# Patient Record
Sex: Female | Born: 1945
Health system: Southern US, Community
[De-identification: ages and names within clinical notes are randomized; demographics above are authoritative.]

## PROBLEM LIST (undated history)

## (undated) DIAGNOSIS — I739 Peripheral vascular disease, unspecified: Secondary | ICD-10-CM

## (undated) DIAGNOSIS — E785 Hyperlipidemia, unspecified: Secondary | ICD-10-CM

## (undated) DIAGNOSIS — E079 Disorder of thyroid, unspecified: Secondary | ICD-10-CM

## (undated) DIAGNOSIS — I2510 Atherosclerotic heart disease of native coronary artery without angina pectoris: Secondary | ICD-10-CM

## (undated) DIAGNOSIS — S32010A Wedge compression fracture of first lumbar vertebra, initial encounter for closed fracture: Secondary | ICD-10-CM

## (undated) DIAGNOSIS — I509 Heart failure, unspecified: Secondary | ICD-10-CM

## (undated) DIAGNOSIS — I6529 Occlusion and stenosis of unspecified carotid artery: Secondary | ICD-10-CM

## (undated) DIAGNOSIS — D126 Benign neoplasm of colon, unspecified: Secondary | ICD-10-CM

## (undated) DIAGNOSIS — K449 Diaphragmatic hernia without obstruction or gangrene: Secondary | ICD-10-CM

## (undated) DIAGNOSIS — D649 Anemia, unspecified: Secondary | ICD-10-CM

## (undated) DIAGNOSIS — B3781 Candidal esophagitis: Secondary | ICD-10-CM

## (undated) DIAGNOSIS — R0902 Hypoxemia: Secondary | ICD-10-CM

## (undated) DIAGNOSIS — I255 Ischemic cardiomyopathy: Secondary | ICD-10-CM

## (undated) DIAGNOSIS — R0600 Dyspnea, unspecified: Secondary | ICD-10-CM

## (undated) DIAGNOSIS — K648 Other hemorrhoids: Secondary | ICD-10-CM

## (undated) DIAGNOSIS — J4489 Other specified chronic obstructive pulmonary disease: Secondary | ICD-10-CM

## (undated) DIAGNOSIS — I219 Acute myocardial infarction, unspecified: Secondary | ICD-10-CM

## (undated) DIAGNOSIS — N3941 Urge incontinence: Secondary | ICD-10-CM

## (undated) DIAGNOSIS — J449 Chronic obstructive pulmonary disease, unspecified: Secondary | ICD-10-CM

## (undated) DIAGNOSIS — I5022 Chronic systolic (congestive) heart failure: Secondary | ICD-10-CM

## (undated) DIAGNOSIS — I10 Essential (primary) hypertension: Secondary | ICD-10-CM

## (undated) DIAGNOSIS — Z86010 Personal history of colonic polyps: Secondary | ICD-10-CM

## (undated) DIAGNOSIS — I480 Paroxysmal atrial fibrillation: Secondary | ICD-10-CM

## (undated) DIAGNOSIS — K222 Esophageal obstruction: Secondary | ICD-10-CM

## (undated) HISTORY — DX: Chronic systolic (congestive) heart failure: I50.22

## (undated) HISTORY — DX: Paroxysmal atrial fibrillation: I48.0

## (undated) HISTORY — DX: Chronic obstructive pulmonary disease, unspecified: J44.9

## (undated) HISTORY — DX: Urge incontinence: N39.41

## (undated) HISTORY — DX: Hypoxemia: R09.02

## (undated) HISTORY — DX: Essential (primary) hypertension: I10

## (undated) HISTORY — DX: Esophageal obstruction: K22.2

## (undated) HISTORY — DX: Atherosclerotic heart disease of native coronary artery without angina pectoris: I25.10

## (undated) HISTORY — DX: Anemia, unspecified: D64.9

## (undated) HISTORY — DX: Candidal esophagitis: B37.81

## (undated) HISTORY — DX: Wedge compression fracture of first lumbar vertebra, initial encounter for closed fracture: S32.010A

## (undated) HISTORY — DX: Benign neoplasm of colon, unspecified: D12.6

## (undated) HISTORY — DX: Heart failure, unspecified: I50.9

## (undated) HISTORY — DX: Hyperlipidemia, unspecified: E78.5

## (undated) HISTORY — DX: Ischemic cardiomyopathy: I25.5

## (undated) HISTORY — DX: Diaphragmatic hernia without obstruction or gangrene: K44.9

## (undated) HISTORY — DX: Other specified chronic obstructive pulmonary disease: J44.89

## (undated) HISTORY — DX: Other hemorrhoids: K64.8

## (undated) HISTORY — DX: Occlusion and stenosis of unspecified carotid artery: I65.29

## (undated) HISTORY — DX: Personal history of colonic polyps: Z86.010

## (undated) HISTORY — PX: BAND HEMORRHOIDECTOMY: SHX1213

## (undated) HISTORY — PX: APPENDECTOMY: SHX54

## (undated) HISTORY — PX: OTHER SURGICAL HISTORY: SHX169

---

## 1974-01-28 HISTORY — PX: ABDOMINAL HYSTERECTOMY: SHX81

## 2007-01-29 DIAGNOSIS — I219 Acute myocardial infarction, unspecified: Secondary | ICD-10-CM

## 2007-01-29 HISTORY — DX: Acute myocardial infarction, unspecified: I21.9

## 2008-10-14 ENCOUNTER — Ambulatory Visit: Payer: Self-pay | Admitting: Diagnostic Radiology

## 2008-10-14 ENCOUNTER — Ambulatory Visit: Payer: Self-pay | Admitting: Cardiology

## 2008-10-14 ENCOUNTER — Encounter: Payer: Self-pay | Admitting: Emergency Medicine

## 2008-10-14 ENCOUNTER — Inpatient Hospital Stay (HOSPITAL_COMMUNITY): Admission: EM | Admit: 2008-10-14 | Discharge: 2008-10-19 | Payer: Self-pay | Admitting: Cardiology

## 2008-10-15 ENCOUNTER — Encounter: Payer: Self-pay | Admitting: Cardiology

## 2008-10-24 ENCOUNTER — Encounter: Payer: Self-pay | Admitting: Cardiology

## 2008-10-28 DIAGNOSIS — E876 Hypokalemia: Secondary | ICD-10-CM | POA: Insufficient documentation

## 2008-10-28 DIAGNOSIS — I429 Cardiomyopathy, unspecified: Secondary | ICD-10-CM

## 2008-10-28 DIAGNOSIS — I2510 Atherosclerotic heart disease of native coronary artery without angina pectoris: Secondary | ICD-10-CM | POA: Insufficient documentation

## 2008-10-28 DIAGNOSIS — I255 Ischemic cardiomyopathy: Secondary | ICD-10-CM | POA: Insufficient documentation

## 2008-11-02 ENCOUNTER — Encounter: Payer: Self-pay | Admitting: Cardiology

## 2008-11-02 ENCOUNTER — Ambulatory Visit: Payer: Self-pay | Admitting: Cardiology

## 2008-11-02 ENCOUNTER — Encounter (INDEPENDENT_AMBULATORY_CARE_PROVIDER_SITE_OTHER): Payer: Self-pay | Admitting: *Deleted

## 2008-11-02 DIAGNOSIS — E7800 Pure hypercholesterolemia, unspecified: Secondary | ICD-10-CM | POA: Insufficient documentation

## 2008-11-02 DIAGNOSIS — D649 Anemia, unspecified: Secondary | ICD-10-CM | POA: Insufficient documentation

## 2008-11-02 DIAGNOSIS — F17200 Nicotine dependence, unspecified, uncomplicated: Secondary | ICD-10-CM | POA: Insufficient documentation

## 2008-11-29 ENCOUNTER — Encounter: Payer: Self-pay | Admitting: Cardiology

## 2008-11-30 ENCOUNTER — Encounter (INDEPENDENT_AMBULATORY_CARE_PROVIDER_SITE_OTHER): Payer: Self-pay | Admitting: *Deleted

## 2008-11-30 LAB — CONVERTED CEMR LAB
ALT: <8 units/L (ref 0–35)
AST: 9 units/L (ref 0–37)
Albumin: 4.1 g/dL (ref 3.5–5.2)
Alkaline Phosphatase: 37 units/L — ABNORMAL LOW (ref 39–117)
BUN: 7 mg/dL (ref 6–23)
Bilirubin, Direct: 0.2 mg/dL (ref 0.0–0.3)
CO2: 31 meq/L (ref 19–32)
Calcium: 9.6 mg/dL (ref 8.4–10.5)
Chloride: 103 meq/L (ref 96–112)
Cholesterol: 131 mg/dL (ref 0–200)
Creatinine, Ser: 0.81 mg/dL (ref 0.40–1.20)
Glucose, Bld: 86 mg/dL (ref 70–99)
HDL: 40 mg/dL (ref >39)
Indirect Bilirubin: 0.5 mg/dL (ref 0.0–0.9)
LDL Cholesterol: 72 mg/dL (ref 0–99)
Potassium: 4.1 meq/L (ref 3.5–5.3)
Sodium: 142 meq/L (ref 135–145)
Total Bilirubin: 0.7 mg/dL (ref 0.3–1.2)
Total CHOL/HDL Ratio: 3.3
Total Protein: 7.6 g/dL (ref 6.0–8.3)
Triglycerides: 94 mg/dL (ref <150)
VLDL: 19 mg/dL (ref 0–40)

## 2008-12-14 ENCOUNTER — Encounter: Payer: Self-pay | Admitting: Cardiology

## 2008-12-14 ENCOUNTER — Ambulatory Visit: Payer: Self-pay | Admitting: Cardiology

## 2008-12-21 ENCOUNTER — Encounter: Payer: Self-pay | Admitting: Cardiology

## 2008-12-26 LAB — CONVERTED CEMR LAB
BUN: 10 mg/dL (ref 6–23)
CO2: 25 meq/L (ref 19–32)
Calcium: 9.5 mg/dL (ref 8.4–10.5)
Chloride: 106 meq/L (ref 96–112)
Creatinine, Ser: 0.81 mg/dL (ref 0.40–1.20)
Glucose, Bld: 85 mg/dL (ref 70–99)
Potassium: 4.7 meq/L (ref 3.5–5.3)
Sodium: 143 meq/L (ref 135–145)

## 2009-02-15 ENCOUNTER — Encounter (INDEPENDENT_AMBULATORY_CARE_PROVIDER_SITE_OTHER): Payer: Self-pay | Admitting: *Deleted

## 2009-02-23 ENCOUNTER — Telehealth: Payer: Self-pay | Admitting: Cardiology

## 2009-03-22 ENCOUNTER — Encounter: Payer: Self-pay | Admitting: Cardiology

## 2009-03-22 ENCOUNTER — Ambulatory Visit: Payer: Self-pay | Admitting: Cardiology

## 2009-03-28 ENCOUNTER — Encounter: Payer: Self-pay | Admitting: Cardiology

## 2009-03-29 ENCOUNTER — Encounter (INDEPENDENT_AMBULATORY_CARE_PROVIDER_SITE_OTHER): Payer: Self-pay | Admitting: *Deleted

## 2009-03-29 LAB — CONVERTED CEMR LAB
ALT: <8 units/L (ref 0–35)
AST: 13 units/L (ref 0–37)
Albumin: 3.8 g/dL (ref 3.5–5.2)
Alkaline Phosphatase: 34 units/L — ABNORMAL LOW (ref 39–117)
BUN: 8 mg/dL (ref 6–23)
Bilirubin, Direct: 0.2 mg/dL (ref 0.0–0.3)
CO2: 31 meq/L (ref 19–32)
Calcium: 9.1 mg/dL (ref 8.4–10.5)
Chloride: 104 meq/L (ref 96–112)
Cholesterol: 125 mg/dL (ref 0–200)
Creatinine, Ser: 0.76 mg/dL (ref 0.40–1.20)
Glucose, Bld: 78 mg/dL (ref 70–99)
HDL: 40 mg/dL (ref >39)
Indirect Bilirubin: 0.5 mg/dL (ref 0.0–0.9)
LDL Cholesterol: 71 mg/dL (ref 0–99)
Potassium: 4.2 meq/L (ref 3.5–5.3)
Sodium: 143 meq/L (ref 135–145)
Total Bilirubin: 0.7 mg/dL (ref 0.3–1.2)
Total CHOL/HDL Ratio: 3.1
Total Protein: 7.3 g/dL (ref 6.0–8.3)
Triglycerides: 69 mg/dL (ref <150)
VLDL: 14 mg/dL (ref 0–40)

## 2009-04-11 ENCOUNTER — Ambulatory Visit: Payer: Self-pay | Admitting: Cardiology

## 2009-04-11 ENCOUNTER — Encounter: Payer: Self-pay | Admitting: Cardiology

## 2009-04-11 ENCOUNTER — Ambulatory Visit: Payer: Self-pay

## 2009-04-11 ENCOUNTER — Ambulatory Visit (HOSPITAL_COMMUNITY): Admission: RE | Admit: 2009-04-11 | Discharge: 2009-04-11 | Payer: Self-pay | Admitting: Cardiology

## 2009-05-25 ENCOUNTER — Ambulatory Visit: Payer: Self-pay | Admitting: Internal Medicine

## 2009-05-25 DIAGNOSIS — I5022 Chronic systolic (congestive) heart failure: Secondary | ICD-10-CM

## 2009-05-25 DIAGNOSIS — I5032 Chronic diastolic (congestive) heart failure: Secondary | ICD-10-CM | POA: Insufficient documentation

## 2009-09-20 ENCOUNTER — Ambulatory Visit: Payer: Self-pay | Admitting: Cardiology

## 2009-09-20 ENCOUNTER — Encounter: Payer: Self-pay | Admitting: Cardiology

## 2009-09-20 DIAGNOSIS — J441 Chronic obstructive pulmonary disease with (acute) exacerbation: Secondary | ICD-10-CM | POA: Insufficient documentation

## 2009-09-20 DIAGNOSIS — J449 Chronic obstructive pulmonary disease, unspecified: Secondary | ICD-10-CM | POA: Insufficient documentation

## 2009-09-27 ENCOUNTER — Encounter: Payer: Self-pay | Admitting: Cardiology

## 2009-09-29 ENCOUNTER — Encounter (INDEPENDENT_AMBULATORY_CARE_PROVIDER_SITE_OTHER): Payer: Self-pay | Admitting: *Deleted

## 2009-09-29 LAB — CONVERTED CEMR LAB
ALT: <8 units/L (ref 0–35)
AST: 11 units/L (ref 0–37)
Albumin: 3.7 g/dL (ref 3.5–5.2)
Alkaline Phosphatase: 37 units/L — ABNORMAL LOW (ref 39–117)
BUN: 5 mg/dL — ABNORMAL LOW (ref 6–23)
Bilirubin, Direct: 0.2 mg/dL (ref 0.0–0.3)
CO2: 33 meq/L — ABNORMAL HIGH (ref 19–32)
Calcium: 9 mg/dL (ref 8.4–10.5)
Chloride: 100 meq/L (ref 96–112)
Cholesterol: 113 mg/dL (ref 0–200)
Creatinine, Ser: 0.73 mg/dL (ref 0.40–1.20)
Glucose, Bld: 80 mg/dL (ref 70–99)
HDL: 32 mg/dL — ABNORMAL LOW (ref >39)
Indirect Bilirubin: 0.5 mg/dL (ref 0.0–0.9)
LDL Cholesterol: 66 mg/dL (ref 0–99)
Potassium: 4 meq/L (ref 3.5–5.3)
Sodium: 143 meq/L (ref 135–145)
Total Bilirubin: 0.7 mg/dL (ref 0.3–1.2)
Total CHOL/HDL Ratio: 3.5
Total Protein: 7.1 g/dL (ref 6.0–8.3)
Triglycerides: 76 mg/dL (ref <150)
VLDL: 15 mg/dL (ref 0–40)

## 2010-02-27 NOTE — Letter (Signed)
Summary: Hydrographic surveyor at [facility]  [address]   [place], [place] [address]   Phone: [phone]  Fax:      November 30, 2008 MRN: [id number]   [facility] [address] [place], [place]  [address]   Dear Tonya Harper,  We have reviewed your cholesterol results.  They are as follows:     Total Cholesterol:    131 (Desirable: less than 200)       HDL  Cholesterol:     40  (Desirable: greater than 40 for men and 50 for women)       LDL Cholesterol:       72  (Desirable: less than 100 for low risk and less than 70 for moderate to high risk)       Triglycerides:       94  (Desirable: less than 150)  Our recommendations include:These numbers look good. Continue on the same medicine. Sodium, potassium kidney and Liver function are normal. Take care, Dr. Darel Hong.    Call our office at the number listed above if you have any questions.  Lowering your LDL cholesterol is important, but it is only one of a large number of "risk factors" that may indicate that you are at risk for heart disease, stroke or other complications of hardening of the arteries.  Other risk factors include:   A.  Cigarette Smoking* B.  High Blood Pressure* C.  Obesity* D.   Low HDL Cholesterol (see yours above)* E.   Diabetes Mellitus (higher risk if your is uncontrolled) F.  Family history of premature heart disease G.  Previous history of stroke or cardiovascular disease    *These are risk factors YOU HAVE CONTROL OVER.  For more information, visit .  There is now evidence that lowering the TOTAL CHOLESTEROL AND LDL CHOLESTEROL can reduce the risk of heart disease.  The American Heart Association recommends the following guidelines for the treatment of elevated cholesterol:  1.  If there is now current heart disease and less than two risk factors, TOTAL CHOLESTEROL should be less than 200 and LDL CHOLESTEROL should be less than 100. 2.  If there is current heart  disease or two or more risk factors, TOTAL CHOLESTEROL should be less than 200 and LDL CHOLESTEROL should be less than 70.  A diet low in cholesterol, saturated fat, and calories is the cornerstone of treatment for elevated cholesterol.  Cessation of smoking and exercise are also important in the management of elevated cholesterol and preventing vascular disease.  Studies have shown that 30 to 60 minutes of physical activity most days can help lower blood pressure, lower cholesterol, and keep your weight at a healthy level.  Drug therapy is used when cholesterol levels do not respond to therapeutic lifestyle changes (smoking cessation, diet, and exercise) and remains unacceptably high.  If medication is started, it is important to have you levels checked periodically to evaluate the need for further treatment options.  Thank you,    [employer] Team

## 2010-02-27 NOTE — Letter (Signed)
Summary: Custom - Lipid  [facility], Main Office  [address]   [place], [place] [address]   Phone: [phone]  Fax: [phone]     September 29, 2009 MRN: [id number]   [facility] [address] [place], [place]  [address]   Dear Tonya Harper,  We have reviewed your cholesterol results.  They are as follows:     Total Cholesterol:    113 (Desirable: less than 200)       HDL  Cholesterol:     32  (Desirable: greater than 40 for men and 50 for women)       LDL Cholesterol:       66  (Desirable: less than 100 for low risk and less than 70 for moderate to high risk)       Triglycerides:       76  (Desirable: less than 150)  Our recommendations include:These numbers look good. Continue on the same medicine. Sodium, potassium, kidney and Liver function are normal. Take care, Dr. Darel Hong.    Call our office at the number listed above if you have any questions.  Lowering your LDL cholesterol is important, but it is only one of a large number of "risk factors" that may indicate that you are at risk for heart disease, stroke or other complications of hardening of the arteries.  Other risk factors include:   A.  Cigarette Smoking* B.  High Blood Pressure* C.  Obesity* D.   Low HDL Cholesterol (see yours above)* E.   Diabetes Mellitus (higher risk if your is uncontrolled) F.  Family history of premature heart disease G.  Previous history of stroke or cardiovascular disease    *These are risk factors YOU HAVE CONTROL OVER.  For more information, visit .  There is now evidence that lowering the TOTAL CHOLESTEROL AND LDL CHOLESTEROL can reduce the risk of heart disease.  The American Heart Association recommends the following guidelines for the treatment of elevated cholesterol:  1.  If there is now current heart disease and less than two risk factors, TOTAL CHOLESTEROL should be less than 200 and LDL CHOLESTEROL should be less than 100. 2.  If there  is current heart disease or two or more risk factors, TOTAL CHOLESTEROL should be less than 200 and LDL CHOLESTEROL should be less than 70.  A diet low in cholesterol, saturated fat, and calories is the cornerstone of treatment for elevated cholesterol.  Cessation of smoking and exercise are also important in the management of elevated cholesterol and preventing vascular disease.  Studies have shown that 30 to 60 minutes of physical activity most days can help lower blood pressure, lower cholesterol, and keep your weight at a healthy level.  Drug therapy is used when cholesterol levels do not respond to therapeutic lifestyle changes (smoking cessation, diet, and exercise) and remains unacceptably high.  If medication is started, it is important to have you levels checked periodically to evaluate the need for further treatment options.  Thank you,    [employer] Team

## 2010-02-27 NOTE — Letter (Signed)
Summary: Custom - Lipid  [facility], Main Office  [address]   [place], [place] [address]   Phone: [phone]  Fax: [phone]     March 29, 2009 MRN: [id number]   [facility] [address] [place], [place]  [address]   Dear Ms. Kotter,  We have reviewed your cholesterol results.  They are as follows:     Total Cholesterol:    125 (Desirable: less than 200)       HDL  Cholesterol:     40  (Desirable: greater than 40 for men and 50 for women)       LDL Cholesterol:       71  (Desirable: less than 100 for low risk and less than 70 for moderate to high risk)       Triglycerides:       69  (Desirable: less than 150)  Our recommendations include:These numbers look good. Continue on the same medicine. Sodium, potassium, kidney and Liver function are normal. Take care, Dr. Darel Hong.    Call our office at the number listed above if you have any questions.  Lowering your LDL cholesterol is important, but it is only one of a large number of "risk factors" that may indicate that you are at risk for heart disease, stroke or other complications of hardening of the arteries.  Other risk factors include:   A.  Cigarette Smoking* B.  High Blood Pressure* C.  Obesity* D.   Low HDL Cholesterol (see yours above)* E.   Diabetes Mellitus (higher risk if your is uncontrolled) F.  Family history of premature heart disease G.  Previous history of stroke or cardiovascular disease    *These are risk factors YOU HAVE CONTROL OVER.  For more information, visit .  There is now evidence that lowering the TOTAL CHOLESTEROL AND LDL CHOLESTEROL can reduce the risk of heart disease.  The American Heart Association recommends the following guidelines for the treatment of elevated cholesterol:  1.  If there is now current heart disease and less than two risk factors, TOTAL CHOLESTEROL should be less than 200 and LDL CHOLESTEROL should be less than 100. 2.  If there is  current heart disease or two or more risk factors, TOTAL CHOLESTEROL should be less than 200 and LDL CHOLESTEROL should be less than 70.  A diet low in cholesterol, saturated fat, and calories is the cornerstone of treatment for elevated cholesterol.  Cessation of smoking and exercise are also important in the management of elevated cholesterol and preventing vascular disease.  Studies have shown that 30 to 60 minutes of physical activity most days can help lower blood pressure, lower cholesterol, and keep your weight at a healthy level.  Drug therapy is used when cholesterol levels do not respond to therapeutic lifestyle changes (smoking cessation, diet, and exercise) and remains unacceptably high.  If medication is started, it is important to have you levels checked periodically to evaluate the need for further treatment options.  Thank you,    [employer] Team

## 2010-02-27 NOTE — Progress Notes (Signed)
Summary: refill  Phone Note Refill Request Message from:  Patient on February 23, 2009 10:33 AM  Refills Requested: Medication #1:  CARVEDILOL 6.25 MG TABS Take one tablet by mouth twice a day   Supply Requested: 3 months   Notes: 90 day supply [facility], please resend with 90 day supply   Method Requested: Fax to Local Pharmacy Initial call taken by: Migdalia Dk,  February 23, 2009 10:35 AM  Follow-up for Phone Call        Called Pharm. Ms. Buttram has 3 refills on hold. I called her home and left a message to pick up her scripts. Follow-up by: Oswald Hillock,  February 23, 2009 11:38 AM

## 2010-02-27 NOTE — Assessment & Plan Note (Signed)
Summary: [facility]   Visit Type:  2 months follow up  CC:  No complains.  History of Present Illness: Pleasant female with coronary artery disease and cardiomyopathy.  A cardiac catheterization was performed on September 21 of 2010. This revealed left main with 50% eccentric distal stenosis, left anterior descending coronary artery 60% mid tubular lesion (does not appear to be flow limiting), circumflex occluded in its mid portion and distal circ appears to fill from septal collaterals, right coronary artery appeared to be a small and likely nondominant, subtotally occluded in the mid vessel with faint filling of the distal vessel.  Left ventricular ejection fraction in the 40% range.  Medical management recommended. An echocardiogram during that admission revealed an ejection fraction of 20-25%, mild aortic and mitral regurgitation and right ventricular enlargement. She also had a normocytic anemia. I last saw her in November of 2010. Since then the patient has dyspnea with more extreme activities but not with routine activities. It is relieved with rest. It is not associated with chest pain. There is no orthopnea, PND or pedal edema. There is no syncope or palpitations. There is no exertional chest pain.   Current Medications (verified): 1)  Ferrous Sulfate 325 (65 Fe) Mg Tabs (Ferrous Sulfate) .... Take 1 Tablet By Mouth Once A Day 2)  Nitrostat 0.4 Mg Subl (Nitroglycerin) .Marland Kitchen.. 1 Tablet Under Tongue At Onset of Chest Pain; You May Repeat Every 5 Minutes For Up To 3 Doses. 3)  Tylenol 325 Mg Tabs (Acetaminophen) .... As Needed 4)  Aspirin 81 Mg Tbec (Aspirin) .... Take One Tablet By Mouth Daily 5)  Carvedilol 6.25 Mg Tabs (Carvedilol) .... Take One Tablet By Mouth Twice A Day 6)  Lisinopril 20 Mg Tabs (Lisinopril) .... Take One and One Half Tablet By Mouth Once Daily 7)  Pravastatin Sodium 20 Mg Tabs (Pravastatin Sodium) .... Take One Tablet By Mouth Daily At Bedtime  Allergies: 1)  !  * Asa High Doses  Past History:  Past Medical History: Current Problems:  CARDIOMYOPATHY (ICD-425.4) CAD (ICD-414.00) Hyperlipidemia Anemia with H and H at a low of 9.6 and 29.   Past Surgical History: Reviewed history from 10/28/2008 and no changes required.  Hysterectomy,  benign tumor removed from her  back.      Social History: Reviewed history from 10/28/2008 and no changes required.  The patient lives with her son.  She has been smoking   for 35 years and currently smokes a half-pack per day.  She rarely   drinks alcohol.   Review of Systems       no fevers or chills, productive cough, hemoptysis, dysphasia, odynophagia, melena, hematochezia, dysuria, hematuria, rash, seizure activity, orthopnea, PND, pedal edema, claudication. Remaining systems are negative.   Vital Signs:  Patient profile:   65 year old female Height:      64 inches Weight:      81.75 pounds BMI:     14.08 Pulse rate:   62 / minute Pulse rhythm:   regular Resp:     18 per minute BP sitting:   124 / 64  (left arm) Cuff size:   regular  Vitals Entered By: Vikki Ports (March 22, 2009 9:50 AM)  Physical Exam  General:  Well-developed well-nourished in no acute distress.  Skin is warm and dry.  HEENT is normal.  Neck is supple. No thyromegaly.  Chest is clear to auscultation with normal expansion.  Cardiovascular exam is regular rate and rhythm.  Abdominal exam nontender  or distended. No masses palpated. Extremities show no edema. neuro grossly intact    EKG  Procedure date:  03/22/2009  Findings:      Sinus rhythm at a rate of 61. Axis normal. Prolonged QT interval. Inferior T-wave inversion.  Impression & Recommendations:  Problem # 1:  CARDIOMYOPATHY (ICD-425.4) I will plan to increase her lisinopril to 40 mg p.o. daily. Continue Coreg. Check bmet in one week. Repeat echocardiogram in 6 weeks. If ejection fraction less than 35% will need ICD. Her updated medication list  for this problem includes:    Nitrostat 0.4 Mg Subl (Nitroglycerin) .Marland Kitchen... 1 tablet under tongue at onset of chest pain; you may repeat every 5 minutes for up to 3 doses.    Aspirin 81 Mg Tbec (Aspirin) .Marland Kitchen... Take one tablet by mouth daily    Carvedilol 6.25 Mg Tabs (Carvedilol) .Marland Kitchen... Take one tablet by mouth twice a day    Lisinopril 20 Mg Tabs (Lisinopril) .Marland Kitchen... Take one and one half tablet by mouth once daily  Problem # 2:  TOBACCO ABUSE (ICD-305.1) Patient counseled on discontinuing for between 3-10 minutes.  Problem # 3:  PURE HYPERCHOLESTEROLEMIA (ICD-272.0) Continue statin. Check lipids and liver. Goal LDL less than 70. Her updated medication list for this problem includes:    Pravastatin Sodium 20 Mg Tabs (Pravastatin sodium) .Marland Kitchen... Take one tablet by mouth daily at bedtime  Problem # 4:  CAD (ICD-414.00) Continue aspirin, beta blocker, ACE inhibitor and statin. Her updated medication list for this problem includes:    Nitrostat 0.4 Mg Subl (Nitroglycerin) .Marland Kitchen... 1 tablet under tongue at onset of chest pain; you may repeat every 5 minutes for up to 3 doses.    Aspirin 81 Mg Tbec (Aspirin) .Marland Kitchen... Take one tablet by mouth daily    Carvedilol 6.25 Mg Tabs (Carvedilol) .Marland Kitchen... Take one tablet by mouth twice a day    Lisinopril 40 Mg Tabs (Lisinopril) .Marland Kitchen... Take one tablet by mouth daily  Problem # 5:  OTHER SPECIFIED ANEMIAS (ICD-285.8) I have asked her to follow up with her primary care physician for further evaluation of this. She states this is a chronic issue.  Other Orders: Echocardiogram (Echo)  Patient Instructions: 1)  Your physician recommends that you schedule a follow-up appointment in: 6 MONTHS 2)  Your physician recommends that you return for lab work in:ONE WEEK 3)  Your physician has requested that you have an echocardiogram.  Echocardiography is a painless test that uses sound waves to create images of your heart. It provides your doctor with information about the size  and shape of your heart and how well your heart's chambers and valves are working.  This procedure takes approximately one hour. There are no restrictions for this procedure. 4)  Your physician has recommended you make the following change in your medication: INCREASE LISINOPRIL 40MG  ONCE DAILY Prescriptions: LISINOPRIL 40 MG TABS (LISINOPRIL) Take one tablet by mouth daily  #90 x 4   Entered by:   Deliah Goody, RN   Authorized by:   Ferman Hamming, MD, [facility]   Signed by:   Deliah Goody, RN on 03/22/2009   Method used:   [facility] to        [facility] [phone][facility] (retail)       [address]       [place], [place]  [address]       Ph: [phone] or [phone]  Fax: [phone]   RxID:   [id number] LISINOPRIL 40 MG TABS (LISINOPRIL) Take one tablet by mouth daily  #30 x 12   Entered by:   Deliah Goody, RN   Authorized by:   Ferman Hamming, MD, [facility]   Signed by:   Deliah Goody, RN on 03/22/2009   Method used:   [facility]ron[facility] to        [facility] [phone]* (retail)       [address]       [place], [place]  [address]       Ph: [phone] or [phone]       Fax: [phone]   RxID:   [id number]

## 2010-02-27 NOTE — Assessment & Plan Note (Signed)
Summary: [facility]   Visit Type:  6 weeks follow up  CC:  No complains.  History of Present Illness: 65 year old female with coronary artery disease and cardiomyopathy.  A cardiac catheterization was performed on September 21 of 2010. This revealed left main with 50% eccentric distal stenosis, left anterior descending coronary artery 60% mid tubular lesion (does not appear to be flow limiting), circumflex occluded in its mid portion and distal circ appears to fill from septal collaterals, right coronary artery appeared to be a small and likely nondominant, subtotally occluded in the mid vessel with faint filling of the distal vessel.  Left ventricular ejection fraction in the 40% range.  Medical management recommended. An echocardiogram during that admission revealed an ejection fraction of 20-25%, mild aortic and mitral regurgitation and right ventricular enlargement. She also had a normocytic anemia. I last saw her in early October. Since then she has dyspnea with more extreme activities but not with routine activities. It is relieved with rest. It is not associated with chest pain. There is no orthopnea, PND, pedal edema, palpitations, syncope or exertional chest pain.  Current Medications (verified): 1)  Ferrous Sulfate 325 (65 Fe) Mg Tabs (Ferrous Sulfate) .... Take 1 Tablet By Mouth Once A Day 2)  Nitrostat 0.4 Mg Subl (Nitroglycerin) .Marland Kitchen.. 1 Tablet Under Tongue At Onset of Chest Pain; You May Repeat Every 5 Minutes For Up To 3 Doses. 3)  Tylenol 325 Mg Tabs (Acetaminophen) .... As Needed 4)  Aspirin 81 Mg Tbec (Aspirin) .... Take One Tablet By Mouth Daily 5)  Carvedilol 3.125 Mg Tabs (Carvedilol) .... Take One Tablet By Mouth Twice A Day 6)  Lisinopril 20 Mg Tabs (Lisinopril) .... Take One Tablet By Mouth Daily 7)  Pravastatin Sodium 20 Mg Tabs (Pravastatin Sodium) .... Take One Tablet By Mouth Daily At Bedtime  Allergies (verified): 1)  ! * Asa High Doses  Past History:  Past  Medical History: Current Problems:  CARDIOMYOPATHY (ICD-425.4) CAD (ICD-414.00) Hyperlipidemia  Bronchitis, treated with IV azithromycin.  Anemia with H and H at a low of 9.6 and 29.   Past Surgical History: Reviewed history from 10/28/2008 and no changes required.  Hysterectomy,  benign tumor removed from her  back.      Social History: Reviewed history from 10/28/2008 and no changes required.  The patient lives with her son.  She has been smoking   for 35 years and currently smokes a half-pack per day.  She rarely   drinks alcohol.   Review of Systems       no fevers or chills, productive cough, hemoptysis, dysphasia, odynophagia, melena, hematochezia, dysuria, hematuria, rash, seizure activity, orthopnea, PND, pedal edema, claudication. Remaining systems are negative.   Vital Signs:  Patient profile:   64 year old female Height:      64 inches Weight:      77 pounds BMI:     13.26 Pulse rate:   64 / minute Pulse rhythm:   regular Resp:     18 per minute BP sitting:   130 / 70  (right arm) Cuff size:   regular  Vitals Entered By: Vikki Ports (December 14, 2008 10:41 AM)  Physical Exam  General:  well-developed frail in no acute distress. Skin is warm and dry. Head:  HEENT is normal. Neck:  supple with no bruits. No thyromegaly. Lungs:  diminished breath sounds throughout Heart:  regular rate and rhythm Abdomen:  soft and nontender. No masses palpated. Extremities:  no edema. Neurologic:  grossly intact.   Impression & Recommendations:  Problem # 1:  TOBACCO ABUSE (ICD-305.1) Patient counseled as continuing for between 3-10 minutes.  Problem # 2:  PURE HYPERCHOLESTEROLEMIA (ICD-272.0) Continue statin. Her updated medication list for this problem includes:    Pravastatin Sodium 20 Mg Tabs (Pravastatin sodium) .Marland Kitchen... Take one tablet by mouth daily at bedtime  Problem # 3:  CARDIOMYOPATHY (ICD-425.4) Plan will continue to titrate medications. Increase  lisinopril to 30 mg p.o. daily and Coreg to 6.25 mg p.o. b.i.d. I will continue to increase medications and once fully titrated we will plan to repeat echocardiogram. Check bmet in one week after increasing lisinopril. Her updated medication list for this problem includes:    Nitrostat 0.4 Mg Subl (Nitroglycerin) .Marland Kitchen... 1 tablet under tongue at onset of chest pain; you may repeat every 5 minutes for up to 3 doses.    Aspirin 81 Mg Tbec (Aspirin) .Marland Kitchen... Take one tablet by mouth daily    Carvedilol 6.25 Mg Tabs (Carvedilol) .Marland Kitchen... Take one tablet by mouth twice a day    Lisinopril 20 Mg Tabs (Lisinopril) .Marland Kitchen... Take one and one half tablet by mouth once daily  Problem # 5:  CAD (ICD-414.00) Continue aspirin, beta blocker, ACE inhibitor and statin. Her updated medication list for this problem includes:    Nitrostat 0.4 Mg Subl (Nitroglycerin) .Marland Kitchen... 1 tablet under tongue at onset of chest pain; you may repeat every 5 minutes for up to 3 doses.    Aspirin 81 Mg Tbec (Aspirin) .Marland Kitchen... Take one tablet by mouth daily    Carvedilol 6.25 Mg Tabs (Carvedilol) .Marland Kitchen... Take one tablet by mouth twice a day    Lisinopril 20 Mg Tabs (Lisinopril) .Marland Kitchen... Take one and one half tablet by mouth once daily  Problem # 6:  OTHER SPECIFIED ANEMIAS (ICD-285.8) Management per primary care.  Patient Instructions: 1)  Your physician recommends that you schedule a follow-up appointment in: 8 WEEKS 2)  Your physician recommends that you return for lab work in:ONE WEEK 3)  Your physician has recommended you make the following change in your medication: INCREASE LISINOPRIL 20MG  TAKE ONE AND ONE HALF TABLET ONCE DAILY 4)  INCREASE CARVEDILOL 6.25MG  ONE TABLET TWICE DAILY Prescriptions: PRAVASTATIN SODIUM 20 MG TABS (PRAVASTATIN SODIUM) Take one tablet by mouth daily at bedtime  #90 x 3   Entered by:   Deliah Goody, RN   Authorized by:   Ferman Hamming, MD, [facility]   Signed by:   Deliah Goody, RN on 12/14/2008   Method used:    [facility]ron[facility] to        [facility] [phone]* (retail)       [address]       [place], [place]  [address]       Ph: [phone] or [phone]       Fax: [phone]   RxID:   [id number] CARVEDILOL 6.25 MG TABS (CARVEDILOL) Take one tablet by mouth twice a day  #60 x 12   Entered by:   Deliah Goody, RN   Authorized by:   Ferman Hamming, MD, [facility]   Signed by:   Deliah Goody, RN on 12/14/2008   Method used:   Electronically to        [facility] [phone]* (retail)       [address]       [facility]       [place], [place]  [address]       Ph: [phone] or [phone]       Fax: [phone]   RxID:   [id number] LISINOPRIL 20 MG TABS (LISINOPRIL) Take one and one half tablet by mouth once daily  #45 x 12   Entered by:   Deliah Goody, RN   Authorized by:   Ferman Hamming, MD, [facility]   Signed by:   Deliah Goody, RN on 12/14/2008   Method used:   [facility]ronically to        [facility] [phone]* (retail)       [address]       [place], [place]  [address]       Ph: [phone] or [phone]       Fax: [phone]   RxID:   [id number]

## 2010-02-27 NOTE — Assessment & Plan Note (Signed)
Summary: ec6/EF%20-25/discuss ICD/dm   Visit Type:  Follow-up Primary Provider:  Dr. Lawerance Bach  CC:  fatigue with exertion.  History of Present Illness: Tonya Harper is referred today by Dr. Jens Som for evaluation and consideration for ICD implant.  She has a h/o CAD and LV dysfunction and class 2 CHF.  The patient has never had frank syncope but she does not dyspnea with exertion without peripheral edema.  No other complaints.  She still smokes.  She has tried to stop.   Current Medications (verified): 1)  Ferrous Sulfate 325 (65 Fe) Mg Tabs (Ferrous Sulfate) .... Take 1 Tablet By Mouth Once A Day 2)  Nitrostat 0.4 Mg Subl (Nitroglycerin) .Marland Kitchen.. 1 Tablet Under Tongue At Onset of Chest Pain; You May Repeat Every 5 Minutes For Up To 3 Doses. 3)  Tylenol 325 Mg Tabs (Acetaminophen) .... As Needed 4)  Aspirin 81 Mg Tbec (Aspirin) .... Take One Tablet By Mouth Daily 5)  Carvedilol 6.25 Mg Tabs (Carvedilol) .... Take One Tablet By Mouth Twice A Day 6)  Lisinopril 40 Mg Tabs (Lisinopril) .... Take One Tablet By Mouth Daily 7)  Pravastatin Sodium 20 Mg Tabs (Pravastatin Sodium) .... Take One Tablet By Mouth Daily At Bedtime  Allergies (verified): 1)  ! * Asa High Doses  Past History:  Past Medical History: Last updated: 03/22/2009 Current Problems:  CARDIOMYOPATHY (ICD-425.4) CAD (ICD-414.00) Hyperlipidemia Anemia with H and H at a low of 9.6 and 29.   Past Surgical History: Last updated: 10/28/2008  Hysterectomy,  benign tumor removed from her  back.      Family History: Last updated: 10/28/2008 remarkable for her father dying suddenly, presumably   of a myocardial infarction at age 63.  Two brothers have had coronary   disease at age 1.   Social History: Last updated: 10/28/2008  The patient lives with her son.  She has been smoking   for 35 years and currently smokes a half-pack per day.  She rarely   drinks alcohol.   Review of Systems       All systems reviewed and  negative except as noted in the HPI.  Vital Signs:  Patient profile:   65 year old female Height:      64 inches Weight:      81 pounds BMI:     13.95 Pulse rate:   68 / minute BP sitting:   154 / 70  Vitals Entered By: Laurance Flatten CMA (May 25, 2009 10:00 AM) CC: fatigue with exertion Comments pt has had cough since September about same time fatigue started.   Physical Exam  General:  Well-developed well-nourished in no acute distress.  Skin is warm and dry.  HEENT is normal.  Neck is supple. No thyromegaly.  Chest is clear to auscultation with normal expansion.  Cardiovascular exam is regular rate and rhythm.  Abdominal exam nontender or distended. No masses palpated. Extremities show no edema. neuro grossly intact    EKG  Procedure date:  03/22/2009  Findings:      Normal sinus rhythm with rate of:64.  Left ventricular hypertrophy.    Impression & Recommendations:  Problem # 1:  CAD (ICD-414.00) The patient denies c/p and will continue her current meds. Her updated medication list for this problem includes:    Nitrostat 0.4 Mg Subl (Nitroglycerin) .Marland Kitchen... 1 tablet under tongue at onset of chest pain; you may repeat every 5 minutes for up to 3 doses.    Aspirin 81 Mg Tbec (Aspirin) .Marland KitchenMarland KitchenMarland KitchenMarland Kitchen  Take one tablet by mouth daily    Carvedilol 6.25 Mg Tabs (Carvedilol) .Marland Kitchen... Take one tablet by mouth twice a day    Lisinopril 40 Mg Tabs (Lisinopril) .Marland Kitchen... Take one tablet by mouth daily  Problem # 2:  CHRONIC SYSTOLIC HEART FAILURE (ICD-428.22) Her symptoms are class 2 and along with  her EF of 25%, I would recommend proceeding with prophylactic ICD insertion. Her updated medication list for this problem includes:    Nitrostat 0.4 Mg Subl (Nitroglycerin) .Marland Kitchen... 1 tablet under tongue at onset of chest pain; you may repeat every 5 minutes for up to 3 doses.    Aspirin 81 Mg Tbec (Aspirin) .Marland Kitchen... Take one tablet by mouth daily    Carvedilol 6.25 Mg Tabs (Carvedilol) .Marland Kitchen... Take one  tablet by mouth twice a day    Lisinopril 40 Mg Tabs (Lisinopril) .Marland Kitchen... Take one tablet by mouth daily

## 2010-02-27 NOTE — Assessment & Plan Note (Signed)
Summary: [facility]   Visit Type:  Follow-up Primary Provider:  Dr. Lawerance Bach  CC:  No complains.  History of Present Illness: Pleasant female with coronary artery disease and cardiomyopathy.  A cardiac catheterization was performed on September 21 of 2010. This revealed left main with 50% eccentric distal stenosis, left anterior descending coronary artery 60% mid tubular lesion (does not appear to be flow limiting), circumflex occluded in its mid portion and distal circ appears to fill from septal collaterals, right coronary artery appeared to be a small and likely nondominant, subtotally occluded in the mid vessel with faint filling of the distal vessel.  Left ventricular ejection fraction in the 40% range.  Medical management recommended. An echocardiogram during that admission revealed an ejection fraction of 20-25%, mild aortic and mitral regurgitation and right ventricular enlargement. She also had a normocytic anemia. Repeat echocardiogram in March of 2011 following titration of medications revealed an ejection fraction of 20-25% and mild to moderate aortic insufficiency. She was seen by Dr. Ladona Ridgel and ICD recommended. I last saw her in Feb 2011. Since then the patient has dyspnea with more extreme activities but not with routine activities. It is relieved with rest. It is not associated with chest pain. There is no orthopnea, PND or pedal edema. There is no syncope or palpitations. There is no exertional chest pain. She did have an episode of what sounds to be bronchitis for approximately one week. She had a productive cough and continuous chest tightness for that period.   Current Medications (verified): 1)  Ferrous Sulfate 325 (65 Fe) Mg Tabs (Ferrous Sulfate) .... Take 1 Tablet By Mouth Once A Day 2)  Nitrostat 0.4 Mg Subl (Nitroglycerin) .Marland Kitchen.. 1 Tablet Under Tongue At Onset of Chest Pain; You May Repeat Every 5 Minutes For Up To 3 Doses. 3)  Tylenol 325 Mg Tabs (Acetaminophen) .... As  Needed 4)  Aspirin 81 Mg Tbec (Aspirin) .... Take One Tablet By Mouth Daily 5)  Carvedilol 6.25 Mg Tabs (Carvedilol) .... Take One Tablet By Mouth Twice A Day 6)  Lisinopril 40 Mg Tabs (Lisinopril) .... Take One Tablet By Mouth Daily 7)  Pravastatin Sodium 20 Mg Tabs (Pravastatin Sodium) .... Take One Tablet By Mouth Daily At Bedtime  Allergies: 1)  ! * Asa High Doses  Past History:  Past Medical History: Ischemic cardiomyopathy CAD (ICD-414.00) Hyperlipidemia Anemia with H and H at a low of 9.6 and 29.   Past Surgical History: Reviewed history from 10/28/2008 and no changes required.  Hysterectomy,  benign tumor removed from her  back.      Social History: Reviewed history from 10/28/2008 and no changes required.  The patient lives with her son.  She has been smoking   for 35 years and currently smokes a half-pack per day.  She rarely   drinks alcohol.   Review of Systems       no fevers or chills, productive cough, hemoptysis, dysphasia, odynophagia, melena, hematochezia, dysuria, hematuria, rash, seizure activity, orthopnea, PND, pedal edema, claudication. Remaining systems are negative.   Vital Signs:  Patient profile:   65 year old female Height:      64 inches Weight:      80 pounds BMI:     13.78 Pulse rate:   59 / minute Pulse rhythm:   regular Resp:     18 per minute BP sitting:   130 / 70  (left arm) Cuff size:   small  Vitals Entered By: Vikki Ports (September 20, 2009 10:52 AM)  Physical Exam  General:  Well-developed frail in no acute distress.  Skin is warm and dry.  HEENT is normal.  Neck is supple. No thyromegaly.  Chest is clear to auscultation with normal expansion.  Cardiovascular exam is regular rate and rhythm.  Abdominal exam nontender or distended. No masses palpated. Extremities show no edema. neuro grossly intact    EKG  Procedure date:  09/20/2009  Findings:      Sinus bradycardia at a rate of 59. Prolonged QT interval.  Nonspecific T-wave changes. Cannot rule out prior inferior infarct.  Impression & Recommendations:  Problem # 1:  TOBACCO ABUSE (ICD-305.1) Patient counseled on discontinuing.  Problem # 2:  PURE HYPERCHOLESTEROLEMIA (ICD-272.0) Continue statin. Check lipids and liver. Her updated medication list for this problem includes:    Pravastatin Sodium 20 Mg Tabs (Pravastatin sodium) .Marland Kitchen... Take one tablet by mouth daily at bedtime  Orders: T-Basic Metabolic Panel [id number]) T-Lipid Profile [id number])  Problem # 3:  CARDIOMYOPATHY (ICD-425.4) Patient has an ischemic cardiomyopathy. Continue aspirin, blocker, ACE inhibitor and statin. She has been seen by Dr. Ladona Ridgel and ICD recommended. She was hesitant but now states she will consider. She is not agreeable to making an appointment today. She states that she will contact us when she is ready to proceed. She understands the risk of sudden death. Her updated medication list for this problem includes:    Nitrostat 0.4 Mg Subl (Nitroglycerin) .Marland Kitchen... 1 tablet under tongue at onset of chest pain; you may repeat every 5 minutes for up to 3 doses.    Aspirin 81 Mg Tbec (Aspirin) .Marland Kitchen... Take one tablet by mouth daily    Carvedilol 6.25 Mg Tabs (Carvedilol) .Marland Kitchen... Take one tablet by mouth twice a day    Lisinopril 40 Mg Tabs (Lisinopril) .Marland Kitchen... Take one tablet by mouth daily  Problem # 4:  CAD (ICD-414.00) Continue aspirin, beta blocker, ACE inhibitor and statin. Given ACE inhibitor use we'll check BUN and creatinine. Her updated medication list for this problem includes:    Nitrostat 0.4 Mg Subl (Nitroglycerin) .Marland Kitchen... 1 tablet under tongue at onset of chest pain; you may repeat every 5 minutes for up to 3 doses.    Aspirin 81 Mg Tbec (Aspirin) .Marland Kitchen... Take one tablet by mouth daily    Carvedilol 6.25 Mg Tabs (Carvedilol) .Marland Kitchen... Take one tablet by mouth twice a day    Lisinopril 40 Mg Tabs (Lisinopril) .Marland Kitchen... Take one tablet by mouth daily  Problem # 5:   OTHER SPECIFIED ANEMIAS (ICD-285.8) Patient states she followed up with her primary care physician for this issue.  Problem # 6:  CHRONIC SYSTOLIC HEART FAILURE (ICD-428.22)  Her updated medication list for this problem includes:    Nitrostat 0.4 Mg Subl (Nitroglycerin) .Marland Kitchen... 1 tablet under tongue at onset of chest pain; you may repeat every 5 minutes for up to 3 doses.    Aspirin 81 Mg Tbec (Aspirin) .Marland Kitchen... Take one tablet by mouth daily    Carvedilol 6.25 Mg Tabs (Carvedilol) .Marland Kitchen... Take one tablet by mouth twice a day    Lisinopril 40 Mg Tabs (Lisinopril) .Marland Kitchen... Take one tablet by mouth daily  Problem # 7:  COPD (ICD-496)  Other Orders: T-Hepatic Function ([id number])  Patient Instructions: 1)  Your physician recommends that you schedule a follow-up appointment in: 6 MONTHS 2)  Your physician recommends that you return for a FASTING lipid profile: WHEN ABLE

## 2010-02-27 NOTE — Miscellaneous (Signed)
Summary: update meds  Clinical Lists Changes  Medications: Added new medication of FERROUS SULFATE 325 (65 FE) MG TABS (FERROUS SULFATE) Take 1 tablet by mouth once a day Added new medication of NITROSTAT 0.4 MG SUBL (NITROGLYCERIN) 1 tablet under tongue at onset of chest pain; you may repeat every 5 minutes for up to 3 doses. Added new medication of TYLENOL 325 MG TABS (ACETAMINOPHEN) as needed Added new medication of ASPIRIN 81 MG TBEC (ASPIRIN) Take one tablet by mouth daily Added new medication of CARVEDILOL 3.125 MG TABS (CARVEDILOL) Take one tablet by mouth twice a day Added new medication of RAMIPRIL 2.5 MG CAPS (RAMIPRIL) Take one capsule by mouth daily Added new medication of PRAVASTATIN SODIUM 20 MG TABS (PRAVASTATIN SODIUM) Take one tablet by mouth daily at bedtime

## 2010-02-27 NOTE — Letter (Signed)
Summary: Appointment - Missed  [facility]     [place], [place]    Phone:   Fax:      February 15, 2009 MRN: [id number]   [facility] [address] [place], [place]  [address]   Dear Ms. Lavey,  Our records indicate you missed your appointment on  02-15-2009 with  Dr.  Jens Som  It is very important that we reach you to reschedule this appointment. We look forward to participating in your health care needs. Please contact us at the number listed above at your earliest convenience to reschedule this appointment.     Sincerely,   Lorne Skeens  [facility] Scheduling Team

## 2010-02-27 NOTE — Assessment & Plan Note (Signed)
Summary: [facility]   Visit Type:  Post-hodpital  CC:  Some chest discomfort.  History of Present Illness: 65 year old female recently admitted to [facility] with chest pain. She ruled out for myocardial infarction with serial enzymes. A cardiac catheterization was performed on September 21 of 2010. This revealed left main with 50% eccentric distal stenosis, left anterior descending coronary artery 60% mid tubular lesion (does not appear to be flow limiting), circumflex occluded in its mid portion and distal circ appears to fill from septal collaterals, right coronary artery appeared to be a small and likely nondominant, subtotally occluded in the mid vessel with faint filling of the distal vessel.  Left ventricular ejection fraction in the 40% range.  Medical management recommended. An echocardiogram during that admission revealed an ejection fraction of 20-25%, mild aortic and mitral regurgitation and right ventricular enlargement. She also had a normocytic anemia. Since she was discharged she denies any dyspnea on exertion, orthopnea, PND, pedal edema, palpitations or syncope. She occasionally has a vague discomfort in her chest that does not radiate. It is not pleuritic or positional nor is it related to food. There's no associated symptoms. She has not had exertional chest pain.  Current Medications (verified): 1)  Ferrous Sulfate 325 (65 Fe) Mg Tabs (Ferrous Sulfate) .... Take 1 Tablet By Mouth Once A Day 2)  Nitrostat 0.4 Mg Subl (Nitroglycerin) .Marland Kitchen.. 1 Tablet Under Tongue At Onset of Chest Pain; You May Repeat Every 5 Minutes For Up To 3 Doses. 3)  Tylenol 325 Mg Tabs (Acetaminophen) .... As Needed 4)  Aspirin 81 Mg Tbec (Aspirin) .... Take One Tablet By Mouth Daily 5)  Carvedilol 3.125 Mg Tabs (Carvedilol) .... Take One Tablet By Mouth Twice A Day 6)  Ramipril 2.5 Mg Caps (Ramipril) .... Take One Capsule By Mouth Daily 7)  Pravastatin Sodium 20 Mg Tabs (Pravastatin Sodium) ....  Take One Tablet By Mouth Daily At Bedtime  Allergies (verified): 1)  ! * Asa High Doses  Past History:  Past Medical History: Current Problems:  CARDIOMYOPATHY (ICD-425.4) CAD (ICD-414.00)  Wide complex tachycardia likely nonsustained ventricular tachycardia.   Bronchitis, treated with IV azithromycin.  Anemia with H and H at a low of 9.6 and 29.   Past Surgical History: Reviewed history from 10/28/2008 and no changes required.  Hysterectomy,  benign tumor removed from her  back.      Social History: Reviewed history from 10/28/2008 and no changes required.  The patient lives with her son.  She has been smoking   for 35 years and currently smokes a half-pack per day.  She rarely   drinks alcohol.   Review of Systems       Chronic back pain but no fevers or chills, productive cough, hemoptysis, dysphasia, odynophagia, melena, hematochezia, dysuria, hematuria, rash, seizure activity, orthopnea, PND, pedal edema, claudication. Remaining systems are negative.   Vital Signs:  Patient profile:   65 year old female Height:      64 inches Weight:      80 pounds BMI:     13.78 Pulse rate:   60 / minute Pulse rhythm:   regular Resp:     18 per minute BP sitting:   148 / 82  (right arm) Cuff size:   regular  Vitals Entered By: Vikki Ports (November 02, 2008 10:49 AM)  Physical Exam  General:  well-developed well-nourished in no acute distress. Skin is warm and dry. Head:  HEENT is normal. Neck:  supple with no  bruits. No thyromegaly. Lungs:  clear to auscultation Heart:  regular rate and rhythm Abdomen:  soft and nontender. No masses palpated. Right groin with no hematoma and no bruit. Extremities:  no edema. Neurologic:  grossly intact.   EKG  Procedure date:  11/02/2008  Findings:      Normal sinus rhythm at a rate of 60. Axis normal. RV conduction delay. Left ventricular hypertrophy. Inferior T-wave inversion.  Impression & Recommendations:  Problem # 1:   CAD (ICD-414.00) Continue aspirin, beta blocker, ACE inhibitor and statin. Her updated medication list for this problem includes:    Nitrostat 0.4 Mg Subl (Nitroglycerin) .Marland Kitchen... 1 tablet under tongue at onset of chest pain; you may repeat every 5 minutes for up to 3 doses.    Aspirin 81 Mg Tbec (Aspirin) .Marland Kitchen... Take one tablet by mouth daily    Carvedilol 3.125 Mg Tabs (Carvedilol) .Marland Kitchen... Take one tablet by mouth twice a day    Lisinopril 20 Mg Tabs (Lisinopril) .Marland Kitchen... Take one tablet by mouth daily  Problem # 2:  CARDIOMYOPATHY (ICD-425.4) Will discontinue Altace and treat with lisinopril 20 mg p.o. daily. Check bmet in one week. We'll continue to titrate ACE inhibitor and beta blocker. Once fully titrated we'll plan to repeat echocardiogram to reassess LV function. Her updated medication list for this problem includes:    Nitrostat 0.4 Mg Subl (Nitroglycerin) .Marland Kitchen... 1 tablet under tongue at onset of chest pain; you may repeat every 5 minutes for up to 3 doses.    Aspirin 81 Mg Tbec (Aspirin) .Marland Kitchen... Take one tablet by mouth daily    Carvedilol 3.125 Mg Tabs (Carvedilol) .Marland Kitchen... Take one tablet by mouth twice a day    Lisinopril 20 Mg Tabs (Lisinopril) .Marland Kitchen... Take one tablet by mouth daily  Problem # 3:  PURE HYPERCHOLESTEROLEMIA (ICD-272.0) Continue statin. Check lipids and liver. Her updated medication list for this problem includes:    Pravastatin Sodium 20 Mg Tabs (Pravastatin sodium) .Marland Kitchen... Take one tablet by mouth daily at bedtime  Problem # 4:  TOBACCO ABUSE (ICD-305.1) Patient counseled on discontinuing for between 3-10 minutes.  Problem # 5:  OTHER SPECIFIED ANEMIAS (ICD-285.8) Patient needs to followup with her primary care for her her history of normocytic anemia.  Patient Instructions: 1)  Your physician recommends that you schedule a follow-up appointment in: 6 WEEKS-12/14/08 @ 10:30AM 2)  Your physician recommends that you return for lab work in:ONE WEEK 3)  Your physician has  recommended you make the following change in your medication: STOP RAMAPRIL 4)  START LISINOPRIL 20MG  ONE TABLET ONCE DAILY Prescriptions: LISINOPRIL 20 MG TABS (LISINOPRIL) Take one tablet by mouth daily  #90 x 4   Entered by:   Deliah Goody, RN   Authorized by:   Ferman Hamming, MD, [facility]   Signed by:   Deliah Goody, RN on 11/02/2008   Method used:   Electronically to        [facility] [phone]* (retail)       [address]       [place], [place]  [address]       Ph: [phone] or [phone]       Fax: [phone]   RxID:   [id number]

## 2010-02-27 NOTE — Letter (Signed)
Summary: [employer] Insurance Authorization  [employer] Authorization   Imported By: Roderic Ovens 12/08/2008 12:01:22  _____________________________________________________________________  External Attachment:    Type:   Image     Comment:   External Document

## 2010-03-14 ENCOUNTER — Encounter: Payer: Self-pay | Admitting: Cardiology

## 2010-03-14 ENCOUNTER — Ambulatory Visit (INDEPENDENT_AMBULATORY_CARE_PROVIDER_SITE_OTHER): Payer: PRIVATE HEALTH INSURANCE | Admitting: Cardiology

## 2010-03-14 DIAGNOSIS — I2589 Other forms of chronic ischemic heart disease: Secondary | ICD-10-CM

## 2010-03-14 DIAGNOSIS — J40 Bronchitis, not specified as acute or chronic: Secondary | ICD-10-CM | POA: Insufficient documentation

## 2010-03-21 NOTE — Assessment & Plan Note (Signed)
Summary: FOLLOW UP - 6 MONTHS   Visit Type:  6 months follow up Primary Provider:  Dr. Lawerance Bach  CC:  Heart flutter.  History of Present Illness: Pleasant female with coronary artery disease and cardiomyopathy.  A cardiac catheterization was performed on September 21 of 2010. This revealed left main with 50% eccentric distal stenosis, left anterior descending coronary artery 60% mid tubular lesion (does not appear to be flow limiting), circumflex occluded in its mid portion and distal circ appears to fill from septal collaterals, right coronary artery appeared to be a small and likely nondominant, subtotally occluded in the mid vessel with faint filling of the distal vessel.  Left ventricular ejection fraction in the 40% range.  Medical management recommended. An echocardiogram during that admission revealed an ejection fraction of 20-25%, mild aortic and mitral regurgitation and right ventricular enlargement. She also had a normocytic anemia. Repeat echocardiogram in March of 2011 following titration of medications revealed an ejection fraction of 20-25% and mild to moderate aortic insufficiency. She was seen by Dr. Ladona Ridgel and ICD recommended. I last saw her in August of 2011. Since then, she has some dyspnea on exertion relieved with rest. There is occasional orthopnea but no PND or pedal edema. She has not had syncope. She occasionally feels a brief flutter are 1-2 seconds but no sustained palpitations. She has a recent productive cough and some pain in her chest with cough only. She does not have exertional chest pain.   Current Medications (verified): 1)  Ferrous Sulfate 325 (65 Fe) Mg Tabs (Ferrous Sulfate) .... Take 1 Tablet By Mouth Once A Day 2)  Nitrostat 0.4 Mg Subl (Nitroglycerin) .Marland Kitchen.. 1 Tablet Under Tongue At Onset of Chest Pain; You May Repeat Every 5 Minutes For Up To 3 Doses. 3)  Tylenol 325 Mg Tabs (Acetaminophen) .... As Needed 4)  Aspirin 81 Mg Tbec (Aspirin) .... Take One Tablet By  Mouth Daily 5)  Carvedilol 6.25 Mg Tabs (Carvedilol) .... Take One Tablet By Mouth Twice A Day 6)  Lisinopril 40 Mg Tabs (Lisinopril) .... Take One Tablet By Mouth Daily 7)  Pravastatin Sodium 20 Mg Tabs (Pravastatin Sodium) .... Take One Tablet By Mouth Daily At Bedtime 8)  Trospium Chloride 20 Mg Tabs (Trospium Chloride) .... Take 1 Tablet By Mouth Two Times A Day  Allergies: 1)  ! * Asa High Doses  Past History:  Past Medical History: Reviewed history from 09/20/2009 and no changes required. Ischemic cardiomyopathy CAD (ICD-414.00) Hyperlipidemia Anemia with H and H at a low of 9.6 and 29.   Past Surgical History: Reviewed history from 10/28/2008 and no changes required.  Hysterectomy,  benign tumor removed from her  back.      Social History: Reviewed history from 10/28/2008 and no changes required.  The patient lives with her son.  She has been smoking   for 35 years and currently smokes a half-pack per day.  She rarely   drinks alcohol.   Review of Systems       no fevers or chills, productive cough, hemoptysis, dysphasia, odynophagia, melena, hematochezia, dysuria, hematuria, rash, seizure activity, orthopnea, PND, pedal edema, claudication. Remaining systems are negative.   Vital Signs:  Patient profile:   65 year old female Height:      64 inches Weight:      82 pounds BMI:     14.13 Pulse rate:   74 / minute Pulse rhythm:   regular Resp:     18 per minute BP sitting:  100 / 68  (left arm) Cuff size:   large  Vitals Entered By: Vikki Ports (March 14, 2010 9:55 AM)  Physical Exam  General:  Well-developed well-nourished in no acute distress.  Skin is warm and dry.  HEENT is normal.  Neck is supple. No thyromegaly.  Chest diminished breath sounds throughout..  Cardiovascular exam is regular rate and rhythm.  Abdominal exam nontender or distended. No masses palpated. Extremities show no edema. neuro grossly intact    EKG  Procedure date:   03/14/2010  Findings:      Sinus rhythm, left ventricular hypertrophy, inferior T-wave inversion, prolonged QT.  Impression & Recommendations:  Problem # 1:  BRONCHITIS (ICD-490) Patient provided with a Z-Pak today. Her updated medication list for this problem includes:    Zithromax Tri-pak 500 Mg Tabs (Azithromycin) .Marland Kitchen... Take as directed  Problem # 2:  COPD (ICD-496) Management per primary care.  Problem # 3:  TOBACCO ABUSE (ICD-305.1) Patient counseled on discontinuing.  Problem # 4:  PURE HYPERCHOLESTEROLEMIA (ICD-272.0) Continue statin. Check lipids and liver. Her updated medication list for this problem includes:    Pravastatin Sodium 20 Mg Tabs (Pravastatin sodium) .Marland Kitchen... Take one tablet by mouth daily at bedtime  Problem # 5:  CAD (ICD-414.00) Continue aspirin, ACE inhibitor, beta blocker and statin. Her updated medication list for this problem includes:    Nitrostat 0.4 Mg Subl (Nitroglycerin) .Marland Kitchen... 1 tablet under tongue at onset of chest pain; you may repeat every 5 minutes for up to 3 doses.    Aspirin 81 Mg Tbec (Aspirin) .Marland Kitchen... Take one tablet by mouth daily    Carvedilol 6.25 Mg Tabs (Carvedilol) .Marland Kitchen... Take one tablet by mouth twice a day    Lisinopril 40 Mg Tabs (Lisinopril) .Marland Kitchen... Take one tablet by mouth daily  Problem # 6:  CARDIOMYOPATHY (ICD-425.4) Patient with ischemic cardiomyopathy. I have recommended an ICD previously but she has been hesitant. Seen by Dr. Ladona Ridgel in the past. She is agreeable to a repeat echocardiogram. If her ejection fraction is less than or equal to 35% she states she would be agreeable to ICD. Continue present medications. Check potassium and renal function. Check BNP. Her updated medication list for this problem includes:    Nitrostat 0.4 Mg Subl (Nitroglycerin) .Marland Kitchen... 1 tablet under tongue at onset of chest pain; you may repeat every 5 minutes for up to 3 doses.    Aspirin 81 Mg Tbec (Aspirin) .Marland Kitchen... Take one tablet by mouth daily     Carvedilol 6.25 Mg Tabs (Carvedilol) .Marland Kitchen... Take one tablet by mouth twice a day    Lisinopril 40 Mg Tabs (Lisinopril) .Marland Kitchen... Take one tablet by mouth daily  Orders: Echocardiogram (Echo)  Patient Instructions: 1)  Your physician has recommended you make the following change in your medication: TAKE ZITHROMAX AS DIRECTED 2)  Your physician wants you to follow-up in:6 MONTHS   You will receive a reminder letter in the mail two months in advance. If you don't receive a letter, please call our office to schedule the follow-up appointment. 3)  Your physician has requested that you have an echocardiogram.  Echocardiography is a painless test that uses sound waves to create images of your heart. It provides your doctor with information about the size and shape of your heart and how well your heart's chambers and valves are working.  This procedure takes approximately one hour. There are no restrictions for this procedure. Prescriptions: ZITHROMAX TRI-PAK 500 MG TABS (AZITHROMYCIN) take as directed  #1  x 0   Entered by:   Deliah Goody, RN   Authorized by:   Ferman Hamming, MD, [facility]   Signed by:   Deliah Goody, RN on 03/14/2010   Method used:   Electronically to        [facility] [phone][facility] (retail)       [address]       [place], [place]  [address]       Ph: [phone] or [phone]       Fax: [phone]   RxID:   [id number]

## 2010-03-28 ENCOUNTER — Other Ambulatory Visit: Payer: Self-pay | Admitting: Cardiology

## 2010-03-28 ENCOUNTER — Encounter: Payer: Self-pay | Admitting: Cardiology

## 2010-03-28 ENCOUNTER — Ambulatory Visit (HOSPITAL_COMMUNITY): Payer: Commercial Indemnity | Attending: Cardiology

## 2010-03-28 ENCOUNTER — Other Ambulatory Visit (INDEPENDENT_AMBULATORY_CARE_PROVIDER_SITE_OTHER): Payer: PRIVATE HEALTH INSURANCE

## 2010-03-28 DIAGNOSIS — I2589 Other forms of chronic ischemic heart disease: Secondary | ICD-10-CM

## 2010-03-28 DIAGNOSIS — R5383 Other fatigue: Secondary | ICD-10-CM

## 2010-03-28 DIAGNOSIS — Z79899 Other long term (current) drug therapy: Secondary | ICD-10-CM

## 2010-03-28 DIAGNOSIS — R5381 Other malaise: Secondary | ICD-10-CM

## 2010-03-28 DIAGNOSIS — E785 Hyperlipidemia, unspecified: Secondary | ICD-10-CM | POA: Insufficient documentation

## 2010-03-28 DIAGNOSIS — I2510 Atherosclerotic heart disease of native coronary artery without angina pectoris: Secondary | ICD-10-CM

## 2010-03-28 DIAGNOSIS — I259 Chronic ischemic heart disease, unspecified: Secondary | ICD-10-CM

## 2010-03-28 DIAGNOSIS — J4489 Other specified chronic obstructive pulmonary disease: Secondary | ICD-10-CM | POA: Insufficient documentation

## 2010-03-28 DIAGNOSIS — E7800 Pure hypercholesterolemia, unspecified: Secondary | ICD-10-CM

## 2010-03-28 DIAGNOSIS — J449 Chronic obstructive pulmonary disease, unspecified: Secondary | ICD-10-CM | POA: Insufficient documentation

## 2010-03-28 DIAGNOSIS — Z87891 Personal history of nicotine dependence: Secondary | ICD-10-CM | POA: Insufficient documentation

## 2010-03-28 DIAGNOSIS — I200 Unstable angina: Secondary | ICD-10-CM | POA: Insufficient documentation

## 2010-03-28 LAB — LIPID PANEL
Cholesterol: 130 mg/dL (ref 0–200)
HDL: 39.1 mg/dL (ref >39.00)
LDL Cholesterol: 75 mg/dL (ref 0–99)
Total CHOL/HDL Ratio: 3
Triglycerides: 82 mg/dL (ref 0.0–149.0)
VLDL: 16.4 mg/dL (ref 0.0–40.0)

## 2010-03-28 LAB — BASIC METABOLIC PANEL
BUN: 8 mg/dL (ref 6–23)
CO2: 31 mEq/L (ref 19–32)
Calcium: 9.3 mg/dL (ref 8.4–10.5)
Chloride: 104 mEq/L (ref 96–112)
Creatinine, Ser: 0.8 mg/dL (ref 0.4–1.2)
GFR: 98.8 mL/min (ref >60.00)
Glucose, Bld: 73 mg/dL (ref 70–99)
Potassium: 4.5 mEq/L (ref 3.5–5.1)
Sodium: 141 mEq/L (ref 135–145)

## 2010-03-28 LAB — HEPATIC FUNCTION PANEL
ALT: 8 U/L (ref 0–35)
AST: 14 U/L (ref 0–37)
Albumin: 3.8 g/dL (ref 3.5–5.2)
Alkaline Phosphatase: 36 U/L — ABNORMAL LOW (ref 39–117)
Bilirubin, Direct: 0.1 mg/dL (ref 0.0–0.3)
Total Bilirubin: 0.8 mg/dL (ref 0.3–1.2)
Total Protein: 7.7 g/dL (ref 6.0–8.3)

## 2010-03-28 LAB — BRAIN NATRIURETIC PEPTIDE: Pro B Natriuretic peptide (BNP): 115.1 pg/mL — ABNORMAL HIGH (ref 0.0–100.0)

## 2010-05-04 LAB — POCT I-STAT 3, VENOUS BLOOD GAS (G3P V)
Acid-base deficit: 1 mmol/L (ref 0.0–2.0)
Bicarbonate: 24.6 mEq/L — ABNORMAL HIGH (ref 20.0–24.0)
O2 Saturation: 68 %
TCO2: 26 mmol/L (ref 0–100)
pCO2, Ven: 44.5 mmHg — ABNORMAL LOW (ref 45.0–50.0)
pH, Ven: 7.351 — ABNORMAL HIGH (ref 7.250–7.300)
pO2, Ven: 37 mmHg (ref 30.0–45.0)

## 2010-05-04 LAB — CBC
HCT: 29.3 % — ABNORMAL LOW (ref 36.0–46.0)
HCT: 31 % — ABNORMAL LOW (ref 36.0–46.0)
HCT: 31.2 % — ABNORMAL LOW (ref 36.0–46.0)
HCT: 32.9 % — ABNORMAL LOW (ref 36.0–46.0)
HCT: 36.7 % (ref 36.0–46.0)
Hemoglobin: 10.2 g/dL — ABNORMAL LOW (ref 12.0–15.0)
Hemoglobin: 10.3 g/dL — ABNORMAL LOW (ref 12.0–15.0)
Hemoglobin: 10.9 g/dL — ABNORMAL LOW (ref 12.0–15.0)
Hemoglobin: 12.1 g/dL (ref 12.0–15.0)
Hemoglobin: 9.6 g/dL — ABNORMAL LOW (ref 12.0–15.0)
MCHC: 32.7 g/dL (ref 30.0–36.0)
MCHC: 32.8 g/dL (ref 30.0–36.0)
MCHC: 33 g/dL (ref 30.0–36.0)
MCHC: 33 g/dL (ref 30.0–36.0)
MCHC: 33 g/dL (ref 30.0–36.0)
MCV: 90.4 fL (ref 78.0–100.0)
MCV: 90.5 fL (ref 78.0–100.0)
MCV: 90.7 fL (ref 78.0–100.0)
MCV: 90.7 fL (ref 78.0–100.0)
MCV: 91.1 fL (ref 78.0–100.0)
Platelets: 198 10*3/uL (ref 150–400)
Platelets: 222 10*3/uL (ref 150–400)
Platelets: 233 10*3/uL (ref 150–400)
Platelets: 235 10*3/uL (ref 150–400)
Platelets: 273 10*3/uL (ref 150–400)
RBC: 3.23 MIL/uL — ABNORMAL LOW (ref 3.87–5.11)
RBC: 3.43 MIL/uL — ABNORMAL LOW (ref 3.87–5.11)
RBC: 3.45 MIL/uL — ABNORMAL LOW (ref 3.87–5.11)
RBC: 3.63 MIL/uL — ABNORMAL LOW (ref 3.87–5.11)
RBC: 4.02 MIL/uL (ref 3.87–5.11)
RDW: 13.6 % (ref 11.5–15.5)
RDW: 13.9 % (ref 11.5–15.5)
RDW: 14 % (ref 11.5–15.5)
RDW: 14.1 % (ref 11.5–15.5)
RDW: 14.4 % (ref 11.5–15.5)
WBC: 11 10*3/uL — ABNORMAL HIGH (ref 4.0–10.5)
WBC: 12.6 10*3/uL — ABNORMAL HIGH (ref 4.0–10.5)
WBC: 13.5 10*3/uL — ABNORMAL HIGH (ref 4.0–10.5)
WBC: 14.9 10*3/uL — ABNORMAL HIGH (ref 4.0–10.5)
WBC: 8.8 10*3/uL (ref 4.0–10.5)

## 2010-05-04 LAB — HEPARIN LEVEL (UNFRACTIONATED)
Heparin Unfractionated: 0.1 IU/mL — ABNORMAL LOW (ref 0.30–0.70)
Heparin Unfractionated: 0.24 IU/mL — ABNORMAL LOW (ref 0.30–0.70)
Heparin Unfractionated: <0.1 IU/mL — ABNORMAL LOW (ref 0.30–0.70)

## 2010-05-04 LAB — BASIC METABOLIC PANEL
BUN: 3 mg/dL — ABNORMAL LOW (ref 6–23)
BUN: 4 mg/dL — ABNORMAL LOW (ref 6–23)
BUN: 5 mg/dL — ABNORMAL LOW (ref 6–23)
BUN: 5 mg/dL — ABNORMAL LOW (ref 6–23)
BUN: 6 mg/dL (ref 6–23)
BUN: 7 mg/dL (ref 6–23)
BUN: 7 mg/dL (ref 6–23)
CO2: 28 mEq/L (ref 19–32)
CO2: 28 mEq/L (ref 19–32)
CO2: 28 mEq/L (ref 19–32)
CO2: 28 mEq/L (ref 19–32)
CO2: 31 mEq/L (ref 19–32)
CO2: 32 mEq/L (ref 19–32)
CO2: 34 mEq/L — ABNORMAL HIGH (ref 19–32)
Calcium: 8.2 mg/dL — ABNORMAL LOW (ref 8.4–10.5)
Calcium: 8.3 mg/dL — ABNORMAL LOW (ref 8.4–10.5)
Calcium: 8.4 mg/dL (ref 8.4–10.5)
Calcium: 8.7 mg/dL (ref 8.4–10.5)
Calcium: 8.8 mg/dL (ref 8.4–10.5)
Calcium: 9.1 mg/dL (ref 8.4–10.5)
Calcium: 9.1 mg/dL (ref 8.4–10.5)
Chloride: 102 mEq/L (ref 96–112)
Chloride: 103 mEq/L (ref 96–112)
Chloride: 104 mEq/L (ref 96–112)
Chloride: 105 mEq/L (ref 96–112)
Chloride: 106 mEq/L (ref 96–112)
Chloride: 95 mEq/L — ABNORMAL LOW (ref 96–112)
Chloride: 97 mEq/L (ref 96–112)
Creatinine, Ser: 0.67 mg/dL (ref 0.4–1.2)
Creatinine, Ser: 0.69 mg/dL (ref 0.4–1.2)
Creatinine, Ser: 0.69 mg/dL (ref 0.4–1.2)
Creatinine, Ser: 0.75 mg/dL (ref 0.4–1.2)
Creatinine, Ser: 0.76 mg/dL (ref 0.4–1.2)
Creatinine, Ser: 0.77 mg/dL (ref 0.4–1.2)
Creatinine, Ser: 0.8 mg/dL (ref 0.4–1.2)
GFR calc Af Amer: >60 mL/min (ref >60)
GFR calc Af Amer: >60 mL/min (ref >60)
GFR calc Af Amer: >60 mL/min (ref >60)
GFR calc Af Amer: >60 mL/min (ref >60)
GFR calc Af Amer: >60 mL/min (ref >60)
GFR calc Af Amer: >60 mL/min (ref >60)
GFR calc Af Amer: >60 mL/min (ref >60)
GFR calc non Af Amer: >60 mL/min (ref >60)
GFR calc non Af Amer: >60 mL/min (ref >60)
GFR calc non Af Amer: >60 mL/min (ref >60)
GFR calc non Af Amer: >60 mL/min (ref >60)
GFR calc non Af Amer: >60 mL/min (ref >60)
GFR calc non Af Amer: >60 mL/min (ref >60)
GFR calc non Af Amer: >60 mL/min (ref >60)
Glucose, Bld: 100 mg/dL — ABNORMAL HIGH (ref 70–99)
Glucose, Bld: 103 mg/dL — ABNORMAL HIGH (ref 70–99)
Glucose, Bld: 106 mg/dL — ABNORMAL HIGH (ref 70–99)
Glucose, Bld: 112 mg/dL — ABNORMAL HIGH (ref 70–99)
Glucose, Bld: 79 mg/dL (ref 70–99)
Glucose, Bld: 88 mg/dL (ref 70–99)
Glucose, Bld: 95 mg/dL (ref 70–99)
Potassium: 2.5 mEq/L — CL (ref 3.5–5.1)
Potassium: 2.9 mEq/L — ABNORMAL LOW (ref 3.5–5.1)
Potassium: 4 mEq/L (ref 3.5–5.1)
Potassium: 4.3 mEq/L (ref 3.5–5.1)
Potassium: 4.9 mEq/L (ref 3.5–5.1)
Potassium: 5.2 mEq/L — ABNORMAL HIGH (ref 3.5–5.1)
Potassium: 6 mEq/L — ABNORMAL HIGH (ref 3.5–5.1)
Sodium: 137 mEq/L (ref 135–145)
Sodium: 137 mEq/L (ref 135–145)
Sodium: 137 mEq/L (ref 135–145)
Sodium: 138 mEq/L (ref 135–145)
Sodium: 139 mEq/L (ref 135–145)
Sodium: 140 mEq/L (ref 135–145)
Sodium: 140 mEq/L (ref 135–145)

## 2010-05-04 LAB — DIFFERENTIAL
Basophils Absolute: 0 10*3/uL (ref 0.0–0.1)
Basophils Absolute: 0 10*3/uL (ref 0.0–0.1)
Basophils Relative: 0 % (ref 0–1)
Basophils Relative: 0 % (ref 0–1)
Eosinophils Absolute: 0.1 10*3/uL (ref 0.0–0.7)
Eosinophils Absolute: 0.3 10*3/uL (ref 0.0–0.7)
Eosinophils Relative: 1 % (ref 0–5)
Eosinophils Relative: 2 % (ref 0–5)
Lymphocytes Relative: 27 % (ref 12–46)
Lymphocytes Relative: 32 % (ref 12–46)
Lymphs Abs: 3.9 10*3/uL (ref 0.7–4.0)
Lymphs Abs: 4.1 10*3/uL — ABNORMAL HIGH (ref 0.7–4.0)
Monocytes Absolute: 0.6 10*3/uL (ref 0.1–1.0)
Monocytes Absolute: 0.7 10*3/uL (ref 0.1–1.0)
Monocytes Relative: 5 % (ref 3–12)
Monocytes Relative: 5 % (ref 3–12)
Neutro Abs: 10 10*3/uL — ABNORMAL HIGH (ref 1.7–7.7)
Neutro Abs: 7.8 10*3/uL — ABNORMAL HIGH (ref 1.7–7.7)
Neutrophils Relative %: 62 % (ref 43–77)
Neutrophils Relative %: 67 % (ref 43–77)

## 2010-05-04 LAB — T4, FREE: Free T4: 0.87 ng/dL (ref 0.80–1.80)

## 2010-05-04 LAB — POCT I-STAT 3, ART BLOOD GAS (G3+)
Acid-Base Excess: 1 mmol/L (ref 0.0–2.0)
Bicarbonate: 26.8 mEq/L — ABNORMAL HIGH (ref 20.0–24.0)
O2 Saturation: 95 %
TCO2: 28 mmol/L (ref 0–100)
pCO2 arterial: 45.9 mmHg — ABNORMAL HIGH (ref 35.0–45.0)
pH, Arterial: 7.373 (ref 7.350–7.400)
pO2, Arterial: 80 mmHg (ref 80.0–100.0)

## 2010-05-04 LAB — CK TOTAL AND CKMB (NOT AT ARMC)
CK, MB: 0.5 ng/mL (ref 0.3–4.0)
CK, MB: 0.8 ng/mL (ref 0.3–4.0)
CK, MB: 1 ng/mL (ref 0.3–4.0)
Relative Index: INVALID (ref 0.0–2.5)
Relative Index: INVALID (ref 0.0–2.5)
Relative Index: INVALID (ref 0.0–2.5)
Total CK: 48 U/L (ref 7–177)
Total CK: 54 U/L (ref 7–177)
Total CK: 60 U/L (ref 7–177)

## 2010-05-04 LAB — HEPATIC FUNCTION PANEL
ALT: 12 U/L (ref 0–35)
AST: 17 U/L (ref 0–37)
Albumin: 2.7 g/dL — ABNORMAL LOW (ref 3.5–5.2)
Alkaline Phosphatase: 41 U/L (ref 39–117)
Bilirubin, Direct: 0.2 mg/dL (ref 0.0–0.3)
Indirect Bilirubin: 1.3 mg/dL — ABNORMAL HIGH (ref 0.3–0.9)
Total Bilirubin: 1.5 mg/dL — ABNORMAL HIGH (ref 0.3–1.2)
Total Protein: 6.3 g/dL (ref 6.0–8.3)

## 2010-05-04 LAB — VITAMIN B12: Vitamin B-12: 271 pg/mL (ref 211–911)

## 2010-05-04 LAB — POCT CARDIAC MARKERS
CKMB, poc: 1.2 ng/mL (ref 1.0–8.0)
Myoglobin, poc: 59.1 ng/mL (ref 12–200)
Troponin i, poc: <0.05 ng/mL (ref 0.00–0.09)

## 2010-05-04 LAB — POTASSIUM: Potassium: 3.1 mEq/L — ABNORMAL LOW (ref 3.5–5.1)

## 2010-05-04 LAB — LIPID PANEL
Cholesterol: 113 mg/dL (ref 0–200)
HDL: 40 mg/dL (ref >39)
LDL Cholesterol: 62 mg/dL (ref 0–99)
Total CHOL/HDL Ratio: 2.8 RATIO
Triglycerides: 53 mg/dL (ref <150)
VLDL: 11 mg/dL (ref 0–40)

## 2010-05-04 LAB — TROPONIN I
Troponin I: 0.03 ng/mL (ref 0.00–0.06)
Troponin I: 0.03 ng/mL (ref 0.00–0.06)
Troponin I: 0.03 ng/mL (ref 0.00–0.06)

## 2010-05-04 LAB — TSH: TSH: 0.694 u[IU]/mL (ref 0.350–4.500)

## 2010-05-04 LAB — FERRITIN: Ferritin: 170 ng/mL (ref 10–291)

## 2010-05-04 LAB — IRON AND TIBC
Iron: 47 ug/dL (ref 42–135)
Saturation Ratios: 19 % — ABNORMAL LOW (ref 20–55)
TIBC: 246 ug/dL — ABNORMAL LOW (ref 250–470)
UIBC: 199 ug/dL

## 2010-05-04 LAB — PROTIME-INR
INR: 1.1 (ref 0.00–1.49)
Prothrombin Time: 14.1 seconds (ref 11.6–15.2)

## 2010-05-04 LAB — HEMOCCULT GUIAC POC 1CARD (OFFICE): Fecal Occult Bld: NEGATIVE

## 2010-05-04 LAB — GLUCOSE, CAPILLARY: Glucose-Capillary: 86 mg/dL (ref 70–99)

## 2010-05-10 ENCOUNTER — Ambulatory Visit: Payer: PRIVATE HEALTH INSURANCE | Admitting: Internal Medicine

## 2010-05-22 ENCOUNTER — Encounter: Payer: Self-pay | Admitting: Internal Medicine

## 2010-05-23 ENCOUNTER — Encounter: Payer: Self-pay | Admitting: Internal Medicine

## 2010-05-23 ENCOUNTER — Ambulatory Visit (INDEPENDENT_AMBULATORY_CARE_PROVIDER_SITE_OTHER): Payer: PRIVATE HEALTH INSURANCE | Admitting: Internal Medicine

## 2010-05-23 DIAGNOSIS — I5022 Chronic systolic (congestive) heart failure: Secondary | ICD-10-CM

## 2010-05-23 DIAGNOSIS — I2510 Atherosclerotic heart disease of native coronary artery without angina pectoris: Secondary | ICD-10-CM

## 2010-05-23 NOTE — Assessment & Plan Note (Signed)
She denies anginal symptoms. She will continue her current medications. 

## 2010-05-23 NOTE — Assessment & Plan Note (Signed)
The patient has class II congestive heart failure. She is on maximal medical therapy. I recommended proceeding with prophylactic ICD insertion. She is considering her options and will call us if she wishes to proceed. At the present time she is not sure that she wants to do this. I discussed the risk, goals, benefits, and expectations of the procedure. She will continue her current medications and maintain a low-sodium diet.

## 2010-05-23 NOTE — Progress Notes (Signed)
HPI   Tonya Harper returns today for followup. I seen her about a year ago. At that time she had been referred for consideration for prophylactic ICD insertion. She refused. Over the last year she has had chronic systolic heart failure class II. She does admit to occasional syncopal episodes where she'll get dizzy. She has not had palpitations. She denies peripheral edema. Allergies  Allergen Reactions  . Aspirin     nausea and dizziness     Current Outpatient Prescriptions  Medication Sig Dispense Refill  . acetaminophen (TYLENOL) 325 MG tablet Take 650 mg by mouth every 6 (six) hours as needed.        . carvedilol (COREG) 6.25 MG tablet Take 6.25 mg by mouth 2 (two) times daily with a meal.        . ferrous sulfate 325 (65 FE) MG EC tablet Take 325 mg by mouth daily.        Marland Kitchen lisinopril (PRINIVIL,ZESTRIL) 40 MG tablet Take 40 mg by mouth daily.        . nitroGLYCERIN (NITROSTAT) 0.4 MG SL tablet Place 0.4 mg under the tongue every 5 (five) minutes as needed.        . pravastatin (PRAVACHOL) 20 MG tablet Take 20 mg by mouth daily.        Marland Kitchen DISCONTD: aspirin 81 MG tablet Take 81 mg by mouth daily.        Marland Kitchen DISCONTD: azithromycin (ZITHROMAX) 500 MG tablet Take 500 mg by mouth as directed.        Marland Kitchen DISCONTD: trospium (SANCTURA) 20 MG tablet Take 20 mg by mouth 2 (two) times daily.           Past Medical History  Diagnosis Date  . Pure hypercholesterolemia   . HYPOKALEMIA   . TOBACCO ABUSE   . CAD   . Chronic systolic heart failure   . COPD   . BRONCHITIS   . OTHER SPEC FORMS CHRONIC ISCHEMIC HEART DISEASE     ROS:   All systems reviewed and negative except as noted in the HPI.   No past surgical history on file.   Family History  Problem Relation Age of Onset  . Heart attack Father   . Coronary artery disease Brother      History   Social History  . Marital Status: Single    Spouse Name: N/A    Number of Children: N/A  . Years of Education: N/A   Occupational  History  . Not on file.   Social History Main Topics  . Smoking status: Never Smoker   . Smokeless tobacco: Not on file  . Alcohol Use: Not on file  . Drug Use: Not on file  . Sexually Active: Not on file   Other Topics Concern  . Not on file   Social History Narrative  . No narrative on file     BP 104/58  Pulse 74  Resp 12  Ht 5\' 4"  (1.626 m)  Wt 82 lb (37.195 kg)  BMI 14.08 kg/m2  Physical Exam:  Thin, well appearing NAD HEENT: Unremarkable Neck:  No JVD, no thyromegally Lymphatics:  No adenopathy Back:  No CVA tenderness Lungs:  Clear. HEART:  Regular rate rhythm, no murmurs, no rubs, no clicks Abd:  Flat, positive bowel sounds, no organomegally, no rebound, no guarding Ext:  2 plus pulses, no edema, no cyanosis, no clubbing Skin:  No rashes no nodules Neuro:  CN II through XII intact, motor grossly intact  Assess/Plan:

## 2010-05-23 NOTE — Patient Instructions (Signed)
Your physician has recommended that you have a defibrillator inserted. An implantable cardioverter defibrillator (ICD) is a small device that is placed in your chest or, in rare cases, your abdomen. This device uses electrical pulses or shocks to help control life-threatening, irregular heartbeats that could lead the heart to suddenly stop beating (sudden cardiac arrest). Leads are attached to the ICD that goes into your heart. This is done in the hospital and usually requires an overnight stay. Please see the instruction sheet given to you today for more information.  Patient not sure she wants to pursue ICD at present will call back

## 2010-06-08 ENCOUNTER — Other Ambulatory Visit: Payer: Self-pay | Admitting: Gastroenterology

## 2010-06-11 ENCOUNTER — Telehealth: Payer: Self-pay | Admitting: Cardiology

## 2010-06-11 MED ORDER — CARVEDILOL 6.25 MG PO TABS: 6.2500 mg | ORAL_TABLET | Freq: Two times a day (BID) | ORAL | Status: DC

## 2010-06-11 NOTE — Telephone Encounter (Signed)
Pt needs carvedilol called into [facility] [phone].

## 2010-07-12 ENCOUNTER — Other Ambulatory Visit: Payer: Self-pay | Admitting: *Deleted

## 2010-07-12 MED ORDER — CARVEDILOL 6.25 MG PO TABS: 6.2500 mg | ORAL_TABLET | Freq: Two times a day (BID) | ORAL | Status: DC

## 2010-11-09 ENCOUNTER — Emergency Department (HOSPITAL_BASED_OUTPATIENT_CLINIC_OR_DEPARTMENT_OTHER)
Admission: EM | Admit: 2010-11-09 | Discharge: 2010-11-09 | Disposition: A | Discharge status: Home / Self Care | Payer: PRIVATE HEALTH INSURANCE | Attending: Emergency Medicine | Admitting: Emergency Medicine

## 2010-11-09 ENCOUNTER — Telehealth: Payer: Self-pay | Admitting: Cardiology

## 2010-11-09 ENCOUNTER — Emergency Department (INDEPENDENT_AMBULATORY_CARE_PROVIDER_SITE_OTHER): Payer: PRIVATE HEALTH INSURANCE

## 2010-11-09 DIAGNOSIS — R0602 Shortness of breath: Secondary | ICD-10-CM | POA: Insufficient documentation

## 2010-11-09 DIAGNOSIS — E7800 Pure hypercholesterolemia, unspecified: Secondary | ICD-10-CM | POA: Insufficient documentation

## 2010-11-09 DIAGNOSIS — F17200 Nicotine dependence, unspecified, uncomplicated: Secondary | ICD-10-CM | POA: Insufficient documentation

## 2010-11-09 DIAGNOSIS — J40 Bronchitis, not specified as acute or chronic: Secondary | ICD-10-CM

## 2010-11-09 DIAGNOSIS — I2510 Atherosclerotic heart disease of native coronary artery without angina pectoris: Secondary | ICD-10-CM | POA: Insufficient documentation

## 2010-11-09 DIAGNOSIS — J449 Chronic obstructive pulmonary disease, unspecified: Secondary | ICD-10-CM

## 2010-11-09 MED ORDER — ALBUTEROL SULFATE HFA 108 (90 BASE) MCG/ACT IN AERS: 1.0000 | INHALATION_SPRAY | Freq: Four times a day (QID) | RESPIRATORY_TRACT | Status: DC | PRN

## 2010-11-09 MED ORDER — AZITHROMYCIN 250 MG PO TABS: 250.0000 mg | ORAL_TABLET | Freq: Every day | ORAL | Status: AC

## 2010-11-09 NOTE — Telephone Encounter (Signed)
Spoke with pt who is asking if Dr. Jens Som could call her in a prescription for a Z-pak. Pt reports she feels like she has bronchitis.  Pt has no primary care MD so I instructed her to go to [facility] to be evaluated.  She is also due for follow up appt with Dr. Jens Som. Schedulers will contact her to make this appt.

## 2010-11-09 NOTE — ED Notes (Signed)
No distress noted in Pt. °

## 2010-11-09 NOTE — ED Notes (Signed)
Pt walked to room in NAD.  Initial SPO2 was 89-90%.  While sitting SPO2 increased to 97%.

## 2010-11-09 NOTE — Telephone Encounter (Signed)
Pt returning your call

## 2010-11-09 NOTE — ED Provider Notes (Signed)
History     CSN: [id number] Arrival date & time: 11/09/2010  1:57 PM  Chief Complaint  Patient presents with  . Shortness of Breath    (Consider location/radiation/quality/duration/timing/severity/associated sxs/prior treatment) Patient is a 65 y.o. female presenting with cough. The history is provided by the patient.  Cough This is a new problem. The current episode started more than 1 week ago. The problem occurs constantly. The problem has been gradually worsening. The cough is non-productive. There has been no fever. Associated symptoms include shortness of breath. She has tried nothing for the symptoms. The treatment provided no relief. She is a smoker. Her past medical history is significant for bronchitis. Her past medical history does not include pneumonia.  Pt complains of a cough and congestion.  Pt reports she is a smoker.  Pt denies any history of lung problems.  Past Medical History  Diagnosis Date  . Pure hypercholesterolemia   . HYPOKALEMIA   . TOBACCO ABUSE   . CAD   . Chronic systolic heart failure   . COPD   . BRONCHITIS   . OTHER SPEC FORMS CHRONIC ISCHEMIC HEART DISEASE     No past surgical history on file.  Family History  Problem Relation Age of Onset  . Heart attack Father   . Coronary artery disease Brother     History  Substance Use Topics  . Smoking status: Never Smoker   . Smokeless tobacco: Not on file  . Alcohol Use: Not on file    OB History    Grav Para Term Preterm Abortions TAB SAB Ect Mult Living                  Review of Systems  Respiratory: Positive for cough and shortness of breath.   All other systems reviewed and are negative.    Allergies  Aspirin  Home Medications   Current Outpatient Rx  Name Route Sig Dispense Refill  . ACETAMINOPHEN 325 MG PO TABS Oral Take 650 mg by mouth every 6 (six) hours as needed.      Marland Kitchen CARVEDILOL 6.25 MG PO TABS Oral Take 1 tablet (6.25 mg total) by mouth 2 (two) times daily with a  meal. 180 tablet 2  . FERROUS SULFATE 325 (65 FE) MG PO TBEC Oral Take 325 mg by mouth daily.      Marland Kitchen LISINOPRIL 40 MG PO TABS Oral Take 40 mg by mouth daily.      Marland Kitchen NITROGLYCERIN 0.4 MG SL SUBL Sublingual Place 0.4 mg under the tongue every 5 (five) minutes as needed.      Marland Kitchen PRAVASTATIN SODIUM 20 MG PO TABS Oral Take 20 mg by mouth daily.        BP 157/73  Pulse 68  Temp(Src) 98.1 F (36.7 C) (Oral)  Resp 18  SpO2 93%  Physical Exam  Constitutional: She is oriented to person, place, and time. She appears well-developed and well-nourished.  HENT:  Head: Normocephalic and atraumatic.  Right Ear: External ear normal.  Left Ear: External ear normal.  Mouth/Throat: Oropharynx is clear and moist.  Eyes: Conjunctivae and EOM are normal. Pupils are equal, round, and reactive to light.  Neck: Normal range of motion. Neck supple.  Cardiovascular: Normal rate.   Pulmonary/Chest: Effort normal.  Abdominal: Soft.  Musculoskeletal: Normal range of motion.  Neurological: She is alert and oriented to person, place, and time. She has normal reflexes.  Skin: Skin is warm.  Psychiatric: She has a normal mood and affect.  ED Course  Procedures (including critical care time)  Labs Reviewed - No data to display No results found.   No diagnosis found.    MDM  albutero inhaler and zithromax.  Pt advised to see her Md for recheck        Langston Masker, [place] 11/10/10 1610

## 2010-11-09 NOTE — ED Notes (Signed)
Pt. Reports she has had a cough with no production.  Pt. Reports when she tries to take a deep inspiration she has a tightness in her lungs.

## 2010-11-11 NOTE — ED Provider Notes (Signed)
Medical screening examination/treatment/procedure(s) were performed by non-physician practitioner and as supervising physician I was immediately available for consultation/collaboration.  Raeford Razor, MD 11/11/10 [phone]

## 2010-11-14 ENCOUNTER — Ambulatory Visit: Payer: PRIVATE HEALTH INSURANCE | Admitting: Cardiology

## 2010-11-27 ENCOUNTER — Encounter: Payer: Self-pay | Admitting: *Deleted

## 2010-11-28 ENCOUNTER — Ambulatory Visit (INDEPENDENT_AMBULATORY_CARE_PROVIDER_SITE_OTHER): Payer: PRIVATE HEALTH INSURANCE | Admitting: Cardiology

## 2010-11-28 ENCOUNTER — Encounter: Payer: Self-pay | Admitting: Cardiology

## 2010-11-28 DIAGNOSIS — R0989 Other specified symptoms and signs involving the circulatory and respiratory systems: Secondary | ICD-10-CM

## 2010-11-28 DIAGNOSIS — I6529 Occlusion and stenosis of unspecified carotid artery: Secondary | ICD-10-CM | POA: Insufficient documentation

## 2010-11-28 DIAGNOSIS — E7800 Pure hypercholesterolemia, unspecified: Secondary | ICD-10-CM

## 2010-11-28 DIAGNOSIS — F17200 Nicotine dependence, unspecified, uncomplicated: Secondary | ICD-10-CM

## 2010-11-28 DIAGNOSIS — I428 Other cardiomyopathies: Secondary | ICD-10-CM

## 2010-11-28 MED ORDER — CARVEDILOL 12.5 MG PO TABS: 12.5000 mg | ORAL_TABLET | Freq: Two times a day (BID) | ORAL | Status: DC

## 2010-11-28 NOTE — Assessment & Plan Note (Signed)
Continue statin. 

## 2010-11-28 NOTE — Patient Instructions (Signed)
Your physician wants you to follow-up in: 6 MONTHS You will receive a reminder letter in the mail two months in advance. If you don't receive a letter, please call our office to schedule the follow-up appointment.   INCREASE CARVEDILOL TO 12.5 MG TWICE DAILY  Your physician has requested that you have an abdominal aorta duplex. During this test, an ultrasound is used to evaluate the aorta. Allow 30 minutes for this exam. Do not eat after midnight the day before and avoid carbonated beverages

## 2010-11-28 NOTE — Assessment & Plan Note (Signed)
Patient counseled on discontinuing. 

## 2010-11-28 NOTE — Progress Notes (Signed)
HPI: Pleasant female with coronary artery disease and cardiomyopathy for fu.  A cardiac catheterization was performed on September 21 of 2010. This revealed left main with 50% eccentric distal stenosis, left anterior descending coronary artery 60% mid tubular lesion (does not appear to be flow limiting), circumflex occluded in its mid portion and distal circ appears to fill from septal collaterals, right coronary artery appeared to be a small and likely nondominant, subtotally occluded in the mid vessel with faint filling of the distal vessel.  Left ventricular ejection fraction in the 40% range.  Medical management recommended. An echocardiogram during that admission revealed an ejection fraction of 20-25%, mild aortic and mitral regurgitation and right ventricular enlargement.  Repeat echocardiogram in Feb of 2012  revealed an ejection fraction of 25 - 30%, mild LAE and mild AI and MR. She was seen by Dr. Ladona Ridgel and ICD recommended. I last saw her in Feb 2012. Since then, she does have dyspnea on exertion but this improves with inhalers. No orthopnea, PND, pedal edema or syncope. No chest pain.  Current Outpatient Prescriptions  Medication Sig Dispense Refill  . acetaminophen (TYLENOL) 325 MG tablet Take 650 mg by mouth every 6 (six) hours as needed. For pain      . albuterol (PROVENTIL HFA;VENTOLIN HFA) 108 (90 BASE) MCG/ACT inhaler Inhale 1-2 puffs into the lungs every 6 (six) hours as needed for wheezing.  1 Inhaler  0  . carvedilol (COREG) 6.25 MG tablet Take 1 tablet (6.25 mg total) by mouth 2 (two) times daily with a meal.  180 tablet  2  . ferrous sulfate 325 (65 FE) MG EC tablet Take 325 mg by mouth daily.        Marland Kitchen lisinopril (PRINIVIL,ZESTRIL) 40 MG tablet Take 40 mg by mouth daily.        . nitroGLYCERIN (NITROSTAT) 0.4 MG SL tablet Place 0.4 mg under the tongue every 5 (five) minutes as needed. For chest pain      . Oxybutynin (GELNIQUE) 3 (28) % (MG/ACT) GEL Place onto the skin daily.          . pravastatin (PRAVACHOL) 20 MG tablet Take 20 mg by mouth at bedtime.          Past Medical History  Diagnosis Date  . Pure hypercholesterolemia   . CAD   . Chronic systolic heart failure   . COPD   . Ischemic cardiomyopathy     Past Surgical History  Procedure Date  . Tumor removed     History   Social History  . Marital Status: Single    Spouse Name: N/A    Number of Children: N/A  . Years of Education: N/A   Occupational History  . Not on file.   Social History Main Topics  . Smoking status: Current Everyday Smoker  . Smokeless tobacco: Not on file  . Alcohol Use: Yes     socially  . Drug Use: No  . Sexually Active: Not on file   Other Topics Concern  . Not on file   Social History Narrative  . No narrative on file    ROS: no fevers or chills, productive cough, hemoptysis, dysphasia, odynophagia, melena, hematochezia, dysuria, hematuria, rash, seizure activity, orthopnea, PND, pedal edema, claudication. Remaining systems are negative.  Physical Exam: Well-developed frail in no acute distress.  Skin is warm and dry.  HEENT is normal.  Neck is supple. No thyromegaly.  Chest is diminished breath sounds throughout. Cardiovascular exam is regular rate and  rhythm.  Abdominal exam nontender or distended. Question pulsatile mass. Positive bruit. Extremities show no edema. neuro grossly intact  ECG sinus rhythm at a rate of 64. Left ventricular hypertrophy. Prolonged QT interval. Nonspecific T-wave changes.

## 2010-11-28 NOTE — Assessment & Plan Note (Signed)
Schedule abdominal ultrasound to exclude aneurysm. 

## 2010-11-28 NOTE — Assessment & Plan Note (Signed)
Continue aspirin and statin. 

## 2010-11-28 NOTE — Assessment & Plan Note (Signed)
Continue ACE inhibitor. Increase Coreg to 12.5 mg p.o. B.i.d. Patient remains hesitant to proceed with ICD. She understands risks of sudden death.

## 2010-12-19 ENCOUNTER — Encounter (INDEPENDENT_AMBULATORY_CARE_PROVIDER_SITE_OTHER): Payer: PRIVATE HEALTH INSURANCE | Admitting: Cardiology

## 2010-12-19 DIAGNOSIS — R0989 Other specified symptoms and signs involving the circulatory and respiratory systems: Secondary | ICD-10-CM

## 2010-12-19 DIAGNOSIS — I700 Atherosclerosis of aorta: Secondary | ICD-10-CM

## 2011-01-31 ENCOUNTER — Encounter: Payer: Self-pay | Admitting: Internal Medicine

## 2011-01-31 ENCOUNTER — Ambulatory Visit (INDEPENDENT_AMBULATORY_CARE_PROVIDER_SITE_OTHER): Payer: PRIVATE HEALTH INSURANCE | Admitting: Internal Medicine

## 2011-01-31 ENCOUNTER — Telehealth: Payer: Self-pay | Admitting: Internal Medicine

## 2011-01-31 VITALS — BP 130/70 | HR 70 | Temp 97.8°F | Ht 64.0 in | Wt 85.0 lb

## 2011-01-31 DIAGNOSIS — J40 Bronchitis, not specified as acute or chronic: Secondary | ICD-10-CM

## 2011-01-31 DIAGNOSIS — J449 Chronic obstructive pulmonary disease, unspecified: Secondary | ICD-10-CM

## 2011-01-31 MED ORDER — AZITHROMYCIN 250 MG PO TABS: ORAL_TABLET | ORAL | Status: DC

## 2011-01-31 MED ORDER — ALBUTEROL SULFATE (2.5 MG/3ML) 0.083% IN NEBU
2.5000 mg | INHALATION_SOLUTION | Freq: Once | RESPIRATORY_TRACT | Status: AC
  Administered 2011-01-31: 2.5 mg via RESPIRATORY_TRACT

## 2011-01-31 MED ORDER — ALBUTEROL SULFATE HFA 108 (90 BASE) MCG/ACT IN AERS: 1.0000 | INHALATION_SPRAY | Freq: Four times a day (QID) | RESPIRATORY_TRACT | Status: DC | PRN

## 2011-01-31 MED ORDER — BUDESONIDE-FORMOTEROL FUMARATE 160-4.5 MCG/ACT IN AERO: 2.0000 | INHALATION_SPRAY | Freq: Two times a day (BID) | RESPIRATORY_TRACT | Status: DC

## 2011-01-31 MED ORDER — CEFTRIAXONE SODIUM 1 G IJ SOLR
500.0000 mg | Freq: Once | INTRAMUSCULAR | Status: AC
  Administered 2011-01-31: 500 mg via INTRAMUSCULAR

## 2011-01-31 MED ORDER — PREDNISONE 20 MG PO TABS: ORAL_TABLET | ORAL | Status: DC

## 2011-01-31 MED ORDER — METHYLPREDNISOLONE ACETATE PF 80 MG/ML IJ SUSP
80.0000 mg | Freq: Once | INTRAMUSCULAR | Status: AC
  Administered 2011-01-31: 80 mg via INTRAMUSCULAR

## 2011-01-31 NOTE — Telephone Encounter (Signed)
Pt called wanting an appointment asap. She states her bronchitis is acting up and is SOB. Best contact number is [phone].

## 2011-01-31 NOTE — Telephone Encounter (Signed)
Spoke with pt.  She states that she does not feel like she ever really got better from ED visit in October.  She states that when she coughs, the mucous is too thick to cough up, she feels like her chest is tight with mucous.  She does not want to go back to ED, but may no other choice.  Will work in today at 345pm, pt is agreeable

## 2011-01-31 NOTE — Progress Notes (Signed)
Subjective:    Patient ID: Tonya Harper, female    DOB: 06-Oct-1945, 66 y.o.   MRN: [id number]  HPI New pt. Here for first visit.  Primary care has been at ER and urgent care.  Also sees Dr. Jens Som and Dr. Sabino Gasser urology.  PMH of CAD with ischemic cardiomyopathy EF 25-30% awaiting ICD, COPD with current tobacco use, long standing anemia, mixed urinary incontinence,  Htn, and Abd bruit (awaiting eval)  .    She presents with acute visit.  She has increasing chest congestion and now DOE, SOB.   She denies chest pain or palpitations.  She reports she is only on albuterol which she uses sparingly due to finances and has never been evaluated for her chronic lung disease .   She still smokes and believes she has bronchitis.  Treated in  ER 10/12 for bronchitis.  She is coughing green sputum and wheezes at times.  She has never been on Prednisone.   Allergies  Allergen Reactions  . Aspirin     nausea and dizziness  . Shellfish Allergy     Medication withdrawal symptoms   Past Medical History  Diagnosis Date  . Pure hypercholesterolemia   . CAD   . Chronic systolic heart failure   . COPD   . Ischemic cardiomyopathy   . Anemia   . Hypertension   . Urge incontinence of urine    Past Surgical History  Procedure Date  . Tumor removed   . Abdominal hysterectomy    History   Social History  . Marital Status: Single    Spouse Name: N/A    Number of Children: N/A  . Years of Education: N/A   Occupational History  . Not on file.   Social History Main Topics  . Smoking status: Current Everyday Smoker -- 0.5 packs/day    Types: Cigarettes  . Smokeless tobacco: Not on file  . Alcohol Use: Yes     socially  . Drug Use: No  . Sexually Active: Yes    Birth Control/ Protection: Surgical   Other Topics Concern  . Not on file   Social History Narrative  . No narrative on file   Family History  Problem Relation Age of Onset  . Heart attack Father   . Early death Father 42  .  Stroke Father   . Heart disease Father   . Coronary artery disease Brother   . Heart disease Brother     MI @ 62  . Heart disease Mother   . Heart disease Sister     MI @ 109  . Heart disease Brother     MI in 23s   Patient Active Problem List  Diagnoses  . PURE HYPERCHOLESTEROLEMIA  . HYPOKALEMIA  . OTHER SPECIFIED ANEMIAS  . TOBACCO ABUSE  . CAD  . CARDIOMYOPATHY  . CHRONIC SYSTOLIC HEART FAILURE  . COPD  . BRONCHITIS  . OTHER SPEC FORMS CHRONIC ISCHEMIC HEART DISEASE  . Bruit   Current Outpatient Prescriptions on File Prior to Visit  Medication Sig Dispense Refill  . acetaminophen (TYLENOL) 325 MG tablet Take 650 mg by mouth every 6 (six) hours as needed. For pain      . carvedilol (COREG) 12.5 MG tablet Take 1 tablet (12.5 mg total) by mouth 2 (two) times daily with a meal.  180 tablet  3  . ferrous sulfate 325 (65 FE) MG EC tablet Take 325 mg by mouth daily.        Marland Kitchen  lisinopril (PRINIVIL,ZESTRIL) 40 MG tablet Take 40 mg by mouth daily.        . nitroGLYCERIN (NITROSTAT) 0.4 MG SL tablet Place 0.4 mg under the tongue every 5 (five) minutes as needed. For chest pain      . Oxybutynin (GELNIQUE) 3 (28) % (MG/ACT) GEL Place onto the skin daily.        . pravastatin (PRAVACHOL) 20 MG tablet Take 20 mg by mouth at bedtime.        No current facility-administered medications on file prior to visit.       Review of Systems see HPI   Objective:   Physical Exam Physical Exam  Nursing note and vitals reviewed.   Peak flow 100  Pulse ox 88%.  Tobacco smell  Constitutional: She is oriented to person, place, and time.  Thin asthenic.  HENT:  Head: Normocephalic and atraumatic.  Cardiovascular: Normal rate and regular rhythm. Exam reveals no gallop and no friction rub.  No murmur heard.  Pulmonary/Chest: Breath sounds decreased.  FEw rhonchii and wheezing She has no rales.  Neurological: She is alert and oriented to person, place, and time.  Skin: Skin is warm and dry.    Psychiatric: She has a normal mood and affect. Her behavior is normal.       Assessment & Plan:  1)  Advanced COPD with bronchitis vs pneumonia.  Will give alb. Neb in office with  Rocephin 500 mg and Depo medrol 80 mg in office.  Rx Prednisone 60 mg taper q 3 days.  RX  Symbicort 160     2 inhalations bid with Z-pak.  Will need referral to pulmonologist for eval. for advanced disease.  Pt to return in 4 days.  I advised that this is the maximum treatment I can do for her in the office and if she further deteriorates she is to seek Er treatment.  She voices understanding 2)  CAD cardiomyopathy  ICD pending 3)  H/o anemia 4)  HTN 5)  ABD bruit  Pending eval 6)  Tobacco use -  She has been counseled on multiple occasions to stop smoking.  She is not willing to quit now

## 2011-01-31 NOTE — Patient Instructions (Addendum)
Take 2 inhalations of Symbicort twice a day  Take Prednisone and antibiotic as prescribed  See me on Monday  If any worsening shortness of breath or chest pain go to emergency room for evaluation

## 2011-02-04 ENCOUNTER — Ambulatory Visit (INDEPENDENT_AMBULATORY_CARE_PROVIDER_SITE_OTHER): Payer: PRIVATE HEALTH INSURANCE | Admitting: Internal Medicine

## 2011-02-04 ENCOUNTER — Encounter: Payer: Self-pay | Admitting: Internal Medicine

## 2011-02-04 DIAGNOSIS — J449 Chronic obstructive pulmonary disease, unspecified: Secondary | ICD-10-CM

## 2011-02-04 DIAGNOSIS — J40 Bronchitis, not specified as acute or chronic: Secondary | ICD-10-CM

## 2011-02-04 DIAGNOSIS — I428 Other cardiomyopathies: Secondary | ICD-10-CM

## 2011-02-04 DIAGNOSIS — F17200 Nicotine dependence, unspecified, uncomplicated: Secondary | ICD-10-CM

## 2011-02-04 NOTE — Patient Instructions (Signed)
Keep appt with Dr. Delford Field  Continue medicatons as counseled

## 2011-02-04 NOTE — Progress Notes (Signed)
Subjective:    Patient ID: Tonya Harper, female    DOB: 02-Feb-1945, 66 y.o.   MRN: [id number]  HPI Tonya Harper is here for follow up.  Report she is feeling 75% better.  She is on symbicort now bid  and 40 mg of Prednisone taper.   She has finished Z pak now   She reports she has only quit tobacco one time but relapsed.  Less coughing and less dyspnea.  She uses Albuterol once or twice daily.  Trying to use sparingly due to finances.  She has had an influenza vaccine.   Allergies  Allergen Reactions  . Aspirin     nausea and dizziness  . Shellfish Allergy     Medication withdrawal symptoms   Past Medical History  Diagnosis Date  . Pure hypercholesterolemia   . CAD   . Chronic systolic heart failure   . COPD   . Ischemic cardiomyopathy   . Anemia   . Hypertension   . Urge incontinence of urine    Past Surgical History  Procedure Date  . Tumor removed   . Abdominal hysterectomy    History   Social History  . Marital Status: Single    Spouse Name: N/A    Number of Children: N/A  . Years of Education: N/A   Occupational History  . Not on file.   Social History Main Topics  . Smoking status: Current Everyday Smoker -- 0.5 packs/day    Types: Cigarettes  . Smokeless tobacco: Not on file  . Alcohol Use: Yes     socially  . Drug Use: No  . Sexually Active: Yes    Birth Control/ Protection: Surgical   Other Topics Concern  . Not on file   Social History Narrative  . No narrative on file   Family History  Problem Relation Age of Onset  . Heart attack Father   . Early death Father 89  . Stroke Father   . Heart disease Father   . Coronary artery disease Brother   . Heart disease Brother     MI @ 75  . Heart disease Mother   . Heart disease Sister     MI @ 26  . Heart disease Brother     MI in 51s   Patient Active Problem List  Diagnoses  . PURE HYPERCHOLESTEROLEMIA  . HYPOKALEMIA  . OTHER SPECIFIED ANEMIAS  . TOBACCO ABUSE  . CAD  . CARDIOMYOPATHY  .  CHRONIC SYSTOLIC HEART FAILURE  . COPD  . BRONCHITIS  . OTHER SPEC FORMS CHRONIC ISCHEMIC HEART DISEASE  . Bruit   Current Outpatient Prescriptions on File Prior to Visit  Medication Sig Dispense Refill  . acetaminophen (TYLENOL) 325 MG tablet Take 650 mg by mouth every 6 (six) hours as needed. For pain      . albuterol (PROVENTIL HFA;VENTOLIN HFA) 108 (90 BASE) MCG/ACT inhaler Inhale 1-2 puffs into the lungs every 6 (six) hours as needed for wheezing.  1 Inhaler  0  . budesonide-formoterol (SYMBICORT) 160-4.5 MCG/ACT inhaler Inhale 2 puffs into the lungs 2 (two) times daily.  1 Inhaler  3  . carvedilol (COREG) 12.5 MG tablet Take 1 tablet (12.5 mg total) by mouth 2 (two) times daily with a meal.  180 tablet  3  . ferrous sulfate 325 (65 FE) MG EC tablet Take 325 mg by mouth daily.        Marland Kitchen lisinopril (PRINIVIL,ZESTRIL) 40 MG tablet Take 40 mg by mouth daily.        Marland Kitchen  nitroGLYCERIN (NITROSTAT) 0.4 MG SL tablet Place 0.4 mg under the tongue every 5 (five) minutes as needed. For chest pain      . Oxybutynin (GELNIQUE) 3 (28) % (MG/ACT) GEL Place onto the skin daily.        . pravastatin (PRAVACHOL) 20 MG tablet Take 20 mg by mouth at bedtime.       . predniSONE (DELTASONE) 20 MG tablet Take 3 tabs daily for 3 days then 2 tabs daily for 3 days then 1 tab daily for 3 days then stop  18 tablet  0     Review of Systems See HPI    Objective:   Physical Exam Physical Exam  Nursing note and vitals reviewed.  Peak flow 125,  Last week 100 Constitutional: She is oriented to person, place, and time. Thin , asthenic pleasant  HENT:  Head: Normocephalic and atraumatic.  Cardiovascular: Normal rate and regular rhythm. Exam reveals no gallop and no friction rub.  No murmur heard.  Pulmonary/Chest:  Slightly improved air flow. She has no wheezes. She has no rales.  Neurological: She is alert and oriented to person, place, and time.  Skin: Skin is warm and dry.  Psychiatric: She has a normal mood  and affect. Her behavior is normal.        Assessment & Plan:  1)  COPD exacerbation bronchitis  She has never had formal PFT"S or pulmonary eval.  Long time smoker.  Clinically advanced disease.  She has upcoming  appt with Tonya Harper for eval .  Continue Symbicort, albuterol rescue and Prednisone taper.   2)  Tobacco use:  Willing to go to quit smart classes now.  Gave number to register and told there is   A new class that starts Tuesday  She is to follow up with me in 2-3 weeks  Multiple CV issues followed by Tonya Harper.  See problem list.  She is still debating ICD placment

## 2011-02-07 ENCOUNTER — Ambulatory Visit (INDEPENDENT_AMBULATORY_CARE_PROVIDER_SITE_OTHER): Payer: PRIVATE HEALTH INSURANCE | Admitting: Critical Care Medicine

## 2011-02-07 ENCOUNTER — Encounter: Payer: Self-pay | Admitting: Critical Care Medicine

## 2011-02-07 ENCOUNTER — Ambulatory Visit (HOSPITAL_BASED_OUTPATIENT_CLINIC_OR_DEPARTMENT_OTHER)
Admission: RE | Admit: 2011-02-07 | Discharge: 2011-02-07 | Disposition: A | Discharge status: Home / Self Care | Payer: Commercial Indemnity | Source: Ambulatory Visit | Attending: Critical Care Medicine | Admitting: Critical Care Medicine

## 2011-02-07 VITALS — BP 162/70 | HR 65 | Temp 98.3°F | Ht 64.0 in | Wt 89.0 lb

## 2011-02-07 DIAGNOSIS — J449 Chronic obstructive pulmonary disease, unspecified: Secondary | ICD-10-CM

## 2011-02-07 DIAGNOSIS — R0602 Shortness of breath: Secondary | ICD-10-CM | POA: Insufficient documentation

## 2011-02-07 MED ORDER — NICOTINE 10 MG IN INHA: RESPIRATORY_TRACT | Status: DC

## 2011-02-07 NOTE — Progress Notes (Signed)
Subjective:    Patient ID: Tonya Harper, female    DOB: [birth year], 66 y.o.   MRN: [id number]  HPI Comments: Hx of dyspnea.  Dx bronchitis in the past, will flare twice a year.  Current flare since 10/12.  Now is a bit better, not back to 100%  Shortness of Breath This is a chronic problem. The current episode started more than 1 month ago. The problem occurs daily (exertional now , was at rest before , before Rx). The problem has been gradually improving. Associated symptoms include headaches, leg swelling, sputum production and wheezing. Pertinent negatives include no abdominal pain, chest pain, claudication, coryza, ear pain, fever, hemoptysis, leg pain, neck pain, orthopnea, PND, rash, rhinorrhea, sore throat, swollen glands, syncope or vomiting. The symptoms are aggravated by URIs, any activity, fumes, odors and weather changes. Associated symptoms comments: Did have chest tightness Mucus now is clear, at first was sl yellow-green Nasal congestion now is better . Risk factors include smoking. She has tried beta agonist inhalers, steroid inhalers and oral steroids for the symptoms. The treatment provided moderate relief. Her past medical history is significant for allergies, CAD, COPD and a heart failure. There is no history of aspirin allergies, asthma, bronchiolitis, chronic lung disease, DVT, PE, pneumonia or a recent surgery.   66 y.o. WF   Past Medical History  Diagnosis Date  . Pure hypercholesterolemia   . CAD   . Chronic systolic heart failure   . COPD   . Ischemic cardiomyopathy   . Anemia   . Hypertension   . Urge incontinence of urine      Family History  Problem Relation Age of Onset  . Heart attack Father   . Early death Father 40  . Stroke Father   . Heart disease Father   . Coronary artery disease Brother   . Heart disease Brother     MI @ 37  . Heart disease Mother   . Heart disease Sister     MI @ 31  . Heart disease Brother     MI in 22s     History    Social History  . Marital Status: Single    Spouse Name: N/A    Number of Children: N/A  . Years of Education: N/A   Occupational History  . unemployed    Social History Main Topics  . Smoking status: Current Everyday Smoker -- 0.5 packs/day for 40 years    Types: Cigarettes  . Smokeless tobacco: Not on file  . Alcohol Use: Yes     socially  . Drug Use: No  . Sexually Active: Yes    Birth Control/ Protection: Surgical   Other Topics Concern  . Not on file   Social History Narrative  . No narrative on file     Allergies  Allergen Reactions  . Aspirin     nausea and dizziness  . Shellfish Allergy     Medication withdrawal symptoms     Outpatient Prescriptions Prior to Visit  Medication Sig Dispense Refill  . acetaminophen (TYLENOL) 325 MG tablet Take 650 mg by mouth every 6 (six) hours as needed. For pain      . albuterol (PROVENTIL HFA;VENTOLIN HFA) 108 (90 BASE) MCG/ACT inhaler Inhale 1-2 puffs into the lungs every 6 (six) hours as needed for wheezing.  1 Inhaler  0  . budesonide-formoterol (SYMBICORT) 160-4.5 MCG/ACT inhaler Inhale 2 puffs into the lungs 2 (two) times daily.  1 Inhaler  3  . carvedilol (  COREG) 12.5 MG tablet Take 1 tablet (12.5 mg total) by mouth 2 (two) times daily with a meal.  180 tablet  3  . lisinopril (PRINIVIL,ZESTRIL) 40 MG tablet Take 40 mg by mouth daily.        . nitroGLYCERIN (NITROSTAT) 0.4 MG SL tablet Place 0.4 mg under the tongue every 5 (five) minutes as needed. For chest pain      . Oxybutynin (GELNIQUE) 3 (28) % (MG/ACT) GEL Place onto the skin daily.        . pravastatin (PRAVACHOL) 20 MG tablet Take 20 mg by mouth at bedtime.       . ferrous sulfate 325 (65 FE) MG EC tablet Take 325 mg by mouth daily.        . predniSONE (DELTASONE) 20 MG tablet Take 3 tabs daily for 3 days then 2 tabs daily for 3 days then 1 tab daily for 3 days then stop  18 tablet  0     Review of Systems  Constitutional: Positive for fatigue. Negative  for fever, chills, diaphoresis, activity change, appetite change and unexpected weight change.  HENT: Positive for congestion, trouble swallowing and postnasal drip. Negative for hearing loss, ear pain, nosebleeds, sore throat, facial swelling, rhinorrhea, sneezing, mouth sores, neck pain, neck stiffness, dental problem, voice change, sinus pressure, tinnitus and ear discharge.   Eyes: Negative for photophobia, discharge, redness, itching and visual disturbance.  Respiratory: Positive for cough, sputum production, chest tightness, shortness of breath and wheezing. Negative for apnea, hemoptysis, choking and stridor.   Cardiovascular: Positive for leg swelling. Negative for chest pain, palpitations, orthopnea, claudication, syncope and PND.  Gastrointestinal: Negative for nausea, vomiting, abdominal pain, constipation, blood in stool and abdominal distention.  Genitourinary: Negative for dysuria, urgency, frequency, hematuria, flank pain, decreased urine volume and difficulty urinating.  Musculoskeletal: Negative for myalgias, back pain, joint swelling, arthralgias and gait problem.  Skin: Negative for color change, pallor and rash.  Neurological: Positive for headaches. Negative for dizziness, tremors, seizures, syncope, speech difficulty, weakness, light-headedness and numbness.  Hematological: Negative for adenopathy. Does not bruise/bleed easily.  Psychiatric/Behavioral: Negative for confusion, sleep disturbance, dysphoric mood and agitation. The patient is not nervous/anxious.        Objective:   Physical Exam Filed Vitals:   02/07/11 1058  BP: 162/70  Pulse: 65  Temp: 98.3 F (36.8 C)  TempSrc: Oral  Height: 5\' 4"  (1.626 m)  Weight: 89 lb (40.37 kg)  SpO2: 96%    Gen: Pleasant, well-nourished, in no distress,  normal affect  ENT: No lesions,  mouth clear,  oropharynx clear, no postnasal drip  Neck: No JVD, no TMG, no carotid bruits  Lungs: No use of accessory muscles, no  dullness to percussion,distant BS without rales or rhonchi  Cardiovascular: RRR, heart sounds normal, no murmur or gallops, no peripheral edema  Abdomen: soft and NT, no HSM,  BS normal  Musculoskeletal: No deformities, no cyanosis or clubbing  Neuro: alert, non focal  Skin: Warm, no lesions or rashes      CXR 10/12 Findings: The cardiac shadow is stable. No pulmonary vascular  congestion is seen. The lungs are hyperinflated consistent with  COPD. Postsurgical changes are noted in the mediastinum. No acute  bony abnormality is seen.  IMPRESSION: COPD without acute abnormality.      Assessment & Plan:   COPD Advanced Copd with ongoing tobacco use and recent exacerbation Note CXR 02/07/11 showed copd changes and NAD Plan Stay on symbicort two puff  twice daily Stop smoking ,use Nicotrol inhaler  Pulmonary function testing to be scheduled Alpha one antitrypsin level was checked Return 2 months     Updated Medication List Outpatient Encounter Prescriptions as of 02/07/2011  Medication Sig Dispense Refill  . acetaminophen (TYLENOL) 325 MG tablet Take 650 mg by mouth every 6 (six) hours as needed. For pain      . albuterol (PROVENTIL HFA;VENTOLIN HFA) 108 (90 BASE) MCG/ACT inhaler Inhale 1-2 puffs into the lungs every 6 (six) hours as needed for wheezing.  1 Inhaler  0  . budesonide-formoterol (SYMBICORT) 160-4.5 MCG/ACT inhaler Inhale 2 puffs into the lungs 2 (two) times daily.  1 Inhaler  3  . carvedilol (COREG) 12.5 MG tablet Take 1 tablet (12.5 mg total) by mouth 2 (two) times daily with a meal.  180 tablet  3  . lisinopril (PRINIVIL,ZESTRIL) 40 MG tablet Take 40 mg by mouth daily.        . nitroGLYCERIN (NITROSTAT) 0.4 MG SL tablet Place 0.4 mg under the tongue every 5 (five) minutes as needed. For chest pain      . Oxybutynin (GELNIQUE) 3 (28) % (MG/ACT) GEL Place onto the skin daily.        . pravastatin (PRAVACHOL) 20 MG tablet Take 20 mg by mouth at bedtime.       .  predniSONE (DELTASONE) 20 MG tablet Take 3 tabs daily for 3 days then 2 tabs daily for 3 days then 1 tab daily for 3 days then stop      . nicotine (NICOTROL) 10 MG inhaler 60 - 80 puffs per cartridge  6-8 cartridges per day  168 each  2  . DISCONTD: ferrous sulfate 325 (65 FE) MG EC tablet Take 325 mg by mouth daily.        Marland Kitchen DISCONTD: predniSONE (DELTASONE) 20 MG tablet Take 3 tabs daily for 3 days then 2 tabs daily for 3 days then 1 tab daily for 3 days then stop  18 tablet  0

## 2011-02-07 NOTE — Patient Instructions (Addendum)
Stay on symbicort two puff twice daily Stop smoking ,use Nicotrol inhaler  Chest xray today Pulmonary function testing to be scheduled Alpha one antitrypsin level was checked Return 2 months

## 2011-02-07 NOTE — Progress Notes (Signed)
Quick Note:  Notify the patient that the Xray is stable and no pneumonia, it does show emphysema. No change in medications are recommended. Continue current meds as prescribed at last office visit ______

## 2011-02-07 NOTE — Assessment & Plan Note (Signed)
Advanced Copd with ongoing tobacco use and recent exacerbation Note CXR 02/07/11 showed copd changes and NAD Plan Stay on symbicort two puff twice daily Stop smoking ,use Nicotrol inhaler  Pulmonary function testing to be scheduled Alpha one antitrypsin level was checked Return 2 months

## 2011-02-11 ENCOUNTER — Other Ambulatory Visit: Payer: Self-pay

## 2011-02-11 ENCOUNTER — Encounter (HOSPITAL_BASED_OUTPATIENT_CLINIC_OR_DEPARTMENT_OTHER): Payer: Self-pay | Admitting: *Deleted

## 2011-02-11 ENCOUNTER — Emergency Department (HOSPITAL_BASED_OUTPATIENT_CLINIC_OR_DEPARTMENT_OTHER)
Admission: EM | Admit: 2011-02-11 | Discharge: 2011-02-11 | Disposition: A | Discharge status: Home / Self Care | Payer: 59 | Attending: Emergency Medicine | Admitting: Emergency Medicine

## 2011-02-11 ENCOUNTER — Emergency Department (INDEPENDENT_AMBULATORY_CARE_PROVIDER_SITE_OTHER): Payer: 59

## 2011-02-11 DIAGNOSIS — I2510 Atherosclerotic heart disease of native coronary artery without angina pectoris: Secondary | ICD-10-CM | POA: Insufficient documentation

## 2011-02-11 DIAGNOSIS — E7800 Pure hypercholesterolemia, unspecified: Secondary | ICD-10-CM | POA: Insufficient documentation

## 2011-02-11 DIAGNOSIS — R059 Cough, unspecified: Secondary | ICD-10-CM

## 2011-02-11 DIAGNOSIS — Z79899 Other long term (current) drug therapy: Secondary | ICD-10-CM | POA: Insufficient documentation

## 2011-02-11 DIAGNOSIS — R0602 Shortness of breath: Secondary | ICD-10-CM | POA: Insufficient documentation

## 2011-02-11 DIAGNOSIS — F17200 Nicotine dependence, unspecified, uncomplicated: Secondary | ICD-10-CM | POA: Insufficient documentation

## 2011-02-11 DIAGNOSIS — J40 Bronchitis, not specified as acute or chronic: Secondary | ICD-10-CM

## 2011-02-11 DIAGNOSIS — R079 Chest pain, unspecified: Secondary | ICD-10-CM | POA: Insufficient documentation

## 2011-02-11 DIAGNOSIS — J449 Chronic obstructive pulmonary disease, unspecified: Secondary | ICD-10-CM

## 2011-02-11 DIAGNOSIS — J4489 Other specified chronic obstructive pulmonary disease: Secondary | ICD-10-CM | POA: Insufficient documentation

## 2011-02-11 DIAGNOSIS — R05 Cough: Secondary | ICD-10-CM

## 2011-02-11 DIAGNOSIS — I10 Essential (primary) hypertension: Secondary | ICD-10-CM | POA: Insufficient documentation

## 2011-02-11 LAB — CBC
HCT: 38.1 % (ref 36.0–46.0)
Hemoglobin: 12.8 g/dL (ref 12.0–15.0)
MCH: 29.4 pg (ref 26.0–34.0)
MCHC: 33.6 g/dL (ref 30.0–36.0)
MCV: 87.6 fL (ref 78.0–100.0)
Platelets: 220 10*3/uL (ref 150–400)
RBC: 4.35 MIL/uL (ref 3.87–5.11)
RDW: 14 % (ref 11.5–15.5)
WBC: 15.5 10*3/uL — ABNORMAL HIGH (ref 4.0–10.5)

## 2011-02-11 LAB — CARDIAC PANEL(CRET KIN+CKTOT+MB+TROPI)
CK, MB: 3.4 ng/mL (ref 0.3–4.0)
CK, MB: 3 ng/mL (ref 0.3–4.0)
Relative Index: INVALID (ref 0.0–2.5)
Relative Index: INVALID (ref 0.0–2.5)
Total CK: 58 U/L (ref 7–177)
Total CK: 50 U/L (ref 7–177)
Troponin I: <0.3 ng/mL (ref <0.30)
Troponin I: <0.3 ng/mL (ref <0.30)

## 2011-02-11 LAB — BASIC METABOLIC PANEL
BUN: 7 mg/dL (ref 6–23)
CO2: 35 mEq/L — ABNORMAL HIGH (ref 19–32)
Calcium: 8.9 mg/dL (ref 8.4–10.5)
Chloride: 100 mEq/L (ref 96–112)
Creatinine, Ser: 0.7 mg/dL (ref 0.50–1.10)
GFR calc Af Amer: >90 mL/min (ref >90)
GFR calc non Af Amer: 90 mL/min — ABNORMAL LOW (ref >90)
Glucose, Bld: 95 mg/dL (ref 70–99)
Potassium: 3.2 mEq/L — ABNORMAL LOW (ref 3.5–5.1)
Sodium: 141 mEq/L (ref 135–145)

## 2011-02-11 LAB — DIFFERENTIAL
Eosinophils Absolute: 0.2 10*3/uL (ref 0.0–0.7)
Eosinophils Relative: 1 % (ref 0–5)
Lymphocytes Relative: 15 % (ref 12–46)
Lymphs Abs: 2.3 10*3/uL (ref 0.7–4.0)
Monocytes Absolute: 1.4 10*3/uL — ABNORMAL HIGH (ref 0.1–1.0)
Monocytes Relative: 9 % (ref 3–12)
Neutro Abs: 11.6 10*3/uL — ABNORMAL HIGH (ref 1.7–7.7)
Neutrophils Relative %: 75 % (ref 43–77)

## 2011-02-11 MED ORDER — ASPIRIN 81 MG PO CHEW
324.0000 mg | CHEWABLE_TABLET | Freq: Once | ORAL | Status: AC
  Administered 2011-02-11: 324 mg via ORAL
  Filled 2011-02-11: qty 4

## 2011-02-11 MED ORDER — PREDNISONE 10 MG PO TABS: ORAL_TABLET | ORAL | Status: DC

## 2011-02-11 MED ORDER — NITROGLYCERIN 0.4 MG SL SUBL
0.4000 mg | SUBLINGUAL_TABLET | SUBLINGUAL | Status: DC | PRN
  Filled 2011-02-11: qty 25

## 2011-02-11 MED ORDER — ALBUTEROL SULFATE HFA 108 (90 BASE) MCG/ACT IN AERS
2.0000 | INHALATION_SPRAY | RESPIRATORY_TRACT | Status: DC | PRN
  Administered 2011-02-11: 2 via RESPIRATORY_TRACT
  Filled 2011-02-11: qty 6.7

## 2011-02-11 MED ORDER — ALBUTEROL SULFATE HFA 108 (90 BASE) MCG/ACT IN AERS: 2.0000 | INHALATION_SPRAY | Freq: Once | RESPIRATORY_TRACT | Status: DC

## 2011-02-11 MED ORDER — METHYLPREDNISOLONE SODIUM SUCC 125 MG IJ SOLR
125.0000 mg | Freq: Once | INTRAMUSCULAR | Status: AC
  Administered 2011-02-11: 125 mg via INTRAVENOUS
  Filled 2011-02-11: qty 2

## 2011-02-11 NOTE — ED Notes (Signed)
Patient states she has a history of bronchitis since Oct 2012.  Has been followed by Dr. Delford Field.  States for the last 2 weeks has had an increase in her symptoms, was seen by Dr. Delford Field last Thursday.  States she was exposed to someone with the flu on Saturday and her sob has increased.  Has tightness in her chest that started yesterday and has a history of same when her bronchitis flares up.

## 2011-02-11 NOTE — ED Provider Notes (Signed)
History     CSN: [id number]  Arrival date & time 02/11/11  1208   First MD Initiated Contact with Patient 02/11/11 1303      Chief Complaint  Patient presents with  . Shortness of Breath    chest tightness    (Consider location/radiation/quality/duration/timing/severity/associated sxs/prior treatment) Patient is a 66 y.o. female presenting with shortness of breath. The history is provided by the patient. No language interpreter was used.  Shortness of Breath  The current episode started today. The problem occurs frequently. The problem has been unchanged. The problem is moderate. The symptoms are relieved by nothing. The symptoms are aggravated by nothing. Associated symptoms include chest pain, rhinorrhea and shortness of breath. Her temperature was unmeasured prior to arrival. The cough has no precipitants. The cough is non-productive. There is no color change associated with the cough. Nothing relieves the cough. Nothing worsens the cough. There was no intake of a foreign body. Her past medical history does not include asthma or bronchiolitis. She has been behaving normally. Urine output has been normal. There were no sick contacts. Recently, medical care has been given by a specialist.  Pt has a history of Copd.  Pt recently saw Pulmonary MD.  Pt reports today she felt some tightness in her chest and felt short of breath.  Pt denies fever or chills,  Pt reports she feels better now.   Past Medical History  Diagnosis Date  . Pure hypercholesterolemia   . CAD   . Chronic systolic heart failure   . COPD   . Ischemic cardiomyopathy   . Anemia   . Hypertension   . Urge incontinence of urine     Past Surgical History  Procedure Date  . Tumor removed   . Abdominal hysterectomy     Family History  Problem Relation Age of Onset  . Heart attack Father   . Early death Father 58  . Stroke Father   . Heart disease Father   . Coronary artery disease Brother   . Heart disease  Brother     MI @ 51  . Heart disease Mother   . Heart disease Sister     MI @ 57  . Heart disease Brother     MI in 49s    History  Substance Use Topics  . Smoking status: Current Everyday Smoker -- 0.5 packs/day for 40 years    Types: Cigarettes  . Smokeless tobacco: Not on file  . Alcohol Use: Yes     socially    OB History    Grav Para Term Preterm Abortions TAB SAB Ect Mult Living   3 2   1  1          Review of Systems  HENT: Positive for rhinorrhea.   Respiratory: Positive for shortness of breath.   Cardiovascular: Positive for chest pain.  All other systems reviewed and are negative.    Allergies  Aspirin and Shellfish allergy  Home Medications   Current Outpatient Rx  Name Route Sig Dispense Refill  . ACETAMINOPHEN 325 MG PO TABS Oral Take 650 mg by mouth every 6 (six) hours as needed. For pain    . ALBUTEROL SULFATE HFA 108 (90 BASE) MCG/ACT IN AERS Inhalation Inhale 1-2 puffs into the lungs every 6 (six) hours as needed for wheezing. 1 Inhaler 0  . BUDESONIDE-FORMOTEROL FUMARATE 160-4.5 MCG/ACT IN AERO Inhalation Inhale 2 puffs into the lungs 2 (two) times daily. 1 Inhaler 3  . CARVEDILOL 12.5 MG  PO TABS Oral Take 1 tablet (12.5 mg total) by mouth 2 (two) times daily with a meal. 180 tablet 3  . LISINOPRIL 40 MG PO TABS Oral Take 40 mg by mouth daily.      Marland Kitchen NICOTINE 10 MG IN INHA  60 - 80 puffs per cartridge  6-8 cartridges per day 168 each 2  . NITROGLYCERIN 0.4 MG SL SUBL Sublingual Place 0.4 mg under the tongue every 5 (five) minutes as needed. For chest pain    . OXYBUTYNIN 3 (28) % (MG/ACT) TD GEL Transdermal Place onto the skin daily.      Marland Kitchen PRAVASTATIN SODIUM 20 MG PO TABS Oral Take 20 mg by mouth at bedtime.     Marland Kitchen PREDNISONE 20 MG PO TABS  Take 3 tabs daily for 3 days then 2 tabs daily for 3 days then 1 tab daily for 3 days then stop      BP 150/68  Pulse 72  Temp(Src) 100 F (37.8 C) (Oral)  Resp 20  Ht 5\' 4"  (1.626 m)  Wt 89 lb (40.37 kg)   BMI 15.28 kg/m2  SpO2 96%  Physical Exam  Nursing note and vitals reviewed. Constitutional: She is oriented to person, place, and time. She appears well-developed and well-nourished.  HENT:  Head: Normocephalic and atraumatic.  Right Ear: External ear normal.  Left Ear: External ear normal.  Nose: Nose normal.  Mouth/Throat: Oropharynx is clear and moist.  Eyes: Conjunctivae are normal. Pupils are equal, round, and reactive to light.  Neck: Normal range of motion. Neck supple.  Cardiovascular: Regular rhythm.   Pulmonary/Chest: Effort normal.  Abdominal: Soft.  Musculoskeletal: Normal range of motion.  Neurological: She is alert and oriented to person, place, and time.  Skin: Skin is warm.  Psychiatric: She has a normal mood and affect.    ED Course  Procedures (including critical care time)  Labs Reviewed  CBC - Abnormal; Notable for the following:    WBC 15.5 (*)    All other components within normal limits  BASIC METABOLIC PANEL - Abnormal; Notable for the following:    Potassium 3.2 (*)    CO2 35 (*)    GFR calc non Af Amer 90 (*)    All other components within normal limits  CARDIAC PANEL(CRET KIN+CKTOT+MB+TROPI)  CBC  DIFFERENTIAL   Dg Chest 2 View  02/11/2011  *RADIOLOGY REPORT*  Clinical Data: Shortness of breath, history bronchitis, smoking, cough, coronary disease, COPD, cardiomyopathy, hypertension  CHEST - 2 VIEW  Comparison: 02/07/2011  Findings: Borderline enlargement of cardiac silhouette. Mediastinal contours and pulmonary vascularity normal. Atherosclerotic calcification aortic arch. Emphysematous changes. No pulmonary infiltrate, pleural effusion or pneumothorax. Minimal broad-based levoconvex cervicothoracic scoliosis.  IMPRESSION: Emphysematous changes. Borderline enlargement of cardiac silhouette. No acute abnormalities.  Original Report Authenticated By: Lollie Marrow, M.D.     No diagnosis found.    MDM  I did 2 cardiac markers which are  negative   Date: 02/11/2011  Rate: 75*  Rhythm: normal sinus rhythm  QRS Axis: normal  Intervals: normal  ST/T Wave abnormalities: normal  Conduction Disutrbances:none  Narrative Interpretation:   Old EKG Reviewed: none available and unchanged         Langston Masker, [place] 02/11/11 1757

## 2011-02-11 NOTE — ED Notes (Signed)
Pt sts "I'm feeling better now then when I came in here and I just want to go home."

## 2011-02-12 ENCOUNTER — Telehealth: Payer: Self-pay | Admitting: Internal Medicine

## 2011-02-12 NOTE — Telephone Encounter (Signed)
Spoke with Tonya Harper.  She states she is feeling much better today.  She states that her heart was ruled out yesterday in ED, was treated for bacterial infection- given IV antibiotics, breathing treatments, left with rx for prednisone dose pack.  She has appt scheduled for Thursday for f/u with DDS.  Will keep appt on Thursday and call if any changes in her condition

## 2011-02-12 NOTE — Telephone Encounter (Signed)
Tonya Harper called in today 02/12/11.  She said that you told her to call you today after her Emergency Room visit yesterday.  She said that you wanted to check on her to make sure that she was doing ok.  Does she need to make follow-up appointment today or did you just want her to call you?

## 2011-02-13 NOTE — ED Provider Notes (Signed)
Medical screening examination/treatment/procedure(s) were performed by non-physician practitioner and as supervising physician I was immediately available for consultation/collaboration.  Meagan Hunt, MD 02/13/11 0942 

## 2011-02-14 ENCOUNTER — Encounter: Payer: Self-pay | Admitting: Internal Medicine

## 2011-02-14 ENCOUNTER — Ambulatory Visit (INDEPENDENT_AMBULATORY_CARE_PROVIDER_SITE_OTHER): Payer: PRIVATE HEALTH INSURANCE | Admitting: Internal Medicine

## 2011-02-14 DIAGNOSIS — I5022 Chronic systolic (congestive) heart failure: Secondary | ICD-10-CM

## 2011-02-14 DIAGNOSIS — K623 Rectal prolapse: Secondary | ICD-10-CM

## 2011-02-14 DIAGNOSIS — F17200 Nicotine dependence, unspecified, uncomplicated: Secondary | ICD-10-CM

## 2011-02-14 DIAGNOSIS — J449 Chronic obstructive pulmonary disease, unspecified: Secondary | ICD-10-CM

## 2011-02-14 DIAGNOSIS — I428 Other cardiomyopathies: Secondary | ICD-10-CM

## 2011-02-14 DIAGNOSIS — I2589 Other forms of chronic ischemic heart disease: Secondary | ICD-10-CM

## 2011-02-14 NOTE — Patient Instructions (Signed)
Use colace capsules two daily for 1 week then 1 daily  Will make referral to [facility] surgeon Dr. Abbey Chatters  Keep CPE appt with me

## 2011-02-14 NOTE — Progress Notes (Signed)
Subjective:    Patient ID: Tonya Harper, female    DOB: 04-Sep-1945, 66 y.o.   MRN: [id number]  HPI Tonya Harper is here for follow up.  She had another exacerbation since my last visit and was treated in ER. She tells me she was exposed to someone with the flu and that's when she became ill. She is now on prednisone taper and feeling much better.    She is using Symbicort, missed her first Quit Smart class but is telling me she is taking her last cigarette today.   PFT's pending  She is also concerned about hemmorrhoids.  Intermittant bleeding and feels a mass at her rectum.  Last colonoscopy she reports 1996 and she reports having polyps at that time.  Has not had a colonoscopy since  "I really don't want one"  Allergies  Allergen Reactions  . Aspirin     nausea and dizziness after taking for one month at a time.. States her physician told her to use the 81 mg vs the 325mg .  . Shellfish Allergy     Medication withdrawal symptoms   Past Medical History  Diagnosis Date  . Pure hypercholesterolemia   . CAD   . Chronic systolic heart failure   . COPD   . Ischemic cardiomyopathy   . Anemia   . Hypertension   . Urge incontinence of urine    Past Surgical History  Procedure Date  . Tumor removed   . Abdominal hysterectomy    History   Social History  . Marital Status: Single    Spouse Name: N/A    Number of Children: N/A  . Years of Education: N/A   Occupational History  . unemployed    Social History Main Topics  . Smoking status: Current Everyday Smoker -- 0.5 packs/day for 40 years    Types: Cigarettes  . Smokeless tobacco: Not on file  . Alcohol Use: Yes     socially  . Drug Use: No  . Sexually Active: Yes    Birth Control/ Protection: Surgical   Other Topics Concern  . Not on file   Social History Narrative  . No narrative on file   Family History  Problem Relation Age of Onset  . Heart attack Father   . Early death Father 1  . Stroke Father   . Heart  disease Father   . Coronary artery disease Brother   . Heart disease Brother     MI @ 77  . Heart disease Mother   . Heart disease Sister     MI @ 45  . Heart disease Brother     MI in 72s   Patient Active Problem List  Diagnoses  . PURE HYPERCHOLESTEROLEMIA  . HYPOKALEMIA  . OTHER SPECIFIED ANEMIAS  . TOBACCO ABUSE  . CAD  . CARDIOMYOPATHY  . CHRONIC SYSTOLIC HEART FAILURE  . COPD  . BRONCHITIS  . OTHER SPEC FORMS CHRONIC ISCHEMIC HEART DISEASE  . Bruit   Current Outpatient Prescriptions on File Prior to Visit  Medication Sig Dispense Refill  . acetaminophen (TYLENOL) 325 MG tablet Take 650 mg by mouth every 6 (six) hours as needed. For pain      . albuterol (PROVENTIL HFA;VENTOLIN HFA) 108 (90 BASE) MCG/ACT inhaler Inhale 1-2 puffs into the lungs every 6 (six) hours as needed for wheezing.  1 Inhaler  0  . budesonide-formoterol (SYMBICORT) 160-4.5 MCG/ACT inhaler Inhale 2 puffs into the lungs 2 (two) times daily.  1 Inhaler  3  .  carvedilol (COREG) 12.5 MG tablet Take 1 tablet (12.5 mg total) by mouth 2 (two) times daily with a meal.  180 tablet  3  . lisinopril (PRINIVIL,ZESTRIL) 40 MG tablet Take 40 mg by mouth daily.        . nicotine (NICOTROL) 10 MG inhaler 60 - 80 puffs per cartridge  6-8 cartridges per day  168 each  2  . nitroGLYCERIN (NITROSTAT) 0.4 MG SL tablet Place 0.4 mg under the tongue every 5 (five) minutes as needed. For chest pain      . Oxybutynin (GELNIQUE) 3 (28) % (MG/ACT) GEL Place onto the skin daily.        . pravastatin (PRAVACHOL) 20 MG tablet Take 20 mg by mouth at bedtime.       . predniSONE (DELTASONE) 10 MG tablet 6,5,4,3,2,1 taper  21 tablet  0       Review of Systems See HPI    Objective:   Physical Exam Physical Exam  Nursing note and vitals reviewed.  Constitutional: She is oriented to person, place, and time. Thin asthenic.  HENT:  Head: Normocephalic and atraumatic.  Cardiovascular: Normal rate and regular rhythm. Exam reveals  no gallop and no friction rub.  No murmur heard.  Pulmonary/Chest: Breath sounds normal. She has no wheezes. She has no rales.  ABD  Soft nt nd no HSM  Rectal evidence of rectal prolapse.  Stool brown  Guaiac neg. Neurological: She is alert and oriented to person, place, and time.  Skin: Skin is warm and dry.  Psychiatric: She has a normal mood and affect. Her behavior is normal.       Assessment & Plan:  1)  Advance COPD, recent exacerbation.   She has had influenza vaccine.  On Prednisone now.  Continue Symbicort, albuterol rescue.  Will have to moniter clinical course over ensuing months when she is off Prednisone.  May soon be steroid dependent.  Pfts pending 2)  Tobacco use  Strongly enocuraged Quit smart classes.  She missed first one as she was in ER  3)  Advanced cardiomyopathy 4)  Rectal prolapse.  She needs colonoscopy but is refusing now ,  Will get Gi surgeon opinion.  Colace daily to prevent straining

## 2011-02-19 ENCOUNTER — Telehealth: Payer: Self-pay | Admitting: *Deleted

## 2011-02-19 NOTE — Telephone Encounter (Signed)
[email or web address] Castillo, CMA  

## 2011-02-19 NOTE — Telephone Encounter (Signed)
Message copied by Darrell Jewel on Tue Feb 19, 2011  3:44 PM ------      Message from: Shan Levans E      Created: Thu Feb 14, 2011  4:23 PM       This pt needs ov soon      Recent ED visit

## 2011-02-19 NOTE — Telephone Encounter (Signed)
[facility]U appt. Made for 1/28 @ 11:45 a.m. In the [place] office. [facility]

## 2011-02-20 ENCOUNTER — Ambulatory Visit (INDEPENDENT_AMBULATORY_CARE_PROVIDER_SITE_OTHER): Payer: Commercial Indemnity | Admitting: Critical Care Medicine

## 2011-02-20 DIAGNOSIS — J449 Chronic obstructive pulmonary disease, unspecified: Secondary | ICD-10-CM

## 2011-02-20 LAB — PULMONARY FUNCTION TEST

## 2011-02-20 NOTE — Progress Notes (Signed)
PFT done today. 

## 2011-02-21 ENCOUNTER — Telehealth: Payer: Self-pay | Admitting: Critical Care Medicine

## 2011-02-21 DIAGNOSIS — J449 Chronic obstructive pulmonary disease, unspecified: Secondary | ICD-10-CM

## 2011-02-21 NOTE — Telephone Encounter (Signed)
PFTs reviewed with the pt. A1AT normal and pt aware Pt states no smoking since 02/14/11

## 2011-02-25 ENCOUNTER — Encounter: Payer: Self-pay | Admitting: Critical Care Medicine

## 2011-02-25 ENCOUNTER — Ambulatory Visit (INDEPENDENT_AMBULATORY_CARE_PROVIDER_SITE_OTHER): Payer: Commercial Indemnity | Admitting: Critical Care Medicine

## 2011-02-25 VITALS — BP 150/84 | HR 82 | Temp 98.2°F | Ht 64.0 in | Wt 85.0 lb

## 2011-02-25 DIAGNOSIS — J449 Chronic obstructive pulmonary disease, unspecified: Secondary | ICD-10-CM

## 2011-02-25 MED ORDER — ALBUTEROL SULFATE HFA 108 (90 BASE) MCG/ACT IN AERS: 1.0000 | INHALATION_SPRAY | Freq: Four times a day (QID) | RESPIRATORY_TRACT | Status: DC | PRN

## 2011-02-25 NOTE — Assessment & Plan Note (Signed)
A1AT levels normal 1/13 PFT 1/13: FeV1 38%  FVC 72%  DLCO 41% TLC 62%  : combined severe restriction/obstruction Moderate Copd with recent exacerbation now improved off smoking Plan Cont symbicort two puff bid Prn SABA No further prednisone

## 2011-02-25 NOTE — Patient Instructions (Signed)
No change in medications Return 4 months 

## 2011-02-25 NOTE — Progress Notes (Signed)
Subjective:    Patient ID: Tonya Harper, female    DOB: 07-Oct-1945, 66 y.o.   MRN: [id number]  HPI Comments: Hx of dyspnea.  Dx bronchitis in the past, will flare twice a year.  Current flare since 10/12.  Now is a bit better, not back to 100%  66 y.o. WF   02/25/2011 Last seen 02/07/11  A1AT assay was normal PFTs done: Moderate obstruction with DLCO 41%  Since last ov 1/10 pt went to ED 1/14.  Rx pred taper.  CXR neg for pna.  Since that visit is better.  Is off cigarettes .   Cough is better.  Not as dyspneic as before  Pt denies any significant sore throat, nasal congestion or excess secretions, fever, chills, sweats, unintended weight loss, pleurtic or exertional chest pain, orthopnea PND, or leg swelling Pt denies any increase in rescue therapy over baseline, denies waking up needing it or having any early am or nocturnal exacerbations of coughing/wheezing/or dyspnea. Pt also denies any obvious fluctuation in symptoms with  weather or environmental change or other alleviating or aggravating factors   Past Medical History  Diagnosis Date  . Pure hypercholesterolemia   . CAD   . Chronic systolic heart failure   . COPD   . Ischemic cardiomyopathy   . Anemia   . Hypertension   . Urge incontinence of urine      Family History  Problem Relation Age of Onset  . Heart attack Father   . Early death Father 79  . Stroke Father   . Heart disease Father   . Coronary artery disease Brother   . Heart disease Brother     MI @ 16  . Heart disease Mother   . Heart disease Sister     MI @ 35  . Heart disease Brother     MI in 90s     History   Social History  . Marital Status: Single    Spouse Name: N/A    Number of Children: N/A  . Years of Education: N/A   Occupational History  . unemployed    Social History Main Topics  . Smoking status: Former Smoker -- 0.5 packs/day for 40 years    Types: Cigarettes    Quit date: 02/14/2011  . Smokeless tobacco: Not on file  .  Alcohol Use: Yes     socially  . Drug Use: No  . Sexually Active: Yes    Birth Control/ Protection: Surgical   Other Topics Concern  . Not on file   Social History Narrative  . No narrative on file     Allergies  Allergen Reactions  . Aspirin     nausea and dizziness after taking for one month at a time.. States her physician told her to use the 81 mg vs the 325mg .  . Shellfish Allergy     Medication withdrawal symptoms     Outpatient Prescriptions Prior to Visit  Medication Sig Dispense Refill  . acetaminophen (TYLENOL) 325 MG tablet Take 650 mg by mouth every 6 (six) hours as needed. For pain      . budesonide-formoterol (SYMBICORT) 160-4.5 MCG/ACT inhaler Inhale 2 puffs into the lungs 2 (two) times daily.  1 Inhaler  3  . carvedilol (COREG) 12.5 MG tablet Take 1 tablet (12.5 mg total) by mouth 2 (two) times daily with a meal.  180 tablet  3  . lisinopril (PRINIVIL,ZESTRIL) 40 MG tablet Take 40 mg by mouth daily.        Marland Kitchen  nitroGLYCERIN (NITROSTAT) 0.4 MG SL tablet Place 0.4 mg under the tongue every 5 (five) minutes as needed. For chest pain      . Oxybutynin (GELNIQUE) 3 (28) % (MG/ACT) GEL Place onto the skin daily.        . pravastatin (PRAVACHOL) 20 MG tablet Take 20 mg by mouth at bedtime.       Marland Kitchen albuterol (PROVENTIL HFA;VENTOLIN HFA) 108 (90 BASE) MCG/ACT inhaler Inhale 1-2 puffs into the lungs every 6 (six) hours as needed for wheezing.  1 Inhaler  0  . nicotine (NICOTROL) 10 MG inhaler 60 - 80 puffs per cartridge  6-8 cartridges per day  168 each  2  . predniSONE (DELTASONE) 10 MG tablet 6,5,4,3,2,1 taper  21 tablet  0     Review of Systems  Constitutional: Negative for chills, diaphoresis, activity change, appetite change, fatigue and unexpected weight change.  HENT: Positive for trouble swallowing. Negative for hearing loss, nosebleeds, congestion, facial swelling, sneezing, mouth sores, neck stiffness, dental problem, voice change, postnasal drip, sinus pressure,  tinnitus and ear discharge.   Eyes: Negative for photophobia, discharge, redness, itching and visual disturbance.  Respiratory: Positive for shortness of breath. Negative for apnea, cough, choking, chest tightness and stridor.   Cardiovascular: Negative for palpitations.  Gastrointestinal: Negative for nausea, constipation, blood in stool and abdominal distention.  Genitourinary: Negative for dysuria, urgency, frequency, hematuria, flank pain, decreased urine volume and difficulty urinating.  Musculoskeletal: Negative for myalgias, back pain, joint swelling, arthralgias and gait problem.  Skin: Negative for color change and pallor.  Neurological: Negative for dizziness, tremors, seizures, syncope, speech difficulty, weakness, light-headedness and numbness.  Hematological: Negative for adenopathy. Does not bruise/bleed easily.  Psychiatric/Behavioral: Negative for confusion, sleep disturbance, dysphoric mood and agitation. The patient is not nervous/anxious.        Objective:   Physical Exam  Filed Vitals:   02/25/11 1150  BP: 150/84  Pulse: 82  Temp: 98.2 F (36.8 C)  TempSrc: Oral  Height: 5\' 4"  (1.626 m)  Weight: 85 lb (38.556 kg)  SpO2: 96%    Gen: Pleasant, well-nourished, in no distress,  normal affect  ENT: No lesions,  mouth clear,  oropharynx clear, no postnasal drip  Neck: No JVD, no TMG, no carotid bruits  Lungs: No use of accessory muscles, no dullness to percussion,distant BS without rales or rhonchi  Cardiovascular: RRR, heart sounds normal, no murmur or gallops, no peripheral edema  Abdomen: soft and NT, no HSM,  BS normal  Musculoskeletal: No deformities, no cyanosis or clubbing  Neuro: alert, non focal  Skin: Warm, no lesions or rashes      CXR 10/12 Findings: The cardiac shadow is stable. No pulmonary vascular  congestion is seen. The lungs are hyperinflated consistent with  COPD. Postsurgical changes are noted in the mediastinum. No acute  bony  abnormality is seen.  IMPRESSION: COPD without acute abnormality.      Assessment & Plan:   COPD A1AT levels normal 1/13 PFT 1/13: FeV1 38%  FVC 72%  DLCO 41% TLC 62%  : combined severe restriction/obstruction Moderate Copd with recent exacerbation now improved off smoking Plan Cont symbicort two puff bid Prn SABA No further prednisone      Updated Medication List Outpatient Encounter Prescriptions as of 02/25/2011  Medication Sig Dispense Refill  . acetaminophen (TYLENOL) 325 MG tablet Take 650 mg by mouth every 6 (six) hours as needed. For pain      . albuterol (PROVENTIL HFA;VENTOLIN HFA) 108 (  90 BASE) MCG/ACT inhaler Inhale 1-2 puffs into the lungs every 6 (six) hours as needed for wheezing.  1 Inhaler  6  . budesonide-formoterol (SYMBICORT) 160-4.5 MCG/ACT inhaler Inhale 2 puffs into the lungs 2 (two) times daily.  1 Inhaler  3  . carvedilol (COREG) 12.5 MG tablet Take 1 tablet (12.5 mg total) by mouth 2 (two) times daily with a meal.  180 tablet  3  . lisinopril (PRINIVIL,ZESTRIL) 40 MG tablet Take 40 mg by mouth daily.        . nitroGLYCERIN (NITROSTAT) 0.4 MG SL tablet Place 0.4 mg under the tongue every 5 (five) minutes as needed. For chest pain      . Oxybutynin (GELNIQUE) 3 (28) % (MG/ACT) GEL Place onto the skin daily.        . pravastatin (PRAVACHOL) 20 MG tablet Take 20 mg by mouth at bedtime.       Marland Kitchen DISCONTD: albuterol (PROVENTIL HFA;VENTOLIN HFA) 108 (90 BASE) MCG/ACT inhaler Inhale 1-2 puffs into the lungs every 6 (six) hours as needed for wheezing.  1 Inhaler  0  . DISCONTD: albuterol (PROVENTIL HFA;VENTOLIN HFA) 108 (90 BASE) MCG/ACT inhaler Inhale 1-2 puffs into the lungs every 6 (six) hours as needed for wheezing.  1 Inhaler  6  . DISCONTD: nicotine (NICOTROL) 10 MG inhaler 60 - 80 puffs per cartridge  6-8 cartridges per day  168 each  2  . DISCONTD: predniSONE (DELTASONE) 10 MG tablet 6,5,4,3,2,1 taper  21 tablet  0

## 2011-02-27 ENCOUNTER — Encounter (INDEPENDENT_AMBULATORY_CARE_PROVIDER_SITE_OTHER): Payer: Self-pay | Admitting: Surgery

## 2011-02-27 ENCOUNTER — Ambulatory Visit (INDEPENDENT_AMBULATORY_CARE_PROVIDER_SITE_OTHER): Payer: Commercial Indemnity | Admitting: Surgery

## 2011-02-27 VITALS — BP 128/64 | HR 66 | Temp 97.3°F | Resp 16 | Ht 64.0 in | Wt 83.0 lb

## 2011-02-27 DIAGNOSIS — Z860100 Personal history of colon polyps, unspecified: Secondary | ICD-10-CM

## 2011-02-27 DIAGNOSIS — K648 Other hemorrhoids: Secondary | ICD-10-CM

## 2011-02-27 HISTORY — DX: Other hemorrhoids: K64.8

## 2011-02-27 HISTORY — DX: Personal history of colon polyps, unspecified: Z86.0100

## 2011-02-27 HISTORY — DX: Personal history of colonic polyps: Z86.010

## 2011-02-27 MED ORDER — HYDROCORTISONE ACE-PRAMOXINE 2.5-1 % RE CREA: TOPICAL_CREAM | Freq: Four times a day (QID) | RECTAL | Status: AC

## 2011-02-27 NOTE — Patient Instructions (Signed)

## 2011-02-27 NOTE — Progress Notes (Signed)
Subjective:     Patient ID: Tonya Harper, female   DOB: 04-24-1945, 66 y.o.   MRN: [id number]  HPI  Tonya Harper  Apr 14, 1945 [id number]  Patient Care Team: Levon Hedger, MD as PCP - General (Internal Medicine) Shan Levans, MD as Attending Physician (Pulmonary Disease)  This patient is a 66 y.o.female who presents today for surgical evaluation at the request of Dr. Constance Goltz.   Reason for visit: Prolapsing out the rectum.  The patient is a thin cachectic female who has struggled with bleeding and prolapse for about a year. It used to intermittently bulge but now it happens all the time. Prior history of hemorrhoid problems her anal rectal bleeding. She will get some bleeding with wiping. It is uncomfortable.  She has a history of colon polyps based on a colonoscopy done in 1996. She thinks there were 6 benign polyps. She's never had a followup colonoscopy. She says she's been meaning to do that. Her weight usually was around 105 but after an accident 10 years ago she is not been able to keep weight. No major changes recently. No personal or family history of rectal cancer that she can recall. Normally has a bowel movement about every day. Occasionally firm. Occasionally loose after recent bouts of PO  antibiotic Tx for bronchitis.  Because of prolapsing mucosa, her primary care physician sent her to [place] to see if something needed to be done.  Patient Active Problem List  Diagnoses  . PURE HYPERCHOLESTEROLEMIA  . HYPOKALEMIA  . OTHER SPECIFIED ANEMIAS  . TOBACCO ABUSE  . CAD  . CARDIOMYOPATHY  . CHRONIC SYSTOLIC HEART FAILURE  . COPD  . OTHER SPEC FORMS CHRONIC ISCHEMIC HEART DISEASE  . Bruit  . Hemorrhoids, Right posterior, internal, with prolapse & bleeding  . Personal history of colonic polyps, last colonoscopy 1996 (x6 then)    Past Medical History  Diagnosis Date  . CAD   . Chronic systolic heart failure   . COPD   . Ischemic cardiomyopathy   . Anemia   .  Hypertension   . Urge incontinence of urine   . CHF (congestive heart failure)   . Hearing loss   . Foot swelling   . Rectal bleeding   . Personal history of colonic polyps 02/27/2011  . Hemorrhoids, Right posterior, internal, with prolapse & bleeding 02/27/2011    Past Surgical History  Procedure Date  . Tumor removed   . Abdominal hysterectomy 1976    History   Social History  . Marital Status: Married    Spouse Name: N/A    Number of Children: N/A  . Years of Education: N/A   Occupational History  . unemployed    Social History Main Topics  . Smoking status: Former Smoker -- 0.5 packs/day for 40 years    Types: Cigarettes    Quit date: 02/14/2011  . Smokeless tobacco: Never Used  . Alcohol Use: Yes     socially once or twice per year  . Drug Use: No  . Sexually Active: Yes    Birth Control/ Protection: Surgical   Other Topics Concern  . Not on file   Social History Narrative  . No narrative on file    Family History  Problem Relation Age of Onset  . Heart attack Father   . Early death Father 51  . Stroke Father   . Heart disease Father   . Coronary artery disease Brother   . Heart disease Brother     MI @  45  . Kidney disease Mother   . Heart disease Sister     MI @ 53  . Heart disease Brother     MI in 73s    Current outpatient prescriptions:acetaminophen (TYLENOL) 325 MG tablet, Take 650 mg by mouth every 6 (six) hours as needed. For pain, Disp: , Rfl: ;  albuterol (PROVENTIL HFA;VENTOLIN HFA) 108 (90 BASE) MCG/ACT inhaler, Inhale 1-2 puffs into the lungs every 6 (six) hours as needed for wheezing., Disp: 1 Inhaler, Rfl: 6 budesonide-formoterol (SYMBICORT) 160-4.5 MCG/ACT inhaler, Inhale 2 puffs into the lungs 2 (two) times daily., Disp: 1 Inhaler, Rfl: 3;  carvedilol (COREG) 12.5 MG tablet, Take 1 tablet (12.5 mg total) by mouth 2 (two) times daily with a meal., Disp: 180 tablet, Rfl: 3;  hydrocortisone-pramoxine (ANALPRAM-HC) 2.5-1 % rectal cream,  Place rectally 4 (four) times daily. For irritated and painful hemorrhoids, Disp: 15 g, Rfl: 2 lisinopril (PRINIVIL,ZESTRIL) 40 MG tablet, Take 40 mg by mouth daily.  , Disp: , Rfl: ;  nitroGLYCERIN (NITROSTAT) 0.4 MG SL tablet, Place 0.4 mg under the tongue every 5 (five) minutes as needed. For chest pain, Disp: , Rfl: ;  Oxybutynin (GELNIQUE) 3 (28) % (MG/ACT) GEL, Place onto the skin daily.  , Disp: , Rfl: ;  pravastatin (PRAVACHOL) 20 MG tablet, Take 20 mg by mouth at bedtime. , Disp: , Rfl:   Allergies  Allergen Reactions  . Aspirin     nausea and dizziness after taking for one month at a time.. States her physician told her to use the 81 mg vs the 325mg .  . Shellfish Allergy     Medication withdrawal symptoms    BP 128/64  Pulse 66  Temp(Src) 97.3 F (36.3 C) (Temporal)  Resp 16  Ht 5\' 4"  (1.626 m)  Wt 83 lb (37.649 kg)  BMI 14.25 kg/m2     Review of Systems  Constitutional: Negative for fever, chills, diaphoresis and fatigue.  HENT: Negative for ear pain, sore throat, trouble swallowing, neck pain and ear discharge.   Eyes: Negative for photophobia, discharge and visual disturbance.  Respiratory: Negative for cough, choking, chest tightness and shortness of breath.   Cardiovascular: Negative for chest pain and palpitations.       Patient walks 20 minutes for about 1/4 miles without difficulty.  No exertional chest/neck/shoulder/arm pain.  Gastrointestinal: Positive for anal bleeding. Negative for nausea, vomiting, abdominal pain, diarrhea, constipation, blood in stool, abdominal distention and rectal pain.  Genitourinary: Negative for dysuria, frequency and difficulty urinating.  Musculoskeletal: Negative for myalgias and gait problem.  Skin: Negative for color change, pallor and rash.  Neurological: Negative for dizziness, speech difficulty, weakness and numbness.  Hematological: Negative for adenopathy.  Psychiatric/Behavioral: Negative for confusion and agitation. The  patient is not nervous/anxious.        Objective:   Physical Exam  Constitutional: She is oriented to person, place, and time. She appears well-developed. She appears cachectic. She is active and cooperative.  Non-toxic appearance. She does not have a sickly appearance. She does not appear ill. No distress.  HENT:  Head: Normocephalic.  Mouth/Throat: Oropharynx is clear and moist. No oropharyngeal exudate.  Eyes: Conjunctivae and EOM are normal. Pupils are equal, round, and reactive to light. No scleral icterus.  Neck: Normal range of motion. Neck supple. No tracheal deviation present.  Cardiovascular: Normal rate, regular rhythm and intact distal pulses.   Pulmonary/Chest: Effort normal and breath sounds normal. No respiratory distress. She exhibits no tenderness.  Abdominal:  Soft. She exhibits no distension and no mass. There is no tenderness. Hernia confirmed negative in the right inguinal area and confirmed negative in the left inguinal area.  Genitourinary: No vaginal discharge found.       Perianal skin clean with good hygiene.  No pruritis.  No pilonidal disease.  No fissure.  No abscess/fistula.    Tolerates digital and anoscopic rectal exam.  Normal sphincter tone.  No rectal masses.    Prolapsing R post hemorrhoid of mod size.  Rest of hemorrhoidal piles WNL   Musculoskeletal: Normal range of motion. She exhibits no tenderness.  Lymphadenopathy:    She has no cervical adenopathy.       Right: No inguinal adenopathy present.       Left: No inguinal adenopathy present.  Neurological: She is alert and oriented to person, place, and time. No cranial nerve deficit. She exhibits normal muscle tone. Coordination normal.  Skin: Skin is warm and dry. No rash noted. She is not diaphoretic. No erythema.  Psychiatric: She has a normal mood and affect. Her behavior is normal. Judgment and thought content normal.       Assessment:     Hemorrhoid internal prolapsed.    No evidence of  full rectal prolapse/procidentia  H/o colon polyps, need for colonoscopy    Plan:     The anatomy & physiology of the anorectal region was discussed.  The pathophysiology of hemorrhoids and differential diagnosis was discussed.  Natural history progression  was discussed.   I stressed the importance of a bowel regimen to have daily soft bowel movements to minimize progression of disease.     The patient's symptoms are not adequately controlled.  Therefore, I recommended banding to treat the hemorrhoids.  I went over the technique, risks, benefits, and alternatives.   Goals of post-operative recovery were discussed as well.  Questions were answered.  The patient expressed understanding & wished to proceed.  The patient was positioned in the lateral decubitus position.  Perianal & rectal examination was done.  Using anoscopy, I ligated the R post hemorrhoid above the dentate line with banding x 2.  The patient tolerated the procedure well.  Educational handouts further explaining the pathology, treatment options, and bowel regimen were given as well.   May need repeat bending.  I tried to convince the patient to schedule a colonoscopy. She kept saying she "wanted to wait". I noted the scheduling now would allow the hemorrhoid issue to resolve and scheduled a few months. She is at high risk for having polyps given the 6 polyps before. I do not think I could convince her.  I will retry on followup.

## 2011-02-28 ENCOUNTER — Encounter: Payer: Self-pay | Admitting: Critical Care Medicine

## 2011-03-04 ENCOUNTER — Encounter: Payer: Self-pay | Admitting: Critical Care Medicine

## 2011-03-04 NOTE — Progress Notes (Signed)
Addended by: Chip Boer on: 03/04/2011 04:55 PM   Modules accepted: Orders

## 2011-03-20 ENCOUNTER — Encounter (INDEPENDENT_AMBULATORY_CARE_PROVIDER_SITE_OTHER): Payer: Commercial Indemnity | Admitting: Surgery

## 2011-03-21 ENCOUNTER — Encounter: Payer: Self-pay | Admitting: Internal Medicine

## 2011-03-21 ENCOUNTER — Ambulatory Visit (INDEPENDENT_AMBULATORY_CARE_PROVIDER_SITE_OTHER): Payer: 59 | Admitting: Internal Medicine

## 2011-03-21 VITALS — BP 156/79 | HR 89 | Temp 99.8°F | Resp 24 | Ht 64.0 in | Wt 85.0 lb

## 2011-03-21 DIAGNOSIS — J40 Bronchitis, not specified as acute or chronic: Secondary | ICD-10-CM

## 2011-03-21 DIAGNOSIS — J441 Chronic obstructive pulmonary disease with (acute) exacerbation: Secondary | ICD-10-CM

## 2011-03-21 DIAGNOSIS — I10 Essential (primary) hypertension: Secondary | ICD-10-CM

## 2011-03-21 MED ORDER — CEFTRIAXONE SODIUM 1 G IJ SOLR
500.0000 mg | Freq: Once | INTRAMUSCULAR | Status: AC
  Administered 2011-03-21: 500 mg via INTRAMUSCULAR

## 2011-03-21 MED ORDER — ALBUTEROL SULFATE (2.5 MG/3ML) 0.083% IN NEBU
2.5000 mg | INHALATION_SOLUTION | Freq: Once | RESPIRATORY_TRACT | Status: AC
  Administered 2011-03-21: 2.5 mg via RESPIRATORY_TRACT

## 2011-03-21 MED ORDER — PREDNISONE 20 MG PO TABS: ORAL_TABLET | ORAL | Status: DC

## 2011-03-21 MED ORDER — METHYLPREDNISOLONE ACETATE 80 MG/ML IJ SUSP: 120.0000 mg | Freq: Once | INTRAMUSCULAR | Status: DC

## 2011-03-21 MED ORDER — CLONIDINE HCL 0.1 MG PO TABS: ORAL_TABLET | ORAL | Status: DC

## 2011-03-21 MED ORDER — METHYLPREDNISOLONE ACETATE PF 80 MG/ML IJ SUSP
120.0000 mg | Freq: Once | INTRAMUSCULAR | Status: AC
  Administered 2011-03-21: 120 mg via INTRAMUSCULAR

## 2011-03-21 NOTE — Progress Notes (Signed)
Subjective:    Patient ID: Tonya Harper, female    DOB: Oct 30, 1945, 66 y.o.   MRN: [id number]  HPI  Tonya Harper is here for acute visit. Began with chest congestion, SOB, chest tightness a few days ago.  Using rescue inhaler 4 times during day and at night.  Now has laryngitis and slight sore throat .  NO doucmented fever, she had an influenza  Vaccine  Allergies  Allergen Reactions  . Aspirin     nausea and dizziness after taking for one month at a time.. States her physician told her to use the 81 mg vs the 325mg .  . Shellfish Allergy     Medication withdrawal symptoms   Past Medical History  Diagnosis Date  . CAD   . Chronic systolic heart failure   . COPD   . Ischemic cardiomyopathy   . Anemia   . Hypertension   . Urge incontinence of urine   . CHF (congestive heart failure)   . Hearing loss   . Foot swelling   . Rectal bleeding   . Personal history of colonic polyps 02/27/2011  . Hemorrhoids, Right posterior, internal, with prolapse & bleeding 02/27/2011   Past Surgical History  Procedure Date  . Tumor removed   . Abdominal hysterectomy 1976   History   Social History  . Marital Status: Married    Spouse Name: N/A    Number of Children: N/A  . Years of Education: N/A   Occupational History  . unemployed    Social History Main Topics  . Smoking status: Former Smoker -- 0.5 packs/day for 40 years    Types: Cigarettes    Quit date: 02/14/2011  . Smokeless tobacco: Never Used  . Alcohol Use: Yes     socially once or twice per year  . Drug Use: No  . Sexually Active: Yes    Birth Control/ Protection: Surgical   Other Topics Concern  . Not on file   Social History Narrative  . No narrative on file   Family History  Problem Relation Age of Onset  . Heart attack Father   . Early death Father 54  . Stroke Father   . Heart disease Father   . Coronary artery disease Brother   . Heart disease Brother     MI @ 32  . Kidney disease Mother   . Heart disease  Sister     MI @ 80  . Heart disease Brother     MI in 1s   Patient Active Problem List  Diagnoses  . PURE HYPERCHOLESTEROLEMIA  . HYPOKALEMIA  . OTHER SPECIFIED ANEMIAS  . TOBACCO ABUSE  . CAD  . CARDIOMYOPATHY  . CHRONIC SYSTOLIC HEART FAILURE  . COPD  . OTHER SPEC FORMS CHRONIC ISCHEMIC HEART DISEASE  . Bruit  . Hemorrhoids, Right posterior, internal, with prolapse & bleeding  . Personal history of colonic polyps, last colonoscopy 1996 (x6 then)   Current Outpatient Prescriptions on File Prior to Visit  Medication Sig Dispense Refill  . acetaminophen (TYLENOL) 325 MG tablet Take 650 mg by mouth every 6 (six) hours as needed. For pain      . albuterol (PROVENTIL HFA;VENTOLIN HFA) 108 (90 BASE) MCG/ACT inhaler Inhale 1-2 puffs into the lungs every 6 (six) hours as needed for wheezing.  1 Inhaler  6  . budesonide-formoterol (SYMBICORT) 160-4.5 MCG/ACT inhaler Inhale 2 puffs into the lungs 2 (two) times daily.  1 Inhaler  3  . carvedilol (COREG) 12.5 MG tablet  Take 1 tablet (12.5 mg total) by mouth 2 (two) times daily with a meal.  180 tablet  3  . lisinopril (PRINIVIL,ZESTRIL) 40 MG tablet Take 40 mg by mouth daily.        . nitroGLYCERIN (NITROSTAT) 0.4 MG SL tablet Place 0.4 mg under the tongue every 5 (five) minutes as needed. For chest pain      . Oxybutynin (GELNIQUE) 3 (28) % (MG/ACT) GEL Place onto the skin daily.        . pravastatin (PRAVACHOL) 20 MG tablet Take 20 mg by mouth at bedtime.        No current facility-administered medications on file prior to visit.      Review of Systems    see HPI Objective:   Physical Exam Physical Exam  Nursing note and vitals reviewed. Alert, speaking if full sentences.  Laryngitis noted.  Pulse ox 95% today Constitutional: She is oriented to person, place, and time. She appears thin, asthenic . She is cooperative.  HENT:  Head: Normocephalic and atraumatic.  Nose: Mucosal edema present.  Eyes: Conjunctivae and EOM are  normal. Pupils are equal, round, and reactive to light.  Neck: Neck supple.  Cardiovascular: Regular rhythm, normal heart sounds, intact distal pulses and normal pulses. Exam reveals no gallop and no friction rub.  No murmur heard.  Pulmonary/Chest: She has no wheezes. She has rhonchi scattered bilaterally. She has no rales.  Neurological: She is alert and oriented to person, place, and time.  Skin: Skin is warm and dry. No abrasion, no bruising, no ecchymosis and no rash noted. No cyanosis. Nails show no clubbing.  Psychiatric: She has a normal mood and affect. Her speech is normal and behavior is normal.         Assessment & Plan:  1)  Copd Exacerbation/ Bronchitis  Will give Albuterol HHN x2 1 hour apart.  Depomedrol 120 mg and Rocephin 500 mg IM today.  Sent home with Prednisone 40 mg taper.  Pt counseled of risk of sudden decompensation with COPD and advanced cardiomyopathy.  Counseled if any worsening she is to get to the ER.  She voices understanding.  She is to follow up with me on Monday.  2)  Elevated BP. On ACE and Coreg.  Likely due to acute illness. Do not want to  increase beta blocker given resp status. Will add Clonidine 0.1 mg once a day for now.  Recheck Monday

## 2011-03-21 NOTE — Patient Instructions (Signed)
Take meds as prescribed  See me in office Monday  If any worsening go to ER

## 2011-03-25 ENCOUNTER — Other Ambulatory Visit: Payer: Self-pay | Admitting: Cardiology

## 2011-03-25 ENCOUNTER — Emergency Department (HOSPITAL_BASED_OUTPATIENT_CLINIC_OR_DEPARTMENT_OTHER)
Admission: EM | Admit: 2011-03-25 | Discharge: 2011-03-25 | Disposition: A | Discharge status: Home / Self Care | Payer: 59 | Attending: Emergency Medicine | Admitting: Emergency Medicine

## 2011-03-25 ENCOUNTER — Ambulatory Visit (INDEPENDENT_AMBULATORY_CARE_PROVIDER_SITE_OTHER): Payer: 59 | Admitting: Internal Medicine

## 2011-03-25 ENCOUNTER — Emergency Department (INDEPENDENT_AMBULATORY_CARE_PROVIDER_SITE_OTHER): Payer: 59

## 2011-03-25 ENCOUNTER — Other Ambulatory Visit: Payer: Self-pay

## 2011-03-25 ENCOUNTER — Encounter: Payer: Self-pay | Admitting: Internal Medicine

## 2011-03-25 ENCOUNTER — Encounter (HOSPITAL_BASED_OUTPATIENT_CLINIC_OR_DEPARTMENT_OTHER): Payer: Self-pay

## 2011-03-25 VITALS — BP 170/82 | HR 69 | Temp 97.5°F | Resp 26 | Ht 64.0 in | Wt 90.0 lb

## 2011-03-25 DIAGNOSIS — I509 Heart failure, unspecified: Secondary | ICD-10-CM | POA: Insufficient documentation

## 2011-03-25 DIAGNOSIS — R070 Pain in throat: Secondary | ICD-10-CM | POA: Insufficient documentation

## 2011-03-25 DIAGNOSIS — R0609 Other forms of dyspnea: Secondary | ICD-10-CM | POA: Insufficient documentation

## 2011-03-25 DIAGNOSIS — I10 Essential (primary) hypertension: Secondary | ICD-10-CM

## 2011-03-25 DIAGNOSIS — I2510 Atherosclerotic heart disease of native coronary artery without angina pectoris: Secondary | ICD-10-CM | POA: Insufficient documentation

## 2011-03-25 DIAGNOSIS — R0682 Tachypnea, not elsewhere classified: Secondary | ICD-10-CM | POA: Insufficient documentation

## 2011-03-25 DIAGNOSIS — R05 Cough: Secondary | ICD-10-CM

## 2011-03-25 DIAGNOSIS — R0602 Shortness of breath: Secondary | ICD-10-CM

## 2011-03-25 DIAGNOSIS — J441 Chronic obstructive pulmonary disease with (acute) exacerbation: Secondary | ICD-10-CM | POA: Insufficient documentation

## 2011-03-25 DIAGNOSIS — R5383 Other fatigue: Secondary | ICD-10-CM | POA: Insufficient documentation

## 2011-03-25 DIAGNOSIS — R0989 Other specified symptoms and signs involving the circulatory and respiratory systems: Secondary | ICD-10-CM | POA: Insufficient documentation

## 2011-03-25 DIAGNOSIS — R059 Cough, unspecified: Secondary | ICD-10-CM | POA: Insufficient documentation

## 2011-03-25 DIAGNOSIS — I428 Other cardiomyopathies: Secondary | ICD-10-CM

## 2011-03-25 DIAGNOSIS — Z79899 Other long term (current) drug therapy: Secondary | ICD-10-CM | POA: Insufficient documentation

## 2011-03-25 DIAGNOSIS — Z8709 Personal history of other diseases of the respiratory system: Secondary | ICD-10-CM

## 2011-03-25 DIAGNOSIS — R5381 Other malaise: Secondary | ICD-10-CM | POA: Insufficient documentation

## 2011-03-25 DIAGNOSIS — I5022 Chronic systolic (congestive) heart failure: Secondary | ICD-10-CM | POA: Insufficient documentation

## 2011-03-25 DIAGNOSIS — J3489 Other specified disorders of nose and nasal sinuses: Secondary | ICD-10-CM | POA: Insufficient documentation

## 2011-03-25 LAB — CBC
HCT: 38.5 % (ref 36.0–46.0)
Hemoglobin: 12.7 g/dL (ref 12.0–15.0)
MCH: 29.2 pg (ref 26.0–34.0)
MCHC: 33 g/dL (ref 30.0–36.0)
MCV: 88.5 fL (ref 78.0–100.0)
Platelets: 212 10*3/uL (ref 150–400)
RBC: 4.35 MIL/uL (ref 3.87–5.11)
RDW: 13.3 % (ref 11.5–15.5)
WBC: 14.7 10*3/uL — ABNORMAL HIGH (ref 4.0–10.5)

## 2011-03-25 LAB — COMPREHENSIVE METABOLIC PANEL
ALT: 8 U/L (ref 0–35)
AST: 10 U/L (ref 0–37)
Albumin: 3.1 g/dL — ABNORMAL LOW (ref 3.5–5.2)
Alkaline Phosphatase: 29 U/L — ABNORMAL LOW (ref 39–117)
BUN: 15 mg/dL (ref 6–23)
CO2: 29 mEq/L (ref 19–32)
Calcium: 9.1 mg/dL (ref 8.4–10.5)
Chloride: 105 mEq/L (ref 96–112)
Creatinine, Ser: 0.9 mg/dL (ref 0.50–1.10)
GFR calc Af Amer: 77 mL/min — ABNORMAL LOW (ref >90)
GFR calc non Af Amer: 66 mL/min — ABNORMAL LOW (ref >90)
Glucose, Bld: 87 mg/dL (ref 70–99)
Potassium: 3 mEq/L — ABNORMAL LOW (ref 3.5–5.1)
Sodium: 143 mEq/L (ref 135–145)
Total Bilirubin: 0.4 mg/dL (ref 0.3–1.2)
Total Protein: 6.8 g/dL (ref 6.0–8.3)

## 2011-03-25 LAB — DIFFERENTIAL
Basophils Absolute: 0 10*3/uL (ref 0.0–0.1)
Basophils Relative: 0 % (ref 0–1)
Eosinophils Absolute: 0 10*3/uL (ref 0.0–0.7)
Eosinophils Relative: 0 % (ref 0–5)
Lymphocytes Relative: 13 % (ref 12–46)
Lymphs Abs: 2 10*3/uL (ref 0.7–4.0)
Monocytes Absolute: 0.9 10*3/uL (ref 0.1–1.0)
Monocytes Relative: 6 % (ref 3–12)
Neutro Abs: 11.9 10*3/uL — ABNORMAL HIGH (ref 1.7–7.7)
Neutrophils Relative %: 81 % — ABNORMAL HIGH (ref 43–77)

## 2011-03-25 LAB — CARDIAC PANEL(CRET KIN+CKTOT+MB+TROPI)
CK, MB: 2.7 ng/mL (ref 0.3–4.0)
Relative Index: INVALID (ref 0.0–2.5)
Total CK: 40 U/L (ref 7–177)
Troponin I: <0.3 ng/mL (ref <0.30)

## 2011-03-25 LAB — PRO B NATRIURETIC PEPTIDE: Pro B Natriuretic peptide (BNP): 4418 pg/mL — ABNORMAL HIGH (ref 0–125)

## 2011-03-25 MED ORDER — IPRATROPIUM BROMIDE 0.02 % IN SOLN
0.5000 mg | Freq: Once | RESPIRATORY_TRACT | Status: AC
  Administered 2011-03-25: 0.5 mg via RESPIRATORY_TRACT
  Filled 2011-03-25: qty 2.5

## 2011-03-25 MED ORDER — ALBUTEROL SULFATE HFA 108 (90 BASE) MCG/ACT IN AERS: 2.0000 | INHALATION_SPRAY | RESPIRATORY_TRACT | Status: DC | PRN

## 2011-03-25 MED ORDER — ALBUTEROL SULFATE (5 MG/ML) 0.5% IN NEBU
5.0000 mg | INHALATION_SOLUTION | Freq: Once | RESPIRATORY_TRACT | Status: AC
  Administered 2011-03-25: 5 mg via RESPIRATORY_TRACT
  Filled 2011-03-25: qty 1

## 2011-03-25 MED ORDER — POTASSIUM CHLORIDE 20 MEQ/15ML (10%) PO LIQD
ORAL | Status: AC
  Administered 2011-03-25: 40 meq
  Filled 2011-03-25: qty 30

## 2011-03-25 MED ORDER — POTASSIUM CHLORIDE CRYS ER 20 MEQ PO TBCR
40.0000 meq | EXTENDED_RELEASE_TABLET | Freq: Once | ORAL | Status: AC
  Administered 2011-03-25: 40 meq via ORAL
  Filled 2011-03-25: qty 2

## 2011-03-25 MED ORDER — DOXYCYCLINE HYCLATE 100 MG PO CAPS: 100.0000 mg | ORAL_CAPSULE | Freq: Two times a day (BID) | ORAL | Status: AC

## 2011-03-25 MED ORDER — METHYLPREDNISOLONE SODIUM SUCC 125 MG IJ SOLR
125.0000 mg | Freq: Once | INTRAMUSCULAR | Status: AC
  Administered 2011-03-25: 125 mg via INTRAVENOUS
  Filled 2011-03-25: qty 2

## 2011-03-25 NOTE — Patient Instructions (Signed)
To ER

## 2011-03-25 NOTE — ED Notes (Signed)
Potassium tablets not administered.  Pt refused and states she cannot swallow tablets.  Elixir given.

## 2011-03-25 NOTE — ED Notes (Signed)
Spo2 after walking 1.5 laps around department was 88% HR 72.  Pt states "I dont feel as good as I could but I dont feel as bad as I have."  After resting Pt SPO2 was 95% HR 67.  BP 183/63.  Pt states "she is ready to go."

## 2011-03-25 NOTE — Discharge Instructions (Signed)
Chronic Bronchitis and Emphysema Exacerbation You have chronic obstructive lung disease which has gotten worse. This can be an increase in your cough, wheezing, or shortness of breath. These problems are often worse during periods of air pollution or if you are exposed to smoke. HOME CARE INSTRUCTIONS   You should avoid all substances which irritate the airway, especially tobacco smoke.   If you are a smoker, consider using nicotine gum or patches to quit. Quitting smoking is very important to prevent worsening of this disease.   Increasing oral fluids and using a cool air vaporizer can help thin bronchial secretions. This makes it easier to clear your chest when you cough.   If you have a home nebulizer and oxygen, continue to use them as directed.  The following medications may be prescribed to help you depending on what your caregiver finds today.   Antibiotics.   Bronchodilators (inhaled or tablets).   Cortisone medicines (inhaled or tablets).   It is important to use good technique with inhaled medications, and spacer devices may be needed to help improve drug delivery. Chronic lung disease is frequently severe and hospital care may be needed. Be sure to follow-up as recommended with your primary caregiver.   Take all medications as directed.  SEEK IMMEDIATE MEDICAL CARE IF:  You develop extreme shortness of breath.   You have severe chest pain or blood in your sputum.   You develop a high fever, weakness, repeated vomiting, or fainting.   You develop confusion.  MAKE SURE YOU:   Understand these instructions.   Will watch your condition.   Will get help right away if you are not doing well or get worse.  Document Released: 11/11/2006 Document Revised: 07/30/2010 Document Reviewed: 11/11/2006 Exit[employer] Patient Information 2012 [employer]. 

## 2011-03-25 NOTE — Progress Notes (Signed)
Subjective:    Patient ID: Tonya Harper, female    DOB: February 01, 1945, 66 y.o.   MRN: [id number]  HPI Tonya Harper presents for follow up of COPD exacerbation. She has EF of approx 20% and moderate COPDE.   States she was doing well until yesterday evening when she noted more congestion and Resting SOB.  She is on oral prednisone taper and Symbicort.  Get SOB when talking today.  No fever   Allergies  Allergen Reactions  . Aspirin     nausea and dizziness after taking for one month at a time.. States her physician told her to use the 81 mg vs the 325mg .  . Shellfish Allergy     Medication withdrawal symptoms   Past Medical History  Diagnosis Date  . CAD   . Chronic systolic heart failure   . COPD   . Ischemic cardiomyopathy   . Anemia   . Hypertension   . Urge incontinence of urine   . CHF (congestive heart failure)   . Hearing loss   . Foot swelling   . Rectal bleeding   . Personal history of colonic polyps 02/27/2011  . Hemorrhoids, Right posterior, internal, with prolapse & bleeding 02/27/2011   Past Surgical History  Procedure Date  . Tumor removed   . Abdominal hysterectomy 1976   History   Social History  . Marital Status: Married    Spouse Name: N/A    Number of Children: N/A  . Years of Education: N/A   Occupational History  . unemployed    Social History Main Topics  . Smoking status: Former Smoker -- 0.5 packs/day for 40 years    Types: Cigarettes    Quit date: 02/14/2011  . Smokeless tobacco: Never Used  . Alcohol Use: Yes     socially once or twice per year  . Drug Use: No  . Sexually Active: Yes    Birth Control/ Protection: Surgical   Other Topics Concern  . Not on file   Social History Narrative  . No narrative on file   Family History  Problem Relation Age of Onset  . Heart attack Father   . Early death Father 4  . Stroke Father   . Heart disease Father   . Coronary artery disease Brother   . Heart disease Brother     MI @ 68  . Kidney  disease Mother   . Heart disease Sister     MI @ 69  . Heart disease Brother     MI in 30s   Patient Active Problem List  Diagnoses  . PURE HYPERCHOLESTEROLEMIA  . HYPOKALEMIA  . OTHER SPECIFIED ANEMIAS  . TOBACCO ABUSE  . CAD  . CARDIOMYOPATHY  . CHRONIC SYSTOLIC HEART FAILURE  . COPD  . OTHER SPEC FORMS CHRONIC ISCHEMIC HEART DISEASE  . Bruit  . Hemorrhoids, Right posterior, internal, with prolapse & bleeding  . Personal history of colonic polyps, last colonoscopy 1996 (x6 then)   Current Outpatient Prescriptions on File Prior to Visit  Medication Sig Dispense Refill  . acetaminophen (TYLENOL) 325 MG tablet Take 650 mg by mouth every 6 (six) hours as needed. For pain      . albuterol (PROVENTIL HFA;VENTOLIN HFA) 108 (90 BASE) MCG/ACT inhaler Inhale 1-2 puffs into the lungs every 6 (six) hours as needed for wheezing.  1 Inhaler  6  . budesonide-formoterol (SYMBICORT) 160-4.5 MCG/ACT inhaler Inhale 2 puffs into the lungs 2 (two) times daily.  1 Inhaler  3  .  carvedilol (COREG) 12.5 MG tablet Take 1 tablet (12.5 mg total) by mouth 2 (two) times daily with a meal.  180 tablet  3  . cloNIDine (CATAPRES) 0.1 MG tablet Take one tablet each evening  90 tablet  3  . hydrocortisone-pramoxine (ANALPRAM-HC) 2.5-1 % rectal cream Place 1 applicator rectally 4 (four) times daily as needed.      Marland Kitchen lisinopril (PRINIVIL,ZESTRIL) 40 MG tablet Take 40 mg by mouth daily.        . nitroGLYCERIN (NITROSTAT) 0.4 MG SL tablet Place 0.4 mg under the tongue every 5 (five) minutes as needed. For chest pain      . Oxybutynin (GELNIQUE) 3 (28) % (MG/ACT) GEL Place onto the skin daily.        . predniSONE (DELTASONE) 20 MG tablet Take 2 tablets by mouth daily for 3 days then 1 tablet by mouth daily  9 tablet  0      Review of Systems    see Above Objective:   Physical Exam Physical Exam  Nursing note and vitals reviewed. Alert pulse ox 95%  RR 26.  Has dyspnea with normal conversation Constitutional:  She is oriented to person, place, and time. She is then asthenic  HENT:  Head: Normocephalic and atraumatic.  Cardiovascular: Normal rate and regular rhythm. Exam reveals no gallop and no friction rub.  No murmur heard.  Pulmonary/Chest: Breath sounds normal. She has no wheezes. She has no rales.  Poor i/E effort, rhonchii bilaeterally  Neurological: She is alert and oriented to person, place, and time.  Skin: Skin is warm and dry.  Psychiatric: She has a normal mood and affect. Her behavior is normal.       Assessment & Plan:  1)  COPD exaberbation   Spoke with ER MD.  Will send to ER for IV treatment and serial nebulizers.  She is agreeable 2)  Ischemic cardiomyopathy  EF 20%  Pt is an ICD candidate but she is hesitant now 3)  HTN

## 2011-03-25 NOTE — ED Notes (Signed)
Pt reports SHOB and recently treated for bronchitis.  She was seen by PMD this am and sent to ED for further evaluation.

## 2011-03-25 NOTE — ED Provider Notes (Signed)
History     CSN: [id number]  Arrival date & time 03/25/11  4098   First MD Initiated Contact with Patient 03/25/11 1006      Chief Complaint  Patient presents with  . Shortness of Breath  . Cough    (Consider location/radiation/quality/duration/timing/severity/associated sxs/prior treatment) HPI Comments: Patient sent from Dr. Lonell Face office with SOB and cough.  She was seen at the February 21st and treated for bronchitis with nebulizers and steroids. She was rechecked again this morning in the clinic and noted to be tachypneic and dyspneic. Patient states she came winded walking from her car to the building.  He states he is a dry cough denies fever or chest pain or chills. Denies any lower extremity swelling. She has a history of COPD as well as ischemic cardiomyopathy with an EF of 20%.  She endorses some rhinorrhea, congestion and sore throat. States compliance with meds.  The history is provided by the patient.    Past Medical History  Diagnosis Date  . CAD   . Chronic systolic heart failure   . COPD   . Ischemic cardiomyopathy   . Anemia   . Hypertension   . Urge incontinence of urine   . CHF (congestive heart failure)   . Hearing loss   . Foot swelling   . Rectal bleeding   . Personal history of colonic polyps 02/27/2011  . Hemorrhoids, Right posterior, internal, with prolapse & bleeding 02/27/2011    Past Surgical History  Procedure Date  . Tumor removed   . Abdominal hysterectomy 1976    Family History  Problem Relation Age of Onset  . Heart attack Father   . Early death Father 62  . Stroke Father   . Heart disease Father   . Coronary artery disease Brother   . Heart disease Brother     MI @ 51  . Kidney disease Mother   . Heart disease Sister     MI @ 19  . Heart disease Brother     MI in 33s    History  Substance Use Topics  . Smoking status: Former Smoker -- 0.5 packs/day for 40 years    Types: Cigarettes    Quit date: 02/14/2011  .  Smokeless tobacco: Never Used  . Alcohol Use: Yes     socially once or twice per year    OB History    Grav Para Term Preterm Abortions TAB SAB Ect Mult Living   3 2   1  1          Review of Systems  Constitutional: Positive for activity change and fatigue. Negative for fever.  HENT: Positive for congestion, sore throat and rhinorrhea.   Respiratory: Positive for cough and shortness of breath. Negative for chest tightness.   Cardiovascular: Negative for chest pain and leg swelling.  Gastrointestinal: Negative for nausea, vomiting and abdominal pain.  Genitourinary: Negative for dysuria and hematuria.  Musculoskeletal: Negative for back pain.  Skin: Negative for rash.  Neurological: Negative for weakness and headaches.    Allergies  Aspirin and Shellfish allergy  Home Medications   Current Outpatient Rx  Name Route Sig Dispense Refill  . ACETAMINOPHEN 325 MG PO TABS Oral Take 650 mg by mouth every 6 (six) hours as needed. For pain    . ALBUTEROL SULFATE HFA 108 (90 BASE) MCG/ACT IN AERS Inhalation Inhale 1-2 puffs into the lungs every 6 (six) hours as needed for wheezing. 1 Inhaler 6  . ALBUTEROL SULFATE HFA  108 (90 BASE) MCG/ACT IN AERS Inhalation Inhale 2 puffs into the lungs every 4 (four) hours as needed for wheezing. 1 Inhaler 0  . BUDESONIDE-FORMOTEROL FUMARATE 160-4.5 MCG/ACT IN AERO Inhalation Inhale 2 puffs into the lungs 2 (two) times daily. 1 Inhaler 3  . CARVEDILOL 12.5 MG PO TABS Oral Take 1 tablet (12.5 mg total) by mouth 2 (two) times daily with a meal. 180 tablet 3  . CLONIDINE HCL 0.1 MG PO TABS  Take one tablet each evening 90 tablet 3  . DOXYCYCLINE HYCLATE 100 MG PO CAPS Oral Take 1 capsule (100 mg total) by mouth 2 (two) times daily. 20 capsule 0  . HYDROCORTISONE ACE-PRAMOXINE 2.5-1 % RE CREA Rectal Place 1 applicator rectally 4 (four) times daily as needed.    Marland Kitchen LISINOPRIL 40 MG PO TABS Oral Take 40 mg by mouth daily.      Marland Kitchen NITROGLYCERIN 0.4 MG SL SUBL  Sublingual Place 0.4 mg under the tongue every 5 (five) minutes as needed. For chest pain    . OXYBUTYNIN 3 (28) % (MG/ACT) TD GEL Transdermal Place onto the skin daily.      Marland Kitchen PRAVASTATIN SODIUM 20 MG PO TABS  TAKE ONE TABLET BY MOUTH DAILY AT BEDTIME 90 tablet 3  . PREDNISONE 20 MG PO TABS  Take 2 tablets by mouth daily for 3 days then 1 tablet by mouth daily 9 tablet 0    BP 167/76  Pulse 64  Temp(Src) 97.9 F (36.6 C) (Oral)  Resp 18  Ht 5\' 4"  (1.626 m)  Wt 90 lb (40.824 kg)  BMI 15.45 kg/m2  SpO2 92%  Physical Exam  Constitutional: She is oriented to person, place, and time. She appears well-developed and well-nourished. No distress.       No distress, speaking in full sentences.  HENT:  Head: Normocephalic and atraumatic.  Mouth/Throat: Oropharynx is clear and moist. No oropharyngeal exudate.  Eyes: Conjunctivae are normal. Pupils are equal, round, and reactive to light.  Neck: Normal range of motion.  Cardiovascular: Normal rate, regular rhythm and normal heart sounds.   Pulmonary/Chest: Effort normal. No respiratory distress.       Decreased air entry bilaterally, poor air exchange  Abdominal: Soft. There is no tenderness. There is no rebound and no guarding.  Musculoskeletal: Normal range of motion. She exhibits no edema.       No LE swelling  Neurological: She is alert and oriented to person, place, and time. No cranial nerve deficit.  Skin: Skin is warm.    ED Course  Procedures (including critical care time)  Labs Reviewed  CBC - Abnormal; Notable for the following:    WBC 14.7 (*)    All other components within normal limits  DIFFERENTIAL - Abnormal; Notable for the following:    Neutrophils Relative 81 (*)    Neutro Abs 11.9 (*)    All other components within normal limits  PRO B NATRIURETIC PEPTIDE - Abnormal; Notable for the following:    Pro B Natriuretic peptide (BNP) 4418.0 (*)    All other components within normal limits  COMPREHENSIVE METABOLIC  PANEL - Abnormal; Notable for the following:    Potassium 3.0 (*)    Albumin 3.1 (*)    Alkaline Phosphatase 29 (*)    GFR calc non Af Amer 66 (*)    GFR calc Af Amer 77 (*)    All other components within normal limits  CARDIAC PANEL(CRET KIN+CKTOT+MB+TROPI)  BASIC METABOLIC PANEL  CARDIAC PANEL(CRET  KIN+CKTOT+MB+TROPI)  PRO B NATRIURETIC PEPTIDE   Dg Chest 2 View  03/25/2011  *RADIOLOGY REPORT*  Clinical Data: Shortness of breath, residual cough, recent bronchitis  CHEST - 2 VIEW  Comparison: 02/11/2011  Findings: Bronchitic changes.  No focal consolidation. No pleural effusion or pneumothorax.  The heart is top normal in size.  Mild degenerative changes of the visualized thoracolumbar spine.  Surgical clips overlying the upper mediastinum.  IMPRESSION: No evidence of acute cardiopulmonary disease.  Original Report Authenticated By: Charline Bills, M.D.      1. COPD exacerbation       MDM   cough, shortness of breath, history of COPD and ischemic cardiomyopathy.  Recently treated for bronchitis.    CXR clear.  Work of breathing improved after neb.  Ambulatory in department with desaturation to 89% with quick recovery.  Patient requesting to go home.  Lungs clear.  Will add doxycycline to neb and steroid regimen. Ambulated again without desaturation below 92%   Date: 03/25/2011  Rate: 59  Rhythm: normal sinus rhythm  QRS Axis: normal  Intervals: normal  ST/T Wave abnormalities: nonspecific ST/T changes  Conduction Disutrbances:none  Narrative Interpretation: Stable inferior Q waves. Stable biphasic T waves laterally  Old EKG Reviewed: unchanged         Glynn Octave, MD 03/25/11 1745

## 2011-04-08 ENCOUNTER — Ambulatory Visit (INDEPENDENT_AMBULATORY_CARE_PROVIDER_SITE_OTHER): Payer: 59 | Admitting: Critical Care Medicine

## 2011-04-08 ENCOUNTER — Encounter: Payer: Self-pay | Admitting: Critical Care Medicine

## 2011-04-08 DIAGNOSIS — J449 Chronic obstructive pulmonary disease, unspecified: Secondary | ICD-10-CM

## 2011-04-08 DIAGNOSIS — F17200 Nicotine dependence, unspecified, uncomplicated: Secondary | ICD-10-CM

## 2011-04-08 NOTE — Assessment & Plan Note (Signed)
A1AT levels normal 1/13 PFT 1/13: FeV1 38%  FVC 72%  DLCO 41% TLC 62%  : combined severe restriction/obstruction Moderate Copd with recent exacerbation 03/21/11  now improved off smoking  Plan Cont symbicort two puff bid Prn SABA No further prednisone

## 2011-04-08 NOTE — Patient Instructions (Signed)
No change in medications. Return in        2 months 

## 2011-04-08 NOTE — Progress Notes (Signed)
Subjective:    Patient ID: Tonya Harper, female    DOB: October 22, 1945, 66 y.o.   MRN: [id number]  HPI Comments: Hx of dyspnea.  Dx bronchitis in the past, will flare twice a year.  Current flare since 10/12.  Now is a bit better, not back to 100%  66 y.o. WF   02/25/11 Last seen 02/07/11  A1AT assay was normal PFTs done: Moderate obstruction with DLCO 41%  Since last ov 1/10 pt went to ED 1/14.  Rx pred taper.  CXR neg for pna.  Since that visit is better.  Is off cigarettes .   Cough is better.  Not as dyspneic as before  Pt denies any significant sore throat, nasal congestion or excess secretions, fever, chills, sweats, unintended weight loss, pleurtic or exertional chest pain, orthopnea PND, or leg swelling Pt denies any increase in rescue therapy over baseline, denies waking up needing it or having any early am or nocturnal exacerbations of coughing/wheezing/or dyspnea. Pt also denies any obvious fluctuation in symptoms with  weather or environmental change or other alleviating or aggravating factors  04/08/2011 At last ov we rec to stay on symbicort No cough now and breathing is better.  Notes sl wheeze.  Did go to ED for 03/21/11.  Pt kept for the day. Rx more prednisone and doxycycline.  CXR was clear.  Pt has finished this.     Past Medical History  Diagnosis Date  . CAD   . Chronic systolic heart failure   . COPD   . Ischemic cardiomyopathy   . Anemia   . Hypertension   . Urge incontinence of urine   . CHF (congestive heart failure)   . Hearing loss   . Foot swelling   . Rectal bleeding   . Personal history of colonic polyps 02/27/2011  . Hemorrhoids, Right posterior, internal, with prolapse & bleeding 02/27/2011     Family History  Problem Relation Age of Onset  . Heart attack Father   . Early death Father 68  . Stroke Father   . Heart disease Father   . Coronary artery disease Brother   . Heart disease Brother     MI @ 62  . Kidney disease Mother   . Heart  disease Sister     MI @ 2  . Heart disease Brother     MI in 59s     History   Social History  . Marital Status: Married    Spouse Name: N/A    Number of Children: N/A  . Years of Education: N/A   Occupational History  . unemployed    Social History Main Topics  . Smoking status: Former Smoker -- 0.5 packs/day for 40 years    Types: Cigarettes    Quit date: 02/14/2011  . Smokeless tobacco: Never Used  . Alcohol Use: Yes     socially once or twice per year  . Drug Use: No  . Sexually Active: Yes    Birth Control/ Protection: Surgical   Other Topics Concern  . Not on file   Social History Narrative  . No narrative on file     Allergies  Allergen Reactions  . Aspirin     nausea and dizziness after taking for one month at a time.. States her physician told her to use the 81 mg vs the 325mg .  . Shellfish Allergy     Medication withdrawal symptoms     Outpatient Prescriptions Prior to Visit  Medication Sig Dispense  Refill  . acetaminophen (TYLENOL) 325 MG tablet Take 650 mg by mouth every 6 (six) hours as needed. For pain      . albuterol (PROVENTIL HFA;VENTOLIN HFA) 108 (90 BASE) MCG/ACT inhaler Inhale 1-2 puffs into the lungs every 6 (six) hours as needed for wheezing.  1 Inhaler  6  . budesonide-formoterol (SYMBICORT) 160-4.5 MCG/ACT inhaler Inhale 2 puffs into the lungs 2 (two) times daily.  1 Inhaler  3  . carvedilol (COREG) 12.5 MG tablet Take 1 tablet (12.5 mg total) by mouth 2 (two) times daily with a meal.  180 tablet  3  . cloNIDine (CATAPRES) 0.1 MG tablet Take one tablet each evening  90 tablet  3  . hydrocortisone-pramoxine (ANALPRAM-HC) 2.5-1 % rectal cream Place 1 applicator rectally 4 (four) times daily as needed.      Marland Kitchen lisinopril (PRINIVIL,ZESTRIL) 40 MG tablet Take 40 mg by mouth daily.        . nitroGLYCERIN (NITROSTAT) 0.4 MG SL tablet Place 0.4 mg under the tongue every 5 (five) minutes as needed. For chest pain      . Oxybutynin (GELNIQUE) 3 (28)  % (MG/ACT) GEL Place onto the skin daily.        . pravastatin (PRAVACHOL) 20 MG tablet TAKE ONE TABLET BY MOUTH DAILY AT BEDTIME  90 tablet  3  . albuterol (PROVENTIL HFA;VENTOLIN HFA) 108 (90 BASE) MCG/ACT inhaler Inhale 2 puffs into the lungs every 4 (four) hours as needed for wheezing.  1 Inhaler  0  . predniSONE (DELTASONE) 20 MG tablet Take 2 tablets by mouth daily for 3 days then 1 tablet by mouth daily  9 tablet  0     Review of Systems  Constitutional: Negative for chills, diaphoresis, activity change, appetite change, fatigue and unexpected weight change.  HENT: Positive for trouble swallowing. Negative for hearing loss, nosebleeds, congestion, facial swelling, sneezing, mouth sores, neck stiffness, dental problem, voice change, postnasal drip, sinus pressure, tinnitus and ear discharge.   Eyes: Negative for photophobia, discharge, redness, itching and visual disturbance.  Respiratory: Positive for shortness of breath. Negative for apnea, cough, choking, chest tightness and stridor.   Cardiovascular: Negative for palpitations.  Gastrointestinal: Negative for nausea, constipation, blood in stool and abdominal distention.  Genitourinary: Negative for dysuria, urgency, frequency, hematuria, flank pain, decreased urine volume and difficulty urinating.  Musculoskeletal: Negative for myalgias, back pain, joint swelling, arthralgias and gait problem.  Skin: Negative for color change and pallor.  Neurological: Negative for dizziness, tremors, seizures, syncope, speech difficulty, weakness, light-headedness and numbness.  Hematological: Negative for adenopathy. Does not bruise/bleed easily.  Psychiatric/Behavioral: Negative for confusion, sleep disturbance, dysphoric mood and agitation. The patient is not nervous/anxious.        Objective:   Physical Exam  Filed Vitals:   04/08/11 0915  BP: 170/78  Pulse: 59  Temp: 98.6 F (37 C)  TempSrc: Oral  Height: 5\' 4"  (1.626 m)  Weight: 88  lb (39.917 kg)  SpO2: 97%    Gen: Pleasant, well-nourished, in no distress,  normal affect  ENT: No lesions,  mouth clear,  oropharynx clear, no postnasal drip  Neck: No JVD, no TMG, no carotid bruits  Lungs: No use of accessory muscles, no dullness to percussion,distant BS without rales or rhonchi  Cardiovascular: RRR, heart sounds normal, no murmur or gallops, 1+  peripheral edema feet only  Abdomen: soft and NT, no HSM,  BS normal  Musculoskeletal: No deformities, no cyanosis or clubbing  Neuro: alert, non  focal  Skin: Warm, no lesions or rashes          Assessment & Plan:   COPD A1AT levels normal 1/13 PFT 1/13: FeV1 38%  FVC 72%  DLCO 41% TLC 62%  : combined severe restriction/obstruction Moderate Copd with recent exacerbation 03/21/11  now improved off smoking  Plan Cont symbicort two puff bid Prn SABA No further prednisone        Updated Medication List Outpatient Encounter Prescriptions as of 04/08/2011  Medication Sig Dispense Refill  . acetaminophen (TYLENOL) 325 MG tablet Take 650 mg by mouth every 6 (six) hours as needed. For pain      . albuterol (PROVENTIL HFA;VENTOLIN HFA) 108 (90 BASE) MCG/ACT inhaler Inhale 1-2 puffs into the lungs every 6 (six) hours as needed for wheezing.  1 Inhaler  6  . budesonide-formoterol (SYMBICORT) 160-4.5 MCG/ACT inhaler Inhale 2 puffs into the lungs 2 (two) times daily.  1 Inhaler  3  . carvedilol (COREG) 12.5 MG tablet Take 1 tablet (12.5 mg total) by mouth 2 (two) times daily with a meal.  180 tablet  3  . cloNIDine (CATAPRES) 0.1 MG tablet Take one tablet each evening  90 tablet  3  . hydrocortisone-pramoxine (ANALPRAM-HC) 2.5-1 % rectal cream Place 1 applicator rectally 4 (four) times daily as needed.      Marland Kitchen lisinopril (PRINIVIL,ZESTRIL) 40 MG tablet Take 40 mg by mouth daily.        . nitroGLYCERIN (NITROSTAT) 0.4 MG SL tablet Place 0.4 mg under the tongue every 5 (five) minutes as needed. For chest pain      .  Oxybutynin (GELNIQUE) 3 (28) % (MG/ACT) GEL Place onto the skin daily.        . pravastatin (PRAVACHOL) 20 MG tablet TAKE ONE TABLET BY MOUTH DAILY AT BEDTIME  90 tablet  3  . DISCONTD: albuterol (PROVENTIL HFA;VENTOLIN HFA) 108 (90 BASE) MCG/ACT inhaler Inhale 2 puffs into the lungs every 4 (four) hours as needed for wheezing.  1 Inhaler  0  . DISCONTD: doxycycline (VIBRAMYCIN) 100 MG capsule       . DISCONTD: predniSONE (DELTASONE) 20 MG tablet Take 2 tablets by mouth daily for 3 days then 1 tablet by mouth daily  9 tablet  0

## 2011-04-09 ENCOUNTER — Ambulatory Visit (INDEPENDENT_AMBULATORY_CARE_PROVIDER_SITE_OTHER): Payer: 59 | Admitting: Internal Medicine

## 2011-04-09 ENCOUNTER — Encounter: Payer: Self-pay | Admitting: Internal Medicine

## 2011-04-09 VITALS — BP 138/86 | HR 70 | Temp 99.2°F | Ht 64.0 in | Wt 84.5 lb

## 2011-04-09 DIAGNOSIS — Z139 Encounter for screening, unspecified: Secondary | ICD-10-CM

## 2011-04-09 DIAGNOSIS — R634 Abnormal weight loss: Secondary | ICD-10-CM | POA: Insufficient documentation

## 2011-04-09 DIAGNOSIS — I5022 Chronic systolic (congestive) heart failure: Secondary | ICD-10-CM

## 2011-04-09 DIAGNOSIS — E7800 Pure hypercholesterolemia, unspecified: Secondary | ICD-10-CM

## 2011-04-09 DIAGNOSIS — J449 Chronic obstructive pulmonary disease, unspecified: Secondary | ICD-10-CM

## 2011-04-09 DIAGNOSIS — F17200 Nicotine dependence, unspecified, uncomplicated: Secondary | ICD-10-CM

## 2011-04-09 DIAGNOSIS — I10 Essential (primary) hypertension: Secondary | ICD-10-CM

## 2011-04-09 LAB — LIPID PANEL
Cholesterol: 183 mg/dL (ref 0–200)
HDL: 60 mg/dL (ref >39)
LDL Cholesterol: 104 mg/dL — ABNORMAL HIGH (ref 0–99)
Total CHOL/HDL Ratio: 3.1 Ratio
Triglycerides: 94 mg/dL (ref <150)
VLDL: 19 mg/dL (ref 0–40)

## 2011-04-09 LAB — COMPREHENSIVE METABOLIC PANEL
ALT: 8 U/L (ref 0–35)
AST: 12 U/L (ref 0–37)
Albumin: 3.8 g/dL (ref 3.5–5.2)
Alkaline Phosphatase: 31 U/L — ABNORMAL LOW (ref 39–117)
BUN: 8 mg/dL (ref 6–23)
CO2: 28 mEq/L (ref 19–32)
Calcium: 9.3 mg/dL (ref 8.4–10.5)
Chloride: 105 mEq/L (ref 96–112)
Creat: 0.82 mg/dL (ref 0.50–1.10)
Glucose, Bld: 87 mg/dL (ref 70–99)
Potassium: 4 mEq/L (ref 3.5–5.3)
Sodium: 141 mEq/L (ref 135–145)
Total Bilirubin: 0.8 mg/dL (ref 0.3–1.2)
Total Protein: 6.9 g/dL (ref 6.0–8.3)

## 2011-04-09 LAB — TSH: TSH: 0.155 u[IU]/mL — ABNORMAL LOW (ref 0.350–4.500)

## 2011-04-09 NOTE — Patient Instructions (Signed)
To schedule mammogram  Schedule CPE with me    Labs will be mailed to you

## 2011-04-09 NOTE — Progress Notes (Signed)
Here for follow up from ER visit for COPD. States feels better, Oxygen saturation 95-96%

## 2011-04-09 NOTE — Progress Notes (Signed)
Subjective:    Patient ID: Tonya Harper, female    DOB: 03/23/1945, 66 y.o.   MRN: [id number]  HPI  Tonya Harper  Is here for follow up.  She is doing much better.  Last rescue use one time two days ago..  Using Symbicort 2 inhaltions bid.  NO cigarettes since Jan 17th.  No night time symptoms.    See weight.  She is down 6 lbs since I have first seen her.  She denies trying to lose weight.  She has not had a mammogram in several years.  Niece recently diagnosed with breast cancer.  NO FH of colon cancer but pt had 6 polyps on her last colonoscopy and she is way overdue.    See labs In ER  She had a low potassium and she states they gave her a liquid supplement but she is out of this now.    Allergies  Allergen Reactions  . Aspirin     nausea and dizziness after taking for one month at a time.. States her physician told her to use the 81 mg vs the 325mg .  . Shellfish Allergy     Medication withdrawal symptoms   Past Medical History  Diagnosis Date  . CAD   . Chronic systolic heart failure   . COPD   . Ischemic cardiomyopathy   . Anemia   . Hypertension   . Urge incontinence of urine   . CHF (congestive heart failure)   . Hearing loss   . Foot swelling   . Rectal bleeding   . Personal history of colonic polyps 02/27/2011  . Hemorrhoids, Right posterior, internal, with prolapse & bleeding 02/27/2011   Past Surgical History  Procedure Date  . Tumor removed   . Abdominal hysterectomy 1976   History   Social History  . Marital Status: Married    Spouse Name: N/A    Number of Children: N/A  . Years of Education: N/A   Occupational History  . unemployed    Social History Main Topics  . Smoking status: Former Smoker -- 0.5 packs/day for 40 years    Types: Cigarettes    Quit date: 02/14/2011  . Smokeless tobacco: Never Used  . Alcohol Use: Yes     socially once or twice per year  . Drug Use: No  . Sexually Active: Yes    Birth Control/ Protection: Surgical   Other Topics  Concern  . Not on file   Social History Narrative  . No narrative on file   Family History  Problem Relation Age of Onset  . Heart attack Father   . Early death Father 13  . Stroke Father   . Heart disease Father   . Coronary artery disease Brother   . Heart disease Brother     MI @ 41  . Kidney disease Mother   . Heart disease Sister     MI @ 37  . Heart disease Brother     MI in 37s   Patient Active Problem List  Diagnoses  . PURE HYPERCHOLESTEROLEMIA  . HYPOKALEMIA  . OTHER SPECIFIED ANEMIAS  . TOBACCO ABUSE  . CAD  . CARDIOMYOPATHY  . CHRONIC SYSTOLIC HEART FAILURE  . COPD  . OTHER SPEC FORMS CHRONIC ISCHEMIC HEART DISEASE  . Bruit  . Hemorrhoids, Right posterior, internal, with prolapse & bleeding  . Personal history of colonic polyps, last colonoscopy 1996 (x6 then)  . Weight loss   Current Outpatient Prescriptions on File Prior to Visit  Medication Sig Dispense Refill  . acetaminophen (TYLENOL) 325 MG tablet Take 650 mg by mouth every 6 (six) hours as needed. For pain      . albuterol (PROVENTIL HFA;VENTOLIN HFA) 108 (90 BASE) MCG/ACT inhaler Inhale 1-2 puffs into the lungs every 6 (six) hours as needed for wheezing.  1 Inhaler  6  . budesonide-formoterol (SYMBICORT) 160-4.5 MCG/ACT inhaler Inhale 2 puffs into the lungs 2 (two) times daily.  1 Inhaler  3  . carvedilol (COREG) 12.5 MG tablet Take 1 tablet (12.5 mg total) by mouth 2 (two) times daily with a meal.  180 tablet  3  . cloNIDine (CATAPRES) 0.1 MG tablet Take one tablet each evening  90 tablet  3  . hydrocortisone-pramoxine (ANALPRAM-HC) 2.5-1 % rectal cream Place 1 applicator rectally 4 (four) times daily as needed.      Marland Kitchen lisinopril (PRINIVIL,ZESTRIL) 40 MG tablet Take 40 mg by mouth daily.        . nitroGLYCERIN (NITROSTAT) 0.4 MG SL tablet Place 0.4 mg under the tongue every 5 (five) minutes as needed. For chest pain      . Oxybutynin (GELNIQUE) 3 (28) % (MG/ACT) GEL Place onto the skin daily.          . pravastatin (PRAVACHOL) 20 MG tablet TAKE ONE TABLET BY MOUTH DAILY AT BEDTIME  90 tablet  3       Review of Systems See HPI    Objective:   Physical Exam Physical Exam  Nursing note and vitals reviewed.  Constitutional: She is oriented to person, place, and time. She appears well-developed and well-nourished.  HENT:  Head: Normocephalic and atraumatic.  Cardiovascular: Normal rate and regular rhythm. Exam reveals no gallop and no friction rub.  No murmur heard.  Pulmonary/Chest: Breath sounds normal. She has no wheezes. She has no rales.  Neurological: She is alert and oriented to person, place, and time.  Skin: Skin is warm and dry.  Psychiatric: She has a normal mood and affect. Her behavior is normal.        Assessment & Plan:  1)  Hypokalemia  Will recheck today with other baseline labs 2)  Weight loss  I advised pt to get mammogram today but she wishes to reschedule.  Will also need colonoscopy.  She is advised to drink a minimum of one can of Ensure daily until next viist.  She will schedule CPE  Last CXR neg for mass 3)  HTN  Well controlled 4)  Hyperlipidemia  Recheck today

## 2011-04-10 LAB — CBC WITH DIFFERENTIAL/PLATELET
Basophils Absolute: 0 10*3/uL (ref 0.0–0.1)
Basophils Relative: 0 % (ref 0–1)
Eosinophils Absolute: 0.4 10*3/uL (ref 0.0–0.7)
Eosinophils Relative: 4 % (ref 0–5)
HCT: 36.5 % (ref 36.0–46.0)
Hemoglobin: 11.6 g/dL — ABNORMAL LOW (ref 12.0–15.0)
Lymphocytes Relative: 74 % — ABNORMAL HIGH (ref 12–46)
Lymphs Abs: 7.8 10*3/uL — ABNORMAL HIGH (ref 0.7–4.0)
MCH: 28.9 pg (ref 26.0–34.0)
MCHC: 31.8 g/dL (ref 30.0–36.0)
MCV: 91 fL (ref 78.0–100.0)
Monocytes Absolute: 0.8 10*3/uL (ref 0.1–1.0)
Monocytes Relative: 8 % (ref 3–12)
Neutro Abs: 1.5 10*3/uL — ABNORMAL LOW (ref 1.7–7.7)
Neutrophils Relative %: 14 % — ABNORMAL LOW (ref 43–77)
Platelets: 231 10*3/uL (ref 150–400)
RBC: 4.01 MIL/uL (ref 3.87–5.11)
RDW: 13.9 % (ref 11.5–15.5)
WBC: 10.5 10*3/uL (ref 4.0–10.5)

## 2011-04-10 LAB — VITAMIN D 25 HYDROXY (VIT D DEFICIENCY, FRACTURES): Vit D, 25-Hydroxy: <10 ng/mL — ABNORMAL LOW (ref 30–89)

## 2011-04-10 LAB — PATHOLOGIST SMEAR REVIEW

## 2011-04-11 NOTE — Progress Notes (Signed)
Addended by: Chip Boer on: 04/11/2011 02:34 PM   Modules accepted: Orders

## 2011-04-12 LAB — T4, FREE: Free T4: 0.9 ng/dL (ref 0.80–1.80)

## 2011-04-12 LAB — T3, FREE: T3, Free: 3.1 pg/mL (ref 2.3–4.2)

## 2011-04-15 ENCOUNTER — Ambulatory Visit (INDEPENDENT_AMBULATORY_CARE_PROVIDER_SITE_OTHER): Payer: 59 | Admitting: Surgery

## 2011-04-15 ENCOUNTER — Encounter (INDEPENDENT_AMBULATORY_CARE_PROVIDER_SITE_OTHER): Payer: Self-pay | Admitting: Surgery

## 2011-04-15 ENCOUNTER — Encounter: Payer: Self-pay | Admitting: Internal Medicine

## 2011-04-15 VITALS — BP 144/70 | HR 76 | Temp 98.4°F | Resp 16 | Ht 64.0 in | Wt 85.6 lb

## 2011-04-15 DIAGNOSIS — Z860100 Personal history of colon polyps, unspecified: Secondary | ICD-10-CM

## 2011-04-15 DIAGNOSIS — K648 Other hemorrhoids: Secondary | ICD-10-CM

## 2011-04-15 NOTE — Progress Notes (Signed)
Addended by: Ethlyn Gallery on: 04/15/2011 10:32 AM   Modules accepted: Orders

## 2011-04-15 NOTE — Progress Notes (Signed)
Subjective:     Patient ID: Tonya Harper, female   DOB: 1945/06/02, 66 y.o.   MRN: [id number]  HPI   Tonya Harper  May 13, 1945 [id number]  Patient Care Team: Kendrick Ranch, MD as PCP - General (Internal Medicine) Storm Frisk, MD as Attending Physician (Pulmonary Disease)  This patient is a 66 y.o.female who presents today for surgical evaluation at the request of Dr. Constance Goltz.   Reason for visit: Prolapsing hemorrhoids with bleeding.  The patient is a thin cachectic female who has struggled with bleeding and prolapse for about a year. It used to intermittently bulge but now it happens all the time. Prior history of hemorrhoid problems her anal rectal bleeding. She will get some bleeding with wiping. It is uncomfortable.  I banded her R posterior hemorrhoid.  She says that helped. However, she is noted to come back out again a little. Nonbleeding is better. Claims to have one soft bowel movement a day. She did not start any fiber. She saw her pulmonologist. She has stopped smoking.   She is still not gotten a colonoscopy.   She has a history of colon polyps based on a colonoscopy done in 1996. She thinks there were 6 benign polyps. She's never had a followup colonoscopy. She says she's been meaning to do that. Her weight usually was around 105 but after an accident 10 years ago she is not been able to keep weight. No major changes recently. No personal or family history of rectal cancer that she can recall. Normally has a bowel movement about every day. Occasionally firm. Occasionally loose after recent bouts of PO  antibiotic Tx for bronchitis.   Patient Active Problem List  Diagnoses  . PURE HYPERCHOLESTEROLEMIA  . HYPOKALEMIA  . OTHER SPECIFIED ANEMIAS  . TOBACCO ABUSE  . CAD  . CARDIOMYOPATHY  . CHRONIC SYSTOLIC HEART FAILURE  . COPD  . OTHER SPEC FORMS CHRONIC ISCHEMIC HEART DISEASE  . Bruit  . Hemorrhoids, internal, with prolapse & bleeding  . Personal history of  colonic polyps, last colonoscopy 1996 (x6 then)  . Weight loss    Past Medical History  Diagnosis Date  . CAD   . Chronic systolic heart failure   . COPD   . Ischemic cardiomyopathy   . Anemia   . Hypertension   . Urge incontinence of urine   . CHF (congestive heart failure)   . Hearing loss   . Foot swelling   . Rectal bleeding   . Personal history of colonic polyps 02/27/2011  . Hemorrhoids, Right posterior, internal, with prolapse & bleeding 02/27/2011    Past Surgical History  Procedure Date  . Tumor removed   . Abdominal hysterectomy 1976  . Band hemorrhoidectomy     History   Social History  . Marital Status: Married    Spouse Name: N/A    Number of Children: N/A  . Years of Education: N/A   Occupational History  . unemployed    Social History Main Topics  . Smoking status: Former Smoker -- 0.5 packs/day for 40 years    Types: Cigarettes    Quit date: 02/14/2011  . Smokeless tobacco: Never Used  . Alcohol Use: Yes     socially once or twice per year  . Drug Use: No  . Sexually Active: Yes    Birth Control/ Protection: Surgical   Other Topics Concern  . Not on file   Social History Narrative  . No narrative on file  Family History  Problem Relation Age of Onset  . Heart attack Father   . Early death Father 39  . Stroke Father   . Heart disease Father   . Coronary artery disease Brother   . Heart disease Brother     MI @ 58  . Cancer Brother     prostate  . Kidney disease Mother   . Heart disease Sister     MI @ 39  . Heart disease Brother     MI in 77s  . Cancer Brother     prostate    Current outpatient prescriptions:acetaminophen (TYLENOL) 325 MG tablet, Take 650 mg by mouth every 6 (six) hours as needed. For pain, Disp: , Rfl: ;  albuterol (PROVENTIL HFA;VENTOLIN HFA) 108 (90 BASE) MCG/ACT inhaler, Inhale 1-2 puffs into the lungs every 6 (six) hours as needed for wheezing., Disp: 1 Inhaler, Rfl: 6 budesonide-formoterol (SYMBICORT)  160-4.5 MCG/ACT inhaler, Inhale 2 puffs into the lungs 2 (two) times daily., Disp: 1 Inhaler, Rfl: 3;  carvedilol (COREG) 12.5 MG tablet, Take 1 tablet (12.5 mg total) by mouth 2 (two) times daily with a meal., Disp: 180 tablet, Rfl: 3;  cloNIDine (CATAPRES) 0.1 MG tablet, Take one tablet each evening, Disp: 90 tablet, Rfl: 3 hydrocortisone-pramoxine (ANALPRAM-HC) 2.5-1 % rectal cream, Place 1 applicator rectally 4 (four) times daily as needed., Disp: , Rfl: ;  lisinopril (PRINIVIL,ZESTRIL) 40 MG tablet, Take 40 mg by mouth daily.  , Disp: , Rfl: ;  nitroGLYCERIN (NITROSTAT) 0.4 MG SL tablet, Place 0.4 mg under the tongue every 5 (five) minutes as needed. For chest pain, Disp: , Rfl:  Oxybutynin (GELNIQUE) 3 (28) % (MG/ACT) GEL, Place onto the skin daily.  , Disp: , Rfl: ;  pravastatin (PRAVACHOL) 20 MG tablet, TAKE ONE TABLET BY MOUTH DAILY AT BEDTIME, Disp: 90 tablet, Rfl: 3  Allergies  Allergen Reactions  . Aspirin     nausea and dizziness after taking for one month at a time.. States her physician told her to use the 81 mg vs the 325mg .  . Shellfish Allergy     Medication withdrawal symptoms    BP 144/70  Pulse 76  Temp(Src) 98.4 F (36.9 C) (Temporal)  Resp 16  Ht 5\' 4"  (1.626 m)  Wt 85 lb 9.6 oz (38.828 kg)  BMI 14.69 kg/m2     Review of Systems  Constitutional: Negative for fever, chills, diaphoresis and fatigue.  HENT: Negative for ear pain, sore throat, trouble swallowing, neck pain and ear discharge.   Eyes: Negative for photophobia, discharge and visual disturbance.  Respiratory: Negative for cough, choking, chest tightness and shortness of breath.   Cardiovascular: Negative for chest pain and palpitations.       Patient walks 20 minutes for about 1/4 miles without difficulty.  No exertional chest/neck/shoulder/arm pain.  Gastrointestinal: Positive for anal bleeding. Negative for nausea, vomiting, abdominal pain, diarrhea, constipation, blood in stool, abdominal distention  and rectal pain.  Genitourinary: Negative for dysuria, frequency and difficulty urinating.  Musculoskeletal: Negative for myalgias and gait problem.  Skin: Negative for color change, pallor and rash.  Neurological: Negative for dizziness, speech difficulty, weakness and numbness.  Hematological: Negative for adenopathy.  Psychiatric/Behavioral: Negative for confusion and agitation. The patient is not nervous/anxious.        Objective:   Physical Exam  Constitutional: She is oriented to person, place, and time. She appears well-developed. She appears cachectic. She is active and cooperative.  Non-toxic appearance. She does not have  a sickly appearance. She does not appear ill. No distress.  HENT:  Head: Normocephalic.  Mouth/Throat: Oropharynx is clear and moist. No oropharyngeal exudate.  Eyes: Conjunctivae and EOM are normal. Pupils are equal, round, and reactive to light. No scleral icterus.  Neck: Normal range of motion. Neck supple. No tracheal deviation present.  Cardiovascular: Normal rate, regular rhythm and intact distal pulses.   Pulmonary/Chest: Effort normal and breath sounds normal. No respiratory distress. She exhibits no tenderness.  Abdominal: Soft. She exhibits no distension and no mass. There is no tenderness. Hernia confirmed negative in the right inguinal area and confirmed negative in the left inguinal area.  Genitourinary: No vaginal discharge found.       Perianal skin clean with good hygiene.  No pruritis.  No pilonidal disease.  No fissure.  No abscess/fistula.    Tolerates digital and anoscopic rectal exam.  Normal sphincter tone.  No rectal masses.    Prolapsing R post hemorrhoid of smaller but moderate size.  Left lateral pile enlarged.  R anterior hemorrhoidal pile WNL   Musculoskeletal: Normal range of motion. She exhibits no tenderness.  Lymphadenopathy:    She has no cervical adenopathy.       Right: No inguinal adenopathy present.       Left: No  inguinal adenopathy present.  Neurological: She is alert and oriented to person, place, and time. No cranial nerve deficit. She exhibits normal muscle tone. Coordination normal.  Skin: Skin is warm and dry. No rash noted. She is not diaphoretic. No erythema.  Psychiatric: She has a normal mood and affect. Her behavior is normal. Judgment and thought content normal.       Assessment:     Hemorrhoid internal prolapsed R posterior with L lateral imflamed pile also.    No evidence of full rectal prolapse/procidentia  H/o colon polyps, need for colonoscopy    Plan:     The hemorrhoid is better, however, I think it needs another banding. Given her COPD, I do not think she's a fair operative risk. She tolerated banding before. I offered to her again:  The anatomy & physiology of the anorectal region was discussed.  The pathophysiology of hemorrhoids and differential diagnosis was discussed.  Natural history progression  was discussed.   I stressed the importance of a bowel regimen to have daily soft bowel movements to minimize progression of disease.     The patient's symptoms are not adequately controlled.  Therefore, I recommended banding to treat the hemorrhoids.  I went over the technique, risks, benefits, and alternatives.   Goals of post-operative recovery were discussed as well.  Questions were answered.  The patient expressed understanding & wished to proceed.  The patient was positioned in the lateral decubitus position.  Perianal & rectal examination was done.  Using anoscopy, I ligated the R post hemorrhoid & L lateral hemorrhoid above the dentate line with banding x 1 for each pile.  The right posterior hemorrhoid reduced much better.  The patient tolerated the procedure well.  Educational handouts further explaining the pathology, treatment options, and bowel regimen were given as well.   May need repeat bending.  We are going to try & schedule the patient for colonoscopy. She is at high  risk for having polyps given the 6 polyps before.

## 2011-04-15 NOTE — Patient Instructions (Addendum)
GET A COLONOSCOPY!!!   HEMORRHOIDS   The rectum is the last few inches of your colon, and it naturally stretches to hold stool.  Hemorrhoidal piles are natural clusters of blood vessels that help the rectum stretch to hold stool and allow bowel movements to eliminate feces.  Hemorrhoids are abnormally swollen blood vessels in the rectum.  Too much pressure in the rectum causes hemorrhoids by forcing blood to stretch and bulge the walls of the veins, sometimes even rupturing them.  Hemorrhoids can become like varicose veins you might see on a person's legs. When bulging hemorrhoidal veins are irritated, they can swell, burn, itch, become very painful, and bleed. Once the rectal veins have been stretched out and hemorrhoids created, they are difficult to get rid of completely and tend to recur with less straining than it took to cause them in the first place. Fortunately, good habits and simple medical treatment usually control hemorrhoids well, and surgery is only recommended in unusually severe cases. Some of the most frequent causes of hemorrhoids:    Constant sitting    Straining with bowel movements (from constipation or hard stools)    Diarrhea    Sitting on the toilet for a long time    Severe coughing    Childbirth    Heavy Lifting  Types of Hemorrhoids:    Internal hemorrhoids usually don't hurt or itch; they are deep inside the rectum and usually have no sensation. However, internal hemorrhoids can bleed.  Such bleeding should not be ignored and mask blood from a dangerous source like colorectal cancer, so persistent rectal bleeding should be investigated with a colonoscopy.    External hemorrhoids cause most of the symptoms - pain, burning, and itching. Unirritated hemorrhoids can look like small skin tags coming out of the anus.     Thrombosed hemorrhoids can form when a hemorrhoid blood vessel bursts and causes the hemorrhoid to swell.  A purple blood clot can form in it and become an  excruciatingly painful lump at the anus. Because of these unpleasant symptoms, immediate incision and drainage by a surgeon at an office visit can provide much relief of the pain.    PREVENTION Avoiding the causes listed in above will prevent most cases of hemorrhoids, but this advice is sometimes hard to follow:  How can you avoid sitting all day if you have a seated job? Also, we try to avoid coughing and diarrhea, but sometimes it's beyond your control.  Still, there are some practical hints to help:    If your main job activity is seated, always stand or walk during your breaks. Make it a point to stand and walk at least 5 minutes every hour and try to shift frequently in your chair to avoid direct rectal pressure.    Always exhale as you strain or lift. Don't hold your breath.    Treat coughing, diarrhea and constipation early since irritated hemorrhoids may soon follow.    Do not delay or try to prevent a bowel movement when the urge is present.   Exercise regularly (walking or jogging 60 minutes a day) to stimulate the bowels to move.   Avoid dry toilet paper when cleaning after bowel movements.  Moistened tissues such as baby wipes are less irritating.  Lightly pat the rectal area dry.  Using irrigating showers or bottle irrigation washing can more gently clean this sensitive area.   Keep the anal and genital area clean and  dry.  Talcum or baby powders  can help   GET YOUR STOOLS SOFT.   This is the most important way to prevent irritated hemorrhoids.  Hard stools are like sandpaper to the anorectal canal and will cause more problems.   The goal: ONE SOFT BOWEL MOVEMENT A DAY!  To have soft, regular bowel movements:    Drink at least 8 tall glasses of water a day.     AVOID CONSTIPATION    Take plenty of fiber.  Fiber is the undigested part of plant food that passes into the colon, acting s "natures broom" to encourage bowel motility and movement.  Fiber can absorb and hold large amounts of  water. This results in a larger, bulkier stool, which is soft and easier to pass. Work gradually over several weeks up to 6 servings a day of fiber (25g a day even more if needed) in the form of: o Vegetables -- Root (potatoes, carrots, turnips), leafy green (lettuce, salad greens, celery, spinach), or cooked high residue (cabbage, broccoli, etc) o Fruit -- Fresh (unpeeled skin & pulp), Dried (prunes, apricots, cherries, etc ),  or stewed ( applesauce)  o Whole grain breads, pasta, etc (whole wheat)  o Bran cereals    Bulking Agents -- This type of water-retaining fiber generally is easily obtained each day by one of the following:  o Psyllium bran -- The psyllium plant is remarkable because its ground seeds can retain so much water. This product is available as Metamucil, Konsyl, Effersyllium, Per Diem Fiber, or the less expensive generic preparation in drug and health food stores. Although labeled a laxative, it really is not a laxative.  o Methylcellulose -- This is another fiber derived from wood which also retains water. It is available as Citrucel. o Polyethylene Glycol - and "artificial" fiber commonly called Miralax or Glycolax.  It is helpful for people with gassy or bloated feelings with regular fiber o Flax Seed - a less gassy fiber than psyllium   No reading or other relaxing activity while on the toilet. If bowel movements take longer than 5 minutes, you are too constipated   Laxatives can be useful for a short period if constipation is severe o Osmotics (Milk of Magnesia, Fleets phosphosoda, Magnesium citrate, MiraLax, GoLytely) are safer than  o Stimulants (Senokot, Castor Oil, Dulcolax, Ex Lax)    o Do not take laxatives for more than 7days in a row.   Laxatives are not a good long-term solution as it can stress the intestine and colon and causes too much mineral and fluid losses.    If badly constipated, try a Bowel Retraining Program: o Do not use laxatives.  o Eat a diet high in  roughage, such as bran cereals and leafy vegetables.  o Drink six (6) ounces of prune or apricot juice each morning.  o Eat two (2) large servings of stewed fruit each day.  o Take one (1) heaping dose of a bulking agent (ex. Metamucil, Citrucel, Miralax) twice a day.  o Use sugar-free sweetener when possible to avoid excessive calories.  o Eat a normal breakfast.  o Set aside 15 minutes after breakfast to sit on the toilet, but do not strain to have a bowel movement.  o If you do not have a bowel movement by the third day, use an enema and repeat the above steps.    AVOID DIARRHEA o Switch to liquids and simpler foods for a few days to avoid stressing your intestines further. o Avoid dairy products (especially milk &  ice cream) for a short time.  The intestines often can lose the ability to digest lactose when stressed. o Avoid foods that cause gassiness or bloating.  Typical foods include beans and other legumes, cabbage, broccoli, and dairy foods.  Every person has some sensitivity to other foods, so listen to our body and avoid those foods that trigger problems for you. o Adding fiber (Citrucel, Metamucil, psyllium, Miralax) gradually can help thicken stools by absorbing excess fluid and retrain the intestines to act more normally.  Slowly increase the dose over a few weeks.  Too much fiber too soon can backfire and cause cramping & bloating. o Probiotics (such as active yogurt, Align, etc) may help repopulate the intestines and colon with normal bacteria and calm down a sensitive digestive tract.  Most studies show it to be of mild help, though, and such products can be costly. o Medicines:   Bismuth subsalicylate (ex. Kayopectate, Pepto Bismol) every 30 minutes for up to 6 doses can help control diarrhea.  Avoid if pregnant.   Loperamide (Immodium) can slow down diarrhea.  Start with two tablets (4mg  total) first and then try one tablet every 6 hours.  Avoid if you are having fevers or severe  pain.  If you are not better or start feeling worse, stop all medicines and call your doctor for advice o Call your doctor if you are getting worse or not better.  Sometimes further testing (cultures, endoscopy, X-ray studies, bloodwork, etc) may be needed to help diagnose and treat the cause of the diarrhea.   If these preventive measures fail, you must take action right away! Hemorrhoids are one condition that can be mild in the morning and become intolerable by nightfall.

## 2011-04-17 ENCOUNTER — Encounter: Payer: Self-pay | Admitting: Internal Medicine

## 2011-04-22 ENCOUNTER — Encounter: Payer: Self-pay | Admitting: Internal Medicine

## 2011-04-22 ENCOUNTER — Ambulatory Visit (INDEPENDENT_AMBULATORY_CARE_PROVIDER_SITE_OTHER): Payer: 59 | Admitting: Internal Medicine

## 2011-04-22 VITALS — BP 152/74 | HR 72 | Ht 64.0 in | Wt 85.4 lb

## 2011-04-22 DIAGNOSIS — R1310 Dysphagia, unspecified: Secondary | ICD-10-CM

## 2011-04-22 DIAGNOSIS — Z1211 Encounter for screening for malignant neoplasm of colon: Secondary | ICD-10-CM

## 2011-04-22 DIAGNOSIS — R1314 Dysphagia, pharyngoesophageal phase: Secondary | ICD-10-CM

## 2011-04-22 DIAGNOSIS — S27819A Unspecified injury of esophagus (thoracic part), initial encounter: Secondary | ICD-10-CM | POA: Insufficient documentation

## 2011-04-22 DIAGNOSIS — R1319 Other dysphagia: Secondary | ICD-10-CM

## 2011-04-22 DIAGNOSIS — Z86010 Personal history of colonic polyps: Secondary | ICD-10-CM

## 2011-04-22 MED ORDER — PEG-KCL-NACL-NASULF-NA ASC-C 100 G PO SOLR: 1.0000 | Freq: Once | ORAL | Status: DC

## 2011-04-22 NOTE — Patient Instructions (Addendum)
You have been scheduled for a colonoscopy with propofol. Please follow written instructions given to you at your visit today.  Please pick up your prep kit at the pharmacy within the next 1-3 days.  You have been scheduled for a Barium [place] at [facility] radiology Dept. April 29, 2011 @ 9am  If you cannot make this appointment please call [phone] to reschedule.  .We have sent the following medications to your pharmacy for you to pick up at your convenience: moviprep, you were given instructions today at your visit.

## 2011-04-22 NOTE — Progress Notes (Signed)
Subjective:    Patient ID: Tonya Harper, female    DOB: [birth year], 66 y.o.   MRN: [id number]  HPI Ms. Wolven is a 66 year old female with a past medical history of CAD, CHF, hypertension, personal history of colon polyps, and recent internal hemorrhoidal banding who seen in consultation at the request of Dr. Michaell Cowing for evaluation of colonoscopy. The patient reports she has had one year of hemorrhoidal "bulging", bleeding and drainage of mucus and therefore she sought treatment for her internal hemorrhoids. She underwent banding several weeks ago and reports improvement in these symptoms subsequently. She also recalls a colonoscopy 8-10 years ago with removal of "6 polyps" which were reportedly benign. She is uncertain which type of polyps these were. She is also uncertain as to her recommended recall colonoscopy interval. She denies rectal bleeding and melena. She also denies abdominal pain or change in her bowel habits. She does report ongoing issues with swallowing, which she reports relate to an esophageal injury which occurred during chest surgery in 1997. She reports she had a mediastinal "mass" removed and during this time an esophageal injury requiring oversew. Since this time she has had trouble with solid food dysphagia and this has persisted. This is not getting worse, but she does report trouble swallowing pills and trouble with solid foods. She occasionally has difficulty swallowing liquids if she drinks too quickly. Again, this problem has been present since 1997 and has not been progressive. She denies odynophagia. She does not recall a previous history of EGD, but does recall barium swallow done approximately one month after her chest surgery in 1997. No nausea or vomiting. No fevers or chills  Review of Systems As per history of present illness, otherwise notable for back pain, occasional headaches, occasional muscle cramps, sleeping difficulty, and lower extremity swelling  Past Medical  History  Diagnosis Date  . CAD   . Chronic systolic heart failure   . COPD   . Ischemic cardiomyopathy   . Anemia   . Hypertension   . Urge incontinence of urine   . CHF (congestive heart failure)   . Hearing loss   . Foot swelling   . Rectal bleeding   . Personal history of colonic polyps 02/27/2011  . Hemorrhoids, Right posterior, internal, with prolapse & bleeding 02/27/2011   Past Surgical History  Procedure Date  . Tumor removed   . Abdominal hysterectomy 1976  . Band hemorrhoidectomy   . Appendectomy    Current Outpatient Prescriptions  Medication Sig Dispense Refill  . acetaminophen (TYLENOL) 325 MG tablet Take 650 mg by mouth every 6 (six) hours as needed. For pain      . albuterol (PROVENTIL HFA;VENTOLIN HFA) 108 (90 BASE) MCG/ACT inhaler Inhale 1-2 puffs into the lungs every 6 (six) hours as needed for wheezing.  1 Inhaler  6  . budesonide-formoterol (SYMBICORT) 160-4.5 MCG/ACT inhaler Inhale 2 puffs into the lungs 2 (two) times daily.  1 Inhaler  3  . carvedilol (COREG) 12.5 MG tablet Take 1 tablet (12.5 mg total) by mouth 2 (two) times daily with a meal.  180 tablet  3  . cloNIDine (CATAPRES) 0.1 MG tablet Take one tablet each evening  90 tablet  3  . hydrocortisone-pramoxine (ANALPRAM-HC) 2.5-1 % rectal cream Place 1 applicator rectally 4 (four) times daily as needed.      Marland Kitchen lisinopril (PRINIVIL,ZESTRIL) 40 MG tablet Take 40 mg by mouth daily.        . nitroGLYCERIN (NITROSTAT) 0.4 MG SL tablet Place  0.4 mg under the tongue every 5 (five) minutes as needed. For chest pain      . Oxybutynin (GELNIQUE) 3 (28) % (MG/ACT) GEL Place onto the skin daily.        . pravastatin (PRAVACHOL) 20 MG tablet TAKE ONE TABLET BY MOUTH DAILY AT BEDTIME  90 tablet  3  . peg 3350 powder (MOVIPREP) 100 G SOLR Take 1 kit (100 g total) by mouth once.  1 kit  0   Allergies  Allergen Reactions  . Aspirin     nausea and dizziness after taking for one month at a time.. States her physician  told her to use the 81 mg vs the 325mg .  . Shellfish Allergy     Medication withdrawal symptoms   History   Social History  . Marital Status: Married    Spouse Name: N/A    Number of Children: 2  . Years of Education: N/A   Occupational History  . unemployed     Retired   Social History Main Topics  . Smoking status: Former Smoker -- 0.5 packs/day for 40 years    Types: Cigarettes    Quit date: 02/14/2011  . Smokeless tobacco: Never Used  . Alcohol Use: Yes     socially once or twice per year  . Drug Use: No  . Sexually Active: Yes    Birth Control/ Protection: Surgical   Other Topics Concern  . None   Social History Narrative  . None   Family History  Problem Relation Age of Onset  . Heart attack Father   . Early death Father 68  . Stroke Father   . Heart disease Father   . Coronary artery disease Brother   . Heart disease Brother     MI @ 70  . Cancer Brother     prostate  . Kidney disease Mother   . Heart disease Sister     MI @ 67  . Heart disease Brother     MI in 8s  . Cancer Brother     prostate      Objective:   Physical Exam BP 152/74  Pulse 72  Ht 5\' 4"  (1.626 m)  Wt 85 lb 6.4 oz (38.737 kg)  BMI 14.66 kg/m2 Constitutional: Well-developed and thin female. No distress. HEENT: Normocephalic and atraumatic. Oropharynx is clear and moist. No oropharyngeal exudate. Conjunctivae are normal. Pupils are equal round and reactive to light. No scleral icterus. Neck: Neck supple. Trachea midline. Cardiovascular: Normal rate, regular rhythm and intact distal pulses. No M/R/G Pulmonary/chest: Effort normal and breath sounds normal. No wheezing, rales or rhonchi. Abdominal: Soft, nontender, scaphoid, nondistended. Bowel sounds active throughout. There are no masses palpable. No hepatosplenomegaly. Extremities: no clubbing, cyanosis, or edema Lymphadenopathy: No cervical adenopathy noted. Neurological: Alert and oriented to person place and time. Skin:  Skin is warm and dry. No rashes noted. Psychiatric: Normal mood and affect. Behavior is normal.  CBC    Component Value Date/Time   WBC 10.5 04/09/2011 0935   RBC 4.01 04/09/2011 0935   HGB 11.6* 04/09/2011 0935   HCT 36.5 04/09/2011 0935   PLT 231 04/09/2011 0935   MCV 91.0 04/09/2011 0935   MCH 28.9 04/09/2011 0935   MCHC 31.8 04/09/2011 0935   RDW 13.9 04/09/2011 0935   LYMPHSABS 7.8* 04/09/2011 0935   MONOABS 0.8 04/09/2011 0935   EOSABS 0.4 04/09/2011 0935   BASOSABS 0.0 04/09/2011 0935   CMP     Component Value Date/Time  NA 141 04/09/2011 0935   K 4.0 04/09/2011 0935   CL 105 04/09/2011 0935   CO2 28 04/09/2011 0935   GLUCOSE 87 04/09/2011 0935   BUN 8 04/09/2011 0935   CREATININE 0.82 04/09/2011 0935   CREATININE 0.90 03/25/2011 1113   CALCIUM 9.3 04/09/2011 0935   PROT 6.9 04/09/2011 0935   ALBUMIN 3.8 04/09/2011 0935   AST 12 04/09/2011 0935   ALT 8 04/09/2011 0935   ALKPHOS 31* 04/09/2011 0935   BILITOT 0.8 04/09/2011 0935   GFRNONAA 66* 03/25/2011 1113   GFRAA 77* 03/25/2011 1113       Assessment & Plan:  66 year old female with a past medical history of CAD, CHF, hypertension, personal history of colon polyps, and recent internal hemorrhoidal banding who seen in consultation at the request of Dr. Michaell Cowing for evaluation of colonoscopy.  Also with long-standing dysphagia.  1. History of colorectal polyps/surveillance colonoscopy -- the exact type of polyps removed 8-10 years ago is unknown, though she is very likely due a repeat colonoscopy at this time. We have discussed this test today including the risks and benefits and she is agreeable to proceed. This will be performed with propofol. We will attempt to obtain her previous colonoscopy and pathology records.  2. Esophageal dysphagia/esophageal injury -- the patient's had long-standing issues with swallowing after what she reports to be esophageal injury associated with thoracic surgery. At this time, I feel barium esophagram is the  most appropriate test to evaluate her swallowing. The results of this can be used to determine the need for upper endoscopy for possible dilatation.  She is agreeable with this plan the

## 2011-04-23 ENCOUNTER — Telehealth: Payer: Self-pay | Admitting: Internal Medicine

## 2011-04-23 NOTE — Telephone Encounter (Signed)
Spoke with pt and informed of all abnormal lab results.  She has barium evaluation and upcoming colonoscopy with Dr. Rhea Belton.  With difficulty swallowing,  May need to give alternative form of iron.  Discussed  Thyroid studies.  Free levels are normal  Counseled to keep appt with me in  May.

## 2011-04-29 ENCOUNTER — Ambulatory Visit (HOSPITAL_COMMUNITY)
Admission: RE | Admit: 2011-04-29 | Discharge: 2011-04-29 | Disposition: A | Discharge status: Home / Self Care | Payer: 59 | Source: Ambulatory Visit | Attending: Internal Medicine | Admitting: Internal Medicine

## 2011-04-29 DIAGNOSIS — S27819A Unspecified injury of esophagus (thoracic part), initial encounter: Secondary | ICD-10-CM

## 2011-04-29 DIAGNOSIS — K449 Diaphragmatic hernia without obstruction or gangrene: Secondary | ICD-10-CM | POA: Insufficient documentation

## 2011-04-29 DIAGNOSIS — K222 Esophageal obstruction: Secondary | ICD-10-CM | POA: Insufficient documentation

## 2011-04-29 DIAGNOSIS — R1310 Dysphagia, unspecified: Secondary | ICD-10-CM | POA: Insufficient documentation

## 2011-04-29 DIAGNOSIS — K219 Gastro-esophageal reflux disease without esophagitis: Secondary | ICD-10-CM | POA: Insufficient documentation

## 2011-05-20 ENCOUNTER — Encounter: Payer: Self-pay | Admitting: Internal Medicine

## 2011-05-20 ENCOUNTER — Ambulatory Visit (HOSPITAL_BASED_OUTPATIENT_CLINIC_OR_DEPARTMENT_OTHER): Payer: 59

## 2011-05-20 ENCOUNTER — Ambulatory Visit (AMBULATORY_SURGERY_CENTER): Payer: 59 | Admitting: Internal Medicine

## 2011-05-20 VITALS — BP 170/87 | HR 65 | Temp 95.7°F | Resp 24 | Ht 64.0 in | Wt 85.0 lb

## 2011-05-20 DIAGNOSIS — D126 Benign neoplasm of colon, unspecified: Secondary | ICD-10-CM

## 2011-05-20 DIAGNOSIS — Z860100 Personal history of colon polyps, unspecified: Secondary | ICD-10-CM

## 2011-05-20 MED ORDER — SODIUM CHLORIDE 0.9 % IV SOLN: 500.0000 mL | INTRAVENOUS | Status: DC

## 2011-05-20 NOTE — Patient Instructions (Signed)

## 2011-05-20 NOTE — Progress Notes (Signed)
Patient did not experience any of the following events: a burn prior to discharge; a fall within the facility; wrong site/side/patient/procedure/implant event; or a hospital transfer or hospital admission upon discharge from the facility. (G8907) Patient did not have preoperative order for IV antibiotic SSI prophylaxis. (G8918)  

## 2011-05-20 NOTE — Progress Notes (Signed)
Propofol was administered by Marrion Coy, CRNA.  Andrey Farmer, CRNA was in the procedure room to observe Marrion Coy, CRNA. Maw

## 2011-05-20 NOTE — Progress Notes (Signed)
Intra procedure Andrey Farmer, CRNA hung a second bag of normal saline 0.9% . Maw

## 2011-05-20 NOTE — Op Note (Signed)
[facility] [address] [place], [place]  [address]  COLONOSCOPY PROCEDURE REPORT  PATIENT:  Tonya, Harper  MR#:  [id number] BIRTHDATE:  21-Aug-1945, 66 yrs. old  GENDER:  female ENDOSCOPIST:  Tonya Caddy. Camrynn Mcclintic, MD REF. BY:  Raechel Chute, M.D. PROCEDURE DATE:  05/20/2011 PROCEDURE:  Colonoscopy with snare polypectomy, Colon with cold biopsy polypectomy ASA CLASS:  Class II INDICATIONS:  surveillance and high-risk screening, personal history of colon polyps MEDICATIONS:   MAC sedation, administered by CRNA, propofol (Diprivan) 300 mg IV  DESCRIPTION OF PROCEDURE:   After the risks benefits and alternatives of the procedure were thoroughly explained, informed consent was obtained.  Digital rectal exam was performed and revealed no rectal masses.   The LB [id number]-[id number] endoscope was introduced through the anus and advanced to the cecum, which was identified by both the appendix and ileocecal valve, without limitations.  The quality of the prep was good, using MoviPrep. The instrument was then slowly withdrawn as the colon was fully examined. <<PROCEDUREIMAGES>>  FINDINGS:  Five sessile polyps, 4 - 7 mm were found in the ascending colon. Polyps were snared without cautery. Retrieval was successful. Three sessile polyps, 3 - 6 mm were found in the transverse colon. Polyps were snared without cautery. Retrieval was successful.  The 3 mm polyp was removed using cold biopsy forceps.  Two sessile 5 - 7 mm polyps were found in the descending colon. Polyps were snared without cautery. Retrieval was successful.  A 6 mm pedunculated polyp was found in the sigmoid colon. Polyp was snared, then cauterized with monopolar cautery. Retrieval was successful. Retroflexed views in the rectum revealed no abnormalities. The scope was then withdrawn from the cecum and the procedure completed.  COMPLICATIONS:  None  ENDOSCOPIC IMPRESSION: 1) Five polyps in the ascending colon.  Removed and sent to pathology. 2) Three polyps in the transverse colon. Removed and sent to pathology. 3) Two polyps in the descending colon. Removed and sent to pathology. 4) Pedunculated polyp in the sigmoid colon. Removed and sent to pathology.  RECOMMENDATIONS: 1) Hold aspirin, aspirin products, and anti-inflammatory medication for 1 week. 2) Await pathology results 3) Repeat Colonoscopy in 1 year given number of polyps removed today. 4) You will receive a letter within 1-2 weeks with the results of your biopsy as well as final recommendations. Please call my office if you have not received a letter after 3 weeks.  Tonya Caddy. Rhea Belton, MD  CC:  Raechel Chute, MD The Patient  n. eSIGNEDCarie Caddy. Lance Huaracha at 05/20/2011 12:19 PM  Tonya Harper, Tonya Harper, [phone]

## 2011-05-20 NOTE — Progress Notes (Signed)
The ptt erated the colonoscopy very well. Maw

## 2011-05-21 ENCOUNTER — Telehealth: Payer: Self-pay

## 2011-05-21 NOTE — Telephone Encounter (Signed)
  Follow up Call-  Call back number 05/20/2011  Post procedure Call Back phone  # [phone]  Permission to leave phone message Yes     Patient questions:  Do you have a fever, pain , or abdominal swelling? no Pain Score  0 *  Have you tolerated food without any problems? yes  Have you been able to return to your normal activities? yes  Do you have any questions about your discharge instructions: Diet   no Medications  no Follow up visit  no  Do you have questions or concerns about your Care? no  Actions: * If pain score is 4 or above: No action needed, pain <4.  Per the pt , "no problems". Maw

## 2011-05-23 ENCOUNTER — Encounter: Payer: Self-pay | Admitting: Internal Medicine

## 2011-05-23 DIAGNOSIS — K222 Esophageal obstruction: Secondary | ICD-10-CM | POA: Insufficient documentation

## 2011-05-24 ENCOUNTER — Encounter: Payer: Self-pay | Admitting: Internal Medicine

## 2011-05-27 ENCOUNTER — Ambulatory Visit (HOSPITAL_BASED_OUTPATIENT_CLINIC_OR_DEPARTMENT_OTHER)
Admission: RE | Admit: 2011-05-27 | Discharge: 2011-05-27 | Disposition: A | Discharge status: Home / Self Care | Payer: 59 | Source: Ambulatory Visit | Attending: Internal Medicine | Admitting: Internal Medicine

## 2011-05-27 ENCOUNTER — Telehealth: Payer: Self-pay | Admitting: Internal Medicine

## 2011-05-27 DIAGNOSIS — Z1231 Encounter for screening mammogram for malignant neoplasm of breast: Secondary | ICD-10-CM | POA: Insufficient documentation

## 2011-05-27 NOTE — Telephone Encounter (Signed)
Pt came by at 930am stating she needs a 90 day prescription  of Albuterol Sulfate (Aero Soln) PROVENTIL HFA;VENTOLIN HFA 108 (90 BASE) MCG/ACT Inhale 1-2 puffs into the lungs every 6 (six) hours as needed for wheezing;   She states she needs it as a 90 day prescription and call in to  [facility] [address] in [place]. She states her insurance will not pay for it not other way.   Please call pt if you have any questions or concerns. [phone]

## 2011-05-28 ENCOUNTER — Telehealth: Payer: Self-pay | Admitting: Internal Medicine

## 2011-05-28 ENCOUNTER — Other Ambulatory Visit: Payer: Self-pay | Admitting: Critical Care Medicine

## 2011-05-28 DIAGNOSIS — J449 Chronic obstructive pulmonary disease, unspecified: Secondary | ICD-10-CM

## 2011-05-28 DIAGNOSIS — J40 Bronchitis, not specified as acute or chronic: Secondary | ICD-10-CM

## 2011-05-28 MED ORDER — BUDESONIDE-FORMOTEROL FUMARATE 160-4.5 MCG/ACT IN AERO: 2.0000 | INHALATION_SPRAY | Freq: Two times a day (BID) | RESPIRATORY_TRACT | Status: DC

## 2011-05-28 NOTE — Telephone Encounter (Signed)
Called [facility]- was told 3 month supply is required by Medicare- is actually 84 days, rx given, copay $65 is cheapest to patient. Called patient left message prescription was called in.

## 2011-05-28 NOTE — Telephone Encounter (Signed)
Spoke with pt.  Ok for 3 month supply of albuterol  Counseled pt to see me in office on Thursday

## 2011-05-28 NOTE — Telephone Encounter (Signed)
Tonya Harper call [facility] on [address] about this refill  She can have a 3 month supply of her albuteril  2 inhations TID for SOB.  However Medicare will pay for this is OK for 3 months

## 2011-05-30 ENCOUNTER — Encounter: Payer: Self-pay | Admitting: Internal Medicine

## 2011-05-30 ENCOUNTER — Ambulatory Visit (INDEPENDENT_AMBULATORY_CARE_PROVIDER_SITE_OTHER): Payer: 59 | Admitting: Internal Medicine

## 2011-05-30 VITALS — BP 138/70 | HR 65 | Temp 97.2°F | Resp 16 | Ht 64.0 in | Wt 87.0 lb

## 2011-05-30 DIAGNOSIS — E049 Nontoxic goiter, unspecified: Secondary | ICD-10-CM

## 2011-05-30 DIAGNOSIS — R946 Abnormal results of thyroid function studies: Secondary | ICD-10-CM

## 2011-05-30 DIAGNOSIS — J449 Chronic obstructive pulmonary disease, unspecified: Secondary | ICD-10-CM

## 2011-05-30 DIAGNOSIS — E010 Iodine-deficiency related diffuse (endemic) goiter: Secondary | ICD-10-CM

## 2011-05-30 DIAGNOSIS — K222 Esophageal obstruction: Secondary | ICD-10-CM

## 2011-05-30 DIAGNOSIS — R634 Abnormal weight loss: Secondary | ICD-10-CM

## 2011-05-30 LAB — TSH: TSH: 0.341 u[IU]/mL — ABNORMAL LOW (ref 0.350–4.500)

## 2011-05-30 LAB — T4, FREE: Free T4: 1 ng/dL (ref 0.80–1.80)

## 2011-05-30 LAB — T3, FREE: T3, Free: 3.8 pg/mL (ref 2.3–4.2)

## 2011-05-30 NOTE — Patient Instructions (Signed)
To have nuclear thyroid study  Keep CPE appt with me

## 2011-05-30 NOTE — Progress Notes (Signed)
Subjective:    Patient ID: Tonya Harper, female    DOB: 1945/07/06, 66 y.o.   MRN: [id number]  HPI Tonya Harper is here for follow up.  She report she had 12 polyps removed by Dr. Russella Harper.  She will need a repeat in one year.  See Esophagram  Shows esophageal stricture.  She reports she discussed with Dr. Russella Harper possiblityof dilation in hospital  See Thyroid labs.  Has chronic weight loss and rare palpitations  Ran out of Albuterol but breathing now better since she has been using the last 48 hours.  Using rescue inhaler twice a day since pollen outbreak  Allergies  Allergen Reactions  . Aspirin     nausea and dizziness after taking for one month at a time.. States her physician told her to use the 81 mg vs the 325mg .  . Shellfish Allergy     Medication withdrawal symptoms   Past Medical History  Diagnosis Date  . CAD   . Chronic systolic heart failure   . COPD   . Ischemic cardiomyopathy   . Anemia   . Hypertension   . Urge incontinence of urine   . CHF (congestive heart failure)   . Hearing loss   . Foot swelling   . Rectal bleeding   . Personal history of colonic polyps 02/27/2011  . Hemorrhoids, Right posterior, internal, with prolapse & bleeding 02/27/2011   Past Surgical History  Procedure Date  . Tumor removed   . Abdominal hysterectomy 1976  . Band hemorrhoidectomy   . Appendectomy    History   Social History  . Marital Status: Married    Spouse Name: N/A    Number of Children: 2  . Years of Education: N/A   Occupational History  . unemployed     Retired   Social History Main Topics  . Smoking status: Former Smoker -- 0.5 packs/day for 40 years    Types: Cigarettes    Quit date: 02/14/2011  . Smokeless tobacco: Never Used  . Alcohol Use: Yes     socially once or twice per year  . Drug Use: No  . Sexually Active: Yes    Birth Control/ Protection: Surgical   Other Topics Concern  . Not on file   Social History Narrative  . No narrative on file   Family  History  Problem Relation Age of Onset  . Heart attack Father   . Early death Father 38  . Stroke Father   . Heart disease Father   . Coronary artery disease Brother   . Heart disease Brother     MI @ 20  . Cancer Brother     prostate  . Kidney disease Mother   . Heart disease Sister     MI @ 56  . Heart disease Brother     MI in 18s  . Cancer Brother     prostate   Patient Active Problem List  Diagnoses  . PURE HYPERCHOLESTEROLEMIA  . HYPOKALEMIA  . OTHER SPECIFIED ANEMIAS  . TOBACCO ABUSE  . CAD  . CARDIOMYOPATHY  . CHRONIC SYSTOLIC HEART FAILURE  . COPD  . OTHER SPEC FORMS CHRONIC ISCHEMIC HEART DISEASE  . Bruit  . Hemorrhoids, internal, with prolapse & bleeding  . Personal history of colonic polyps, last colonoscopy 1996 (x6 then)  . Weight loss  . Esophageal injury  . Esophageal stricture   Current Outpatient Prescriptions on File Prior to Visit  Medication Sig Dispense Refill  . acetaminophen (TYLENOL) 325  MG tablet Take 650 mg by mouth every 6 (six) hours as needed. For pain      . albuterol (PROVENTIL HFA;VENTOLIN HFA) 108 (90 BASE) MCG/ACT inhaler Inhale 1-2 puffs into the lungs every 6 (six) hours as needed for wheezing.  1 Inhaler  6  . budesonide-formoterol (SYMBICORT) 160-4.5 MCG/ACT inhaler Inhale 2 puffs into the lungs 2 (two) times daily.  3 Inhaler  1  . carvedilol (COREG) 12.5 MG tablet Take 1 tablet (12.5 mg total) by mouth 2 (two) times daily with a meal.  180 tablet  3  . cloNIDine (CATAPRES) 0.1 MG tablet Take one tablet each evening  90 tablet  3  . hydrocortisone-pramoxine (ANALPRAM-HC) 2.5-1 % rectal cream Place 1 applicator rectally 4 (four) times daily as needed.      Marland Kitchen lisinopril (PRINIVIL,ZESTRIL) 40 MG tablet Take 40 mg by mouth daily.        . nitroGLYCERIN (NITROSTAT) 0.4 MG SL tablet Place 0.4 mg under the tongue every 5 (five) minutes as needed. For chest pain      . Oxybutynin (GELNIQUE) 3 (28) % (MG/ACT) GEL Place onto the skin daily.         . pravastatin (PRAVACHOL) 20 MG tablet TAKE ONE TABLET BY MOUTH DAILY AT BEDTIME  90 tablet  3      Review of Systems See HPI    Objective:   Physical Exam  Physical Exam  Nursing note and vitals reviewed.  Peak flow poor effort Constitutional: She is oriented to person, place, and time. She appears well-developed and well-nourished.  HENT:  Head: Normocephalic and atraumatic.  Neck  Slight thyromegaly.   Cardiovascular: Normal rate and regular rhythm. Exam reveals no gallop and no friction rub.  No murmur heard.  Pulmonary/Chest: Breath sounds normal. She has no wheezes. She has no rales. Decreased BS both bases Neurological: She is alert and oriented to person, place, and time.  Skin: Skin is warm and dry.  Psychiatric: She has a normal mood and affect. Her behavior is normal.         Assessment & Plan:  1)  Thyromegaly:  Will get nuclear scan looing for autonomous functioning nodule.  Repeat freee levels today 2)  Wt loss may be mutlifactorial  Due to dyspagia from stricture - possible dialtion pending, thyroid work up pending.  Mammogram report pending.   3)  Multiple polyposis  Need repeat colonoscopy in 1 year 4)  COPD   At baseline today

## 2011-06-05 ENCOUNTER — Encounter (HOSPITAL_COMMUNITY)
Admission: RE | Admit: 2011-06-05 | Discharge: 2011-06-05 | Disposition: A | Discharge status: Home / Self Care | Payer: 59 | Source: Ambulatory Visit | Attending: Internal Medicine | Admitting: Internal Medicine

## 2011-06-05 DIAGNOSIS — E010 Iodine-deficiency related diffuse (endemic) goiter: Secondary | ICD-10-CM

## 2011-06-05 MED ORDER — SODIUM IODIDE I 131 CAPSULE
9.4000 | Freq: Once | INTRAVENOUS | Status: AC | PRN
  Administered 2011-06-05: 9.4 via ORAL

## 2011-06-06 ENCOUNTER — Other Ambulatory Visit: Payer: Self-pay | Admitting: Cardiology

## 2011-06-06 ENCOUNTER — Ambulatory Visit (HOSPITAL_COMMUNITY)
Admission: RE | Admit: 2011-06-06 | Discharge: 2011-06-06 | Disposition: A | Discharge status: Home / Self Care | Payer: 59 | Source: Ambulatory Visit | Attending: Internal Medicine | Admitting: Internal Medicine

## 2011-06-06 DIAGNOSIS — R946 Abnormal results of thyroid function studies: Secondary | ICD-10-CM | POA: Insufficient documentation

## 2011-06-06 DIAGNOSIS — E042 Nontoxic multinodular goiter: Secondary | ICD-10-CM | POA: Insufficient documentation

## 2011-06-06 DIAGNOSIS — R1310 Dysphagia, unspecified: Secondary | ICD-10-CM | POA: Insufficient documentation

## 2011-06-06 MED ORDER — SODIUM IODIDE I 131 CAPSULE
9.4000 | Freq: Once | INTRAVENOUS | Status: AC | PRN
  Administered 2011-06-05: 9.4 via ORAL

## 2011-06-06 MED ORDER — SODIUM PERTECHNETATE TC 99M INJECTION
10.0000 | Freq: Once | INTRAVENOUS | Status: AC | PRN
  Administered 2011-06-06: 10 via INTRAVENOUS

## 2011-06-10 ENCOUNTER — Encounter: Payer: Self-pay | Admitting: Critical Care Medicine

## 2011-06-10 ENCOUNTER — Ambulatory Visit (INDEPENDENT_AMBULATORY_CARE_PROVIDER_SITE_OTHER): Payer: 59 | Admitting: Critical Care Medicine

## 2011-06-10 VITALS — BP 150/58 | HR 60 | Temp 98.0°F | Ht 64.0 in | Wt 85.5 lb

## 2011-06-10 DIAGNOSIS — J449 Chronic obstructive pulmonary disease, unspecified: Secondary | ICD-10-CM

## 2011-06-10 NOTE — Patient Instructions (Signed)
No change in medications. Return in        6 months        

## 2011-06-10 NOTE — Progress Notes (Signed)
Subjective:    Patient ID: Tonya Harper, female    DOB: [birth year], 66 y.o.   MRN: [id number]  HPI 66 y.o. WF gold C. COPD  06/10/2011 No smoking. Doing better. Not much mucus.  Dyspnea is better.  On symbicort daily.  No recent ED visits.   Pt denies any significant sore throat, nasal congestion or excess secretions, fever, chills, sweats, unintended weight loss, pleurtic or exertional chest pain, orthopnea PND, or leg swelling Pt denies any increase in rescue therapy over baseline, denies waking up needing it or having any early am or nocturnal exacerbations of coughing/wheezing/or dyspnea. Pt also denies any obvious fluctuation in symptoms with  weather or environmental change or other alleviating or aggravating factors     Past Medical History  Diagnosis Date  . CAD   . Chronic systolic heart failure   . COPD   . Ischemic cardiomyopathy   . Anemia   . Hypertension   . Urge incontinence of urine   . CHF (congestive heart failure)   . Hearing loss   . Foot swelling   . Rectal bleeding   . Personal history of colonic polyps 02/27/2011  . Hemorrhoids, Right posterior, internal, with prolapse & bleeding 02/27/2011     Family History  Problem Relation Age of Onset  . Heart attack Father   . Early death Father 69  . Stroke Father   . Heart disease Father   . Coronary artery disease Brother   . Heart disease Brother     MI @ 31  . Cancer Brother     prostate  . Kidney disease Mother   . Heart disease Sister     MI @ 56  . Heart disease Brother     MI in 26s  . Cancer Brother     prostate     History   Social History  . Marital Status: Married    Spouse Name: N/A    Number of Children: 2  . Years of Education: N/A   Occupational History  . unemployed     Retired   Social History Main Topics  . Smoking status: Former Smoker -- 0.5 packs/day for 40 years    Types: Cigarettes    Quit date: 02/14/2011  . Smokeless tobacco: Never Used  . Alcohol Use: Yes   socially once or twice per year  . Drug Use: No  . Sexually Active: Yes    Birth Control/ Protection: Surgical   Other Topics Concern  . Not on file   Social History Narrative  . No narrative on file     Allergies  Allergen Reactions  . Aspirin     nausea and dizziness after taking for one month at a time.. States her physician told her to use the 81 mg vs the 325mg .  . Shellfish Allergy     Medication withdrawal symptoms     Outpatient Prescriptions Prior to Visit  Medication Sig Dispense Refill  . acetaminophen (TYLENOL) 325 MG tablet Take 650 mg by mouth every 6 (six) hours as needed. For pain      . albuterol (PROVENTIL HFA;VENTOLIN HFA) 108 (90 BASE) MCG/ACT inhaler Inhale 1-2 puffs into the lungs every 6 (six) hours as needed for wheezing.  1 Inhaler  6  . budesonide-formoterol (SYMBICORT) 160-4.5 MCG/ACT inhaler Inhale 2 puffs into the lungs 2 (two) times daily.  3 Inhaler  1  . carvedilol (COREG) 12.5 MG tablet Take 1 tablet (12.5 mg total) by mouth 2 (two)  times daily with a meal.  180 tablet  3  . cloNIDine (CATAPRES) 0.1 MG tablet Take one tablet each evening  90 tablet  3  . hydrocortisone-pramoxine (ANALPRAM-HC) 2.5-1 % rectal cream Place 1 applicator rectally 4 (four) times daily as needed.      Marland Kitchen lisinopril (PRINIVIL,ZESTRIL) 40 MG tablet TAKE ONE TABLET BY MOUTH DAILY  90 tablet  1  . nitroGLYCERIN (NITROSTAT) 0.4 MG SL tablet Place 0.4 mg under the tongue every 5 (five) minutes as needed. For chest pain      . Oxybutynin (GELNIQUE) 3 (28) % (MG/ACT) GEL Place onto the skin daily.        . pravastatin (PRAVACHOL) 20 MG tablet TAKE ONE TABLET BY MOUTH DAILY AT BEDTIME  90 tablet  3     Review of Systems  Constitutional: Negative for chills, diaphoresis, activity change, appetite change, fatigue and unexpected weight change.  HENT: Positive for trouble swallowing. Negative for hearing loss, nosebleeds, congestion, facial swelling, sneezing, mouth sores, neck  stiffness, dental problem, voice change, postnasal drip, sinus pressure, tinnitus and ear discharge.   Eyes: Negative for photophobia, discharge, redness, itching and visual disturbance.  Respiratory: Positive for shortness of breath. Negative for apnea, cough, choking, chest tightness and stridor.   Cardiovascular: Negative for palpitations.  Gastrointestinal: Negative for nausea, constipation, blood in stool and abdominal distention.  Genitourinary: Negative for dysuria, urgency, frequency, hematuria, flank pain, decreased urine volume and difficulty urinating.  Musculoskeletal: Negative for myalgias, back pain, joint swelling, arthralgias and gait problem.  Skin: Negative for color change and pallor.  Neurological: Negative for dizziness, tremors, seizures, syncope, speech difficulty, weakness, light-headedness and numbness.  Hematological: Negative for adenopathy. Does not bruise/bleed easily.  Psychiatric/Behavioral: Negative for confusion, sleep disturbance, dysphoric mood and agitation. The patient is not nervous/anxious.        Objective:   Physical Exam  Filed Vitals:   06/10/11 0929  BP: 150/58  Pulse: 60  Temp: 98 F (36.7 C)  TempSrc: Oral  Height: 5\' 4"  (1.626 m)  Weight: 85 lb 8 oz (38.783 kg)  SpO2: 95%    Gen: Pleasant, thin, in no distress,  normal affect  ENT: No lesions,  mouth clear,  oropharynx clear, no postnasal drip  Neck: No JVD, no TMG, no carotid bruits  Lungs: No use of accessory muscles, no dullness to percussion,distant BS without rales or rhonchi  Cardiovascular: RRR, heart sounds normal, no murmur or gallops, no peripheral edema  Abdomen: soft and NT, no HSM,  BS normal  Musculoskeletal: No deformities, no cyanosis or clubbing  Neuro: alert, non focal  Skin: Warm, no lesions or rashes          Assessment & Plan:   COPD Chronic obstructive lung disease golds stage C. stable at this time Patient has successfully quit  smoking Plan Maintain Symbicort twice daily Use short acting bronchodilator as needed Return 6 months     Updated Medication List Outpatient Encounter Prescriptions as of 06/10/2011  Medication Sig Dispense Refill  . acetaminophen (TYLENOL) 325 MG tablet Take 650 mg by mouth every 6 (six) hours as needed. For pain      . albuterol (PROVENTIL HFA;VENTOLIN HFA) 108 (90 BASE) MCG/ACT inhaler Inhale 1-2 puffs into the lungs every 6 (six) hours as needed for wheezing.  1 Inhaler  6  . budesonide-formoterol (SYMBICORT) 160-4.5 MCG/ACT inhaler Inhale 2 puffs into the lungs 2 (two) times daily.  3 Inhaler  1  . carvedilol (COREG) 12.5 MG  tablet Take 1 tablet (12.5 mg total) by mouth 2 (two) times daily with a meal.  180 tablet  3  . cloNIDine (CATAPRES) 0.1 MG tablet Take one tablet each evening  90 tablet  3  . hydrocortisone-pramoxine (ANALPRAM-HC) 2.5-1 % rectal cream Place 1 applicator rectally 4 (four) times daily as needed.      Marland Kitchen lisinopril (PRINIVIL,ZESTRIL) 40 MG tablet TAKE ONE TABLET BY MOUTH DAILY  90 tablet  1  . nitroGLYCERIN (NITROSTAT) 0.4 MG SL tablet Place 0.4 mg under the tongue every 5 (five) minutes as needed. For chest pain      . Oxybutynin (GELNIQUE) 3 (28) % (MG/ACT) GEL Place onto the skin daily.        . pravastatin (PRAVACHOL) 20 MG tablet TAKE ONE TABLET BY MOUTH DAILY AT BEDTIME  90 tablet  3

## 2011-06-10 NOTE — Assessment & Plan Note (Signed)
Chronic obstructive lung disease golds stage C. stable at this time Patient has successfully quit smoking Plan Maintain Symbicort twice daily Use short acting bronchodilator as needed Return 6 months

## 2011-06-11 ENCOUNTER — Encounter: Payer: Self-pay | Admitting: Internal Medicine

## 2011-06-11 ENCOUNTER — Ambulatory Visit (HOSPITAL_BASED_OUTPATIENT_CLINIC_OR_DEPARTMENT_OTHER)
Admission: RE | Admit: 2011-06-11 | Discharge: 2011-06-11 | Disposition: A | Discharge status: Home / Self Care | Payer: 59 | Source: Ambulatory Visit | Attending: Internal Medicine | Admitting: Internal Medicine

## 2011-06-11 ENCOUNTER — Ambulatory Visit (INDEPENDENT_AMBULATORY_CARE_PROVIDER_SITE_OTHER): Payer: 59 | Admitting: Internal Medicine

## 2011-06-11 VITALS — BP 138/74 | HR 63 | Temp 97.4°F | Ht 63.5 in | Wt 84.2 lb

## 2011-06-11 DIAGNOSIS — K222 Esophageal obstruction: Secondary | ICD-10-CM

## 2011-06-11 DIAGNOSIS — R634 Abnormal weight loss: Secondary | ICD-10-CM

## 2011-06-11 DIAGNOSIS — E041 Nontoxic single thyroid nodule: Secondary | ICD-10-CM | POA: Insufficient documentation

## 2011-06-11 DIAGNOSIS — E7800 Pure hypercholesterolemia, unspecified: Secondary | ICD-10-CM

## 2011-06-11 DIAGNOSIS — N390 Urinary tract infection, site not specified: Secondary | ICD-10-CM

## 2011-06-11 DIAGNOSIS — Z0000 Encounter for general adult medical examination without abnormal findings: Secondary | ICD-10-CM

## 2011-06-11 DIAGNOSIS — Z23 Encounter for immunization: Secondary | ICD-10-CM

## 2011-06-11 DIAGNOSIS — J449 Chronic obstructive pulmonary disease, unspecified: Secondary | ICD-10-CM

## 2011-06-11 DIAGNOSIS — Z124 Encounter for screening for malignant neoplasm of cervix: Secondary | ICD-10-CM

## 2011-06-11 DIAGNOSIS — E049 Nontoxic goiter, unspecified: Secondary | ICD-10-CM | POA: Insufficient documentation

## 2011-06-11 DIAGNOSIS — Z1151 Encounter for screening for human papillomavirus (HPV): Secondary | ICD-10-CM

## 2011-06-11 DIAGNOSIS — Z86010 Personal history of colonic polyps: Secondary | ICD-10-CM

## 2011-06-11 LAB — POCT URINALYSIS DIPSTICK
Blood, UA: NEGATIVE
Glucose, UA: NEGATIVE
Ketones, UA: NEGATIVE
Nitrite, UA: NEGATIVE
Protein, UA: NEGATIVE
Spec Grav, UA: >=1.03
Urobilinogen, UA: 0.2
pH, UA: 6

## 2011-06-11 MED ORDER — TETANUS-DIPHTH-ACELL PERTUSSIS 5-2.5-18.5 LF-MCG/0.5 IM SUSP
0.5000 mL | Freq: Once | INTRAMUSCULAR | Status: AC
  Administered 2011-06-11: 0.5 mL via INTRAMUSCULAR

## 2011-06-11 MED ORDER — PNEUMOCOCCAL VAC POLYVALENT 25 MCG/0.5ML IJ INJ
0.5000 mL | INJECTION | INTRAMUSCULAR | Status: AC
  Administered 2011-06-11: 0.5 mL via INTRAMUSCULAR

## 2011-06-11 NOTE — Progress Notes (Signed)
Subjective:    Patient ID: Tonya Harper, female    DOB: [birth year], 66 y.o.   MRN: [id number]  HPI  Tonya Harper is here for CPe.  Overall doing well breathing at baseline.   She remains in remission from tobacco dependence   She is underweight and likely multifactorial including in part esophageal stricture and hot nodule seen on nuclear thyroid scan.   Free thyroid levels are normal and she has subclinical hyperthyroidism.  No report of palpitations or tremor  Mammogram is normal. Last CXR neg for masses.  She has not heard back from Dr. Sherald Barge' office regarding esophageal stricture and possible dilation.  She has not had a pap in quite some time and has been advised to have yearly colonscopy's due to numerous polyps found on recent examt.  Over 10 polyps were removed     Allergies  Allergen Reactions  . Aspirin     nausea and dizziness after taking for one month at a time.. States her physician told her to use the 81 mg vs the 325mg .  . Shellfish Allergy     Medication withdrawal symptoms   Past Medical History  Diagnosis Date  . CAD   . Chronic systolic heart failure   . COPD   . Ischemic cardiomyopathy   . Anemia   . Hypertension   . Urge incontinence of urine   . CHF (congestive heart failure)   . Hearing loss   . Foot swelling   . Rectal bleeding   . Personal history of colonic polyps 02/27/2011  . Hemorrhoids, Right posterior, internal, with prolapse & bleeding 02/27/2011   Past Surgical History  Procedure Date  . Tumor removed   . Abdominal hysterectomy 1976  . Band hemorrhoidectomy   . Appendectomy    History   Social History  . Marital Status: Married    Spouse Name: N/A    Number of Children: 2  . Years of Education: N/A   Occupational History  . unemployed     Retired   Social History Main Topics  . Smoking status: Former Smoker -- 0.5 packs/day for 40 years    Types: Cigarettes    Quit date: 02/14/2011  . Smokeless tobacco: Never Used  . Alcohol  Use: Yes     socially once or twice per year  . Drug Use: No  . Sexually Active: Yes    Birth Control/ Protection: Surgical   Other Topics Concern  . Not on file   Social History Narrative  . No narrative on file   Family History  Problem Relation Age of Onset  . Heart attack Father   . Early death Father 3  . Stroke Father   . Heart disease Father   . Coronary artery disease Brother   . Heart disease Brother     MI @ 82  . Cancer Brother     prostate  . Kidney disease Mother   . Heart disease Sister     MI @ 39  . Heart disease Brother     MI in 95s  . Cancer Brother     prostate   Patient Active Problem List  Diagnoses  . PURE HYPERCHOLESTEROLEMIA  . HYPOKALEMIA  . OTHER SPECIFIED ANEMIAS  . TOBACCO ABUSE  . CAD  . CARDIOMYOPATHY  . CHRONIC SYSTOLIC HEART FAILURE  . COPD  . OTHER SPEC FORMS CHRONIC ISCHEMIC HEART DISEASE  . Bruit  . Hemorrhoids, internal, with prolapse & bleeding  . Personal history of colonic  polyps, last colonoscopy 1996 (x6 then)  . Weight loss  . Esophageal injury  . Esophageal stricture   Current Outpatient Prescriptions on File Prior to Visit  Medication Sig Dispense Refill  . acetaminophen (TYLENOL) 325 MG tablet Take 650 mg by mouth every 6 (six) hours as needed. For pain      . albuterol (PROVENTIL HFA;VENTOLIN HFA) 108 (90 BASE) MCG/ACT inhaler Inhale 1-2 puffs into the lungs every 6 (six) hours as needed for wheezing.  1 Inhaler  6  . budesonide-formoterol (SYMBICORT) 160-4.5 MCG/ACT inhaler Inhale 2 puffs into the lungs 2 (two) times daily.  3 Inhaler  1  . carvedilol (COREG) 12.5 MG tablet Take 1 tablet (12.5 mg total) by mouth 2 (two) times daily with a meal.  180 tablet  3  . cloNIDine (CATAPRES) 0.1 MG tablet Take one tablet each evening  90 tablet  3  . hydrocortisone-pramoxine (ANALPRAM-HC) 2.5-1 % rectal cream Place 1 applicator rectally 4 (four) times daily as needed.      Marland Kitchen lisinopril (PRINIVIL,ZESTRIL) 40 MG tablet  TAKE ONE TABLET BY MOUTH DAILY  90 tablet  1  . nitroGLYCERIN (NITROSTAT) 0.4 MG SL tablet Place 0.4 mg under the tongue every 5 (five) minutes as needed. For chest pain      . Oxybutynin (GELNIQUE) 3 (28) % (MG/ACT) GEL Place onto the skin daily.        . pravastatin (PRAVACHOL) 20 MG tablet TAKE ONE TABLET BY MOUTH DAILY AT BEDTIME  90 tablet  3      Review of Systems  Constitutional: Positive for appetite change.  HENT: Negative.   Eyes: Negative.   Respiratory: Negative.   Cardiovascular: Negative.   Gastrointestinal: Negative.   Genitourinary: Negative.   Musculoskeletal: Negative.   Neurological: Negative.   Hematological: Negative.   Psychiatric/Behavioral: Negative.        Objective:   Physical Exam  Physical Exam  Vital signs and nursing note reviewed  Constitutional: She is oriented to person, place, and time. She appears well-developed and well-nourished. She is cooperative.  HENT:  Head: Normocephalic and atraumatic.  Right Ear: Tympanic membrane normal.  Left Ear: Tympanic membrane normal.  Nose: Nose normal.  Mouth/Throat: Oropharynx is clear and moist and mucous membranes are normal. No oropharyngeal exudate or posterior oropharyngeal erythema.  Eyes: Conjunctivae and EOM are normal. Pupils are equal, round, and reactive to light.  Neck: Neck supple. No JVD present. Carotid bruit is not present. No mass and no thyromegaly present.  Cardiovascular: Regular rhythm, normal heart sounds, intact distal pulses and normal pulses.  Exam reveals no gallop and no friction rub.   No murmur heard. Pulses:      Dorsalis pedis pulses are 2+ on the right side, and 2+ on the left side.  Pulmonary/Chest: Breath sounds normal. She has no wheezes. She has no rhonchi. She has no rales. Right breast exhibits no mass, no nipple discharge and no skin change. Left breast exhibits no mass, no nipple discharge and no skin change.  Abdominal: Soft. Bowel sounds are normal. She exhibits  no distension and no mass. There is no hepatosplenomegaly. There is no tenderness. There is no CVA tenderness.  Genitourinary: Rectum normal, vagina normal and uterus normal. Rectal exam shows no mass. Guaiac negative stool. No labial fusion. There is no lesion on the right labia. There is no lesion on the left labia. Cervix exhibits no motion tenderness. Right adnexum displays no mass, no tenderness and no fullness. Left adnexum displays  no mass, no tenderness and no fullness. No erythema around the vagina.  Musculoskeletal:       No active synovitis to any joint.    Lymphadenopathy:       Right cervical: No superficial cervical adenopathy present.      Left cervical: No superficial cervical adenopathy present.       Right axillary: No pectoral and no lateral adenopathy present.       Left axillary: No pectoral and no lateral adenopathy present.      Right: No inguinal adenopathy present.       Left: No inguinal adenopathy present.  Neurological: She is alert and oriented to person, place, and time. She has normal strength and normal reflexes. No cranial nerve deficit or sensory deficit. She displays a negative Romberg sign. Coordination and gait normal.  Skin: Skin is warm and dry. No abrasion, no bruising, no ecchymosis and no rash noted. No cyanosis. Nails show no clubbing.  Psychiatric: She has a normal mood and affect. Her speech is normal and behavior is normal.          Assessment & Plan:   1)  Health Maintenance:  See scanned Hm sheet   Tdap and pneumonia vaccine today 2)  Thyroid nodules/subclinical hyperthyroidism.  She has both a cold and hot nodule on nuclear imaging.  Will refer To Dr. Talmage Nap for further management of hot nodule and get thyroid ultrasound to further evaluate cold nodule. 3)  Weight loss  Likely multifactorial.  Will need evaluation by Dr. Rhea Belton regarding possible esophageal dilation given advanced COPD.  I gave pt office number for Dr. Rhea Belton so she can call to  discuss this matter with MD.  She voices understanding.  Encouraged nutritional supplements with puddings and Ensure or liquid of choice. 4) COPD  Advanced.  clinically stable for now.  Recently has seen Dr. Delford Field 5) Hyperlipidemia 6)  Tobacco dependence  In remission  7)  Cardiomyopathy, CAD 8)  Multiple colonic polyps  Advised yearly colonoscopy for now 9)  Esophageal stricture see above       Assessment & Plan:

## 2011-06-12 ENCOUNTER — Encounter: Payer: Self-pay | Admitting: Internal Medicine

## 2011-06-12 ENCOUNTER — Other Ambulatory Visit: Payer: Self-pay | Admitting: Internal Medicine

## 2011-06-12 ENCOUNTER — Ambulatory Visit (INDEPENDENT_AMBULATORY_CARE_PROVIDER_SITE_OTHER): Payer: 59 | Admitting: Internal Medicine

## 2011-06-12 VITALS — BP 114/55 | HR 67 | Temp 99.4°F | Resp 16

## 2011-06-12 DIAGNOSIS — T7840XA Allergy, unspecified, initial encounter: Secondary | ICD-10-CM

## 2011-06-12 DIAGNOSIS — R229 Localized swelling, mass and lump, unspecified: Secondary | ICD-10-CM

## 2011-06-12 MED ORDER — PREDNISONE 20 MG PO TABS: ORAL_TABLET | ORAL | Status: AC

## 2011-06-12 MED ORDER — VITAMIN D3 1.25 MG (50000 UT) PO CAPS: 1.0000 | ORAL_CAPSULE | ORAL | Status: DC

## 2011-06-12 NOTE — Progress Notes (Addendum)
Subjective:    Patient ID: Tonya Harper, female    DOB: November 10, 1945, 66 y.o.   MRN: [id number]  HPI  Tonya Harper comes back today after noting swelling near injection sites.  She received both a Tdap and pneumovax yeasterday in office.  Reported temp to 100 last evening.  First noted swelling around 6-7 pm last evening  Pain in area of swelling  Allergies  Allergen Reactions  . Aspirin     nausea and dizziness after taking for one month at a time.. States her physician told her to use the 81 mg vs the 325mg .  . Shellfish Allergy     Medication withdrawal symptoms   Past Medical History  Diagnosis Date  . CAD   . Chronic systolic heart failure   . COPD   . Ischemic cardiomyopathy   . Anemia   . Hypertension   . Urge incontinence of urine   . CHF (congestive heart failure)   . Hearing loss   . Foot swelling   . Rectal bleeding   . Personal history of colonic polyps 02/27/2011  . Hemorrhoids, Right posterior, internal, with prolapse & bleeding 02/27/2011   Past Surgical History  Procedure Date  . Tumor removed   . Abdominal hysterectomy 1976  . Band hemorrhoidectomy   . Appendectomy    History   Social History  . Marital Status: Married    Spouse Name: N/A    Number of Children: 2  . Years of Education: N/A   Occupational History  . unemployed     Retired   Social History Main Topics  . Smoking status: Former Smoker -- 0.5 packs/day for 40 years    Types: Cigarettes    Quit date: 02/14/2011  . Smokeless tobacco: Never Used  . Alcohol Use: Yes     socially once or twice per year  . Drug Use: No  . Sexually Active: Yes    Birth Control/ Protection: Surgical   Other Topics Concern  . Not on file   Social History Narrative  . No narrative on file   Family History  Problem Relation Age of Onset  . Heart attack Father   . Early death Father 58  . Stroke Father   . Heart disease Father   . Coronary artery disease Brother   . Heart disease Brother     MI @ 24    . Cancer Brother     prostate  . Kidney disease Mother   . Heart disease Sister     MI @ 12  . Heart disease Brother     MI in 56s  . Cancer Brother     prostate   Patient Active Problem List  Diagnoses  . PURE HYPERCHOLESTEROLEMIA  . HYPOKALEMIA  . OTHER SPECIFIED ANEMIAS  . TOBACCO ABUSE  . CAD  . CARDIOMYOPATHY  . CHRONIC SYSTOLIC HEART FAILURE  . COPD  . OTHER SPEC FORMS CHRONIC ISCHEMIC HEART DISEASE  . Bruit  . Hemorrhoids, internal, with prolapse & bleeding  . Personal history of colonic polyps, last colonoscopy 1996 (x6 then)  . Weight loss  . Esophageal injury  . Esophageal stricture  . Thyroid nodule, cold  . Thyroid nodule, hot   Current Outpatient Prescriptions on File Prior to Visit  Medication Sig Dispense Refill  . acetaminophen (TYLENOL) 325 MG tablet Take 650 mg by mouth every 6 (six) hours as needed. For pain      . albuterol (PROVENTIL HFA;VENTOLIN HFA) 108 (90 BASE) MCG/ACT inhaler  Inhale 1-2 puffs into the lungs every 6 (six) hours as needed for wheezing.  1 Inhaler  6  . budesonide-formoterol (SYMBICORT) 160-4.5 MCG/ACT inhaler Inhale 2 puffs into the lungs 2 (two) times daily.  3 Inhaler  1  . carvedilol (COREG) 12.5 MG tablet Take 1 tablet (12.5 mg total) by mouth 2 (two) times daily with a meal.  180 tablet  3  . cloNIDine (CATAPRES) 0.1 MG tablet Take one tablet each evening  90 tablet  3  . hydrocortisone-pramoxine (ANALPRAM-HC) 2.5-1 % rectal cream Place 1 applicator rectally 4 (four) times daily as needed.      Marland Kitchen lisinopril (PRINIVIL,ZESTRIL) 40 MG tablet TAKE ONE TABLET BY MOUTH DAILY  90 tablet  1  . nitroGLYCERIN (NITROSTAT) 0.4 MG SL tablet Place 0.4 mg under the tongue every 5 (five) minutes as needed. For chest pain      . Oxybutynin (GELNIQUE) 3 (28) % (MG/ACT) GEL Place onto the skin daily.        . pravastatin (PRAVACHOL) 20 MG tablet TAKE ONE TABLET BY MOUTH DAILY AT BEDTIME  90 tablet  3     Review of Systems See HPI     Objective:   Physical Exam Limited to skin  She has very large 10 by 13 cm indurated area just below R hip.  Pain in are when palpating       Assessment & Plan:  1)  Hypersensitivity reaction to TDap and pneumonia  Will give 60 mg prednisone taper.    Recheck  tomorrow afternoon  Addendum Friday 5/17:  Spoke with pt this am approx 10:00 am this morning  She notes bruising in area of R hip swelling and extension of edema further down her R leg.  Will get ultrasound of area today.    I saw pt in office 2:45 pm  .  Initial 13 cm area of swelling much smaller with ecchymosis in inferior section.  She does have localized edema extending down lateral surface of her R leg  - non-indurated and located below the initial indurated are of hip of 2 days ago.    A/P  Venous ultrasound pending,  With ecchymosis of now in area of initial induration around the R hip  I suspect venous bleeding post injection when pt was given her vaccines.  Pt is to call on Monday to give update.  She is aware if any further swelling or fever or concerns, she can go to urgent care.

## 2011-06-12 NOTE — Patient Instructions (Signed)
Recheck tomorrow afternoon.

## 2011-06-13 ENCOUNTER — Telehealth: Payer: Self-pay | Admitting: *Deleted

## 2011-06-13 ENCOUNTER — Encounter: Payer: Self-pay | Admitting: Internal Medicine

## 2011-06-13 ENCOUNTER — Encounter: Payer: 59 | Admitting: Internal Medicine

## 2011-06-13 ENCOUNTER — Other Ambulatory Visit: Payer: Self-pay | Admitting: Internal Medicine

## 2011-06-13 VITALS — BP 120/60 | HR 67 | Temp 97.1°F | Resp 16 | Ht 65.0 in | Wt 85.0 lb

## 2011-06-13 DIAGNOSIS — N390 Urinary tract infection, site not specified: Secondary | ICD-10-CM

## 2011-06-13 DIAGNOSIS — T7840XA Allergy, unspecified, initial encounter: Secondary | ICD-10-CM

## 2011-06-13 MED ORDER — CIPROFLOXACIN HCL 250 MG PO TABS: 250.0000 mg | ORAL_TABLET | Freq: Two times a day (BID) | ORAL | Status: AC

## 2011-06-13 NOTE — Telephone Encounter (Signed)
Notes Recorded by Linna Hoff, RN on 05/15/2011 at 3:37 PM Discussed Dr Lauro Franklin findings and recommendations with pt. We spoke for a while about whether her esophagus could be dilated. She would like to think about doing the EGD and would like for Dr Rhea Belton to discuss it more when she comes in on Monday for her COLON. Note left on COLON appt . ------  Notes Recorded by Linna Hoff, RN on 05/15/2011 at 10:42 AM Lm for ppt to call back with the person that answered the call. ------  Notes Recorded by Beverley Fiedler, MD on 05/14/2011 at 8:49 AM The barium esophagram is abnormal. She has had prior esophageal injury during a chest surgery which required esophageal repair. This likely has led to some of the abnormalities seen by this study. Perhaps dilation could provide some benefit to her swallowing trouble, but it is also possible it will not (given the prior injury and repair). Likely, the only way we will know is with EGD. We discussed this some at her last visit. If she would like this done, it can be done at [facility] with [facility] on my next hospital week. Thanks

## 2011-06-13 NOTE — Telephone Encounter (Signed)
Spoke with pt and we discussed possible EGD. She reports per note below, Dr Rhea Belton did discuss the EGD with possible dilation, but he flet like it needs to be done at the hospital. Asked pt if she's still agreeable to the procedure and can she do it around June 4-6, 2013 and she can.

## 2011-06-13 NOTE — Telephone Encounter (Signed)
Scheduled pt for EGD with possible Dilation for 07/04/11 at 0930am. Pt requested I fax instructions to [phone]. Phoned pt back and she received the information and will call for questions or problems.

## 2011-06-13 NOTE — Telephone Encounter (Signed)
Pt notified that she does have a UTI and per Dr Constance Goltz, RX for Cipro 250mg  #10 BID sent to [facility] .

## 2011-06-13 NOTE — Progress Notes (Signed)
  Subjective:    Patient ID: Tonya Harper, female    DOB: 1945/03/06, 66 y.o.   MRN: [id number]  HPI  Swelling improved in R hip area  Measures 10 cm today.   Review of Systems     Objective:   Physical Exam        Assessment & Plan:

## 2011-06-13 NOTE — Telephone Encounter (Signed)
Pt called to report she has been dx with thyroid nodules and will see Dr Assunta Found on Monday; didn't know if it would interfere with her EGD. (U/S and Thyroid scan in [facility]). Asked pt to inform Dr Talmage Nap of her EGD and let us know if we need to postpone the EGD; pt stated understanding.

## 2011-06-14 ENCOUNTER — Ambulatory Visit (HOSPITAL_BASED_OUTPATIENT_CLINIC_OR_DEPARTMENT_OTHER)
Admission: RE | Admit: 2011-06-14 | Discharge: 2011-06-14 | Disposition: A | Discharge status: Home / Self Care | Payer: 59 | Source: Ambulatory Visit | Attending: Internal Medicine | Admitting: Internal Medicine

## 2011-06-14 ENCOUNTER — Other Ambulatory Visit: Payer: Self-pay | Admitting: Internal Medicine

## 2011-06-14 DIAGNOSIS — R609 Edema, unspecified: Secondary | ICD-10-CM

## 2011-06-17 ENCOUNTER — Telehealth: Payer: Self-pay | Admitting: Internal Medicine

## 2011-06-17 ENCOUNTER — Other Ambulatory Visit: Payer: Self-pay | Admitting: *Deleted

## 2011-06-17 ENCOUNTER — Other Ambulatory Visit: Payer: Self-pay | Admitting: Endocrinology

## 2011-06-17 DIAGNOSIS — J40 Bronchitis, not specified as acute or chronic: Secondary | ICD-10-CM

## 2011-06-17 DIAGNOSIS — E041 Nontoxic single thyroid nodule: Secondary | ICD-10-CM

## 2011-06-17 DIAGNOSIS — J449 Chronic obstructive pulmonary disease, unspecified: Secondary | ICD-10-CM

## 2011-06-17 LAB — URINE CULTURE: Colony Count: >=100000

## 2011-06-17 MED ORDER — BUDESONIDE-FORMOTEROL FUMARATE 160-4.5 MCG/ACT IN AERO: 2.0000 | INHALATION_SPRAY | Freq: Two times a day (BID) | RESPIRATORY_TRACT | Status: DC

## 2011-06-17 NOTE — Telephone Encounter (Addendum)
Received fax for 90 day refill, requesting  If want generic or not, with number of refills, last  Seen 06/12/11

## 2011-06-17 NOTE — Telephone Encounter (Signed)
Spoke with pt.  Leg still swollen she believes it may be more swollen.  Will see in office in am . She voices understanding

## 2011-06-17 NOTE — Telephone Encounter (Signed)
Tonya Harper was calling back to give you an update on her knot near the injection site.  It is not improving, the swelling around the hip has now extended down the outside of her thigh.  I told [name] either the nurse of doctor would call her back with plan of care instructions.

## 2011-06-18 ENCOUNTER — Ambulatory Visit (INDEPENDENT_AMBULATORY_CARE_PROVIDER_SITE_OTHER): Payer: 59 | Admitting: Internal Medicine

## 2011-06-18 ENCOUNTER — Encounter: Payer: Self-pay | Admitting: Internal Medicine

## 2011-06-18 VITALS — BP 161/77 | HR 69 | Temp 98.7°F | Wt 86.0 lb

## 2011-06-18 DIAGNOSIS — T7840XA Allergy, unspecified, initial encounter: Secondary | ICD-10-CM

## 2011-06-18 NOTE — Patient Instructions (Signed)
See me next week  Will need urine specimen

## 2011-06-18 NOTE — Progress Notes (Signed)
Subjective:    Patient ID: Tonya Harper, female    DOB: 1945-07-18, 66 y.o.   MRN: [id number]  HPI Pt is here for follow up of indurated area R hip.   Edema of lateral thigh is decreasing and localized induration now measures 7 cm.  Tonya Harper was worried about a small bruise near iliac area.    See pap neg I gave copy to pt.   She did see Dr. Talmage Harper yesterday who plans a biopsy and possible radiation after her esophageal issues are managed with Dr. Rhea Harper.    She is finishing her course of Cipro for her Ecoli UTI   Allergies  Allergen Reactions  . Aspirin     nausea and dizziness after taking for one month at a time.. States her physician told her to use the 81 mg vs the 325mg .  . Pneumovax (Pneumococcal Polysaccharides)   . Shellfish Allergy     Medication withdrawal symptoms  . Tdap (Diphth-Acell Pertussis-Tetanus)    Past Medical History  Diagnosis Date  . CAD   . Chronic systolic heart failure   . COPD   . Ischemic cardiomyopathy   . Anemia   . Hypertension   . Urge incontinence of urine   . CHF (congestive heart failure)   . Hearing loss   . Foot swelling   . Rectal bleeding   . Personal history of colonic polyps 02/27/2011  . Hemorrhoids, Right posterior, internal, with prolapse & bleeding 02/27/2011   Past Surgical History  Procedure Date  . Tumor removed   . Abdominal hysterectomy 1976  . Band hemorrhoidectomy   . Appendectomy    History   Social History  . Marital Status: Married    Spouse Name: N/A    Number of Children: 2  . Years of Education: N/A   Occupational History  . unemployed     Retired   Social History Main Topics  . Smoking status: Former Smoker -- 0.5 packs/day for 40 years    Types: Cigarettes    Quit date: 02/14/2011  . Smokeless tobacco: Never Used  . Alcohol Use: Yes     socially once or twice per year  . Drug Use: No  . Sexually Active: Yes    Birth Control/ Protection: Surgical   Other Topics Concern  . Not on file   Social  History Narrative  . No narrative on file   Family History  Problem Relation Age of Onset  . Heart attack Father   . Early death Father 73  . Stroke Father   . Heart disease Father   . Coronary artery disease Brother   . Heart disease Brother     MI @ 16  . Cancer Brother     prostate  . Kidney disease Mother   . Heart disease Sister     MI @ 1  . Heart disease Brother     MI in 78s  . Cancer Brother     prostate   Patient Active Problem List  Diagnoses  . PURE HYPERCHOLESTEROLEMIA  . HYPOKALEMIA  . OTHER SPECIFIED ANEMIAS  . TOBACCO ABUSE  . CAD  . CARDIOMYOPATHY  . CHRONIC SYSTOLIC HEART FAILURE  . COPD  . OTHER SPEC FORMS CHRONIC ISCHEMIC HEART DISEASE  . Bruit  . Hemorrhoids, internal, with prolapse & bleeding  . Personal history of colonic polyps, last colonoscopy 1996 (x6 then)  . Weight loss  . Esophageal injury  . Esophageal stricture  . Thyroid nodule, cold  .  Thyroid nodule, hot  . Hypersensitivity reaction   Current Outpatient Prescriptions on File Prior to Visit  Medication Sig Dispense Refill  . acetaminophen (TYLENOL) 325 MG tablet Take 650 mg by mouth every 6 (six) hours as needed. For pain      . albuterol (PROVENTIL HFA;VENTOLIN HFA) 108 (90 BASE) MCG/ACT inhaler Inhale 1-2 puffs into the lungs every 6 (six) hours as needed for wheezing.  1 Inhaler  6  . budesonide-formoterol (SYMBICORT) 160-4.5 MCG/ACT inhaler Inhale 2 puffs into the lungs 2 (two) times daily.  3 Inhaler  1  . carvedilol (COREG) 12.5 MG tablet Take 1 tablet (12.5 mg total) by mouth 2 (two) times daily with a meal.  180 tablet  3  . Cholecalciferol (VITAMIN D3) 50000 UNITS CAPS Take 1 capsule by mouth once a week.  4 capsule  0  . ciprofloxacin (CIPRO) 250 MG tablet Take 1 tablet (250 mg total) by mouth 2 (two) times daily.  10 tablet  0  . cloNIDine (CATAPRES) 0.1 MG tablet Take one tablet each evening  90 tablet  3  . hydrocortisone-pramoxine (ANALPRAM-HC) 2.5-1 % rectal cream  Place 1 applicator rectally 4 (four) times daily as needed.      Marland Kitchen lisinopril (PRINIVIL,ZESTRIL) 40 MG tablet TAKE ONE TABLET BY MOUTH DAILY  90 tablet  1  . nitroGLYCERIN (NITROSTAT) 0.4 MG SL tablet Place 0.4 mg under the tongue every 5 (five) minutes as needed. For chest pain      . Oxybutynin (GELNIQUE) 3 (28) % (MG/ACT) GEL Place onto the skin daily.        . pravastatin (PRAVACHOL) 20 MG tablet TAKE ONE TABLET BY MOUTH DAILY AT BEDTIME  90 tablet  3  . predniSONE (DELTASONE) 20 MG tablet Take 3 tablets daily for 3 days then 2 tablets for 3 days then 1 tablet for 3 days then stop  18 tablet  0     Review of Systems see HPI   Objective:   Physical Exam Physical Exam  Nursing note and vitals reviewed.  Constitutional: She is oriented to person, place, and time. She appears well-developed and well-nourished.  HENT:  Head: Normocephalic and atraumatic.  Cardiovascular: Normal rate and regular rhythm. Exam reveals no gallop and no friction rub.  No murmur heard.  Pulmonary/Chest: Breath sounds normal. She has no wheezes. She has no rales.  Neurological: She is alert and oriented to person, place, and time.  Skin: Skin is warm and dry.  Induration R hip measures 7 cm.  Lateral edema of thing decreasing.  I see very small healing ecchymotic area near iliac area Psychiatric: She has a normal mood and affect. Her behavior is normal.        Assessment & Plan:  1)  Induration post vaccine.  U/s shows edema.  Advised induration will slowly resolve over many weeks.  OK to take Tylenol 2 tabs at hs 2)  Uti   willl ge repeat U/A next week 3) Thyroid nodule  Biopsy pending  Managed Dr. Talmage Harper 4)  Esophageal injury/stricture   Possible dilation of possible pending  See me next week or prn

## 2011-06-25 ENCOUNTER — Ambulatory Visit
Admission: RE | Admit: 2011-06-25 | Discharge: 2011-06-25 | Disposition: A | Discharge status: Home / Self Care | Payer: 59 | Source: Ambulatory Visit | Attending: Endocrinology | Admitting: Endocrinology

## 2011-06-25 ENCOUNTER — Telehealth: Payer: Self-pay | Admitting: *Deleted

## 2011-06-25 ENCOUNTER — Other Ambulatory Visit (HOSPITAL_COMMUNITY)
Admission: RE | Admit: 2011-06-25 | Discharge: 2011-06-25 | Disposition: A | Discharge status: Home / Self Care | Payer: 59 | Source: Ambulatory Visit | Attending: Interventional Radiology | Admitting: Interventional Radiology

## 2011-06-25 ENCOUNTER — Other Ambulatory Visit (INDEPENDENT_AMBULATORY_CARE_PROVIDER_SITE_OTHER): Payer: 59 | Admitting: Internal Medicine

## 2011-06-25 DIAGNOSIS — E041 Nontoxic single thyroid nodule: Secondary | ICD-10-CM

## 2011-06-25 DIAGNOSIS — N390 Urinary tract infection, site not specified: Secondary | ICD-10-CM

## 2011-06-25 LAB — POCT URINALYSIS DIPSTICK
Bilirubin, UA: NEGATIVE
Glucose, UA: NEGATIVE
Ketones, UA: NEGATIVE
Nitrite, UA: NEGATIVE
Protein, UA: NEGATIVE
Spec Grav, UA: 1.02
Urobilinogen, UA: 0.2
pH, UA: 6

## 2011-06-25 NOTE — Telephone Encounter (Signed)
Notified pt that her urine today did indeed still have a little blood present.  The urine was sent to lab for C&S.  Will notify pt with results.  Area where she received vaccines per pt is looking much better and the swelling and bruising has gone.

## 2011-06-26 ENCOUNTER — Ambulatory Visit (INDEPENDENT_AMBULATORY_CARE_PROVIDER_SITE_OTHER): Payer: 59 | Admitting: Cardiology

## 2011-06-26 ENCOUNTER — Encounter: Payer: Self-pay | Admitting: Cardiology

## 2011-06-26 VITALS — BP 142/80 | HR 64 | Ht 64.0 in | Wt 86.0 lb

## 2011-06-26 DIAGNOSIS — F17200 Nicotine dependence, unspecified, uncomplicated: Secondary | ICD-10-CM

## 2011-06-26 DIAGNOSIS — I428 Other cardiomyopathies: Secondary | ICD-10-CM

## 2011-06-26 DIAGNOSIS — I2510 Atherosclerotic heart disease of native coronary artery without angina pectoris: Secondary | ICD-10-CM

## 2011-06-26 DIAGNOSIS — E7800 Pure hypercholesterolemia, unspecified: Secondary | ICD-10-CM

## 2011-06-26 DIAGNOSIS — I10 Essential (primary) hypertension: Secondary | ICD-10-CM | POA: Insufficient documentation

## 2011-06-26 MED ORDER — CARVEDILOL 6.25 MG PO TABS: 6.2500 mg | ORAL_TABLET | Freq: Two times a day (BID) | ORAL | Status: DC

## 2011-06-26 NOTE — Assessment & Plan Note (Addendum)
Continue ACE inhibitor and beta blocker. Increase Coreg to 18.75 mg by mouth twice a day. We again discussed ICD. She continues to consider. She understands risk of sudden death.

## 2011-06-26 NOTE — Patient Instructions (Addendum)
Your physician wants you to follow-up in: 6 MONTHS WITH DR Jens Som You will receive a reminder letter in the mail two months in advance. If you don't receive a letter, please call our office to schedule the follow-up appointment.   Your physician has requested that you have a lexiscan myoview. For further information please visit [email or web address] Please follow instruction sheet, as given.   TAKE CARVEDILOL 6.25 MG TWICE DAILY WITH THE CURRENT CARVEDILOL

## 2011-06-26 NOTE — Assessment & Plan Note (Signed)
Continue statin. Not at goal. We discussed changing statins but she declined.

## 2011-06-26 NOTE — Assessment & Plan Note (Signed)
Continue aspirin and statin. Schedule Myoview for risk stratification. 

## 2011-06-26 NOTE — Assessment & Plan Note (Signed)
Blood pressure mildly elevated. Increase Coreg to 18.75 mg by mouth twice a day.

## 2011-06-26 NOTE — Assessment & Plan Note (Signed)
Patient has discontinued. 

## 2011-06-26 NOTE — Progress Notes (Signed)
HPI: Pleasant female with coronary artery disease and cardiomyopathy for fu. A cardiac catheterization was performed on September 21 of 2010. This revealed left main with 50% eccentric distal stenosis, left anterior descending coronary artery 60% mid tubular lesion (does not appear to be flow limiting), circumflex occluded in its mid portion and distal circ appears to fill from septal collaterals, right coronary artery appeared to be a small and likely nondominant, subtotally occluded in the mid vessel with faint filling of the distal vessel. Left ventricular ejection fraction in the 40% range. Medical management recommended. An echocardiogram during that admission revealed an ejection fraction of 20-25%, mild aortic and mitral regurgitation and right ventricular enlargement. Repeat echocardiogram in Feb of 2012 revealed an ejection fraction of 25 - 30%, mild LAE and mild AI and MR. She was seen by Dr. Ladona Ridgel and ICD recommended; she has declined thus far. Abdominal ultrasound in November of 2012 showed no aneurysm. I last saw her in Oct 2012. Since then, she does have mild dyspnea on exertion but this improves with inhalers. No orthopnea, PND, pedal edema or syncope. No chest pain. Occasional brief flutter but no sustained palpitations.   Current Outpatient Prescriptions  Medication Sig Dispense Refill  . acetaminophen (TYLENOL) 325 MG tablet Take 650 mg by mouth every 6 (six) hours as needed. For pain      . albuterol (PROVENTIL HFA;VENTOLIN HFA) 108 (90 BASE) MCG/ACT inhaler Inhale 1-2 puffs into the lungs every 6 (six) hours as needed for wheezing.  1 Inhaler  6  . budesonide-formoterol (SYMBICORT) 160-4.5 MCG/ACT inhaler Inhale 2 puffs into the lungs 2 (two) times daily.  3 Inhaler  1  . carvedilol (COREG) 12.5 MG tablet Take 1 tablet (12.5 mg total) by mouth 2 (two) times daily with a meal.  180 tablet  3  . Cholecalciferol (VITAMIN D3) 50000 UNITS CAPS Take 1 capsule by mouth once a week.  4  capsule  0  . cloNIDine (CATAPRES) 0.1 MG tablet Take one tablet each evening  90 tablet  3  . hydrocortisone-pramoxine (ANALPRAM-HC) 2.5-1 % rectal cream Place 1 applicator rectally 4 (four) times daily as needed.      Marland Kitchen lisinopril (PRINIVIL,ZESTRIL) 40 MG tablet TAKE ONE TABLET BY MOUTH DAILY  90 tablet  1  . nitroGLYCERIN (NITROSTAT) 0.4 MG SL tablet Place 0.4 mg under the tongue every 5 (five) minutes as needed. For chest pain      . Oxybutynin (GELNIQUE) 3 (28) % (MG/ACT) GEL Place onto the skin daily.        . pravastatin (PRAVACHOL) 20 MG tablet TAKE ONE TABLET BY MOUTH DAILY AT BEDTIME  90 tablet  3     Past Medical History  Diagnosis Date  . CAD   . Chronic systolic heart failure   . COPD   . Ischemic cardiomyopathy   . Anemia   . Hypertension   . Urge incontinence of urine   . Hearing loss   . Personal history of colonic polyps 02/27/2011  . Hemorrhoids, Right posterior, internal, with prolapse & bleeding 02/27/2011    Past Surgical History  Procedure Date  . Tumor removed   . Abdominal hysterectomy 1976  . Band hemorrhoidectomy   . Appendectomy     History   Social History  . Marital Status: Married    Spouse Name: N/A    Number of Children: 2  . Years of Education: N/A   Occupational History  . unemployed     Retired  Social History Main Topics  . Smoking status: Former Smoker -- 0.5 packs/day for 40 years    Types: Cigarettes    Quit date: 02/14/2011  . Smokeless tobacco: Never Used  . Alcohol Use: Yes     socially once or twice per year  . Drug Use: No  . Sexually Active: Yes    Birth Control/ Protection: Surgical   Other Topics Concern  . Not on file   Social History Narrative  . No narrative on file    ROS: no fevers or chills, productive cough, hemoptysis, dysphasia, odynophagia, melena, hematochezia, dysuria, hematuria, rash, seizure activity, orthopnea, PND, pedal edema, claudication. Remaining systems are negative.  Physical  Exam: Well-developed well-nourished in no acute distress.  Skin is warm and dry.  HEENT is normal.  Neck is supple.  Chest is clear to auscultation with normal expansion.  Cardiovascular exam is regular rate and rhythm.  Abdominal exam nontender or distended. No masses palpated. Extremities show no edema. neuro grossly intact  ECG 03/25/2011-sinus rhythm at a rate of 59. Left ventricular hypertrophy. Inferior T-wave changes.

## 2011-06-27 LAB — CULTURE, URINE COMPREHENSIVE
Colony Count: NO GROWTH
Organism ID, Bacteria: NO GROWTH

## 2011-06-27 NOTE — Telephone Encounter (Signed)
Spoke with pt to see if she asked Dr Talmage Nap about proceeding with the EGD. She reports Dr Talmage Nap wants her to proceed in case she does have to have tx for her enlarged thyroid. Dr Talmage Nap has not called her with results, but the path is back and pt will proceed with the EGD on 07/04/11 at [facility] unless she call us.

## 2011-07-01 ENCOUNTER — Ambulatory Visit (HOSPITAL_COMMUNITY): Payer: 59 | Attending: Cardiology | Admitting: Radiology

## 2011-07-01 VITALS — BP 137/70 | Ht 64.0 in | Wt 86.0 lb

## 2011-07-01 DIAGNOSIS — R0989 Other specified symptoms and signs involving the circulatory and respiratory systems: Secondary | ICD-10-CM | POA: Insufficient documentation

## 2011-07-01 DIAGNOSIS — R0602 Shortness of breath: Secondary | ICD-10-CM

## 2011-07-01 DIAGNOSIS — I4949 Other premature depolarization: Secondary | ICD-10-CM

## 2011-07-01 DIAGNOSIS — Z8249 Family history of ischemic heart disease and other diseases of the circulatory system: Secondary | ICD-10-CM | POA: Insufficient documentation

## 2011-07-01 DIAGNOSIS — I2510 Atherosclerotic heart disease of native coronary artery without angina pectoris: Secondary | ICD-10-CM | POA: Insufficient documentation

## 2011-07-01 DIAGNOSIS — R002 Palpitations: Secondary | ICD-10-CM | POA: Insufficient documentation

## 2011-07-01 DIAGNOSIS — J4489 Other specified chronic obstructive pulmonary disease: Secondary | ICD-10-CM | POA: Insufficient documentation

## 2011-07-01 DIAGNOSIS — Z87891 Personal history of nicotine dependence: Secondary | ICD-10-CM | POA: Insufficient documentation

## 2011-07-01 DIAGNOSIS — J449 Chronic obstructive pulmonary disease, unspecified: Secondary | ICD-10-CM | POA: Insufficient documentation

## 2011-07-01 DIAGNOSIS — I10 Essential (primary) hypertension: Secondary | ICD-10-CM | POA: Insufficient documentation

## 2011-07-01 DIAGNOSIS — R0609 Other forms of dyspnea: Secondary | ICD-10-CM | POA: Insufficient documentation

## 2011-07-01 DIAGNOSIS — E785 Hyperlipidemia, unspecified: Secondary | ICD-10-CM | POA: Insufficient documentation

## 2011-07-01 DIAGNOSIS — Z951 Presence of aortocoronary bypass graft: Secondary | ICD-10-CM | POA: Insufficient documentation

## 2011-07-01 DIAGNOSIS — R9431 Abnormal electrocardiogram [ECG] [EKG]: Secondary | ICD-10-CM | POA: Insufficient documentation

## 2011-07-01 MED ORDER — REGADENOSON 0.4 MG/5ML IV SOLN
0.4000 mg | Freq: Once | INTRAVENOUS | Status: AC
  Administered 2011-07-01: 0.4 mg via INTRAVENOUS

## 2011-07-01 MED ORDER — TECHNETIUM TC 99M TETROFOSMIN IV KIT
10.0000 | PACK | Freq: Once | INTRAVENOUS | Status: AC | PRN
  Administered 2011-07-01: 10 via INTRAVENOUS

## 2011-07-01 MED ORDER — TECHNETIUM TC 99M TETROFOSMIN IV KIT
30.0000 | PACK | Freq: Once | INTRAVENOUS | Status: AC | PRN
  Administered 2011-07-01: 30 via INTRAVENOUS

## 2011-07-01 NOTE — Progress Notes (Signed)
[facility] SITE 3 NUCLEAR MED [address] [place] [address] [phone]  Cardiology Nuclear Med Study  Tonya Harper is a 66 y.o. female     MRN : [id number]     DOB: [birth year]  Procedure Date: 07/01/2011  Nuclear Med Background Indication for Stress Test:  Evaluation for Ischemia History:  COPD and Abnormal EKG, CABG Cardiomyopathy 2010 Heart Cath: EF: 40% ICM (L) MAin 50% distal , multiple N/O CAD Rx Tx, 2/12: ECHO: EF:25-30% mild AI mild MR, ICD recommended but D/C x2 Cardiac Risk Factors: Family History - CAD, History of Smoking, Hypertension, Lipids and Smoker  Symptoms:  DOE and Palpitations   Nuclear Pre-Procedure Caffeine/Decaff Intake:  None NPO After: 7:30am   Lungs:  clear O2 Sat: 90% on room air. IV 0.9% NS with Angio Cath:  22g  IV Site: R Antecubital  IV Started by:  Stanton Kidney, EMT-P  Chest Size (in):  34 Cup Size: C  Height: 5\' 4"  (1.626 m)  Weight:  86 lb (39.009 kg)  BMI:  Body mass index is 14.76 kg/(m^2). Tech Comments:  Coreg was taken at 7:30am, per patient.    Nuclear Med Study 1 or 2 day study: 1 day  Stress Test Type:  Eugenie Birks  Reading MD: Willa Rough, MD  Order Authorizing Provider:  B.Crenshaw MD  Resting Radionuclide: Technetium 73m Tetrofosmin  Resting Radionuclide Dose: 11.0 mCi   Stress Radionuclide:  Technetium 32m Tetrofosmin  Stress Radionuclide Dose: 32.5 mCi           Stress Protocol Rest HR: 61 Stress HR: 82  Rest BP: 137/70 Stress BP: 159/67  Exercise Time (min): n/a METS: n/a   Predicted Max HR: 156 bpm % Max HR: 52.56 bpm Rate Pressure Product: 21308   Dose of Adenosine (mg):  n/a Dose of Lexiscan: 0.4 mg  Dose of Atropine (mg): n/a Dose of Dobutamine: n/a mcg/kg/min (at max HR)  Stress Test Technologist: Milana Na, EMT-P  Nuclear Technologist:  Domenic Polite, CNMT     Rest Procedure:  Myocardial perfusion imaging was performed at rest 45 minutes following the intravenous administration of  Technetium 52m Tetrofosmin. Rest ECG: NSR - Normal EKG  Stress Procedure:  The patient received IV Lexiscan 0.4 mg over 15-seconds.  Technetium 11m Tetrofosmin injected at 30-seconds.  There were no significant changes, + sob, headache, and rare pvcs with Lexiscan.  Quantitative spect images were obtained after a 45 minute delay. Stress ECG: No significant change from baseline ECG  QPS Raw Data Images:  Patient motion noted; appropriate software correction applied. Stress Images:  There is moderate decrease in activity in a moderately large area affecting the base of the inferoseptal segment, the base and mid and apical inferior segment, and the base and mid and apical inferolateral segment Rest Images:  The findings are similar to the stress images. Subtraction (SDS):  There is partial reversibility in the mid inferolateral segment consistent with ischemia Transient Ischemic Dilatation (Normal <1.22):  1.07 Lung/Heart Ratio (Normal <0.45):  0.18  Quantitative Gated Spect Images QGS EDV:  204 ml QGS ESV:  147 ml  Impression Exercise Capacity:  Lexiscan with no exercise. BP Response:  Normal blood pressure response. Clinical Symptoms:  shortness of breath ECG Impression:  No significant ST segment change suggestive of ischemia. Comparison with Prior Nuclear Study: No images to compare  Overall Impression:  Abnormal nuclear scan. There is a moderate area of scar affecting the inferior and inferolateral walls. There is mild to  moderate peri-infarct ischemia in this area.  LV Ejection Fraction: 28%.  LV Wall Motion:  There is global hypokinesis. This could be a combination of abnormalities related to injury and septal dysfunction with a history of CABG.  Willa Rough, MD

## 2011-07-03 ENCOUNTER — Encounter (HOSPITAL_COMMUNITY): Payer: Self-pay | Admitting: *Deleted

## 2011-07-04 ENCOUNTER — Encounter (HOSPITAL_COMMUNITY)
Admission: RE | Disposition: A | Discharge status: Home / Self Care | Payer: Self-pay | Source: Ambulatory Visit | Attending: Internal Medicine

## 2011-07-04 ENCOUNTER — Encounter (HOSPITAL_COMMUNITY): Payer: Self-pay | Admitting: Registered Nurse

## 2011-07-04 ENCOUNTER — Encounter (HOSPITAL_COMMUNITY): Payer: Self-pay | Admitting: *Deleted

## 2011-07-04 ENCOUNTER — Ambulatory Visit (HOSPITAL_COMMUNITY)
Admission: RE | Admit: 2011-07-04 | Discharge: 2011-07-04 | Disposition: A | Discharge status: Home / Self Care | Payer: 59 | Source: Ambulatory Visit | Attending: Internal Medicine | Admitting: Internal Medicine

## 2011-07-04 ENCOUNTER — Ambulatory Visit (HOSPITAL_COMMUNITY): Payer: 59 | Admitting: Registered Nurse

## 2011-07-04 DIAGNOSIS — J4489 Other specified chronic obstructive pulmonary disease: Secondary | ICD-10-CM | POA: Insufficient documentation

## 2011-07-04 DIAGNOSIS — B3781 Candidal esophagitis: Secondary | ICD-10-CM

## 2011-07-04 DIAGNOSIS — J449 Chronic obstructive pulmonary disease, unspecified: Secondary | ICD-10-CM | POA: Insufficient documentation

## 2011-07-04 DIAGNOSIS — I10 Essential (primary) hypertension: Secondary | ICD-10-CM | POA: Insufficient documentation

## 2011-07-04 DIAGNOSIS — R1310 Dysphagia, unspecified: Secondary | ICD-10-CM | POA: Insufficient documentation

## 2011-07-04 DIAGNOSIS — Z79899 Other long term (current) drug therapy: Secondary | ICD-10-CM | POA: Insufficient documentation

## 2011-07-04 DIAGNOSIS — I252 Old myocardial infarction: Secondary | ICD-10-CM | POA: Insufficient documentation

## 2011-07-04 DIAGNOSIS — K222 Esophageal obstruction: Secondary | ICD-10-CM | POA: Insufficient documentation

## 2011-07-04 DIAGNOSIS — I2589 Other forms of chronic ischemic heart disease: Secondary | ICD-10-CM | POA: Insufficient documentation

## 2011-07-04 DIAGNOSIS — K2210 Ulcer of esophagus without bleeding: Secondary | ICD-10-CM | POA: Insufficient documentation

## 2011-07-04 DIAGNOSIS — I509 Heart failure, unspecified: Secondary | ICD-10-CM | POA: Insufficient documentation

## 2011-07-04 DIAGNOSIS — K5730 Diverticulosis of large intestine without perforation or abscess without bleeding: Secondary | ICD-10-CM | POA: Insufficient documentation

## 2011-07-04 DIAGNOSIS — I2510 Atherosclerotic heart disease of native coronary artery without angina pectoris: Secondary | ICD-10-CM | POA: Insufficient documentation

## 2011-07-04 HISTORY — PX: SAVORY DILATION: SHX5439

## 2011-07-04 HISTORY — DX: Disorder of thyroid, unspecified: E07.9

## 2011-07-04 HISTORY — DX: Esophageal obstruction: K22.2

## 2011-07-04 HISTORY — DX: Candidal esophagitis: B37.81

## 2011-07-04 HISTORY — DX: Acute myocardial infarction, unspecified: I21.9

## 2011-07-04 SURGERY — EGD (ESOPHAGOGASTRODUODENOSCOPY)
Anesthesia: Monitor Anesthesia Care

## 2011-07-04 SURGERY — ESOPHAGOGASTRODUODENOSCOPY (EGD) WITH PROPOFOL
Anesthesia: Monitor Anesthesia Care

## 2011-07-04 MED ORDER — LACTATED RINGERS IV SOLN
INTRAVENOUS | Status: DC | PRN
  Administered 2011-07-04: 08:00:00 via INTRAVENOUS

## 2011-07-04 MED ORDER — PROPOFOL 10 MG/ML IV EMUL
INTRAVENOUS | Status: DC | PRN
  Administered 2011-07-04: 100 ug/kg/min via INTRAVENOUS

## 2011-07-04 MED ORDER — LIDOCAINE HCL (CARDIAC) 20 MG/ML IV SOLN
INTRAVENOUS | Status: DC | PRN
  Administered 2011-07-04: 20 mg via INTRAVENOUS

## 2011-07-04 MED ORDER — FLUCONAZOLE 100 MG PO TABS: 100.0000 mg | ORAL_TABLET | Freq: Every day | ORAL | Status: AC

## 2011-07-04 MED ORDER — MIDAZOLAM HCL 5 MG/5ML IJ SOLN
INTRAMUSCULAR | Status: DC | PRN
  Administered 2011-07-04: 1 mg via INTRAVENOUS

## 2011-07-04 SURGICAL SUPPLY — 14 items

## 2011-07-04 NOTE — Anesthesia Postprocedure Evaluation (Signed)
  Anesthesia Post-op Note  Patient: Tonya Harper  Procedure(s) Performed: Procedure(s) (LRB): ESOPHAGOGASTRODUODENOSCOPY (EGD) WITH PROPOFOL (N/A) SAVORY DILATION (N/A)  Patient Location: PACU  Anesthesia Type: MAC  Level of Consciousness: awake and alert   Airway and Oxygen Therapy: Patient Spontanous Breathing  Post-op Pain: mild  Post-op Assessment: Post-op Vital signs reviewed, Patient's Cardiovascular Status Stable, Respiratory Function Stable, Patent Airway and No signs of Nausea or vomiting  Post-op Vital Signs: stable  Complications: No apparent anesthesia complications

## 2011-07-04 NOTE — Anesthesia Preprocedure Evaluation (Addendum)
Anesthesia Evaluation  Patient identified by MRN, date of birth, ID band Patient awake    Reviewed: Allergy & Precautions, H&P , NPO status , Patient's Chart, lab work & pertinent test results  Airway Mallampati: II TM Distance: >3 FB Neck ROM: Full    Dental No notable dental hx. (+) Edentulous Upper   Pulmonary neg pulmonary ROS, COPDformer smoker breath sounds clear to auscultation  Pulmonary exam normal       Cardiovascular hypertension, + CAD, + Past MI and +CHF negative cardio ROS  Rhythm:Regular Rate:Normal     Neuro/Psych negative neurological ROS  negative psych ROS   GI/Hepatic negative GI ROS, Neg liver ROS,   Endo/Other  negative endocrine ROS  Renal/GU negative Renal ROS  negative genitourinary   Musculoskeletal negative musculoskeletal ROS (+)   Abdominal   Peds negative pediatric ROS (+)  Hematology negative hematology ROS (+)   Anesthesia Other Findings   Reproductive/Obstetrics negative OB ROS                          Anesthesia Physical Anesthesia Plan  ASA: III  Anesthesia Plan: MAC   Post-op Pain Management:    Induction: Intravenous  Airway Management Planned:   Additional Equipment:   Intra-op Plan:   Post-operative Plan:   Informed Consent: I have reviewed the patients History and Physical, chart, labs and discussed the procedure including the risks, benefits and alternatives for the proposed anesthesia with the patient or authorized representative who has indicated his/her understanding and acceptance.   Dental advisory given  Plan Discussed with: CRNA  Anesthesia Plan Comments:         Anesthesia Quick Evaluation

## 2011-07-04 NOTE — Transfer of Care (Signed)
Immediate Anesthesia Transfer of Care Note  Patient: Tonya Harper  Procedure(s) Performed: Procedure(s) (LRB): ESOPHAGOGASTRODUODENOSCOPY (EGD) WITH PROPOFOL (N/A) SAVORY DILATION (N/A)  Patient Location: PACU  Anesthesia Type: MAC  Level of Consciousness: sedated  Airway & Oxygen Therapy: Patient Spontanous Breathing and Patient connected to nasal cannula oxygen  Post-op Assessment: Report given to PACU RN and Post -op Vital signs reviewed and stable  Post vital signs: stable  Complications: No apparent anesthesia complications
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
## 2011-07-04 NOTE — Op Note (Signed)
[facility] [address] [place], [place]  [address]  ENDOSCOPY PROCEDURE REPORT  PATIENT:  Tonya Harper, Tonya Harper  MR#:  [id number] BIRTHDATE:  24-May-1945, 66 yrs. old  GENDER:  female ENDOSCOPIST:  Carie Caddy. Neville Pauls, MD  PROCEDURE DATE:  07/04/2011 PROCEDURE:  EGD with biopsy, 43239 ASA CLASS:  Class III INDICATIONS:  dysphagia, abnormal imaging MEDICATIONS:   MAC sedation, administered by CRNA, See Anesthesia Report. TOPICAL ANESTHETIC:  Cetacaine Spray  DESCRIPTION OF PROCEDURE:   After the risks benefits and alternatives of the procedure were thoroughly explained, informed consent was obtained.  The EG-[id number] ([id number]) endoscope was introduced through the mouth and advanced to the second portion of the duodenum, without limitations.  The instrument was slowly withdrawn as the mucosa was fully examined. <<PROCEDUREIMAGES>>  A benign-appearing stricture was found in the proximal esophagus at 19 cm.  There was superficial ulceration just proximal to this narrowing.  The adult upper endoscope would not traverse this stricture, thus the scope was withdrawn and replaced with the pediatric upper endoscope.  There was moderate resistance with the pediatric endoscope, but the stricture was traversed.  There was evidence of a mild mucosal tear.  Multiple small-mouthed diverticula were found in the proximal and mid esophagus.  Candida esophagitis in the total esophagus, starting beyond the proximal stricture. Multiple biopsies were obtained and sent to pathology. The stomach was entered and closely examined. The antrum, angularis, and lesser curvature were well visualized, including a retroflexed view of the cardia and fundus. The stomach wall was normally distensable. The scope passed easily through the pylorus into the duodenum.  The duodenal bulb was normal in appearance, as was the postbulbar duodenum.    Retroflexed views revealed no abnormalities.    The scope was then  withdrawn from the patient and the procedure completed.  COMPLICATIONS:  None  ENDOSCOPIC IMPRESSION: 1) Stricture in the proximal esophagus. See above. 2) Diverticula in the proximal and mid esophagus 3) Candida esophagitis in the total esophagus 4) Normal stomach 5) Normal duodenum  RECOMMENDATIONS: 1) Fluconazole 200 mg for 1 day, then 100 mg for 13 days for candida esophagitis. 2) Office follow-up in about 3 weeks to assess response to today's procedure.  Carie Caddy. Rhea Belton, MD  CC:  The Patient Raechel Chute, MD  n. Rosalie DoctorCarie Caddy. Lowery Paullin at 07/04/2011 10:29 AM  [name], [phone]

## 2011-07-05 ENCOUNTER — Encounter (HOSPITAL_COMMUNITY): Payer: Self-pay | Admitting: Internal Medicine

## 2011-07-07 ENCOUNTER — Encounter: Payer: Self-pay | Admitting: Internal Medicine

## 2011-07-18 ENCOUNTER — Other Ambulatory Visit: Payer: Self-pay | Admitting: Internal Medicine

## 2011-07-18 ENCOUNTER — Ambulatory Visit (INDEPENDENT_AMBULATORY_CARE_PROVIDER_SITE_OTHER): Payer: 59 | Admitting: Internal Medicine

## 2011-07-18 ENCOUNTER — Encounter: Payer: Self-pay | Admitting: Internal Medicine

## 2011-07-18 VITALS — BP 160/72 | HR 58 | Temp 97.5°F | Resp 17 | Ht 64.0 in | Wt 84.0 lb

## 2011-07-18 DIAGNOSIS — N760 Acute vaginitis: Secondary | ICD-10-CM

## 2011-07-18 DIAGNOSIS — N9489 Other specified conditions associated with female genital organs and menstrual cycle: Secondary | ICD-10-CM

## 2011-07-18 DIAGNOSIS — N390 Urinary tract infection, site not specified: Secondary | ICD-10-CM

## 2011-07-18 DIAGNOSIS — N949 Unspecified condition associated with female genital organs and menstrual cycle: Secondary | ICD-10-CM

## 2011-07-18 LAB — POCT URINALYSIS DIPSTICK
Bilirubin, UA: NEGATIVE
Blood, UA: NEGATIVE
Glucose, UA: NEGATIVE
Ketones, UA: NEGATIVE
Nitrite, UA: NEGATIVE
Protein, UA: NEGATIVE
Spec Grav, UA: 1.01
Urobilinogen, UA: NEGATIVE
pH, UA: 6

## 2011-07-18 MED ORDER — DOXYCYCLINE HYCLATE 100 MG PO TABS: 100.0000 mg | ORAL_TABLET | Freq: Two times a day (BID) | ORAL | Status: DC
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
## 2011-07-19 LAB — GC/CHLAMYDIA PROBE AMP, GENITAL
Chlamydia, DNA Probe: NEGATIVE
GC Probe Amp, Genital: NEGATIVE

## 2011-07-24 ENCOUNTER — Encounter: Payer: Self-pay | Admitting: Cardiology

## 2011-07-24 ENCOUNTER — Ambulatory Visit (INDEPENDENT_AMBULATORY_CARE_PROVIDER_SITE_OTHER): Payer: 59 | Admitting: Cardiology

## 2011-07-24 VITALS — BP 122/70 | HR 50 | Ht 64.0 in | Wt 83.0 lb

## 2011-07-24 DIAGNOSIS — E7800 Pure hypercholesterolemia, unspecified: Secondary | ICD-10-CM

## 2011-07-24 DIAGNOSIS — I10 Essential (primary) hypertension: Secondary | ICD-10-CM

## 2011-07-24 DIAGNOSIS — I428 Other cardiomyopathies: Secondary | ICD-10-CM

## 2011-07-24 LAB — WET PREP, GENITAL
Trich, Wet Prep: NONE SEEN
Yeast Wet Prep HPF POC: NONE SEEN

## 2011-07-24 MED ORDER — CARVEDILOL 12.5 MG PO TABS: 12.5000 mg | ORAL_TABLET | Freq: Two times a day (BID) | ORAL | Status: DC

## 2011-07-24 MED ORDER — CARVEDILOL 6.25 MG PO TABS: 6.2500 mg | ORAL_TABLET | Freq: Two times a day (BID) | ORAL | Status: DC

## 2011-07-24 MED ORDER — ASPIRIN EC 81 MG PO TBEC: 81.0000 mg | DELAYED_RELEASE_TABLET | Freq: Every day | ORAL | Status: AC
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
## 2011-07-24 NOTE — Patient Instructions (Addendum)
Your physician wants you to follow-up in: ONE YEAR WITH DR Jens Som IN [place] You will receive a reminder letter in the mail two months in advance. If you don't receive a letter, please call our office to schedule the follow-up appointment.   STOP CLONIDINE

## 2011-07-24 NOTE — Assessment & Plan Note (Signed)
Continue aspirin and statin. I have reviewed her Myoview images. She appears to have a previous inferior infarct with mild peri-infarct ischemia. This would correlate with her occluded circumflex. She is not having chest pain. Plan medical therapy.

## 2011-07-24 NOTE — Assessment & Plan Note (Addendum)
Continue ACE inhibitor and carvedilol. I will not advance her carvedilol as her heart rate is 50. We again discussed ICD. She states she will consider this in the fall but not at present. She understands the risk of sudden cardiac death. I have asked her to contact us if she is willing to proceed.

## 2011-07-24 NOTE — Assessment & Plan Note (Signed)
Patient's blood pressure is controlled. She is only taking clonidine 0.1 mg daily. I will discontinue to consolidate medications. Follow blood pressure and heart rate and increase carvedilol if her blood pressure and heart rate will allow in the future.

## 2011-07-24 NOTE — Assessment & Plan Note (Signed)
Continue statin. 

## 2011-07-29 ENCOUNTER — Ambulatory Visit (INDEPENDENT_AMBULATORY_CARE_PROVIDER_SITE_OTHER): Payer: 59 | Admitting: Internal Medicine

## 2011-07-29 ENCOUNTER — Encounter: Payer: Self-pay | Admitting: Internal Medicine

## 2011-07-29 VITALS — BP 140/72 | HR 60 | Ht 64.0 in | Wt 81.0 lb

## 2011-07-29 DIAGNOSIS — K222 Esophageal obstruction: Secondary | ICD-10-CM

## 2011-07-29 DIAGNOSIS — B3781 Candidal esophagitis: Secondary | ICD-10-CM

## 2011-07-29 DIAGNOSIS — Z86010 Personal history of colonic polyps: Secondary | ICD-10-CM

## 2011-07-29 NOTE — Patient Instructions (Addendum)
You have been scheduled for an endoscopy at [facility] on 08/13/2011 10:30am. Please follow written instructions given to you at your visit today.

## 2011-07-29 NOTE — Progress Notes (Signed)
Subjective:    Patient ID: Tonya Harper, female    DOB: 11/21/1945, 66 y.o.   MRN: [id number]  HPI Tonya Harper is a 66 yo female with PMH of CAD with cardiomyopathy with low EF pending ICD placement, COPD, hypercholesterolemia, colonic polyps, thyroid nodule, HTN, and history of benign esophageal stricture complicated by Candida esophagitis status post recent EGD was seen in followup. Tonya Harper underwent EGD on 07/04/2011 which revealed a benign appearing proximal esophageal stricture at 19 cm. This was unable to be traversed with the standard upper endoscope, and the scope was withdrawn and replaced with a pediatric upper endoscope. With simple passage of the pediatric endoscope a mucosal tear was made in the stricture and therefore further dilation was not performed. Distal to stricture she had moderate to severe Candida esophagitis. She was treated successfully with Diflucan returns for followup. She reports that she has noted a significant improvement in her dysphagia, but she continues to have trouble swallowing solids. She reports she is not having as much trouble with food sticking requiring vomiting. She still has to modify her diet. Her appetite remains okay for her without nausea or vomiting. She denies abdominal pain. Bowel habits are normal without blood or melena.  Review of Systems As per history of present illness, otherwise negative  Current Medications, Allergies, Past Medical History, Past Surgical History, Family History and Social History were reviewed in [facility] electronic medical record.     Objective:   Physical Exam BP 140/72  Pulse 60  Ht 5' 4" (1.626 m)  Wt 81 lb (36.741 kg)  BMI 13.90 kg/m2 Constitutional: Well-developed and thin female. No distress.  HEENT: Normocephalic and atraumatic. Oropharynx is clear and moist. No oropharyngeal exudate. Conjunctivae are normal.No scleral icterus.  Neck: Neck supple. Trachea midline.  Cardiovascular: Normal rate,  regular rhythm and intact distal pulses. No M/R/G  Pulmonary/chest: Effort normal and breath sounds normal. No wheezing, rales or rhonchi.  Abdominal: Soft, nontender, scaphoid, nondistended. Bowel sounds active throughout. There are no masses palpable. No hepatosplenomegaly.  Extremities: no clubbing, cyanosis, or edema  Lymphadenopathy: No cervical adenopathy noted.  Neurological: Alert and oriented to person place and time.  Skin: Skin is warm and dry. No rashes noted.  Psychiatric: Normal mood and affect. Behavior is normal.    Assessment & Plan:   66 yo female with PMH of CAD with cardiomyopathy with low EF pending ICD placement, COPD, hypercholesterolemia, colonic polyps, thyroid nodule, HTN, and history of benign esophageal stricture complicated by Candida esophagitis status post recent EGD was seen in followup.  1. Proximal esophageal stricture/Candida esophagitis -- the patient has an impressive proximal esophageal stricture. It was dilated using the pediatric upper endoscope, but this is only to approximately 8 mm. She also had Candida esophagitis which has been treated, and this may explain some of her improvement in her dysphagia. Her stricture is felt secondary to previous esophageal injury with repair which occurred during lung surgery.  We discussed repeat dilation and how this may further improve her dysphagia symptoms. She tolerated the first endoscopy well, and wishes to proceed with repeat dilation. She again knows that it may take a series of dilations to completely improve her symptoms. We will perform this as an outpatient in hospital setting with propofol sedation.  2. History of adenomatous colon polyp -- the patient had 10 adenomatous polyps at her last colonoscopy. This test will be repeated one year from her colonoscopy and a recall is on the books for this.   

## 2011-07-31 ENCOUNTER — Ambulatory Visit: Payer: 59 | Admitting: Cardiology

## 2011-08-13 ENCOUNTER — Ambulatory Visit (HOSPITAL_COMMUNITY)
Admission: RE | Admit: 2011-08-13 | Discharge: 2011-08-13 | Disposition: A | Discharge status: Home / Self Care | Payer: 59 | Source: Ambulatory Visit | Attending: Internal Medicine | Admitting: Internal Medicine

## 2011-08-13 ENCOUNTER — Encounter (HOSPITAL_COMMUNITY): Payer: Self-pay | Admitting: Anesthesiology

## 2011-08-13 ENCOUNTER — Encounter (HOSPITAL_COMMUNITY)
Admission: RE | Disposition: A | Discharge status: Home / Self Care | Payer: Self-pay | Source: Ambulatory Visit | Attending: Internal Medicine

## 2011-08-13 ENCOUNTER — Encounter (HOSPITAL_COMMUNITY): Payer: Self-pay

## 2011-08-13 ENCOUNTER — Ambulatory Visit (HOSPITAL_COMMUNITY): Payer: 59 | Admitting: Anesthesiology

## 2011-08-13 DIAGNOSIS — I10 Essential (primary) hypertension: Secondary | ICD-10-CM | POA: Insufficient documentation

## 2011-08-13 DIAGNOSIS — Z860100 Personal history of colon polyps, unspecified: Secondary | ICD-10-CM | POA: Insufficient documentation

## 2011-08-13 DIAGNOSIS — J449 Chronic obstructive pulmonary disease, unspecified: Secondary | ICD-10-CM | POA: Insufficient documentation

## 2011-08-13 DIAGNOSIS — J4489 Other specified chronic obstructive pulmonary disease: Secondary | ICD-10-CM | POA: Insufficient documentation

## 2011-08-13 DIAGNOSIS — I2510 Atherosclerotic heart disease of native coronary artery without angina pectoris: Secondary | ICD-10-CM | POA: Insufficient documentation

## 2011-08-13 DIAGNOSIS — R634 Abnormal weight loss: Secondary | ICD-10-CM

## 2011-08-13 DIAGNOSIS — B3781 Candidal esophagitis: Secondary | ICD-10-CM | POA: Insufficient documentation

## 2011-08-13 DIAGNOSIS — K222 Esophageal obstruction: Secondary | ICD-10-CM | POA: Insufficient documentation

## 2011-08-13 DIAGNOSIS — K225 Diverticulum of esophagus, acquired: Secondary | ICD-10-CM | POA: Insufficient documentation

## 2011-08-13 DIAGNOSIS — E7800 Pure hypercholesterolemia, unspecified: Secondary | ICD-10-CM | POA: Insufficient documentation

## 2011-08-13 HISTORY — PX: SAVORY DILATION: SHX5439

## 2011-08-13 HISTORY — PX: ESOPHAGOGASTRODUODENOSCOPY: SHX5428

## 2011-08-13 LAB — HEMOGLOBIN AND HEMATOCRIT, BLOOD
HCT: 36.4 % (ref 36.0–46.0)
Hemoglobin: 11.7 g/dL — ABNORMAL LOW (ref 12.0–15.0)

## 2011-08-13 SURGERY — EGD (ESOPHAGOGASTRODUODENOSCOPY)
Anesthesia: Monitor Anesthesia Care

## 2011-08-13 MED ORDER — BUTAMBEN-TETRACAINE-BENZOCAINE 2-2-14 % EX AERO
INHALATION_SPRAY | CUTANEOUS | Status: DC | PRN
  Administered 2011-08-13: 2 via TOPICAL

## 2011-08-13 MED ORDER — PROPOFOL 10 MG/ML IV EMUL
INTRAVENOUS | Status: DC | PRN
  Administered 2011-08-13: 75 ug/kg/min via INTRAVENOUS

## 2011-08-13 MED ORDER — LACTATED RINGERS IV SOLN
INTRAVENOUS | Status: DC
  Administered 2011-08-13: 1000 mL via INTRAVENOUS

## 2011-08-13 NOTE — H&P (View-Only) (Signed)
Subjective:    Patient ID: Tonya Harper, female    DOB: 11-19-1945, 66 y.o.   MRN: [id number]  HPI Tonya Harper is a 66 yo female with PMH of CAD with cardiomyopathy with low EF pending ICD placement, COPD, hypercholesterolemia, colonic polyps, thyroid nodule, HTN, and history of benign esophageal stricture complicated by Candida esophagitis status post recent EGD was seen in followup. Tonya Harper underwent EGD on 07/04/2011 which revealed a benign appearing proximal esophageal stricture at 19 cm. This was unable to be traversed with the standard upper endoscope, and the scope was withdrawn and replaced with a pediatric upper endoscope. With simple passage of the pediatric endoscope a mucosal tear was made in the stricture and therefore further dilation was not performed. Distal to stricture she had moderate to severe Candida esophagitis. She was treated successfully with Diflucan returns for followup. She reports that she has noted a significant improvement in her dysphagia, but she continues to have trouble swallowing solids. She reports she is not having as much trouble with food sticking requiring vomiting. She still has to modify her diet. Her appetite remains okay for her without nausea or vomiting. She denies abdominal pain. Bowel habits are normal without blood or melena.  Review of Systems As per history of present illness, otherwise negative  Current Medications, Allergies, Past Medical History, Past Surgical History, Family History and Social History were reviewed in [employer] record.     Objective:   Physical Exam BP 140/72  Pulse 60  Ht 5\' 4"  (1.626 m)  Wt 81 lb (36.741 kg)  BMI 13.90 kg/m2 Constitutional: Well-developed and thin female. No distress.  HEENT: Normocephalic and atraumatic. Oropharynx is clear and moist. No oropharyngeal exudate. Conjunctivae are normal.No scleral icterus.  Neck: Neck supple. Trachea midline.  Cardiovascular: Normal rate,  regular rhythm and intact distal pulses. No M/R/G  Pulmonary/chest: Effort normal and breath sounds normal. No wheezing, rales or rhonchi.  Abdominal: Soft, nontender, scaphoid, nondistended. Bowel sounds active throughout. There are no masses palpable. No hepatosplenomegaly.  Extremities: no clubbing, cyanosis, or edema  Lymphadenopathy: No cervical adenopathy noted.  Neurological: Alert and oriented to person place and time.  Skin: Skin is warm and dry. No rashes noted.  Psychiatric: Normal mood and affect. Behavior is normal.    Assessment & Plan:   66 yo female with PMH of CAD with cardiomyopathy with low EF pending ICD placement, COPD, hypercholesterolemia, colonic polyps, thyroid nodule, HTN, and history of benign esophageal stricture complicated by Candida esophagitis status post recent EGD was seen in followup.  1. Proximal esophageal stricture/Candida esophagitis -- the patient has an impressive proximal esophageal stricture. It was dilated using the pediatric upper endoscope, but this is only to approximately 8 mm. She also had Candida esophagitis which has been treated, and this may explain some of her improvement in her dysphagia. Her stricture is felt secondary to previous esophageal injury with repair which occurred during lung surgery.  We discussed repeat dilation and how this may further improve her dysphagia symptoms. She tolerated the first endoscopy well, and wishes to proceed with repeat dilation. She again knows that it may take a series of dilations to completely improve her symptoms. We will perform this as an outpatient in hospital setting with propofol sedation.  2. History of adenomatous colon polyp -- the patient had 10 adenomatous polyps at her last colonoscopy. This test will be repeated one year from her colonoscopy and a recall is on the books for this.

## 2011-08-13 NOTE — Op Note (Signed)
[facility] [address] [place], [place]  [address]  ENDOSCOPY PROCEDURE REPORT  PATIENT:  Tonya Harper, Tonya Harper  MR#:  [id number] BIRTHDATE:  May 19, 1945, 66 yrs. old  GENDER:  female ENDOSCOPIST:  Carie Caddy. Pyrtle, MD  PROCEDURE DATE:  08/13/2011 PROCEDURE:  EGD with biopsy, 43239, EGD with dilatation over guidewire ASA CLASS:  Class III INDICATIONS:   known esophageal stricture, dysphagia MEDICATIONS:  MAC sedation, administered by CRNA, See Anesthesia Report. TOPICAL ANESTHETIC:  none  DESCRIPTION OF PROCEDURE:   After the risks benefits and alternatives of the procedure were thoroughly explained, informed consent was obtained.  The Pentax Gastroscope [id number] endoscope was introduced through the mouth and advanced to the second portion of the duodenum, without limitations.  The instrument was slowly withdrawn as the mucosa was fully examined. <<PROCEDUREIMAGES>>  A benign-appearing stricture was found in the proximal esophagus at 19 cm. The previously seen superficial ulceration just proximal to the stricture had healed. Again, the adult upper endoscope would not traverse the stricture, thus the scope was withdrawn and replaced with a pediatric upper endoscope. There was very little resistance with the pediatric endoscope, and the stricture was traversed. Multiple small mouth diverticula were found in the proximal and mid esophagus. There was evidence of possible mild candida esophagitis distal to the stricture. It has improved from last examination. Multiple biopsies were obtained and sent to pathology to exclude ongoing fungal esophageal candidiasis. A guidewire was placed into the stomach, and dilation was performed using the savory dilator. 2 passes were made, the first with a 9 mm savory and the second with a 10 mm savory. Post dilation appearance revealed a small mucosal tear. The stomach was entered and closely examined. The antrum, angularis, and lesser  curvature were well visualized, including a retroflexed view of the cardia and fundus. The stomach wall was normally distensible. The scope passed easily through the pylorus into the duodenum. The duodenal bulb was normal in appearance, as was the post bulbar duodenum. Retroflexed views revealed no abnormalities. The scope was then withdrawn from the patient the procedure completed.  COMPLICATIONS:  None  ENDOSCOPIC IMPRESSION: 1) stricture in the proximal esophagus. Dilated. See above. 2) diverticulosis in the proximal and mid esophagus 3) possible Candida esophagitis in the mid and distal esophagus. Biopsies performed 4) normal stomach 5) Normal duodenum  RECOMMENDATIONS: 1) liquid diet for the remainder of today. Advance diet as tolerated tomorrow. 2) hold aspirin today can resume tomorrow 3) await pathology results. 4) office follow-up in 3-4 weeks to assess response to today's procedure  Carie Caddy. Rhea Belton, MD  CC:  The Patient Raechel Chute, MD  n. Rosalie DoctorCarie Caddy. Pyrtle at 08/13/2011 11:30 AM  [name], [id number]

## 2011-08-13 NOTE — Interval H&P Note (Signed)
History and Physical Interval Note: No new issues since being seen in the clinic. She feels well today. Ready to proceed with repeat EGD with dilation  08/13/2011 10:24 AM  Tonya Harper  has presented today for surgery, with the diagnosis of Esophageal Dysphagia  The various methods of treatment have been discussed with the patient and family. After consideration of risks, benefits and other options for treatment, the patient has consented to  Procedure(s) (LRB): ESOPHAGOGASTRODUODENOSCOPY (EGD) (N/A) BALLOON DILATION () as a surgical intervention .  The patient's history has been reviewed, patient examined, no change in status, stable for surgery.  I have reviewed the patients' chart and labs.  Questions were answered to the patient's satisfaction.     Beverley Fiedler, MD

## 2011-08-13 NOTE — Transfer of Care (Signed)
Immediate Anesthesia Transfer of Care Note  Patient: Tonya Harper  Procedure(s) Performed: Procedure(s) (LRB): ESOPHAGOGASTRODUODENOSCOPY (EGD) (N/A) BALLOON DILATION ()  Patient Location: PACU  Anesthesia Type: MAC  Level of Consciousness: awake and alert   Airway & Oxygen Therapy: Patient Spontanous Breathing and Patient connected to nasal cannula oxygen  Post-op Assessment: Report given to PACU RN and Post -op Vital signs reviewed and stable  Post vital signs: Reviewed and stable  Complications: No apparent anesthesia complications

## 2011-08-13 NOTE — Anesthesia Preprocedure Evaluation (Signed)
Anesthesia Evaluation  Patient identified by MRN, date of birth, ID band Patient awake    Reviewed: Allergy & Precautions, H&P , NPO status , Patient's Chart, lab work & pertinent test results  Airway Mallampati: II TM Distance: <3 FB Neck ROM: Full    Dental No notable dental hx.    Pulmonary COPDformer smoker,  breath sounds clear to auscultation  Pulmonary exam normal       Cardiovascular hypertension, + CAD, + Past MI and +CHF Rhythm:Regular Rate:Normal     Neuro/Psych negative neurological ROS  negative psych ROS   GI/Hepatic negative GI ROS, Neg liver ROS,   Endo/Other  negative endocrine ROS  Renal/GU negative Renal ROS  negative genitourinary   Musculoskeletal negative musculoskeletal ROS (+)   Abdominal   Peds negative pediatric ROS (+)  Hematology negative hematology ROS (+)   Anesthesia Other Findings   Reproductive/Obstetrics negative OB ROS                           Anesthesia Physical Anesthesia Plan  ASA: III  Anesthesia Plan: MAC   Post-op Pain Management:    Induction: Intravenous  Airway Management Planned: Nasal Cannula  Additional Equipment:   Intra-op Plan:   Post-operative Plan:   Informed Consent: I have reviewed the patients History and Physical, chart, labs and discussed the procedure including the risks, benefits and alternatives for the proposed anesthesia with the patient or authorized representative who has indicated his/her understanding and acceptance.     Plan Discussed with: CRNA  Anesthesia Plan Comments:         Anesthesia Quick Evaluation

## 2011-08-13 NOTE — Anesthesia Postprocedure Evaluation (Signed)
  Anesthesia Post-op Note  Patient: Tonya Harper  Procedure(s) Performed: Procedure(s) (LRB): ESOPHAGOGASTRODUODENOSCOPY (EGD) (N/A) SAVORY DILATION (N/A)  Patient Location: PACU  Anesthesia Type: MAC  Level of Consciousness: awake and alert   Airway and Oxygen Therapy: Patient Spontanous Breathing  Post-op Pain: mild  Post-op Assessment: Post-op Vital signs reviewed, Patient's Cardiovascular Status Stable, Respiratory Function Stable, Patent Airway and No signs of Nausea or vomiting  Post-op Vital Signs: stable  Complications: No apparent anesthesia complications

## 2011-08-14 ENCOUNTER — Encounter (HOSPITAL_COMMUNITY): Payer: Self-pay | Admitting: Internal Medicine

## 2011-08-21 ENCOUNTER — Other Ambulatory Visit: Payer: Self-pay | Admitting: *Deleted

## 2011-08-21 MED ORDER — FLUCONAZOLE 100 MG PO TABS: ORAL_TABLET | ORAL | Status: DC

## 2011-09-17 ENCOUNTER — Encounter: Payer: Self-pay | Admitting: Internal Medicine

## 2011-09-18 ENCOUNTER — Ambulatory Visit (INDEPENDENT_AMBULATORY_CARE_PROVIDER_SITE_OTHER): Payer: 59 | Admitting: Internal Medicine

## 2011-09-18 ENCOUNTER — Encounter: Payer: Self-pay | Admitting: Internal Medicine

## 2011-09-18 VITALS — BP 110/52 | HR 62 | Ht 64.0 in | Wt 86.2 lb

## 2011-09-18 DIAGNOSIS — R1310 Dysphagia, unspecified: Secondary | ICD-10-CM

## 2011-09-18 DIAGNOSIS — K222 Esophageal obstruction: Secondary | ICD-10-CM

## 2011-09-18 NOTE — Progress Notes (Signed)
  Subjective:    Patient ID: Tonya Harper, female    DOB: [birth year], 66 y.o.   MRN: [id number]  HPI Tonya Harper is a 66 yo female with PMH of CAD with cardiomyopathy with low EF pending ICD placement, COPD, hypercholesterolemia, colonic polyps, thyroid nodule, HTN, and history of benign esophageal stricture complicated by Candida esophagitis status post recent EGD who is seen in followup. Tonya Harper underwent her second EGD on 08/13/2011 which revealed her esophageal stricture at 19 cm from the incisors. Savory dilation was performed at 10 mm. She also has further evidence of Candida esophagitis in the distal esophagus and was retreated with 21 days of Diflucan therapy. Today she returns and is doing well from a swallowing standpoint. Overall she reports it as a "great improvement". She is now able to E. meats such as pork chops and B. if you choose very well and takes very small bites. She reports her swallowing is by no means perfect but much better than when we started dilation. She completed fluconazole therapy, but continues on Symbicort for her COPD. She is washing with mouthwash after each use of this medication. Her appetite remains good and she denies abdominal pain. She has gained approximately a pound her last several weeks to month. Habits remain regular for her.  Review of Systems As per history of present illness, otherwise negative  Current Medications, Allergies, Past Medical History, Past Surgical History, Family History and Social History were reviewed in [employer] record.     Objective:   Physical Exam BP 110/52  Pulse 62  Ht 5\' 4"  (1.626 m)  Wt 86 lb 3.2 oz (39.1 kg)  BMI 14.80 kg/m2 Constitutional: Well-developed and thin female. No distress.  HEENT: Normocephalic and atraumatic. Oropharynx is clear and moist. No oropharyngeal exudate. Conjunctivae are normal.No scleral icterus.  Neck: Neck supple. Trachea midline.  Cardiovascular: Normal rate,  regular rhythm and intact distal pulses. 2/6 SEM Pulmonary/chest: Effort normal and breath sounds normal. No wheezing, rales or rhonchi.  Abdominal: Soft, nontender, scaphoid, nondistended. Bowel sounds active throughout. There are no masses palpable. No hepatosplenomegaly.  Extremities: no clubbing, cyanosis, or edema  Lymphadenopathy: No cervical adenopathy noted.  Neurological: Alert and oriented to person place and time.  Skin: Skin is warm and dry. No rashes noted.  Psychiatric: Normal mood and affect. Behavior is normal.     Assessment & Plan:  66 yo female with PMH of CAD with cardiomyopathy with low EF pending ICD placement, COPD, hypercholesterolemia, colonic polyps, thyroid nodule, HTN, and history of benign esophageal stricture complicated by Candida esophagitis status post recent EGD was seen in followup.   1. Proximal esophageal stricture/Candida esophagitis --the patient's symptoms have improved with dilation of her esophageal stricture to 10 mm and with treatment of her Candida esophagitis. At this point she is happy with what she is able to eat, but feels that things could still be some better. I agree with this, and likely if we could stretch her stricture to 12-13 mm she could have significant relief of her symptoms. We discussed repeat dilation, and she would like to proceed. I will perform this as an outpatient hospital setting with propofol sedation.  2. History of adenomatous colon polyp -- she had 10 adenomatous colon polyps her last colonoscopy and is on schedule for repeat one year from that date

## 2011-09-18 NOTE — Patient Instructions (Addendum)
You have been scheduled for an endoscopy with propofol. Please follow written instructions given to you at your visit today. If you use inhalers (even only as needed), please bring them with you on the day of your procedure. 

## 2011-10-29 ENCOUNTER — Ambulatory Visit (HOSPITAL_COMMUNITY)
Admission: RE | Admit: 2011-10-29 | Discharge: 2011-10-29 | Disposition: A | Discharge status: Home / Self Care | Payer: 59 | Source: Ambulatory Visit | Attending: Internal Medicine | Admitting: Internal Medicine

## 2011-10-29 ENCOUNTER — Encounter (HOSPITAL_COMMUNITY): Payer: Self-pay | Admitting: Anesthesiology

## 2011-10-29 ENCOUNTER — Ambulatory Visit (HOSPITAL_COMMUNITY): Payer: 59 | Admitting: Anesthesiology

## 2011-10-29 ENCOUNTER — Encounter (HOSPITAL_COMMUNITY): Payer: Self-pay | Admitting: *Deleted

## 2011-10-29 ENCOUNTER — Encounter (HOSPITAL_COMMUNITY)
Admission: RE | Disposition: A | Discharge status: Home / Self Care | Payer: Self-pay | Source: Ambulatory Visit | Attending: Internal Medicine

## 2011-10-29 DIAGNOSIS — K225 Diverticulum of esophagus, acquired: Secondary | ICD-10-CM | POA: Insufficient documentation

## 2011-10-29 DIAGNOSIS — S27819A Unspecified injury of esophagus (thoracic part), initial encounter: Secondary | ICD-10-CM

## 2011-10-29 DIAGNOSIS — K449 Diaphragmatic hernia without obstruction or gangrene: Secondary | ICD-10-CM | POA: Insufficient documentation

## 2011-10-29 DIAGNOSIS — K222 Esophageal obstruction: Secondary | ICD-10-CM

## 2011-10-29 DIAGNOSIS — I509 Heart failure, unspecified: Secondary | ICD-10-CM | POA: Insufficient documentation

## 2011-10-29 DIAGNOSIS — I2510 Atherosclerotic heart disease of native coronary artery without angina pectoris: Secondary | ICD-10-CM | POA: Insufficient documentation

## 2011-10-29 DIAGNOSIS — I10 Essential (primary) hypertension: Secondary | ICD-10-CM | POA: Insufficient documentation

## 2011-10-29 DIAGNOSIS — R1310 Dysphagia, unspecified: Secondary | ICD-10-CM

## 2011-10-29 HISTORY — PX: BALLOON DILATION: SHX5330

## 2011-10-29 SURGERY — ESOPHAGOGASTRODUODENOSCOPY (EGD) WITH PROPOFOL
Anesthesia: Monitor Anesthesia Care

## 2011-10-29 MED ORDER — SODIUM CHLORIDE 0.9 % IV SOLN
INTRAVENOUS | Status: DC
  Administered 2011-10-29: 12:00:00 via INTRAVENOUS

## 2011-10-29 MED ORDER — PROPOFOL 10 MG/ML IV BOLUS
INTRAVENOUS | Status: DC | PRN
  Administered 2011-10-29 (×2): 20 mg via INTRAVENOUS
  Administered 2011-10-29: 10 mg via INTRAVENOUS

## 2011-10-29 MED ORDER — BUTAMBEN-TETRACAINE-BENZOCAINE 2-2-14 % EX AERO
INHALATION_SPRAY | CUTANEOUS | Status: DC | PRN
  Administered 2011-10-29: 2 via TOPICAL

## 2011-10-29 MED ORDER — PROPOFOL 10 MG/ML IV EMUL
INTRAVENOUS | Status: DC | PRN
  Administered 2011-10-29: 140 ug/kg/min via INTRAVENOUS

## 2011-10-29 MED ORDER — KETAMINE HCL 10 MG/ML IJ SOLN
INTRAMUSCULAR | Status: DC | PRN
  Administered 2011-10-29: 15 mg via INTRAVENOUS

## 2011-10-29 SURGICAL SUPPLY — 14 items

## 2011-10-29 NOTE — Preoperative (Signed)
Beta Blockers   Reason not to administer Beta Blockers:Not Applicable 

## 2011-10-29 NOTE — Op Note (Signed)
[facility] [address] [place], [address]   ENDOSCOPY PROCEDURE REPORT  PATIENT: Tonya Harper, Tonya Harper  MR#: [id number] BIRTHDATE: July 13, 1945 , 66  yrs. old GENDER: Female ENDOSCOPIST: Beverley Fiedler, MD ASSISTANT:   Janae Sauce, RN Dorisann Frames, Technician PROCEDURE DATE:  10/29/2011 PROCEDURE:   EGD with balloon dilatation ASA CLASS:   Class III INDICATIONS:dysphagia and follow-up of known proximal esophageal stricture (last dilation 07/2011). MEDICATIONS: MAC sedation, administered by CRNA and See Anesthesia Report. TOPICAL ANESTHETIC:   Cetacaine Spray  DESCRIPTION OF PROCEDURE:   After the risks benefits and alternatives of the procedure were thoroughly explained, informed consent was obtained.  The [id number] ([id number])  endoscope was introduced through the mouth  and advanced to the second portion of the duodenum ,      The instrument was slowly withdrawn as the mucosa was carefully examined.      ESOPHAGUS: A stricture was again found 19 cm from the incisors.  The stenosis was initially not traversable with the adult upper endoscope.  The strictured area of traversed after dilation with minimal resistance.  Multiple non-bleeding diverticula (traction) with small openings were found in the middle third of the esophagus and lower third of the esophagus.   The GE junction was regular and slightly narrowed requiring minimal resistance with upper endoscope. There was a superficial mucosal tear at the GE junction.  STOMACH: A hiatus hernia was found.   The mucosa of the stomach appeared normal.  DUODENUM: The duodenal mucosa showed no abnormalities in the bulb and second portion of the duodenum.  Dilation was then performed at the proximal esophagus  Dilator:Balloon Size:10 mm, 11 mm, and 12 mm  Resistance:moderate Heme:yes  Appearance:satisfactory.  See above  COMPLICATIONS: There were no complications.       ENDOSCOPIC IMPRESSION: 1.    Stricture was found 19 cm from the incisors.  Dilation to 12 mm. 2.   Traction diverticula in the middle third of the esophagus and lower third of the esophagus 3.   Hiatus hernia was found 4.   The mucosa of the stomach appeared normal 5.   The duodenal mucosa showed no abnormalities in the bulb and second portion of the duodenum  RECOMMENDATIONS: 1.  Hold aspirin for 2 additional days. 2.  Continue other current medications. 3.  Office follow-up in 2-4 weeks to assess response to dilation. 4.  Liquid diet the remainder of today.  Can advance diet as tolerated tomorrow.   eSigned:  Beverley Fiedler, MD 10/29/2011 1:52 PM   [id number] Constance Goltz, MD The Patient Lewayne Bunting, MD  PATIENT NAME:  [name], [name] MR#: [id number]

## 2011-10-29 NOTE — Anesthesia Preprocedure Evaluation (Addendum)
Anesthesia Evaluation  Patient identified by MRN, date of birth, ID band Patient awake    Reviewed: Allergy & Precautions, H&P , NPO status , Patient's Chart, lab work & pertinent test results, reviewed documented beta blocker date and time   Airway Mallampati: II TM Distance: >3 FB Neck ROM: Full    Dental  (+) Edentulous Upper, Edentulous Lower and Dental Advisory Given   Pulmonary COPD COPD inhaler,  breath sounds clear to auscultation  Pulmonary exam normal       Cardiovascular hypertension, Pt. on medications and Pt. on home beta blockers + CAD, + Past MI and +CHF + Valvular Problems/Murmurs AI Rhythm:Regular Rate:Normal  - Left ventricle: The cavity size was mildly dilated. Wall thickness was increased in a pattern of mild LVH. Systolic function was severely reduced. The estimated ejection fraction was in the range of 20% to 25%. Global severe hypokinesis. Doppler parameters are consistent with abnormal left ventricular relaxation (grade 1 diastolic dysfunction).  - Aortic valve: There was no stenosis. Mild to moderate    regurgitation.  - Mitral valve: Trivial regurgitation.  - Left atrium: The atrium was mildly dilated.  - Right ventricle: The cavity size was normal. Systolic function was mildly reduced.  - Pulmonary arteries: No complete TR doppler jet was measured so unable to estimate PA systolic pressure. PA diastolic pressure was 15 mmHg.  - Inferior vena cava: The vessel was normal in size; the respirophasic diameter changes were in the normal range (= 50%); findings are consistent with normal central venous pressure.  Impressions:  - Mildly dilated LV with severe systolic dysfunction, EF 20-25%. Global hypokinesis. Mild diastolic dysfunction. The RV appeared normal in size with mild systolic dysfunction. Trivial MR, mild to moderate aortic insufficiency. LV filling pressure did not appear significantly elevated (PA diastolic  pressure 15 mmHg) and RV filling pressure did not appear significantly elevated (normal IVC  size and respirophasic variation).    Neuro/Psych  Headaches, PSYCHIATRIC DISORDERS    GI/Hepatic negative GI ROS, Neg liver ROS, Esophageal stricture    Endo/Other  negative endocrine ROS  Renal/GU negative Renal ROS     Musculoskeletal   Abdominal   Peds  Hematology negative hematology ROS (+)   Anesthesia Other Findings   Reproductive/Obstetrics                       Anesthesia Physical Anesthesia Plan  ASA: IV  Anesthesia Plan: MAC   Post-op Pain Management:    Induction: Intravenous  Airway Management Planned:   Additional Equipment:   Intra-op Plan:   Post-operative Plan:   Informed Consent: I have reviewed the patients History and Physical, chart, labs and discussed the procedure including the risks, benefits and alternatives for the proposed anesthesia with the patient or authorized representative who has indicated his/her understanding and acceptance.   Dental advisory given  Plan Discussed with: CRNA and Surgeon  Anesthesia Plan Comments:         Anesthesia Quick Evaluation

## 2011-10-29 NOTE — Transfer of Care (Signed)
Immediate Anesthesia Transfer of Care Note  Patient: Tonya Harper  Procedure(s) Performed: Procedure(s) (LRB) with comments: ESOPHAGOGASTRODUODENOSCOPY (EGD) WITH PROPOFOL (N/A) - robin/ebp   no xray needed BALLOON DILATION ()  Patient Location: PACU  Anesthesia Type: MAC  Level of Consciousness: awake, alert  and oriented  Airway & Oxygen Therapy: Patient Spontanous Breathing and Patient connected to nasal cannula oxygen  Post-op Assessment: Report given to PACU RN and Post -op Vital signs reviewed and stable  Post vital signs: Reviewed and stable  Complications: No apparent anesthesia complications

## 2011-10-29 NOTE — Anesthesia Postprocedure Evaluation (Signed)
Anesthesia Post Note  Patient: Tonya Harper  Procedure(s) Performed: Procedure(s) (LRB): ESOPHAGOGASTRODUODENOSCOPY (EGD) WITH PROPOFOL (N/A) BALLOON DILATION ()  Anesthesia type: MAC  Patient location: PACU  Post pain: Pain level controlled  Post assessment: Post-op Vital signs reviewed  Last Vitals: BP 178/84  Temp 36.5 C (Oral)  Resp 17  Ht 5\' 4"  (1.626 m)  Wt 86 lb (39.009 kg)  BMI 14.76 kg/m2  SpO2 96%  Post vital signs: Reviewed  Level of consciousness: awake  Complications: No apparent anesthesia complications
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
## 2011-10-30 ENCOUNTER — Encounter (HOSPITAL_COMMUNITY): Payer: Self-pay | Admitting: Internal Medicine

## 2011-11-18 ENCOUNTER — Encounter: Payer: Self-pay | Admitting: Internal Medicine

## 2011-11-20 ENCOUNTER — Encounter: Payer: Self-pay | Admitting: Internal Medicine

## 2011-11-20 ENCOUNTER — Ambulatory Visit (INDEPENDENT_AMBULATORY_CARE_PROVIDER_SITE_OTHER): Payer: 59 | Admitting: Internal Medicine

## 2011-11-20 VITALS — BP 150/60 | HR 60 | Ht 64.0 in | Wt 86.2 lb

## 2011-11-20 DIAGNOSIS — R1314 Dysphagia, pharyngoesophageal phase: Secondary | ICD-10-CM

## 2011-11-20 DIAGNOSIS — R1310 Dysphagia, unspecified: Secondary | ICD-10-CM

## 2011-11-20 DIAGNOSIS — Z860100 Personal history of colon polyps, unspecified: Secondary | ICD-10-CM

## 2011-11-20 DIAGNOSIS — K222 Esophageal obstruction: Secondary | ICD-10-CM

## 2011-11-20 DIAGNOSIS — R1319 Other dysphagia: Secondary | ICD-10-CM

## 2011-11-20 NOTE — Progress Notes (Signed)
  Subjective:    Patient ID: Tonya Harper, female    DOB: 1945-05-27, 66 y.o.   MRN: [id number]  HPI Tonya Harper is a 66 yo female with PMH of CAD with cardiomyopathy with low EF pending ICD placement, COPD, hypercholesterolemia, colonic polyps, thyroid nodule, HTN, and history of benign esophageal stricture complicated by Candida esophagitis status post recent EGD who is seen in followup. She has now undergone 3 upper endoscopies for dilation the last on 10/29/2011. After this exam we are able to dilate her stricture to 12 mm. The standard upper endoscope was able traversed the stricture into the stomach which revealed a hiatus hernia. She reports after this procedure she did have chest soreness for about 48 hours but this resolved. She is doing much better from a swallowing standpoint, is able to swallow pills without difficulty now, he can eat "mostly what I want" .  She has been able to gain about 1 pound but she thinks her weight will continue to increase. She reports a good appetite overall. No issues with her bowel movements including no blood in her stool or melena. She does still have some persistent dysphagia, but this is mild now and at this point she does not desire further dilatation. No fevers or chills. No recent chest pain or shortness of breath.   Review of Systems As per history of present illness, otherwise negative  Current Medications, Allergies, Past Medical History, Past Surgical History, Family History and Social History were reviewed in [employer] record.     Objective:   Physical Exam BP 150/60  Pulse 60  Ht 5\' 4"  (1.626 m)  Wt 86 lb 3.2 oz (39.1 kg)  BMI 14.80 kg/m2 Constitutional: Well-developed and thin female. No distress.  HEENT: Normocephalic and atraumatic.Conjunctivae are normal.No scleral icterus.   Extremities: no clubbing, cyanosis, or edema  Neurological: Alert and oriented to person place and time.  Psychiatric: Normal mood and  affect. Behavior is normal.     Assessment & Plan:   66 yo female with PMH of CAD with cardiomyopathy with low EF pending ICD placement, COPD, hypercholesterolemia, colonic polyps, thyroid nodule, HTN, and history of benign esophageal stricture complicated by Candida esophagitis status post recent EGD who is seen in followup.   1.  Esophageal stricture/esophageal dysmotility -- overall the patient's symptoms have improved with serial dilation. She was also treated for Candida esophagitis in the past. At this point her symptoms have improved to the point where she does not desire further attempts at dilation. I do think there is an element of esophageal dysmotility given her anatomy including esophageal diverticulosis, and this is felt to be irreversible. I do think her esophagus is now large enough to tolerate most foods. We will repeat dilation as symptoms warrant, and she is asked to let us know if her symptoms of dysphagia return and she desires repeat dilation.   2.  History of adenomatous colon polyps -- colonoscopy was performed in April and she is due a repeat in April 2014  Return when necessary

## 2011-11-20 NOTE — Patient Instructions (Addendum)
Follow up with Dr. Pyrtle as needed  

## 2011-12-09 ENCOUNTER — Other Ambulatory Visit: Payer: Self-pay | Admitting: Cardiology

## 2011-12-30 ENCOUNTER — Other Ambulatory Visit: Payer: Self-pay | Admitting: Cardiology

## 2012-01-06 ENCOUNTER — Telehealth: Payer: Self-pay | Admitting: Critical Care Medicine

## 2012-01-06 NOTE — Telephone Encounter (Signed)
Left Message 3x to schd follow up apt. Sent letter 01/06/12. °

## 2012-01-13 ENCOUNTER — Encounter: Payer: Self-pay | Admitting: Critical Care Medicine

## 2012-01-13 ENCOUNTER — Ambulatory Visit (INDEPENDENT_AMBULATORY_CARE_PROVIDER_SITE_OTHER): Payer: 59 | Admitting: Critical Care Medicine

## 2012-01-13 VITALS — BP 130/70 | HR 63 | Temp 98.2°F | Ht 64.0 in | Wt 86.0 lb

## 2012-01-13 DIAGNOSIS — J449 Chronic obstructive pulmonary disease, unspecified: Secondary | ICD-10-CM | POA: Diagnosis not present

## 2012-01-13 DIAGNOSIS — J441 Chronic obstructive pulmonary disease with (acute) exacerbation: Secondary | ICD-10-CM

## 2012-01-13 DIAGNOSIS — J4489 Other specified chronic obstructive pulmonary disease: Secondary | ICD-10-CM

## 2012-01-13 MED ORDER — PREDNISONE 10 MG PO TABS: ORAL_TABLET | ORAL | Status: DC

## 2012-01-13 NOTE — Patient Instructions (Addendum)
Take prednisone 10mg  Take 4 tablets daily for 5 days then stop  (sent downstairs pharmacy) Stay on symbicort, use spacer, work on technique Return 4 months
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
## 2012-01-13 NOTE — Assessment & Plan Note (Signed)
Copd Gold C with mild exacerbation and poor HFA technique Plan Pt instructed up to 75% level on hfa technique Rx prednisone for 5 days Cont symbicort No oxygen needed

## 2012-01-29 HISTORY — PX: COLONOSCOPY: SHX174

## 2012-02-12 ENCOUNTER — Encounter: Payer: Self-pay | Admitting: Cardiology

## 2012-02-12 ENCOUNTER — Ambulatory Visit (INDEPENDENT_AMBULATORY_CARE_PROVIDER_SITE_OTHER): Payer: Medicare Other | Admitting: Cardiology

## 2012-02-12 VITALS — BP 140/70 | HR 62 | Ht 64.0 in | Wt 82.0 lb

## 2012-02-12 DIAGNOSIS — I10 Essential (primary) hypertension: Secondary | ICD-10-CM

## 2012-02-12 DIAGNOSIS — I2510 Atherosclerotic heart disease of native coronary artery without angina pectoris: Secondary | ICD-10-CM

## 2012-02-12 MED ORDER — CARVEDILOL 25 MG PO TABS: 25.0000 mg | ORAL_TABLET | Freq: Two times a day (BID) | ORAL | Status: DC

## 2012-02-12 NOTE — Patient Instructions (Addendum)
Your physician wants you to follow-up in: ONE YEAR WITH DR Jens Som IN [place] You will receive a reminder letter in the mail two months in advance. If you don't receive a letter, please call our office to schedule the follow-up appointment.   INCREASE CARVEDILOL TO 25 MG TWICE DAILY  Your physician recommends that you HAVE LAB WORK TODAY

## 2012-02-12 NOTE — Assessment & Plan Note (Addendum)
Blood pressure controlled. Continue present medications. Check potassium and renal function. 
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
## 2012-02-12 NOTE — Assessment & Plan Note (Signed)
Continue statin. Check lipids and liver. 

## 2012-02-12 NOTE — Assessment & Plan Note (Signed)
Continue ACE inhibitor. Increase carvedilol to 25 mg by mouth twice a day. We again discussed ICD. She understands the risk of sudden death. She will contact us if she is agreeable in the future.

## 2012-02-12 NOTE — Assessment & Plan Note (Signed)
Continue aspirin and statin. 

## 2012-02-13 LAB — LIPID PANEL
Cholesterol: 132 mg/dL (ref 0–200)
HDL: 39 mg/dL — ABNORMAL LOW (ref >39)
LDL Cholesterol: 81 mg/dL (ref 0–99)
Total CHOL/HDL Ratio: 3.4 Ratio
Triglycerides: 58 mg/dL (ref <150)
VLDL: 12 mg/dL (ref 0–40)

## 2012-02-13 LAB — BASIC METABOLIC PANEL WITH GFR
BUN: 8 mg/dL (ref 6–23)
CO2: 30 mEq/L (ref 19–32)
Calcium: 9.3 mg/dL (ref 8.4–10.5)
Chloride: 106 mEq/L (ref 96–112)
Creat: 0.82 mg/dL (ref 0.50–1.10)
GFR, Est African American: 87 mL/min
GFR, Est Non African American: 75 mL/min
Glucose, Bld: 77 mg/dL (ref 70–99)
Potassium: 4 mEq/L (ref 3.5–5.3)
Sodium: 141 mEq/L (ref 135–145)

## 2012-02-13 LAB — HEPATIC FUNCTION PANEL
ALT: <8 U/L (ref 0–35)
AST: 11 U/L (ref 0–37)
Albumin: 3.8 g/dL (ref 3.5–5.2)
Alkaline Phosphatase: 32 U/L — ABNORMAL LOW (ref 39–117)
Bilirubin, Direct: 0.1 mg/dL (ref 0.0–0.3)
Indirect Bilirubin: 0.5 mg/dL (ref 0.0–0.9)
Total Bilirubin: 0.6 mg/dL (ref 0.3–1.2)
Total Protein: 6.9 g/dL (ref 6.0–8.3)

## 2012-02-14 ENCOUNTER — Encounter: Payer: Self-pay | Admitting: *Deleted

## 2012-03-16 ENCOUNTER — Encounter: Payer: Self-pay | Admitting: Internal Medicine

## 2012-03-16 ENCOUNTER — Other Ambulatory Visit: Payer: Self-pay | Admitting: Cardiology

## 2012-03-16 ENCOUNTER — Ambulatory Visit (INDEPENDENT_AMBULATORY_CARE_PROVIDER_SITE_OTHER): Payer: Medicare Other | Admitting: Internal Medicine

## 2012-03-16 VITALS — BP 136/69 | HR 61 | Temp 98.0°F | Resp 20 | Ht 64.0 in | Wt 83.0 lb

## 2012-03-16 DIAGNOSIS — J441 Chronic obstructive pulmonary disease with (acute) exacerbation: Secondary | ICD-10-CM | POA: Diagnosis not present

## 2012-03-16 DIAGNOSIS — R0989 Other specified symptoms and signs involving the circulatory and respiratory systems: Secondary | ICD-10-CM | POA: Diagnosis not present

## 2012-03-16 DIAGNOSIS — R0609 Other forms of dyspnea: Secondary | ICD-10-CM

## 2012-03-16 DIAGNOSIS — R0600 Dyspnea, unspecified: Secondary | ICD-10-CM

## 2012-03-16 DIAGNOSIS — J449 Chronic obstructive pulmonary disease, unspecified: Secondary | ICD-10-CM | POA: Diagnosis not present

## 2012-03-16 DIAGNOSIS — R062 Wheezing: Secondary | ICD-10-CM

## 2012-03-16 MED ORDER — PREDNISONE 20 MG PO TABS: ORAL_TABLET | ORAL | Status: DC

## 2012-03-16 MED ORDER — ALBUTEROL SULFATE (5 MG/ML) 0.5% IN NEBU
5.0000 mg | INHALATION_SOLUTION | Freq: Once | RESPIRATORY_TRACT | Status: AC
  Administered 2012-03-16: 5 mg via RESPIRATORY_TRACT

## 2012-03-16 MED ORDER — ALBUTEROL SULFATE HFA 108 (90 BASE) MCG/ACT IN AERS: INHALATION_SPRAY | RESPIRATORY_TRACT | Status: DC

## 2012-03-16 MED ORDER — AZITHROMYCIN 250 MG PO TABS: ORAL_TABLET | ORAL | Status: DC

## 2012-03-16 MED ORDER — METHYLPREDNISOLONE ACETATE 80 MG/ML IJ SUSP
80.0000 mg | Freq: Once | INTRAMUSCULAR | Status: AC
  Administered 2012-03-16: 80 mg via INTRAMUSCULAR

## 2012-03-16 NOTE — Patient Instructions (Addendum)
See me in office in 3 days
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
## 2012-03-19 ENCOUNTER — Ambulatory Visit: Payer: Medicare Other | Admitting: Internal Medicine

## 2012-05-25 ENCOUNTER — Other Ambulatory Visit: Payer: Self-pay | Admitting: *Deleted

## 2012-05-25 MED ORDER — ALBUTEROL SULFATE HFA 108 (90 BASE) MCG/ACT IN AERS: INHALATION_SPRAY | RESPIRATORY_TRACT | Status: DC

## 2012-05-25 NOTE — Telephone Encounter (Signed)
Refill request insurance requires 3 month supply with refills see request on your desk

## 2012-05-28 ENCOUNTER — Encounter: Payer: Self-pay | Admitting: Critical Care Medicine

## 2012-05-28 ENCOUNTER — Ambulatory Visit (INDEPENDENT_AMBULATORY_CARE_PROVIDER_SITE_OTHER): Payer: Medicare Other | Admitting: Critical Care Medicine

## 2012-05-28 VITALS — BP 128/78 | HR 62 | Temp 98.5°F | Ht 64.0 in | Wt 81.0 lb

## 2012-05-28 DIAGNOSIS — E43 Unspecified severe protein-calorie malnutrition: Secondary | ICD-10-CM | POA: Insufficient documentation

## 2012-05-28 DIAGNOSIS — E441 Mild protein-calorie malnutrition: Secondary | ICD-10-CM | POA: Diagnosis not present

## 2012-05-28 DIAGNOSIS — J449 Chronic obstructive pulmonary disease, unspecified: Secondary | ICD-10-CM | POA: Diagnosis not present

## 2012-05-28 MED ORDER — ALPRAZOLAM 0.25 MG PO TABS: 0.2500 mg | ORAL_TABLET | Freq: Three times a day (TID) | ORAL | Status: DC | PRN

## 2012-05-28 NOTE — Assessment & Plan Note (Signed)
Gold stage C. COPD severe but stable at this time Plan Maintain inhaled medications as prescribed
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
## 2012-05-28 NOTE — Patient Instructions (Addendum)
Use xanax as needed for plane trip No other medication changes USe [employer] nutrition supplement and multivitamins Return 4 months

## 2012-05-28 NOTE — Assessment & Plan Note (Signed)
Significant weight loss over last several years with mild protein calorie malnutrition Plan The patient was advised to use resource supplements in between meals a least 2-3 times daily

## 2012-06-25 ENCOUNTER — Other Ambulatory Visit: Payer: Self-pay | Admitting: Cardiology

## 2012-08-09 ENCOUNTER — Other Ambulatory Visit: Payer: Self-pay | Admitting: Internal Medicine

## 2012-08-10 ENCOUNTER — Telehealth: Payer: Self-pay | Admitting: Critical Care Medicine

## 2012-08-10 DIAGNOSIS — J449 Chronic obstructive pulmonary disease, unspecified: Secondary | ICD-10-CM

## 2012-08-10 DIAGNOSIS — J40 Bronchitis, not specified as acute or chronic: Secondary | ICD-10-CM

## 2012-08-10 NOTE — Telephone Encounter (Signed)
lmtcb x1 for pt. 

## 2012-08-10 NOTE — Telephone Encounter (Signed)
I spoke with the pt and advised that there is not anyone at [facility] today but that we will be there tomorrow. I advised I will let the nurse know so she can leave her a sample in the AM. Carron Curie, CMA

## 2012-08-11 ENCOUNTER — Other Ambulatory Visit: Payer: Self-pay | Admitting: Internal Medicine

## 2012-08-11 MED ORDER — BUDESONIDE-FORMOTEROL FUMARATE 160-4.5 MCG/ACT IN AERO: 2.0000 | INHALATION_SPRAY | Freq: Two times a day (BID) | RESPIRATORY_TRACT | Status: DC

## 2012-08-11 NOTE — Telephone Encounter (Signed)
Pt aware samples placed upfornt in [facility]

## 2012-08-19 ENCOUNTER — Other Ambulatory Visit: Payer: Self-pay | Admitting: *Deleted

## 2012-08-19 DIAGNOSIS — J40 Bronchitis, not specified as acute or chronic: Secondary | ICD-10-CM

## 2012-08-19 DIAGNOSIS — J449 Chronic obstructive pulmonary disease, unspecified: Secondary | ICD-10-CM

## 2012-08-19 MED ORDER — BUDESONIDE-FORMOTEROL FUMARATE 160-4.5 MCG/ACT IN AERO: 2.0000 | INHALATION_SPRAY | Freq: Two times a day (BID) | RESPIRATORY_TRACT | Status: DC

## 2012-08-19 MED ORDER — ALBUTEROL SULFATE HFA 108 (90 BASE) MCG/ACT IN AERS: INHALATION_SPRAY | RESPIRATORY_TRACT | Status: DC

## 2012-08-19 NOTE — Telephone Encounter (Signed)
Needs refill on her Symbicort and Albuterol inhaler.

## 2012-08-19 NOTE — Telephone Encounter (Signed)
Refill request. It looks like Dr Delford Field refilled her symbicort  last week will notify pt that she will need to contact his office for refill on symbicort

## 2012-08-20 ENCOUNTER — Telehealth: Payer: Self-pay | Admitting: Critical Care Medicine

## 2012-08-20 DIAGNOSIS — J40 Bronchitis, not specified as acute or chronic: Secondary | ICD-10-CM

## 2012-08-20 DIAGNOSIS — J449 Chronic obstructive pulmonary disease, unspecified: Secondary | ICD-10-CM

## 2012-08-20 MED ORDER — BUDESONIDE-FORMOTEROL FUMARATE 160-4.5 MCG/ACT IN AERO: 2.0000 | INHALATION_SPRAY | Freq: Two times a day (BID) | RESPIRATORY_TRACT | Status: DC

## 2012-08-20 MED ORDER — ALBUTEROL SULFATE HFA 108 (90 BASE) MCG/ACT IN AERS: 2.0000 | INHALATION_SPRAY | Freq: Four times a day (QID) | RESPIRATORY_TRACT | Status: DC | PRN

## 2012-08-20 NOTE — Telephone Encounter (Signed)
Refills sent and the pt is aware. Carron Curie, CMA

## 2012-09-13 DIAGNOSIS — S139XXA Sprain of joints and ligaments of unspecified parts of neck, initial encounter: Secondary | ICD-10-CM | POA: Diagnosis not present

## 2012-09-13 DIAGNOSIS — S0990XA Unspecified injury of head, initial encounter: Secondary | ICD-10-CM | POA: Diagnosis not present

## 2012-09-13 DIAGNOSIS — M542 Cervicalgia: Secondary | ICD-10-CM | POA: Diagnosis not present

## 2012-09-13 DIAGNOSIS — M545 Low back pain, unspecified: Secondary | ICD-10-CM | POA: Diagnosis not present

## 2012-09-13 DIAGNOSIS — M549 Dorsalgia, unspecified: Secondary | ICD-10-CM | POA: Diagnosis not present

## 2012-09-13 DIAGNOSIS — G319 Degenerative disease of nervous system, unspecified: Secondary | ICD-10-CM | POA: Diagnosis not present

## 2012-09-13 DIAGNOSIS — S0993XA Unspecified injury of face, initial encounter: Secondary | ICD-10-CM | POA: Diagnosis not present

## 2012-09-13 DIAGNOSIS — E049 Nontoxic goiter, unspecified: Secondary | ICD-10-CM | POA: Diagnosis not present

## 2012-09-13 DIAGNOSIS — R51 Headache: Secondary | ICD-10-CM | POA: Diagnosis not present

## 2012-09-13 DIAGNOSIS — K229 Disease of esophagus, unspecified: Secondary | ICD-10-CM | POA: Diagnosis not present

## 2012-09-13 DIAGNOSIS — IMO0002 Reserved for concepts with insufficient information to code with codable children: Secondary | ICD-10-CM | POA: Diagnosis not present

## 2012-09-14 DIAGNOSIS — M545 Low back pain, unspecified: Secondary | ICD-10-CM | POA: Diagnosis not present

## 2012-09-14 DIAGNOSIS — R51 Headache: Secondary | ICD-10-CM | POA: Diagnosis not present

## 2012-09-14 DIAGNOSIS — S0990XA Unspecified injury of head, initial encounter: Secondary | ICD-10-CM | POA: Diagnosis not present

## 2012-09-14 DIAGNOSIS — M549 Dorsalgia, unspecified: Secondary | ICD-10-CM | POA: Diagnosis not present

## 2012-09-14 DIAGNOSIS — S0993XA Unspecified injury of face, initial encounter: Secondary | ICD-10-CM | POA: Diagnosis not present

## 2012-09-14 DIAGNOSIS — IMO0002 Reserved for concepts with insufficient information to code with codable children: Secondary | ICD-10-CM | POA: Diagnosis not present

## 2012-09-14 DIAGNOSIS — M542 Cervicalgia: Secondary | ICD-10-CM | POA: Diagnosis not present

## 2012-10-01 ENCOUNTER — Ambulatory Visit (INDEPENDENT_AMBULATORY_CARE_PROVIDER_SITE_OTHER): Payer: Medicare Other | Admitting: Critical Care Medicine

## 2012-10-01 ENCOUNTER — Encounter: Payer: Self-pay | Admitting: Critical Care Medicine

## 2012-10-01 VITALS — BP 118/68 | HR 66 | Ht 64.0 in | Wt 81.0 lb

## 2012-10-01 DIAGNOSIS — R933 Abnormal findings on diagnostic imaging of other parts of digestive tract: Secondary | ICD-10-CM | POA: Diagnosis not present

## 2012-10-01 DIAGNOSIS — J449 Chronic obstructive pulmonary disease, unspecified: Secondary | ICD-10-CM

## 2012-10-01 DIAGNOSIS — Z23 Encounter for immunization: Secondary | ICD-10-CM | POA: Diagnosis not present

## 2012-10-01 DIAGNOSIS — J9610 Chronic respiratory failure, unspecified whether with hypoxia or hypercapnia: Secondary | ICD-10-CM | POA: Diagnosis not present

## 2012-10-01 NOTE — Assessment & Plan Note (Signed)
Progressive chronic obstructive lung disease with severe combined restrictive and obstructive components Now with exertional hypoxemia as documented on exertional walk test Plan Begin oxygen 2 L rest 3 L exertion Maintain inhaled medications as currently prescribed A flu vaccine was administered
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
## 2012-10-01 NOTE — Patient Instructions (Addendum)
Flu vaccine Stay on symbicort two puff twice daily Start home oxygen 2Liters rest 3Liters exertion. We will call [facility] Return 4 months

## 2012-10-19 ENCOUNTER — Other Ambulatory Visit: Payer: Medicare Other

## 2012-10-19 DIAGNOSIS — R109 Unspecified abdominal pain: Secondary | ICD-10-CM

## 2012-10-19 NOTE — Progress Notes (Unsigned)
Pt presents with bilat flank pain and groin pain

## 2012-10-20 ENCOUNTER — Ambulatory Visit: Payer: Medicare Other | Admitting: Internal Medicine

## 2012-10-21 ENCOUNTER — Ambulatory Visit (INDEPENDENT_AMBULATORY_CARE_PROVIDER_SITE_OTHER): Payer: Medicare Other | Admitting: Internal Medicine

## 2012-10-21 ENCOUNTER — Encounter: Payer: Self-pay | Admitting: Internal Medicine

## 2012-10-21 VITALS — BP 172/75 | HR 57 | Temp 98.5°F | Resp 18 | Wt 80.0 lb

## 2012-10-21 DIAGNOSIS — I2510 Atherosclerotic heart disease of native coronary artery without angina pectoris: Secondary | ICD-10-CM | POA: Diagnosis not present

## 2012-10-21 DIAGNOSIS — L819 Disorder of pigmentation, unspecified: Secondary | ICD-10-CM | POA: Diagnosis not present

## 2012-10-21 DIAGNOSIS — E7800 Pure hypercholesterolemia, unspecified: Secondary | ICD-10-CM | POA: Diagnosis not present

## 2012-10-21 DIAGNOSIS — I5022 Chronic systolic (congestive) heart failure: Secondary | ICD-10-CM

## 2012-10-21 DIAGNOSIS — D6489 Other specified anemias: Secondary | ICD-10-CM | POA: Diagnosis not present

## 2012-10-21 DIAGNOSIS — L259 Unspecified contact dermatitis, unspecified cause: Secondary | ICD-10-CM | POA: Diagnosis not present

## 2012-10-21 DIAGNOSIS — I2589 Other forms of chronic ischemic heart disease: Secondary | ICD-10-CM

## 2012-10-21 DIAGNOSIS — E041 Nontoxic single thyroid nodule: Secondary | ICD-10-CM | POA: Diagnosis not present

## 2012-10-21 DIAGNOSIS — R109 Unspecified abdominal pain: Secondary | ICD-10-CM

## 2012-10-21 DIAGNOSIS — I10 Essential (primary) hypertension: Secondary | ICD-10-CM

## 2012-10-21 DIAGNOSIS — R10A0 Flank pain, unspecified side: Secondary | ICD-10-CM

## 2012-10-21 DIAGNOSIS — J9610 Chronic respiratory failure, unspecified whether with hypoxia or hypercapnia: Secondary | ICD-10-CM

## 2012-10-21 DIAGNOSIS — Z87442 Personal history of urinary calculi: Secondary | ICD-10-CM | POA: Insufficient documentation

## 2012-10-21 DIAGNOSIS — R634 Abnormal weight loss: Secondary | ICD-10-CM | POA: Diagnosis not present

## 2012-10-21 DIAGNOSIS — I428 Other cardiomyopathies: Secondary | ICD-10-CM

## 2012-10-21 DIAGNOSIS — E441 Mild protein-calorie malnutrition: Secondary | ICD-10-CM

## 2012-10-21 LAB — COMPREHENSIVE METABOLIC PANEL
ALT: <8 U/L (ref 0–35)
AST: 11 U/L (ref 0–37)
Albumin: 3.8 g/dL (ref 3.5–5.2)
Alkaline Phosphatase: 32 U/L — ABNORMAL LOW (ref 39–117)
BUN: 5 mg/dL — ABNORMAL LOW (ref 6–23)
CO2: 32 mEq/L (ref 19–32)
Calcium: 9.3 mg/dL (ref 8.4–10.5)
Chloride: 104 mEq/L (ref 96–112)
Creat: 0.85 mg/dL (ref 0.50–1.10)
Glucose, Bld: 82 mg/dL (ref 70–99)
Potassium: 4.4 mEq/L (ref 3.5–5.3)
Sodium: 142 mEq/L (ref 135–145)
Total Bilirubin: 0.7 mg/dL (ref 0.3–1.2)
Total Protein: 7.6 g/dL (ref 6.0–8.3)

## 2012-10-21 LAB — CBC WITH DIFFERENTIAL/PLATELET
Basophils Absolute: 0 10*3/uL (ref 0.0–0.1)
Basophils Relative: 1 % (ref 0–1)
Eosinophils Absolute: 1 10*3/uL — ABNORMAL HIGH (ref 0.0–0.7)
Eosinophils Relative: 11 % — ABNORMAL HIGH (ref 0–5)
HCT: 35.6 % — ABNORMAL LOW (ref 36.0–46.0)
Hemoglobin: 11.3 g/dL — ABNORMAL LOW (ref 12.0–15.0)
Lymphocytes Relative: 36 % (ref 12–46)
Lymphs Abs: 3.2 10*3/uL (ref 0.7–4.0)
MCH: 28.3 pg (ref 26.0–34.0)
MCHC: 31.7 g/dL (ref 30.0–36.0)
MCV: 89.2 fL (ref 78.0–100.0)
Monocytes Absolute: 0.5 10*3/uL (ref 0.1–1.0)
Monocytes Relative: 6 % (ref 3–12)
Neutro Abs: 4.1 10*3/uL (ref 1.7–7.7)
Neutrophils Relative %: 46 % (ref 43–77)
Platelets: 249 10*3/uL (ref 150–400)
RBC: 3.99 MIL/uL (ref 3.87–5.11)
RDW: 13.4 % (ref 11.5–15.5)
WBC: 8.8 10*3/uL (ref 4.0–10.5)

## 2012-10-21 LAB — LIPID PANEL
Cholesterol: 134 mg/dL (ref 0–200)
HDL: 41 mg/dL (ref >39)
LDL Cholesterol: 81 mg/dL (ref 0–99)
Total CHOL/HDL Ratio: 3.3 Ratio
Triglycerides: 59 mg/dL (ref <150)
VLDL: 12 mg/dL (ref 0–40)

## 2012-10-21 LAB — POCT URINALYSIS DIPSTICK
Bilirubin, UA: NEGATIVE
Blood, UA: NEGATIVE
Glucose, UA: NEGATIVE
Ketones, UA: NEGATIVE
Leukocytes, UA: NEGATIVE
Nitrite, UA: NEGATIVE
Protein, UA: NEGATIVE
Spec Grav, UA: 1.01
Urobilinogen, UA: NEGATIVE
pH, UA: 6.5

## 2012-10-21 LAB — TSH: TSH: 0.656 u[IU]/mL (ref 0.350–4.500)

## 2012-10-21 NOTE — Patient Instructions (Addendum)
Will set up appt with dermatologist  Dr Emily Filbert or Dr. Vickii Penna  See me in 2-3 weeks  30 min
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
## 2012-10-23 ENCOUNTER — Encounter: Payer: Self-pay | Admitting: *Deleted

## 2012-10-26 DIAGNOSIS — H903 Sensorineural hearing loss, bilateral: Secondary | ICD-10-CM | POA: Diagnosis not present

## 2012-10-26 DIAGNOSIS — F17200 Nicotine dependence, unspecified, uncomplicated: Secondary | ICD-10-CM | POA: Diagnosis not present

## 2012-10-26 DIAGNOSIS — H905 Unspecified sensorineural hearing loss: Secondary | ICD-10-CM | POA: Diagnosis not present

## 2012-10-26 DIAGNOSIS — J329 Chronic sinusitis, unspecified: Secondary | ICD-10-CM | POA: Diagnosis not present

## 2012-10-26 DIAGNOSIS — H6120 Impacted cerumen, unspecified ear: Secondary | ICD-10-CM | POA: Diagnosis not present

## 2012-10-26 DIAGNOSIS — H9120 Sudden idiopathic hearing loss, unspecified ear: Secondary | ICD-10-CM | POA: Diagnosis not present

## 2012-11-03 ENCOUNTER — Ambulatory Visit (HOSPITAL_BASED_OUTPATIENT_CLINIC_OR_DEPARTMENT_OTHER)
Admission: RE | Admit: 2012-11-03 | Discharge: 2012-11-03 | Disposition: A | Discharge status: Home / Self Care | Payer: Medicare Other | Source: Ambulatory Visit | Attending: Internal Medicine | Admitting: Internal Medicine

## 2012-11-03 ENCOUNTER — Ambulatory Visit (INDEPENDENT_AMBULATORY_CARE_PROVIDER_SITE_OTHER): Payer: Medicare Other | Admitting: Internal Medicine

## 2012-11-03 ENCOUNTER — Encounter: Payer: Self-pay | Admitting: Internal Medicine

## 2012-11-03 VITALS — BP 126/68 | HR 71 | Temp 100.3°F | Resp 22 | Wt 81.0 lb

## 2012-11-03 DIAGNOSIS — Z87891 Personal history of nicotine dependence: Secondary | ICD-10-CM | POA: Insufficient documentation

## 2012-11-03 DIAGNOSIS — R059 Cough, unspecified: Secondary | ICD-10-CM

## 2012-11-03 DIAGNOSIS — R05 Cough: Secondary | ICD-10-CM | POA: Insufficient documentation

## 2012-11-03 DIAGNOSIS — R509 Fever, unspecified: Secondary | ICD-10-CM | POA: Diagnosis not present

## 2012-11-03 DIAGNOSIS — J209 Acute bronchitis, unspecified: Secondary | ICD-10-CM | POA: Diagnosis not present

## 2012-11-03 DIAGNOSIS — J984 Other disorders of lung: Secondary | ICD-10-CM | POA: Diagnosis not present

## 2012-11-03 MED ORDER — METHYLPREDNISOLONE SODIUM SUCC 125 MG IJ SOLR
125.0000 mg | Freq: Once | INTRAMUSCULAR | Status: AC
  Administered 2012-11-03: 125 mg via INTRAMUSCULAR

## 2012-11-03 MED ORDER — PREDNISONE 20 MG PO TABS: ORAL_TABLET | ORAL | Status: DC

## 2012-11-03 MED ORDER — CEFTRIAXONE SODIUM 1 G IJ SOLR
0.5000 g | Freq: Once | INTRAMUSCULAR | Status: AC
  Administered 2012-11-03: 0.5 g via INTRAMUSCULAR

## 2012-11-03 MED ORDER — ALBUTEROL SULFATE (5 MG/ML) 0.5% IN NEBU
5.0000 mg | INHALATION_SOLUTION | Freq: Once | RESPIRATORY_TRACT | Status: AC
  Administered 2012-11-03: 5 mg via RESPIRATORY_TRACT

## 2012-11-03 MED ORDER — AMOXICILLIN-POT CLAVULANATE 500-125 MG PO TABS: ORAL_TABLET | ORAL | Status: DC

## 2012-11-03 MED ORDER — METHYLPREDNISOLONE SODIUM SUCC 125 MG IJ SOLR: 125.0000 mg | Freq: Once | INTRAMUSCULAR | Status: DC

## 2012-11-03 MED ORDER — ACETAMINOPHEN 325 MG PO TABS
650.0000 mg | ORAL_TABLET | Freq: Once | ORAL | Status: AC
  Administered 2012-11-03: 650 mg via ORAL

## 2012-11-03 NOTE — Patient Instructions (Addendum)
Send to pharmacy to pick up medications  Take your prednisone 20 mg tablets  3 tabs at the same time for 3 mornings then 2 tablets at the same time for 3 mornings then 1 tablet each morning for 3 mornings then stop  Pick up at pharmacy  See me at 11:15 Thursday    Use your albuterol  2 inhalations 4 times a day  Pick up your antibiotic at the pharmacy
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
## 2012-11-05 ENCOUNTER — Ambulatory Visit (INDEPENDENT_AMBULATORY_CARE_PROVIDER_SITE_OTHER): Payer: Medicare Other | Admitting: Internal Medicine

## 2012-11-05 ENCOUNTER — Encounter: Payer: Self-pay | Admitting: Internal Medicine

## 2012-11-05 VITALS — BP 136/70 | HR 63 | Temp 97.9°F | Resp 20

## 2012-11-05 DIAGNOSIS — J441 Chronic obstructive pulmonary disease with (acute) exacerbation: Secondary | ICD-10-CM

## 2012-11-05 DIAGNOSIS — J209 Acute bronchitis, unspecified: Secondary | ICD-10-CM | POA: Diagnosis not present

## 2012-11-05 MED ORDER — ALBUTEROL SULFATE (5 MG/ML) 0.5% IN NEBU
5.0000 mg | INHALATION_SOLUTION | Freq: Once | RESPIRATORY_TRACT | Status: AC
  Administered 2012-11-05: 5 mg via RESPIRATORY_TRACT

## 2012-11-05 NOTE — Addendum Note (Signed)
Addended by: Mathews Robinsons on: 11/05/2012 03:38 PM   Modules accepted: Orders
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
## 2012-11-05 NOTE — Patient Instructions (Addendum)
For your blood pressure    Take your lisinopril  40 mg daily  Carvedilol  25 mg take twice a day  Bring all medicines next visit  Even breathing medicines

## 2012-11-11 ENCOUNTER — Encounter: Payer: Self-pay | Admitting: Internal Medicine

## 2012-11-24 ENCOUNTER — Ambulatory Visit (INDEPENDENT_AMBULATORY_CARE_PROVIDER_SITE_OTHER): Payer: Medicare Other | Admitting: Internal Medicine

## 2012-11-24 ENCOUNTER — Other Ambulatory Visit: Payer: Self-pay | Admitting: Internal Medicine

## 2012-11-24 ENCOUNTER — Encounter: Payer: Self-pay | Admitting: Internal Medicine

## 2012-11-24 VITALS — BP 101/54 | HR 66 | Temp 98.5°F | Resp 20 | Wt 80.0 lb

## 2012-11-24 DIAGNOSIS — R509 Fever, unspecified: Secondary | ICD-10-CM | POA: Diagnosis not present

## 2012-11-24 DIAGNOSIS — R21 Rash and other nonspecific skin eruption: Secondary | ICD-10-CM | POA: Diagnosis not present

## 2012-11-24 DIAGNOSIS — J441 Chronic obstructive pulmonary disease with (acute) exacerbation: Secondary | ICD-10-CM | POA: Diagnosis not present

## 2012-11-24 LAB — CBC WITH DIFFERENTIAL/PLATELET
Basophils Absolute: 0 10*3/uL (ref 0.0–0.1)
Basophils Relative: 1 % (ref 0–1)
Eosinophils Absolute: 0.6 10*3/uL (ref 0.0–0.7)
Eosinophils Relative: 9 % — ABNORMAL HIGH (ref 0–5)
HCT: 31.8 % — ABNORMAL LOW (ref 36.0–46.0)
Hemoglobin: 10 g/dL — ABNORMAL LOW (ref 12.0–15.0)
Lymphocytes Relative: 42 % (ref 12–46)
Lymphs Abs: 2.7 10*3/uL (ref 0.7–4.0)
MCH: 27.5 pg (ref 26.0–34.0)
MCHC: 31.4 g/dL (ref 30.0–36.0)
MCV: 87.4 fL (ref 78.0–100.0)
Monocytes Absolute: 0.6 10*3/uL (ref 0.1–1.0)
Monocytes Relative: 9 % (ref 3–12)
Neutro Abs: 2.6 10*3/uL (ref 1.7–7.7)
Neutrophils Relative %: 39 % — ABNORMAL LOW (ref 43–77)
Platelets: 318 10*3/uL (ref 150–400)
RBC: 3.64 MIL/uL — ABNORMAL LOW (ref 3.87–5.11)
RDW: 12.9 % (ref 11.5–15.5)
WBC: 6.5 10*3/uL (ref 4.0–10.5)

## 2012-11-24 LAB — COMPREHENSIVE METABOLIC PANEL
ALT: <8 U/L (ref 0–35)
AST: 9 U/L (ref 0–37)
Albumin: 3.3 g/dL — ABNORMAL LOW (ref 3.5–5.2)
Alkaline Phosphatase: 24 U/L — ABNORMAL LOW (ref 39–117)
BUN: 5 mg/dL — ABNORMAL LOW (ref 6–23)
CO2: 28 mEq/L (ref 19–32)
Calcium: 9 mg/dL (ref 8.4–10.5)
Chloride: 101 mEq/L (ref 96–112)
Creat: 0.74 mg/dL (ref 0.50–1.10)
Glucose, Bld: 90 mg/dL (ref 70–99)
Potassium: 4.4 mEq/L (ref 3.5–5.3)
Sodium: 139 mEq/L (ref 135–145)
Total Bilirubin: 0.6 mg/dL (ref 0.3–1.2)
Total Protein: 6.6 g/dL (ref 6.0–8.3)

## 2012-11-24 LAB — RHEUMATOID FACTOR: Rhuematoid fact SerPl-aCnc: 253 IU/mL — ABNORMAL HIGH (ref <=14)

## 2012-11-24 LAB — SEDIMENTATION RATE: Sed Rate: 74 mm/hr — ABNORMAL HIGH (ref 0–22)
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
## 2012-11-24 NOTE — Patient Instructions (Signed)
Take temperature with thermometer every evening  Get labs today  See me in one week 30 mins

## 2012-11-25 ENCOUNTER — Telehealth: Payer: Self-pay | Admitting: Internal Medicine

## 2012-11-25 DIAGNOSIS — R768 Other specified abnormal immunological findings in serum: Secondary | ICD-10-CM

## 2012-11-25 LAB — RPR

## 2012-11-25 LAB — ANA: Anti Nuclear Antibody(ANA): NEGATIVE

## 2012-11-25 NOTE — Telephone Encounter (Signed)
Spoke with pt regarding lab results.  She reports her temp last pm was 98.1  No fever.    See labs  I am concerned about possible vasculitis,  ?? Hepatitis  ?? lypmphoproliferative process.    Her RF is the highest I have ever seen.  I spoke with Dr. Azzie Roup and he will see pt in am at 9 am.  I gave pt information and advised to see Dr. Dareen Piano in am.  She voices understanding

## 2012-11-26 DIAGNOSIS — R6889 Other general symptoms and signs: Secondary | ICD-10-CM | POA: Diagnosis not present

## 2012-11-26 NOTE — Telephone Encounter (Signed)
Bobbie  Call Dr. Kris Hartmann office a rheumatologist with [facility]  And see if Vernesha kept her appt with him on 10/30  .  Please get office note

## 2012-11-26 NOTE — Telephone Encounter (Signed)
Left VM with medical records request that a copy of the OV notes be faxed to out office. pt did make her appt this am

## 2012-12-01 ENCOUNTER — Encounter: Payer: Self-pay | Admitting: Internal Medicine

## 2012-12-01 ENCOUNTER — Ambulatory Visit (INDEPENDENT_AMBULATORY_CARE_PROVIDER_SITE_OTHER): Payer: Medicare Other | Admitting: Internal Medicine

## 2012-12-01 VITALS — BP 127/67 | HR 67 | Temp 98.4°F | Resp 20 | Wt 80.0 lb

## 2012-12-01 DIAGNOSIS — D649 Anemia, unspecified: Secondary | ICD-10-CM | POA: Diagnosis not present

## 2012-12-01 DIAGNOSIS — R894 Abnormal immunological findings in specimens from other organs, systems and tissues: Secondary | ICD-10-CM | POA: Diagnosis not present

## 2012-12-01 DIAGNOSIS — R768 Other specified abnormal immunological findings in serum: Secondary | ICD-10-CM

## 2012-12-01 LAB — CBC WITH DIFFERENTIAL/PLATELET
Basophils Absolute: 0.1 10*3/uL (ref 0.0–0.1)
Basophils Relative: 1 % (ref 0–1)
Eosinophils Absolute: 0.7 10*3/uL (ref 0.0–0.7)
Eosinophils Relative: 8 % — ABNORMAL HIGH (ref 0–5)
HCT: 32.9 % — ABNORMAL LOW (ref 36.0–46.0)
Hemoglobin: 10.5 g/dL — ABNORMAL LOW (ref 12.0–15.0)
Lymphocytes Relative: 34 % (ref 12–46)
Lymphs Abs: 2.9 10*3/uL (ref 0.7–4.0)
MCH: 28.2 pg (ref 26.0–34.0)
MCHC: 31.9 g/dL (ref 30.0–36.0)
MCV: 88.4 fL (ref 78.0–100.0)
Monocytes Absolute: 0.7 10*3/uL (ref 0.1–1.0)
Monocytes Relative: 8 % (ref 3–12)
Neutro Abs: 4.3 10*3/uL (ref 1.7–7.7)
Neutrophils Relative %: 49 % (ref 43–77)
Platelets: 315 10*3/uL (ref 150–400)
RBC: 3.72 MIL/uL — ABNORMAL LOW (ref 3.87–5.11)
RDW: 13 % (ref 11.5–15.5)
WBC: 8.6 10*3/uL (ref 4.0–10.5)
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
## 2012-12-02 ENCOUNTER — Telehealth: Payer: Self-pay | Admitting: Internal Medicine

## 2012-12-02 ENCOUNTER — Ambulatory Visit (HOSPITAL_BASED_OUTPATIENT_CLINIC_OR_DEPARTMENT_OTHER)
Admission: RE | Admit: 2012-12-02 | Discharge: 2012-12-02 | Disposition: A | Discharge status: Home / Self Care | Payer: Medicare Other | Source: Ambulatory Visit | Attending: Internal Medicine | Admitting: Internal Medicine

## 2012-12-02 DIAGNOSIS — J849 Interstitial pulmonary disease, unspecified: Secondary | ICD-10-CM

## 2012-12-02 DIAGNOSIS — J438 Other emphysema: Secondary | ICD-10-CM | POA: Diagnosis not present

## 2012-12-02 DIAGNOSIS — I2510 Atherosclerotic heart disease of native coronary artery without angina pectoris: Secondary | ICD-10-CM | POA: Diagnosis not present

## 2012-12-02 DIAGNOSIS — J449 Chronic obstructive pulmonary disease, unspecified: Secondary | ICD-10-CM | POA: Diagnosis not present

## 2012-12-02 DIAGNOSIS — I776 Arteritis, unspecified: Secondary | ICD-10-CM

## 2012-12-02 DIAGNOSIS — IMO0002 Reserved for concepts with insufficient information to code with codable children: Secondary | ICD-10-CM

## 2012-12-02 DIAGNOSIS — D596 Hemoglobinuria due to hemolysis from other external causes: Secondary | ICD-10-CM | POA: Diagnosis not present

## 2012-12-02 DIAGNOSIS — J441 Chronic obstructive pulmonary disease with (acute) exacerbation: Secondary | ICD-10-CM

## 2012-12-02 LAB — LACTATE DEHYDROGENASE: LDH: 199 U/L (ref 94–250)

## 2012-12-02 LAB — HAPTOGLOBIN: Haptoglobin: 167 mg/dL (ref 45–215)

## 2012-12-02 NOTE — Telephone Encounter (Signed)
Spoke with Dr. Delford Field this am  I am really unclear as to  The etiology of a vasculitic process.  Will begin evaluation by looking into lung with high resolution non contrast CT

## 2012-12-03 ENCOUNTER — Telehealth: Payer: Self-pay | Admitting: Internal Medicine

## 2012-12-03 ENCOUNTER — Telehealth: Payer: Self-pay | Admitting: *Deleted

## 2012-12-03 DIAGNOSIS — R9389 Abnormal findings on diagnostic imaging of other specified body structures: Secondary | ICD-10-CM

## 2012-12-03 NOTE — Telephone Encounter (Signed)
[facility] Radiology called with CT results. Results in epic

## 2012-12-03 NOTE — Telephone Encounter (Signed)
Spoke with pt and she states she needs to be seen

## 2012-12-03 NOTE — Telephone Encounter (Signed)
Spoke with pt and informed of CT results.    Will set up referrral to GI  Dr. Rhea Belton  Advised to take ensure or boost daily or 3 times  Aweek.  She states sometimes she will get diarrhea if taking nutritional supplement daily.  Advised to try puddings  Will give Integra FE supplement daily  Recheck anemia response in 4-6 weeks with me.  ADvised to keep appt. With rheumatology and GI md

## 2012-12-04 ENCOUNTER — Other Ambulatory Visit: Payer: Self-pay | Admitting: Internal Medicine

## 2012-12-04 DIAGNOSIS — L419 Parapsoriasis, unspecified: Secondary | ICD-10-CM | POA: Diagnosis not present

## 2012-12-04 DIAGNOSIS — I776 Arteritis, unspecified: Secondary | ICD-10-CM | POA: Diagnosis not present

## 2012-12-04 DIAGNOSIS — L988 Other specified disorders of the skin and subcutaneous tissue: Secondary | ICD-10-CM | POA: Diagnosis not present

## 2012-12-04 DIAGNOSIS — R21 Rash and other nonspecific skin eruption: Secondary | ICD-10-CM | POA: Diagnosis not present

## 2012-12-04 NOTE — Telephone Encounter (Signed)
Beverley Fiedler, MD Linna Hoff, RN            We need to set her up for hospital MAC case  jmp

## 2012-12-07 ENCOUNTER — Other Ambulatory Visit: Payer: Self-pay | Admitting: *Deleted

## 2012-12-07 ENCOUNTER — Telehealth: Payer: Self-pay | Admitting: *Deleted

## 2012-12-07 MED ORDER — INTEGRA 62.5-62.5-40-3 MG PO CAPS: 1.0000 | ORAL_CAPSULE | Freq: Every day | ORAL | Status: DC

## 2012-12-07 NOTE — Telephone Encounter (Signed)
Notified pt that her Tonya Harper was sent to her pharmacy

## 2012-12-07 NOTE — Telephone Encounter (Signed)
See Heather's note Do not see this as one of her medications

## 2012-12-07 NOTE — Telephone Encounter (Signed)
Call pt and let her know that I sent Integra iron capusules to her pharmacy  Take one a day

## 2012-12-07 NOTE — Telephone Encounter (Signed)
Tonya Harper called and left message on Friday asking about her Iron Rx that she needs called into [facility] on [address] in [place].

## 2012-12-07 NOTE — Telephone Encounter (Signed)
Medication management

## 2012-12-15 ENCOUNTER — Ambulatory Visit (INDEPENDENT_AMBULATORY_CARE_PROVIDER_SITE_OTHER): Payer: Medicare Other | Admitting: Physician Assistant

## 2012-12-15 ENCOUNTER — Encounter: Payer: Self-pay | Admitting: Physician Assistant

## 2012-12-15 VITALS — BP 100/60 | HR 66 | Ht 64.0 in | Wt 84.0 lb

## 2012-12-15 DIAGNOSIS — K222 Esophageal obstruction: Secondary | ICD-10-CM | POA: Diagnosis not present

## 2012-12-15 DIAGNOSIS — R1314 Dysphagia, pharyngoesophageal phase: Secondary | ICD-10-CM

## 2012-12-15 MED ORDER — FLUCONAZOLE 100 MG PO TABS: 100.0000 mg | ORAL_TABLET | Freq: Every day | ORAL | Status: DC

## 2012-12-15 MED ORDER — OMEPRAZOLE 40 MG PO CPDR: 40.0000 mg | DELAYED_RELEASE_CAPSULE | Freq: Every day | ORAL | Status: DC

## 2012-12-15 NOTE — Patient Instructions (Addendum)
You have been scheduled for an endoscopy with propofol. Please follow written instructions given to you at your visit today. If you use inhalers (even only as needed), please bring them with you on the day of your procedure. Your physician has requested that you go to www.startemmi.com and enter the access code given to you at your visit today. This web site gives a general overview about your procedure. However, you should still follow specific instructions given to you by our office regarding your preparation for the procedure. We have sent the following medications to your pharmacy for you to pick up at your convenience: Omeprazole, Diflucan   CC:  Raechel Chute MD

## 2012-12-15 NOTE — Progress Notes (Addendum)
Subjective:    Patient ID: Tonya Harper, female    DOB: [birth year], 67 y.o.   MRN: [id number]  HPI  Tonya Harper is a 67 year old African American female known to Dr. Rhea Belton who has multiple medical problems. She has oxygen-dependent COPD, chronic congestive heart failure with an EF of around 40% with ischemic cardiomyopathy. She also has history of coronary artery disease, hypertension and an esophageal motility disorder as well as a stricture. She had several endoscopies last year and last had an EGD in October of 2013 at which time she did have a distal esophageal stricture balloon dilated to 12 mm. She has been undergoing workup over the past 4-6 weeks for an illness which initially had onset as a bronchitis. She was treated with steroids and a course of antibiotics but since then has had persistent intermittent low-grade fevers and development of a rash which she says is more prominent when she has the fever. As part of his workup recently she had undergone CT scan of the chest on 12/02/2012. There were no findings to suggest underlying interstitial lung disease. She was found to have diffuse bronchial wall thickening with moderate to severe emphysema and profound circumferential thickening of the entire esophagus which is somewhat masslike in in appearance in inferior lead. Also noted to have 3 vessel coronary artery disease. Patient has not been on any PPI therapy. She denies any odynophagia but says she has noticed increased difficulty in her swallowing over at least the past 3-4 months. She does feel that the previous dilations definitely helped. She says at this point she is having difficulty with liquids and solids though more trouble with meat and bread. She says she chokes occasionally with liquids and sometimes even has to regurgitate liquids. Solid food seemed to stop in the center of her chest and she says often 30-45 minutes after eating she will have to regurgitate. Her weight is down 4-5 pounds  over the past couple of months. She also has developed some hoarseness.    Review of Systems  Constitutional: Positive for fever.  HENT: Positive for trouble swallowing and voice change.   Eyes: Negative.   Respiratory: Positive for cough and shortness of breath.   Cardiovascular: Negative.   Gastrointestinal: Negative.   Endocrine: Negative.   Genitourinary: Negative.   Musculoskeletal: Negative.   Skin: Positive for rash.  Allergic/Immunologic: Negative.   Neurological: Negative.   Hematological: Negative.   Psychiatric/Behavioral: Negative.    Outpatient Prescriptions Prior to Visit  Medication Sig Dispense Refill  . acetaminophen (TYLENOL) 325 MG tablet Take 650 mg by mouth every 6 (six) hours as needed. For pain      . albuterol (PROVENTIL HFA;VENTOLIN HFA) 108 (90 BASE) MCG/ACT inhaler Inhale 2 puffs into the lungs every 6 (six) hours as needed for wheezing.  3 Inhaler  3  . ALPRAZolam (XANAX) 0.25 MG tablet Take 1 tablet (0.25 mg total) by mouth 3 (three) times daily as needed for anxiety.  10 tablet  0  . budesonide-formoterol (SYMBICORT) 160-4.5 MCG/ACT inhaler Inhale 2 puffs into the lungs 2 (two) times daily.  3 Inhaler  3  . carvedilol (COREG) 25 MG tablet Take 1 tablet (25 mg total) by mouth 2 (two) times daily.  180 tablet  3  . clobetasol cream (TEMOVATE) 0.05 % Apply 1 application topically 2 (two) times daily.      . Fe Fum-FePoly-Vit C-Vit B3 (INTEGRA) 62.5-62.5-40-3 MG CAPS Take 1 capsule by mouth daily.  30 capsule  6  .  hydrocortisone-pramoxine (ANALPRAM-HC) 2.5-1 % rectal cream Place 1 applicator rectally 4 (four) times daily as needed.      . hydroquinone 4 % cream Apply topically 2 (two) times daily.      Marland Kitchen lisinopril (PRINIVIL,ZESTRIL) 40 MG tablet TAKE ONE TABLET BY MOUTH DAILY  90 tablet  1  . nitroGLYCERIN (NITROSTAT) 0.4 MG SL tablet Place 0.4 mg under the tongue every 5 (five) minutes as needed. For chest pain      . Oxybutynin (GELNIQUE) 3 (28) %  (MG/ACT) GEL Place onto the skin daily.       . pravastatin (PRAVACHOL) 20 MG tablet TAKE ONE TABLET BY MOUTH DAILY AT BEDTIME  90 tablet  3  . UNABLE TO FIND Inhale into the lungs. (Oxygen) 2-3 liters       No facility-administered medications prior to visit.   Allergies  Allergen Reactions  . Aspirin     nausea and dizziness after taking for one month at a time.. States her physician told her to use the 81 mg vs the 325mg .  . Pneumovax [Pneumococcal Polysaccharides]   . Shellfish Allergy     Medication withdrawal symptoms  . Tdap [Diphth-Acell Pertussis-Tetanus]    Patient Active Problem List   Diagnosis Date Noted  . Elevated rheumatoid factor 12/01/2012  . History of renal stone 10/21/2012  . Chronic respiratory failure 10/01/2012  . Protein-calorie malnutrition, mild 05/28/2012  . Hypertension 06/26/2011  . Hypersensitivity reaction 06/18/2011  . Thyroid nodule, cold 06/11/2011  . Thyroid nodule, hot 06/11/2011  . Esophageal stricture 05/23/2011  . Esophageal injury 04/22/2011  . Weight loss 04/09/2011  . Hemorrhoids, internal, with prolapse & bleeding 02/27/2011  . Personal history of colonic polyps, last colonoscopy 1996 (x6 then) 02/27/2011  . Bruit 11/28/2010  . OTHER SPEC FORMS CHRONIC ISCHEMIC HEART DISEASE 03/28/2010  . COPD Golds C 09/20/2009  . CHRONIC SYSTOLIC HEART FAILURE 05/25/2009  . PURE HYPERCHOLESTEROLEMIA 11/02/2008  . OTHER SPECIFIED ANEMIAS 11/02/2008  . TOBACCO ABUSE 11/02/2008  . CAD 10/28/2008  . CARDIOMYOPATHY 10/28/2008   History  Substance Use Topics  . Smoking status: Former Smoker -- 0.50 packs/day for 40 years    Types: Cigarettes    Quit date: 02/14/2011  . Smokeless tobacco: Never Used  . Alcohol Use: Yes     Comment: socially once or twice per year       Objective:   Physical Exam well-developed chronically ill-appearing very thin African American female in no acute distress, pleasant blood pressure 100/60 pulse 66 height 5 foot  4 weight 84. HEENT; nontraumatic normocephalic EOMI PERRLA sclera anicteric oropharynx is clear there are no plaques or evidence of thrush, Supple; no JVD, Cardiovascular; regular rate and rhythm with S1-S2 she does have a systolic murmur pulmonary decreased breath sounds bilaterally, Abdomen ;soft ,nontender, nondistended, bowel sounds are present there is no palpable mass or hepatosplenomegaly, Rectal; exam not done, Extremities; no clubbing cyanosis or edema skin warm and dry, Psych; mood and affect normal and appropriate.        Assessment & Plan:  #19  67 year old female with COPD generally O2 dependent (though not wearing at today's office visit) #2 recent persistent intermittent fevers and rash etiology unclear workup in progress #3 abnormal esophagus on recent CT scan with diffuse circumferential thickening of the entire esophagus especially inferiorly. Rule out infectious esophagitis i.e. Candidiasis, rule out recurrent stricture motility disorder, or less likely neoplasm #4 coronary artery disease #5 cardiomyopathy with EF approximately 40% #6 weight loss secondary to  above  Plan; will start an empiric course of Diflucan 100 mg by mouth daily x14 days as she is certainly at increased risk with  recent steroids and antibiotics. Schedule for upper endoscopy with possible balloon dilation with Dr. Rhea Belton. Procedure will be scheduled at the hospital with [facility]. Discussed in detail with patient she is agreeable to proceed Start omeprazole 40 mg by mouth every morning.  Addendum: Reviewed and agree with management. Beverley Fiedler, MD

## 2012-12-21 ENCOUNTER — Telehealth: Payer: Self-pay | Admitting: Gastroenterology

## 2012-12-21 NOTE — Telephone Encounter (Signed)
Had to move pt's endoscopy to December 19th. Because there will be no MAC sedation available for pt's origional appointment. Called and spoke to pt. She is good with the date change and we went over instructions again. Pt verbalized understanding.

## 2012-12-22 ENCOUNTER — Other Ambulatory Visit: Payer: Self-pay | Admitting: *Deleted

## 2012-12-23 ENCOUNTER — Other Ambulatory Visit: Payer: Self-pay | Admitting: *Deleted

## 2012-12-23 ENCOUNTER — Telehealth: Payer: Self-pay | Admitting: Critical Care Medicine

## 2012-12-23 MED ORDER — NITROGLYCERIN 0.4 MG SL SUBL: 0.4000 mg | SUBLINGUAL_TABLET | SUBLINGUAL | Status: DC | PRN

## 2012-12-23 NOTE — Telephone Encounter (Signed)
Error.  Wrong doctor///hdp

## 2012-12-29 ENCOUNTER — Telehealth: Payer: Self-pay | Admitting: Internal Medicine

## 2012-12-29 NOTE — Telephone Encounter (Signed)
Pt saw Mike Gip, PA on 12/15/12.

## 2012-12-29 NOTE — Progress Notes (Signed)
Spoke with dr fortune, pt needs cardiac clearance prior to procedure, left message with stacy dr Rhea Belton cma pt needs cardiac clearance prior to 01-15-13 procedure per dr fortune anesthesia.

## 2013-01-04 ENCOUNTER — Encounter (HOSPITAL_COMMUNITY): Payer: Self-pay | Admitting: Pharmacy Technician

## 2013-01-05 ENCOUNTER — Telehealth: Payer: Self-pay | Admitting: Gastroenterology

## 2013-01-05 NOTE — Telephone Encounter (Signed)
Spoke to pt. Told her we have to cancel her scheduled EGD witl balloon dil at [facility] long with Dr. Rhea Belton because cardiac clearance is needed. Pt said she will contact Dr. Jens Som and get back with [place]. Very understanding.

## 2013-01-06 ENCOUNTER — Other Ambulatory Visit: Payer: Self-pay | Admitting: Cardiology

## 2013-01-15 ENCOUNTER — Ambulatory Visit (HOSPITAL_COMMUNITY): Admission: RE | Admit: 2013-01-15 | Payer: Medicare Other | Source: Ambulatory Visit | Admitting: Internal Medicine

## 2013-01-15 ENCOUNTER — Encounter (HOSPITAL_COMMUNITY): Admission: RE | Payer: Self-pay | Source: Ambulatory Visit

## 2013-01-15 SURGERY — ESOPHAGOGASTRODUODENOSCOPY (EGD) WITH ESOPHAGEAL DILATION
Anesthesia: Monitor Anesthesia Care

## 2013-01-26 DIAGNOSIS — L408 Other psoriasis: Secondary | ICD-10-CM | POA: Diagnosis not present

## 2013-02-05 DIAGNOSIS — L408 Other psoriasis: Secondary | ICD-10-CM | POA: Diagnosis not present

## 2013-02-15 ENCOUNTER — Telehealth: Payer: Self-pay | Admitting: Internal Medicine

## 2013-02-15 ENCOUNTER — Other Ambulatory Visit: Payer: Self-pay | Admitting: Cardiology

## 2013-02-15 NOTE — Telephone Encounter (Signed)
Spoke with Dr. Chalmers Cater  02/15/2013 regarding  Non diagnostic thyroid bx and suppressed TSH.  Dr. Chalmers Cater offered pt treatment option with XRT or oral therapy and pt stated she wanted to think about it but never followed with Dr. Chalmers Cater

## 2013-02-17 ENCOUNTER — Telehealth: Payer: Self-pay | Admitting: Internal Medicine

## 2013-02-17 NOTE — Telephone Encounter (Signed)
Spoke with pt and advised to come in for office visit  Will see me in am

## 2013-02-18 ENCOUNTER — Encounter: Payer: Self-pay | Admitting: Internal Medicine

## 2013-02-18 ENCOUNTER — Ambulatory Visit (INDEPENDENT_AMBULATORY_CARE_PROVIDER_SITE_OTHER): Payer: Medicare Other | Admitting: Internal Medicine

## 2013-02-18 ENCOUNTER — Ambulatory Visit (HOSPITAL_BASED_OUTPATIENT_CLINIC_OR_DEPARTMENT_OTHER)
Admission: RE | Admit: 2013-02-18 | Discharge: 2013-02-18 | Disposition: A | Discharge status: Home / Self Care | Payer: Medicare Other | Source: Ambulatory Visit | Attending: Internal Medicine | Admitting: Internal Medicine

## 2013-02-18 VITALS — BP 151/69 | HR 93 | Temp 97.6°F | Resp 18 | Ht 64.0 in | Wt 81.0 lb

## 2013-02-18 DIAGNOSIS — J09X2 Influenza due to identified novel influenza A virus with other respiratory manifestations: Secondary | ICD-10-CM

## 2013-02-18 DIAGNOSIS — J449 Chronic obstructive pulmonary disease, unspecified: Secondary | ICD-10-CM | POA: Diagnosis not present

## 2013-02-18 DIAGNOSIS — R059 Cough, unspecified: Secondary | ICD-10-CM

## 2013-02-18 DIAGNOSIS — J441 Chronic obstructive pulmonary disease with (acute) exacerbation: Secondary | ICD-10-CM | POA: Diagnosis not present

## 2013-02-18 DIAGNOSIS — J4489 Other specified chronic obstructive pulmonary disease: Secondary | ICD-10-CM | POA: Diagnosis not present

## 2013-02-18 DIAGNOSIS — R05 Cough: Secondary | ICD-10-CM

## 2013-02-18 DIAGNOSIS — J9801 Acute bronchospasm: Secondary | ICD-10-CM | POA: Diagnosis not present

## 2013-02-18 DIAGNOSIS — R0989 Other specified symptoms and signs involving the circulatory and respiratory systems: Secondary | ICD-10-CM | POA: Insufficient documentation

## 2013-02-18 LAB — POCT INFLUENZA A/B
Influenza A, POC: POSITIVE
Influenza B, POC: NEGATIVE

## 2013-02-18 MED ORDER — PREDNISONE 20 MG PO TABS: ORAL_TABLET | ORAL | Status: DC

## 2013-02-18 MED ORDER — OSELTAMIVIR PHOSPHATE 75 MG PO CAPS: ORAL_CAPSULE | ORAL | Status: DC

## 2013-02-18 MED ORDER — CEFTRIAXONE SODIUM 1 G IJ SOLR
1.0000 g | Freq: Once | INTRAMUSCULAR | Status: AC
  Administered 2013-02-18: 1 g via INTRAMUSCULAR

## 2013-02-18 MED ORDER — METHYLPREDNISOLONE ACETATE 80 MG/ML IJ SUSP
160.0000 mg | Freq: Once | INTRAMUSCULAR | Status: AC
  Administered 2013-02-18: 160 mg via INTRAMUSCULAR
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
## 2013-02-18 NOTE — Patient Instructions (Signed)
See me on MOnda  Go to ER if any worsening breathing or fever or worsening symptoms

## 2013-02-22 ENCOUNTER — Ambulatory Visit (INDEPENDENT_AMBULATORY_CARE_PROVIDER_SITE_OTHER): Payer: Medicare Other | Admitting: Internal Medicine

## 2013-02-22 ENCOUNTER — Encounter: Payer: Self-pay | Admitting: Internal Medicine

## 2013-02-22 VITALS — BP 150/80 | HR 65 | Resp 16 | Ht 64.0 in | Wt 82.0 lb

## 2013-02-22 DIAGNOSIS — R634 Abnormal weight loss: Secondary | ICD-10-CM

## 2013-02-22 DIAGNOSIS — R946 Abnormal results of thyroid function studies: Secondary | ICD-10-CM

## 2013-02-22 DIAGNOSIS — R05 Cough: Secondary | ICD-10-CM

## 2013-02-22 DIAGNOSIS — R059 Cough, unspecified: Secondary | ICD-10-CM

## 2013-02-22 DIAGNOSIS — R7989 Other specified abnormal findings of blood chemistry: Secondary | ICD-10-CM

## 2013-02-22 DIAGNOSIS — J9801 Acute bronchospasm: Secondary | ICD-10-CM | POA: Diagnosis not present
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
## 2013-02-22 NOTE — Patient Instructions (Signed)
Call for appointment with Dr.  Chalmers Cater the endocrinologist  [phone]  Will schedule CT of abdomen without contrast    See me in 4 weeks  30 mins

## 2013-02-23 ENCOUNTER — Encounter: Payer: Self-pay | Admitting: *Deleted

## 2013-02-23 LAB — COMPREHENSIVE METABOLIC PANEL
ALT: 11 U/L (ref 0–35)
AST: 18 U/L (ref 0–37)
Albumin: 3.9 g/dL (ref 3.5–5.2)
Alkaline Phosphatase: 25 U/L — ABNORMAL LOW (ref 39–117)
BUN: 12 mg/dL (ref 6–23)
CO2: 27 mEq/L (ref 19–32)
Calcium: 9.5 mg/dL (ref 8.4–10.5)
Chloride: 99 mEq/L (ref 96–112)
Creat: 0.71 mg/dL (ref 0.50–1.10)
Glucose, Bld: 91 mg/dL (ref 70–99)
Potassium: 3.8 mEq/L (ref 3.5–5.3)
Sodium: 139 mEq/L (ref 135–145)
Total Bilirubin: 0.9 mg/dL (ref 0.3–1.2)
Total Protein: 7.5 g/dL (ref 6.0–8.3)

## 2013-03-01 ENCOUNTER — Encounter (HOSPITAL_BASED_OUTPATIENT_CLINIC_OR_DEPARTMENT_OTHER): Payer: Self-pay | Admitting: Emergency Medicine

## 2013-03-01 ENCOUNTER — Emergency Department (HOSPITAL_BASED_OUTPATIENT_CLINIC_OR_DEPARTMENT_OTHER): Payer: Medicare Other

## 2013-03-01 ENCOUNTER — Emergency Department (HOSPITAL_BASED_OUTPATIENT_CLINIC_OR_DEPARTMENT_OTHER)
Admission: EM | Admit: 2013-03-01 | Discharge: 2013-03-02 | Disposition: A | Discharge status: Home / Self Care | Payer: Medicare Other | Attending: Emergency Medicine | Admitting: Emergency Medicine

## 2013-03-01 DIAGNOSIS — Z862 Personal history of diseases of the blood and blood-forming organs and certain disorders involving the immune mechanism: Secondary | ICD-10-CM | POA: Insufficient documentation

## 2013-03-01 DIAGNOSIS — J029 Acute pharyngitis, unspecified: Secondary | ICD-10-CM | POA: Insufficient documentation

## 2013-03-01 DIAGNOSIS — Z8619 Personal history of other infectious and parasitic diseases: Secondary | ICD-10-CM | POA: Diagnosis not present

## 2013-03-01 DIAGNOSIS — R34 Anuria and oliguria: Secondary | ICD-10-CM | POA: Insufficient documentation

## 2013-03-01 DIAGNOSIS — D649 Anemia, unspecified: Secondary | ICD-10-CM | POA: Diagnosis not present

## 2013-03-01 DIAGNOSIS — I5022 Chronic systolic (congestive) heart failure: Secondary | ICD-10-CM | POA: Insufficient documentation

## 2013-03-01 DIAGNOSIS — Z87891 Personal history of nicotine dependence: Secondary | ICD-10-CM | POA: Insufficient documentation

## 2013-03-01 DIAGNOSIS — Z79899 Other long term (current) drug therapy: Secondary | ICD-10-CM | POA: Diagnosis not present

## 2013-03-01 DIAGNOSIS — J441 Chronic obstructive pulmonary disease with (acute) exacerbation: Secondary | ICD-10-CM | POA: Insufficient documentation

## 2013-03-01 DIAGNOSIS — Z860100 Personal history of colon polyps, unspecified: Secondary | ICD-10-CM | POA: Insufficient documentation

## 2013-03-01 DIAGNOSIS — Z8639 Personal history of other endocrine, nutritional and metabolic disease: Secondary | ICD-10-CM | POA: Insufficient documentation

## 2013-03-01 DIAGNOSIS — R112 Nausea with vomiting, unspecified: Secondary | ICD-10-CM | POA: Insufficient documentation

## 2013-03-01 DIAGNOSIS — J209 Acute bronchitis, unspecified: Secondary | ICD-10-CM | POA: Diagnosis not present

## 2013-03-01 DIAGNOSIS — R51 Headache: Secondary | ICD-10-CM | POA: Diagnosis not present

## 2013-03-01 DIAGNOSIS — J40 Bronchitis, not specified as acute or chronic: Secondary | ICD-10-CM

## 2013-03-01 DIAGNOSIS — I10 Essential (primary) hypertension: Secondary | ICD-10-CM | POA: Diagnosis not present

## 2013-03-01 DIAGNOSIS — IMO0001 Reserved for inherently not codable concepts without codable children: Secondary | ICD-10-CM | POA: Diagnosis not present

## 2013-03-01 DIAGNOSIS — Z8719 Personal history of other diseases of the digestive system: Secondary | ICD-10-CM | POA: Diagnosis not present

## 2013-03-01 DIAGNOSIS — IMO0002 Reserved for concepts with insufficient information to code with codable children: Secondary | ICD-10-CM | POA: Diagnosis not present

## 2013-03-01 DIAGNOSIS — J449 Chronic obstructive pulmonary disease, unspecified: Secondary | ICD-10-CM | POA: Diagnosis not present

## 2013-03-01 DIAGNOSIS — Z8669 Personal history of other diseases of the nervous system and sense organs: Secondary | ICD-10-CM | POA: Diagnosis not present

## 2013-03-01 DIAGNOSIS — I2510 Atherosclerotic heart disease of native coronary artery without angina pectoris: Secondary | ICD-10-CM | POA: Insufficient documentation

## 2013-03-01 LAB — BASIC METABOLIC PANEL
BUN: 14 mg/dL (ref 6–23)
CO2: 28 mEq/L (ref 19–32)
Calcium: 8.6 mg/dL (ref 8.4–10.5)
Chloride: 100 mEq/L (ref 96–112)
Creatinine, Ser: 0.8 mg/dL (ref 0.50–1.10)
GFR calc Af Amer: 87 mL/min — ABNORMAL LOW (ref >90)
GFR calc non Af Amer: 75 mL/min — ABNORMAL LOW (ref >90)
Glucose, Bld: 102 mg/dL — ABNORMAL HIGH (ref 70–99)
Potassium: 3.5 mEq/L — ABNORMAL LOW (ref 3.7–5.3)
Sodium: 138 mEq/L (ref 137–147)

## 2013-03-01 LAB — CBC
HCT: 33 % — ABNORMAL LOW (ref 36.0–46.0)
Hemoglobin: 10.8 g/dL — ABNORMAL LOW (ref 12.0–15.0)
MCH: 28.9 pg (ref 26.0–34.0)
MCHC: 32.7 g/dL (ref 30.0–36.0)
MCV: 88.2 fL (ref 78.0–100.0)
Platelets: 220 10*3/uL (ref 150–400)
RBC: 3.74 MIL/uL — ABNORMAL LOW (ref 3.87–5.11)
RDW: 13.5 % (ref 11.5–15.5)
WBC: 11.3 10*3/uL — ABNORMAL HIGH (ref 4.0–10.5)

## 2013-03-01 LAB — PRO B NATRIURETIC PEPTIDE: Pro B Natriuretic peptide (BNP): 1145 pg/mL — ABNORMAL HIGH (ref 0–125)

## 2013-03-01 MED ORDER — SODIUM CHLORIDE 0.9 % IV BOLUS (SEPSIS)
500.0000 mL | Freq: Once | INTRAVENOUS | Status: AC
  Administered 2013-03-01: 500 mL via INTRAVENOUS

## 2013-03-01 MED ORDER — ACETAMINOPHEN 500 MG PO TABS
1000.0000 mg | ORAL_TABLET | Freq: Once | ORAL | Status: AC
  Administered 2013-03-01: 1000 mg via ORAL
  Filled 2013-03-01: qty 2

## 2013-03-01 MED ORDER — IPRATROPIUM-ALBUTEROL 0.5-2.5 (3) MG/3ML IN SOLN: 3.0000 mL | RESPIRATORY_TRACT | Status: DC

## 2013-03-01 MED ORDER — IPRATROPIUM-ALBUTEROL 0.5-2.5 (3) MG/3ML IN SOLN
3.0000 mL | Freq: Once | RESPIRATORY_TRACT | Status: AC
  Administered 2013-03-01: 3 mL via RESPIRATORY_TRACT
  Filled 2013-03-01: qty 3

## 2013-03-01 NOTE — ED Notes (Signed)
C/o fever  Up to 102  Cough congestion, and body aches,  Chest buring  Sore throat  Ha, x 2weeks

## 2013-03-01 NOTE — ED Notes (Addendum)
Fever, cough, sore throat and aching all over x 2 weeks. Productive cough with yellow sputum. She had a positive flu swab last week and took a round of Tamaflu.
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
## 2013-03-02 MED ORDER — DOXYCYCLINE HYCLATE 100 MG PO CAPS: 100.0000 mg | ORAL_CAPSULE | Freq: Two times a day (BID) | ORAL | Status: DC

## 2013-03-02 NOTE — Discharge Instructions (Signed)
Please follow up with your doctor in 1-2 days. Please take the antibiotics for your bronchitis.  Bronchitis Bronchitis is inflammation of the airways that extend from the windpipe into the lungs (bronchi). The inflammation often causes mucus to develop, which leads to a cough. If the inflammation becomes severe, it may cause shortness of breath. CAUSES  Bronchitis may be caused by:   Viral infections.   Bacteria.   Cigarette smoke.   Allergens, pollutants, and other irritants.  SIGNS AND SYMPTOMS  The most common symptom of bronchitis is a frequent cough that produces mucus. Other symptoms include:  Fever.   Body aches.   Chest congestion.   Chills.   Shortness of breath.   Sore throat.  DIAGNOSIS  Bronchitis is usually diagnosed through a medical history and physical exam. Tests, such as chest X-rays, are sometimes done to rule out other conditions.  TREATMENT  You may need to avoid contact with whatever caused the problem (smoking, for example). Medicines are sometimes needed. These may include:  Antibiotics. These may be prescribed if the condition is caused by bacteria.  Cough suppressants. These may be prescribed for relief of cough symptoms.   Inhaled medicines. These may be prescribed to help open your airways and make it easier for you to breathe.   Steroid medicines. These may be prescribed for those with recurrent (chronic) bronchitis. HOME CARE INSTRUCTIONS  Get plenty of rest.   Drink enough fluids to keep your urine clear or pale yellow (unless you have a medical condition that requires fluid restriction). Increasing fluids may help thin your secretions and will prevent dehydration.   Only take over-the-counter or prescription medicines as directed by your health care provider.  Only take antibiotics as directed. Make sure you finish them even if you start to feel better.  Avoid secondhand smoke, irritating chemicals, and strong fumes.  These will make bronchitis worse. If you are a smoker, quit smoking. Consider using nicotine gum or skin patches to help control withdrawal symptoms. Quitting smoking will help your lungs heal faster.   Put a cool-mist humidifier in your bedroom at night to moisten the air. This may help loosen mucus. Change the water in the humidifier daily. You can also run the hot water in your shower and sit in the bathroom with the door closed for 5 10 minutes.   Follow up with your health care provider as directed.   Wash your hands frequently to avoid catching bronchitis again or spreading an infection to others.  SEEK MEDICAL CARE IF: Your symptoms do not improve after 1 week of treatment.  SEEK IMMEDIATE MEDICAL CARE IF:  Your fever increases.  You have chills.   You have chest pain.   You have worsening shortness of breath.   You have bloody sputum.  You faint.  You have lightheadedness.  You have a severe headache.   You vomit repeatedly. MAKE SURE YOU:   Understand these instructions.  Will watch your condition.  Will get help right away if you are not doing well or get worse. Document Released: 01/14/2005 Document Revised: 11/04/2012 Document Reviewed: 09/08/2012 [facility] Patient Information 2014 [employer].

## 2013-03-15 ENCOUNTER — Ambulatory Visit (HOSPITAL_BASED_OUTPATIENT_CLINIC_OR_DEPARTMENT_OTHER)
Admission: RE | Admit: 2013-03-15 | Discharge: 2013-03-15 | Disposition: A | Discharge status: Home / Self Care | Payer: Medicare Other | Source: Ambulatory Visit | Attending: Internal Medicine | Admitting: Internal Medicine

## 2013-03-15 ENCOUNTER — Encounter (HOSPITAL_BASED_OUTPATIENT_CLINIC_OR_DEPARTMENT_OTHER): Payer: Self-pay

## 2013-03-15 DIAGNOSIS — R634 Abnormal weight loss: Secondary | ICD-10-CM

## 2013-03-15 DIAGNOSIS — I729 Aneurysm of unspecified site: Secondary | ICD-10-CM | POA: Diagnosis not present

## 2013-03-15 DIAGNOSIS — I714 Abdominal aortic aneurysm, without rupture, unspecified: Secondary | ICD-10-CM | POA: Diagnosis not present

## 2013-03-15 DIAGNOSIS — J984 Other disorders of lung: Secondary | ICD-10-CM | POA: Diagnosis not present

## 2013-03-15 MED ORDER — IOHEXOL 300 MG/ML  SOLN
100.0000 mL | Freq: Once | INTRAMUSCULAR | Status: AC | PRN
  Administered 2013-03-15: 100 mL via INTRAVENOUS

## 2013-03-22 ENCOUNTER — Encounter: Payer: Self-pay | Admitting: Internal Medicine

## 2013-03-22 ENCOUNTER — Ambulatory Visit (HOSPITAL_BASED_OUTPATIENT_CLINIC_OR_DEPARTMENT_OTHER)
Admission: RE | Admit: 2013-03-22 | Discharge: 2013-03-22 | Disposition: A | Discharge status: Home / Self Care | Payer: Medicare Other | Source: Ambulatory Visit | Attending: Internal Medicine | Admitting: Internal Medicine

## 2013-03-22 ENCOUNTER — Ambulatory Visit (INDEPENDENT_AMBULATORY_CARE_PROVIDER_SITE_OTHER): Payer: Medicare Other | Admitting: Internal Medicine

## 2013-03-22 ENCOUNTER — Other Ambulatory Visit: Payer: Self-pay | Admitting: Internal Medicine

## 2013-03-22 VITALS — BP 132/65 | HR 62 | Temp 98.4°F | Resp 18 | Wt 80.0 lb

## 2013-03-22 DIAGNOSIS — N6459 Other signs and symptoms in breast: Secondary | ICD-10-CM | POA: Diagnosis not present

## 2013-03-22 DIAGNOSIS — J984 Other disorders of lung: Secondary | ICD-10-CM | POA: Diagnosis not present

## 2013-03-22 DIAGNOSIS — R922 Inconclusive mammogram: Secondary | ICD-10-CM

## 2013-03-22 DIAGNOSIS — R911 Solitary pulmonary nodule: Secondary | ICD-10-CM

## 2013-03-22 DIAGNOSIS — Z1231 Encounter for screening mammogram for malignant neoplasm of breast: Secondary | ICD-10-CM | POA: Diagnosis not present

## 2013-03-22 DIAGNOSIS — J111 Influenza due to unidentified influenza virus with other respiratory manifestations: Secondary | ICD-10-CM | POA: Diagnosis not present

## 2013-03-22 DIAGNOSIS — Z139 Encounter for screening, unspecified: Secondary | ICD-10-CM | POA: Diagnosis not present
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
## 2013-03-22 NOTE — Patient Instructions (Signed)
Give patient phone number to Dr. Hassan Buckler so patient can reschedule an appointment with her   Stop by [facility] and schedule mammogram and Chest CT

## 2013-03-23 ENCOUNTER — Telehealth: Payer: Self-pay | Admitting: Internal Medicine

## 2013-03-23 NOTE — Telephone Encounter (Signed)
Spoke with pt and informed of chest CT results.

## 2013-04-07 DIAGNOSIS — L4050 Arthropathic psoriasis, unspecified: Secondary | ICD-10-CM | POA: Diagnosis not present

## 2013-04-19 ENCOUNTER — Other Ambulatory Visit: Payer: Self-pay | Admitting: *Deleted

## 2013-04-19 MED ORDER — LISINOPRIL 40 MG PO TABS: 40.0000 mg | ORAL_TABLET | Freq: Every day | ORAL | Status: DC

## 2013-05-25 ENCOUNTER — Other Ambulatory Visit: Payer: Self-pay

## 2013-05-25 MED ORDER — LISINOPRIL 40 MG PO TABS: 40.0000 mg | ORAL_TABLET | Freq: Every day | ORAL | Status: DC

## 2013-05-25 MED ORDER — CARVEDILOL 25 MG PO TABS: ORAL_TABLET | ORAL | Status: DC

## 2013-06-10 ENCOUNTER — Other Ambulatory Visit: Payer: Self-pay

## 2013-06-10 MED ORDER — CARVEDILOL 25 MG PO TABS: ORAL_TABLET | ORAL | Status: DC

## 2013-06-10 MED ORDER — LISINOPRIL 40 MG PO TABS: 40.0000 mg | ORAL_TABLET | Freq: Every day | ORAL | Status: DC

## 2013-06-16 ENCOUNTER — Other Ambulatory Visit: Payer: Self-pay

## 2013-06-16 MED ORDER — LISINOPRIL 40 MG PO TABS: 40.0000 mg | ORAL_TABLET | Freq: Every day | ORAL | Status: DC

## 2013-06-16 MED ORDER — CARVEDILOL 25 MG PO TABS: ORAL_TABLET | ORAL | Status: DC

## 2013-07-08 DIAGNOSIS — R609 Edema, unspecified: Secondary | ICD-10-CM | POA: Diagnosis not present

## 2013-07-08 DIAGNOSIS — L4050 Arthropathic psoriasis, unspecified: Secondary | ICD-10-CM | POA: Diagnosis not present

## 2013-07-15 ENCOUNTER — Other Ambulatory Visit: Payer: Self-pay | Admitting: Critical Care Medicine

## 2013-07-15 NOTE — Telephone Encounter (Signed)
Spoke with the pt  I advised that we do not have samples of any rescue inhalers  I sent a refill to her pharm per her request  Pt overdue for f/u so I have scheduled f/u at the [facility] office for 08/12/13 Nothing further needed

## 2013-07-15 NOTE — Telephone Encounter (Signed)
Pt calling again wanting to know if we have samples of the albuterol inhaler can't refill til 1 of July says that she is going out of town and needs it please advise [phone].Hillery Hunter

## 2013-08-11 ENCOUNTER — Ambulatory Visit (INDEPENDENT_AMBULATORY_CARE_PROVIDER_SITE_OTHER): Payer: Medicare Other | Admitting: Cardiology

## 2013-08-11 ENCOUNTER — Encounter: Payer: Self-pay | Admitting: Cardiology

## 2013-08-11 ENCOUNTER — Encounter: Payer: Self-pay | Admitting: *Deleted

## 2013-08-11 VITALS — BP 152/80 | HR 70 | Ht 64.0 in | Wt 77.0 lb

## 2013-08-11 DIAGNOSIS — I10 Essential (primary) hypertension: Secondary | ICD-10-CM | POA: Diagnosis not present

## 2013-08-11 DIAGNOSIS — I428 Other cardiomyopathies: Secondary | ICD-10-CM

## 2013-08-11 DIAGNOSIS — I429 Cardiomyopathy, unspecified: Secondary | ICD-10-CM

## 2013-08-11 DIAGNOSIS — I2510 Atherosclerotic heart disease of native coronary artery without angina pectoris: Secondary | ICD-10-CM

## 2013-08-11 NOTE — Assessment & Plan Note (Signed)
Continue statin. Check lipids and liver. 

## 2013-08-11 NOTE — Patient Instructions (Signed)
Your physician wants you to follow-up in: [place] will receive a reminder letter in the mail two months in advance. If you don't receive a letter, please call our office to schedule the follow-up appointment.   Your physician has requested that you have an echocardiogram. Echocardiography is a painless test that uses sound waves to create images of your heart. It provides your doctor with information about the size and shape of your heart and how well your heart's chambers and valves are working. This procedure takes approximately one hour. There are no restrictions for this procedure.    Your physician recommends that you return for lab work WHEN FASTING

## 2013-08-11 NOTE — Assessment & Plan Note (Signed)
Continue aspirin and statin. 

## 2013-08-11 NOTE — Assessment & Plan Note (Signed)
Blood pressure mildly elevated today but she has not taken her medicines. She states her systolic typically runs 473. Continue present medications and follow.

## 2013-08-11 NOTE — Assessment & Plan Note (Signed)
Continue ACE inhibitor and beta blocker. Repeat echocardiogram to reassess LV function. ICD has been recommended previously. She states she has not completely declined this but is not willing to proceed at present. She understands the risk of sudden death.
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
## 2013-08-12 ENCOUNTER — Encounter: Payer: Self-pay | Admitting: Critical Care Medicine

## 2013-08-12 ENCOUNTER — Ambulatory Visit (INDEPENDENT_AMBULATORY_CARE_PROVIDER_SITE_OTHER): Payer: Medicare Other | Admitting: Critical Care Medicine

## 2013-08-12 ENCOUNTER — Other Ambulatory Visit: Payer: Self-pay | Admitting: Cardiology

## 2013-08-12 VITALS — BP 114/56 | HR 72 | Temp 99.0°F | Ht 64.0 in | Wt 76.0 lb

## 2013-08-12 DIAGNOSIS — I429 Cardiomyopathy, unspecified: Secondary | ICD-10-CM | POA: Diagnosis not present

## 2013-08-12 DIAGNOSIS — I2510 Atherosclerotic heart disease of native coronary artery without angina pectoris: Secondary | ICD-10-CM

## 2013-08-12 DIAGNOSIS — J449 Chronic obstructive pulmonary disease, unspecified: Secondary | ICD-10-CM | POA: Diagnosis not present

## 2013-08-12 LAB — HEPATIC FUNCTION PANEL
ALT: <8 U/L (ref 0–35)
AST: 15 U/L (ref 0–37)
Albumin: 3.3 g/dL — ABNORMAL LOW (ref 3.5–5.2)
Alkaline Phosphatase: 28 U/L — ABNORMAL LOW (ref 39–117)
Bilirubin, Direct: 0.2 mg/dL (ref 0.0–0.3)
Indirect Bilirubin: 0.5 mg/dL (ref 0.2–1.2)
Total Bilirubin: 0.7 mg/dL (ref 0.2–1.2)
Total Protein: 6.8 g/dL (ref 6.0–8.3)

## 2013-08-12 LAB — LIPID PANEL
Cholesterol: 125 mg/dL (ref 0–200)
HDL: 36 mg/dL — ABNORMAL LOW (ref >39)
LDL Cholesterol: 68 mg/dL (ref 0–99)
Total CHOL/HDL Ratio: 3.5 Ratio
Triglycerides: 103 mg/dL (ref <150)
VLDL: 21 mg/dL (ref 0–40)

## 2013-08-12 MED ORDER — BUDESONIDE-FORMOTEROL FUMARATE 160-4.5 MCG/ACT IN AERO: 2.0000 | INHALATION_SPRAY | Freq: Two times a day (BID) | RESPIRATORY_TRACT | Status: DC

## 2013-08-12 MED ORDER — ALBUTEROL SULFATE HFA 108 (90 BASE) MCG/ACT IN AERS: INHALATION_SPRAY | RESPIRATORY_TRACT | Status: DC

## 2013-08-12 NOTE — Patient Instructions (Signed)
No change in medications We discussed and decided you are not a candidate for prevnar 13 Return 6 months

## 2013-08-12 NOTE — Progress Notes (Signed)
Subjective:    Patient ID: Tonya Harper, female    DOB: [birth year], 68 y.o.   MRN: [id number]  HPI  68 y.o. WF gold C. COPD    CAT Score 08/12/2013 10/01/2012 05/28/2012 01/13/2012  Total CAT Score 19 18 18 25   08/12/2013. Chief Complaint  Patient presents with  . Follow-up    Breathing varies - weather plays a factor in this.  DOE is unchanged.  No cough.   Breathing is better overall.  Has some good and bad days. Not mucus.  No real chest pain.  Notes mild edema in feet Weight continues to go down .  Chokes a lot with esophagus. Had it stretched several times Sees RA MD Ouida Sills .  ? Cause of low grade fevers.  Rash.  ??PMR.  Review of Systems  Constitutional: Positive for appetite change and unexpected weight change. Negative for chills, diaphoresis, activity change and fatigue.  HENT: Positive for trouble swallowing. Negative for congestion, dental problem, ear discharge, facial swelling, hearing loss, mouth sores, nosebleeds, postnasal drip, sinus pressure, sneezing, tinnitus and voice change.   Eyes: Negative for photophobia, discharge, redness, itching and visual disturbance.  Respiratory: Positive for shortness of breath. Negative for apnea, cough, choking, chest tightness and stridor.   Cardiovascular: Negative for palpitations.  Gastrointestinal: Negative for nausea, constipation, blood in stool and abdominal distention.  Genitourinary: Negative for dysuria, urgency, frequency, hematuria, flank pain, decreased urine volume and difficulty urinating.  Musculoskeletal: Negative for arthralgias, back pain, gait problem, joint swelling, myalgias and neck stiffness.  Skin: Negative for color change and pallor.  Neurological: Negative for dizziness, tremors, seizures, syncope, speech difficulty, weakness, light-headedness and numbness.  Hematological: Negative for adenopathy. Does not bruise/bleed easily.  Psychiatric/Behavioral: Negative for confusion, sleep disturbance, dysphoric  mood and agitation. The patient is not nervous/anxious.        Objective:   Physical Exam  Filed Vitals:   08/12/13 0956  BP: 114/56  Pulse: 72  Temp: 99 F (37.2 C)  TempSrc: Oral  Height: 5\' 4"  (1.626 m)  Weight: 76 lb (34.473 kg)  SpO2: 94%    Gen: Pleasant, thin, in no distress,  normal affect  ENT: No lesions,  mouth clear,  oropharynx clear, no postnasal drip  Neck: No JVD, no TMG, no carotid bruits  Lungs: No use of accessory muscles, no dullness to percussion,distant BS without rales or rhonchi  Cardiovascular: RRR, heart sounds normal, no murmur or gallops, no peripheral edema  Abdomen: soft and NT, no HSM,  BS normal  Musculoskeletal: No deformities, no cyanosis or clubbing  Neuro: alert, non focal  Skin: Warm, no lesions or rashes          Assessment & Plan:   COPD Golds C Gold stage C. COPD Stable at this time Plan Maintain inhaled medications as prescribed The patient declined a Prevnar 13 vaccine    Updated Medication List Outpatient Encounter Prescriptions as of 08/12/2013  Medication Sig  . acetaminophen (TYLENOL) 325 MG tablet Take 650 mg by mouth every 6 (six) hours as needed for mild pain or moderate pain. For pain  . albuterol (PROVENTIL HFA) 108 (90 BASE) MCG/ACT inhaler INHALE 2 PUFFS INTO THE LUNGS EVERY 6 (SIX) HOURS AS NEEDED FOR WHEEZING.  . budesonide-formoterol (SYMBICORT) 160-4.5 MCG/ACT inhaler Inhale 2 puffs into the lungs 2 (two) times daily.  . carvedilol (COREG) 25 MG tablet TAKE 1 TABLET BY MOUTH TWICE A DAY  . clobetasol cream (TEMOVATE) 4.70 % Apply 1 application topically 2 (  two) times daily.  . Fe Fum-FePoly-Vit C-Vit B3 (INTEGRA) 62.5-62.5-40-3 MG CAPS Take 1 capsule by mouth daily as needed.   . folic acid (FOLVITE) 1 MG tablet Take 1 mg by mouth daily.  . hydroquinone 4 % cream Apply 1 application topically 2 (two) times daily.   Marland Kitchen lisinopril (PRINIVIL,ZESTRIL) 40 MG tablet Take 1 tablet (40 mg total) by mouth  at bedtime.  . nitroGLYCERIN (NITROSTAT) 0.4 MG SL tablet Place 0.4 mg under the tongue every 5 (five) minutes as needed for chest pain. For chest pain  . omeprazole (PRILOSEC) 40 MG capsule Take 40 mg by mouth daily.  Marland Kitchen OTEZLA 30 MG TABS Take 1 tablet by mouth 2 (two) times daily.  . Oxybutynin (GELNIQUE) 3 (28) % (MG/ACT) GEL Place 1 application onto the skin daily.   . pravastatin (PRAVACHOL) 20 MG tablet Take 20 mg by mouth at bedtime.  . predniSONE (DELTASONE) 10 MG tablet Take 10 mg by mouth as needed.   Marland Kitchen UNABLE TO FIND Inhale into the lungs daily as needed (shortness of breath). (Oxygen) 2-3 liters  . [DISCONTINUED] albuterol (PROVENTIL HFA;VENTOLIN HFA) 108 (90 BASE) MCG/ACT inhaler Inhale 2 puffs into the lungs every 6 (six) hours as needed for wheezing or shortness of breath.  . [DISCONTINUED] budesonide-formoterol (SYMBICORT) 160-4.5 MCG/ACT inhaler Inhale 2 puffs into the lungs 2 (two) times daily.  . [DISCONTINUED] PROVENTIL HFA 108 (90 BASE) MCG/ACT inhaler INHALE 2 PUFFS INTO THE LUNGS EVERY 6 (SIX) HOURS AS NEEDED FOR WHEEZING.

## 2013-08-12 NOTE — Assessment & Plan Note (Signed)
Gold stage C. COPD Stable at this time Plan Maintain inhaled medications as prescribed The patient declined a Prevnar 13 vaccine

## 2013-08-13 ENCOUNTER — Encounter: Payer: Self-pay | Admitting: *Deleted

## 2013-08-22 ENCOUNTER — Other Ambulatory Visit: Payer: Self-pay | Admitting: Critical Care Medicine

## 2013-08-23 ENCOUNTER — Other Ambulatory Visit: Payer: Self-pay

## 2013-08-23 ENCOUNTER — Encounter: Payer: Self-pay | Admitting: Cardiology

## 2013-08-23 ENCOUNTER — Ambulatory Visit (HOSPITAL_COMMUNITY): Payer: Medicare Other | Attending: Cardiology | Admitting: Radiology

## 2013-08-23 DIAGNOSIS — I429 Cardiomyopathy, unspecified: Secondary | ICD-10-CM

## 2013-08-23 DIAGNOSIS — I080 Rheumatic disorders of both mitral and aortic valves: Secondary | ICD-10-CM | POA: Insufficient documentation

## 2013-08-23 DIAGNOSIS — I428 Other cardiomyopathies: Secondary | ICD-10-CM | POA: Diagnosis not present

## 2013-08-23 MED ORDER — PRAVASTATIN SODIUM 20 MG PO TABS: 20.0000 mg | ORAL_TABLET | Freq: Every day | ORAL | Status: DC

## 2013-08-23 NOTE — Telephone Encounter (Signed)
Returning call about he echo results .Marland KitchenPlease call  Thanks

## 2013-08-23 NOTE — Telephone Encounter (Signed)
This encounter was created in error - please disregard.

## 2013-08-23 NOTE — Telephone Encounter (Signed)
Symbicort 160 rx sent on 08/12/13 #1 x 11. Called [facility], spoke with Autumn. Was advised they did receive this rx, but pt is requesitng a 90 day supply. Advised would send through escribe. She verbalized understanding. Nothing further needed.

## 2013-08-23 NOTE — Progress Notes (Signed)
Echocardiogram performed.  

## 2013-09-02 DIAGNOSIS — L4050 Arthropathic psoriasis, unspecified: Secondary | ICD-10-CM | POA: Diagnosis not present

## 2013-09-07 ENCOUNTER — Other Ambulatory Visit: Payer: Self-pay

## 2013-09-07 MED ORDER — LISINOPRIL 40 MG PO TABS: 40.0000 mg | ORAL_TABLET | Freq: Every day | ORAL | Status: DC

## 2013-10-08 ENCOUNTER — Other Ambulatory Visit: Payer: Self-pay | Admitting: *Deleted

## 2013-10-08 MED ORDER — CARVEDILOL 25 MG PO TABS: ORAL_TABLET | ORAL | Status: DC

## 2013-10-11 ENCOUNTER — Other Ambulatory Visit: Payer: Self-pay

## 2013-10-11 MED ORDER — CARVEDILOL 25 MG PO TABS: ORAL_TABLET | ORAL | Status: DC

## 2013-10-20 ENCOUNTER — Telehealth: Payer: Self-pay | Admitting: Critical Care Medicine

## 2013-10-20 ENCOUNTER — Ambulatory Visit (INDEPENDENT_AMBULATORY_CARE_PROVIDER_SITE_OTHER): Payer: Medicare Other | Admitting: *Deleted

## 2013-10-20 VITALS — BP 137/65 | HR 55 | Temp 98.2°F | Resp 16 | Wt 80.0 lb

## 2013-10-20 DIAGNOSIS — Z23 Encounter for immunization: Secondary | ICD-10-CM | POA: Diagnosis not present

## 2013-10-20 MED ORDER — BUDESONIDE-FORMOTEROL FUMARATE 160-4.5 MCG/ACT IN AERO: 2.0000 | INHALATION_SPRAY | Freq: Two times a day (BID) | RESPIRATORY_TRACT | Status: DC

## 2013-10-20 NOTE — Telephone Encounter (Signed)
Called spoke with pt. She is requesting to pick up samples of symbicort in [facility] office. Pt aware we had no samples of proair. Nothing further needed

## 2013-11-23 ENCOUNTER — Telehealth: Payer: Self-pay | Admitting: Critical Care Medicine

## 2013-11-23 NOTE — Telephone Encounter (Signed)
[id number] x1 

## 2013-11-24 MED ORDER — ALBUTEROL SULFATE HFA 108 (90 BASE) MCG/ACT IN AERS: INHALATION_SPRAY | RESPIRATORY_TRACT | Status: DC

## 2013-11-24 NOTE — Telephone Encounter (Signed)
Pt aware that I have sent 90 day supply to [facility] on file and confirmed the pharmacy as well. Pt is aware that our office will contact her for follow up OV with PW that is due in 01-2014 as soon as the schedule is out. Nothing more needed at this time.

## 2013-12-09 DIAGNOSIS — L409 Psoriasis, unspecified: Secondary | ICD-10-CM | POA: Diagnosis not present

## 2013-12-09 DIAGNOSIS — L4050 Arthropathic psoriasis, unspecified: Secondary | ICD-10-CM | POA: Diagnosis not present

## 2014-01-18 ENCOUNTER — Emergency Department (HOSPITAL_BASED_OUTPATIENT_CLINIC_OR_DEPARTMENT_OTHER)
Admission: EM | Admit: 2014-01-18 | Discharge: 2014-01-18 | Disposition: A | Discharge status: Home / Self Care | Payer: Medicare Other | Attending: Emergency Medicine | Admitting: Emergency Medicine

## 2014-01-18 ENCOUNTER — Encounter (HOSPITAL_BASED_OUTPATIENT_CLINIC_OR_DEPARTMENT_OTHER): Payer: Self-pay | Admitting: Emergency Medicine

## 2014-01-18 ENCOUNTER — Emergency Department (HOSPITAL_BASED_OUTPATIENT_CLINIC_OR_DEPARTMENT_OTHER): Payer: Medicare Other

## 2014-01-18 DIAGNOSIS — Z87891 Personal history of nicotine dependence: Secondary | ICD-10-CM | POA: Diagnosis not present

## 2014-01-18 DIAGNOSIS — Z86010 Personal history of colonic polyps: Secondary | ICD-10-CM | POA: Diagnosis not present

## 2014-01-18 DIAGNOSIS — S93401A Sprain of unspecified ligament of right ankle, initial encounter: Secondary | ICD-10-CM | POA: Diagnosis not present

## 2014-01-18 DIAGNOSIS — X58XXXA Exposure to other specified factors, initial encounter: Secondary | ICD-10-CM | POA: Diagnosis not present

## 2014-01-18 DIAGNOSIS — Z7951 Long term (current) use of inhaled steroids: Secondary | ICD-10-CM | POA: Diagnosis not present

## 2014-01-18 DIAGNOSIS — Z79899 Other long term (current) drug therapy: Secondary | ICD-10-CM | POA: Diagnosis not present

## 2014-01-18 DIAGNOSIS — I5022 Chronic systolic (congestive) heart failure: Secondary | ICD-10-CM | POA: Diagnosis not present

## 2014-01-18 DIAGNOSIS — Z7952 Long term (current) use of systemic steroids: Secondary | ICD-10-CM | POA: Insufficient documentation

## 2014-01-18 DIAGNOSIS — Y9289 Other specified places as the place of occurrence of the external cause: Secondary | ICD-10-CM | POA: Diagnosis not present

## 2014-01-18 DIAGNOSIS — Z8719 Personal history of other diseases of the digestive system: Secondary | ICD-10-CM | POA: Insufficient documentation

## 2014-01-18 DIAGNOSIS — I2510 Atherosclerotic heart disease of native coronary artery without angina pectoris: Secondary | ICD-10-CM | POA: Diagnosis not present

## 2014-01-18 DIAGNOSIS — Y998 Other external cause status: Secondary | ICD-10-CM | POA: Diagnosis not present

## 2014-01-18 DIAGNOSIS — D649 Anemia, unspecified: Secondary | ICD-10-CM | POA: Diagnosis not present

## 2014-01-18 DIAGNOSIS — Y9389 Activity, other specified: Secondary | ICD-10-CM | POA: Insufficient documentation

## 2014-01-18 DIAGNOSIS — I10 Essential (primary) hypertension: Secondary | ICD-10-CM | POA: Insufficient documentation

## 2014-01-18 DIAGNOSIS — H9190 Unspecified hearing loss, unspecified ear: Secondary | ICD-10-CM | POA: Diagnosis not present

## 2014-01-18 DIAGNOSIS — J449 Chronic obstructive pulmonary disease, unspecified: Secondary | ICD-10-CM | POA: Insufficient documentation

## 2014-01-18 DIAGNOSIS — S99911A Unspecified injury of right ankle, initial encounter: Secondary | ICD-10-CM | POA: Diagnosis present

## 2014-01-18 DIAGNOSIS — Z8619 Personal history of other infectious and parasitic diseases: Secondary | ICD-10-CM | POA: Diagnosis not present

## 2014-01-18 DIAGNOSIS — W19XXXA Unspecified fall, initial encounter: Secondary | ICD-10-CM

## 2014-01-18 DIAGNOSIS — I509 Heart failure, unspecified: Secondary | ICD-10-CM | POA: Diagnosis not present

## 2014-01-18 MED ORDER — HYDROCODONE-ACETAMINOPHEN 5-325 MG PO TABS
1.0000 | ORAL_TABLET | Freq: Once | ORAL | Status: AC
  Administered 2014-01-18: 1 via ORAL
  Filled 2014-01-18: qty 1

## 2014-01-18 MED ORDER — HYDROCODONE-ACETAMINOPHEN 5-325 MG PO TABS: 1.0000 | ORAL_TABLET | Freq: Four times a day (QID) | ORAL | Status: DC | PRN

## 2014-01-18 NOTE — Discharge Instructions (Signed)

## 2014-01-18 NOTE — ED Notes (Signed)
Patient transported to X-ray 
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
## 2014-01-18 NOTE — ED Notes (Signed)
Patient stepped wrong 2 weeks ago and turned her right ankle. Remains in pain

## 2014-04-20 ENCOUNTER — Telehealth: Payer: Self-pay | Admitting: Internal Medicine

## 2014-04-20 DIAGNOSIS — R911 Solitary pulmonary nodule: Secondary | ICD-10-CM

## 2014-04-20 NOTE — Telephone Encounter (Signed)
Tonya Harper  Call pt and advise she is due for the one year follow up of the lung nodules in her right lung.  CT order in chart for [facility]  Route back with date this will be done thanks

## 2014-04-26 NOTE — Telephone Encounter (Signed)
Tonya Harper is set up for the repeat CT scan of chest on 05/03/14 @ 8am. Pt is aware- eh

## 2014-05-03 ENCOUNTER — Ambulatory Visit (HOSPITAL_BASED_OUTPATIENT_CLINIC_OR_DEPARTMENT_OTHER)
Admission: RE | Admit: 2014-05-03 | Discharge: 2014-05-03 | Disposition: A | Discharge status: Home / Self Care | Payer: Medicare Other | Source: Ambulatory Visit | Attending: Internal Medicine | Admitting: Internal Medicine

## 2014-05-03 DIAGNOSIS — R911 Solitary pulmonary nodule: Secondary | ICD-10-CM | POA: Diagnosis not present

## 2014-05-03 DIAGNOSIS — I2510 Atherosclerotic heart disease of native coronary artery without angina pectoris: Secondary | ICD-10-CM | POA: Diagnosis not present

## 2014-05-03 DIAGNOSIS — Z87891 Personal history of nicotine dependence: Secondary | ICD-10-CM | POA: Insufficient documentation

## 2014-05-03 DIAGNOSIS — J449 Chronic obstructive pulmonary disease, unspecified: Secondary | ICD-10-CM | POA: Diagnosis not present

## 2014-05-03 DIAGNOSIS — E079 Disorder of thyroid, unspecified: Secondary | ICD-10-CM | POA: Diagnosis not present

## 2014-05-03 DIAGNOSIS — R933 Abnormal findings on diagnostic imaging of other parts of digestive tract: Secondary | ICD-10-CM | POA: Diagnosis not present

## 2014-05-03 DIAGNOSIS — I10 Essential (primary) hypertension: Secondary | ICD-10-CM | POA: Diagnosis not present

## 2014-05-04 ENCOUNTER — Telehealth: Payer: Self-pay | Admitting: Internal Medicine

## 2014-05-04 NOTE — Telephone Encounter (Signed)
Left message on phone number provided in chart to call me regarding CT scan resulst

## 2014-05-04 NOTE — Telephone Encounter (Signed)
Spoke with Tonya Harper and informed of CT results RML nodule resolved,  Informed of increase in size of Right thyroid mass and chronic esophageal thickening.  She report she was last dilated approx one year ago   Also spoke with Dr. Chalmers Cater and will fax results of CT to her office   Tonya Harper having some wheezing with pollen will see her in office in am

## 2014-05-05 ENCOUNTER — Ambulatory Visit (INDEPENDENT_AMBULATORY_CARE_PROVIDER_SITE_OTHER): Payer: Medicare Other | Admitting: Internal Medicine

## 2014-05-05 ENCOUNTER — Encounter: Payer: Self-pay | Admitting: Internal Medicine

## 2014-05-05 VITALS — BP 109/63 | HR 75 | Resp 16 | Ht 64.0 in | Wt 81.0 lb

## 2014-05-05 DIAGNOSIS — J441 Chronic obstructive pulmonary disease with (acute) exacerbation: Secondary | ICD-10-CM | POA: Diagnosis not present

## 2014-05-05 DIAGNOSIS — R911 Solitary pulmonary nodule: Secondary | ICD-10-CM

## 2014-05-05 DIAGNOSIS — IMO0001 Reserved for inherently not codable concepts without codable children: Secondary | ICD-10-CM

## 2014-05-05 DIAGNOSIS — R1310 Dysphagia, unspecified: Secondary | ICD-10-CM | POA: Diagnosis not present

## 2014-05-05 DIAGNOSIS — R0602 Shortness of breath: Secondary | ICD-10-CM

## 2014-05-05 MED ORDER — PREDNISOLONE 15 MG/5ML PO SOLN: ORAL | Status: DC

## 2014-05-05 MED ORDER — ALBUTEROL SULFATE (2.5 MG/3ML) 0.083% IN NEBU
2.5000 mg | INHALATION_SOLUTION | Freq: Once | RESPIRATORY_TRACT | Status: AC
  Administered 2014-05-05: 2.5 mg via RESPIRATORY_TRACT

## 2014-05-05 MED ORDER — METHYLPREDNISOLONE ACETATE 80 MG/ML IJ SUSP
80.0000 mg | Freq: Once | INTRAMUSCULAR | Status: AC
  Administered 2014-05-05: 80 mg via INTRAMUSCULAR
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
## 2014-06-02 DIAGNOSIS — L4050 Arthropathic psoriasis, unspecified: Secondary | ICD-10-CM | POA: Diagnosis not present

## 2014-06-17 IMAGING — CT CT CHEST W/O CM
2 of 3 series · 15 of 36 positions shown, 18 images · non-contrast
Comparison: CT ABD/PELVIS W CM dated 03/15/2013;

CLINICAL DATA: Followup pulmonary nodular densities seen on CT
abdomen pelvis.

EXAM:
CT CHEST WITHOUT CONTRAST
TECHNIQUE: Multidetector CT imaging of the chest was performed following the
standard protocol without IV contrast.

[Series 2: chest 5.0 b31f · axial · 0.59mm/px · z∈[-319,-49]mm · 12 of 64 slices shown, 15 images]
[im 5/64  mediastinal]
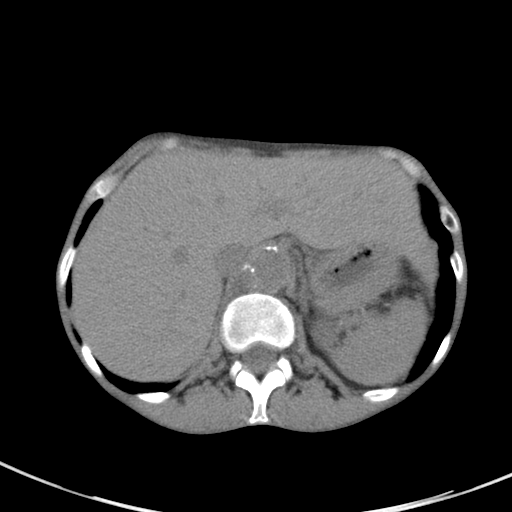
[im 5/64  lung]
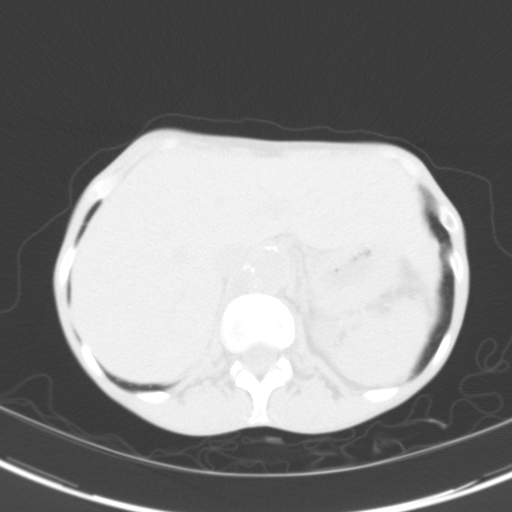
[im 10/64  lung]
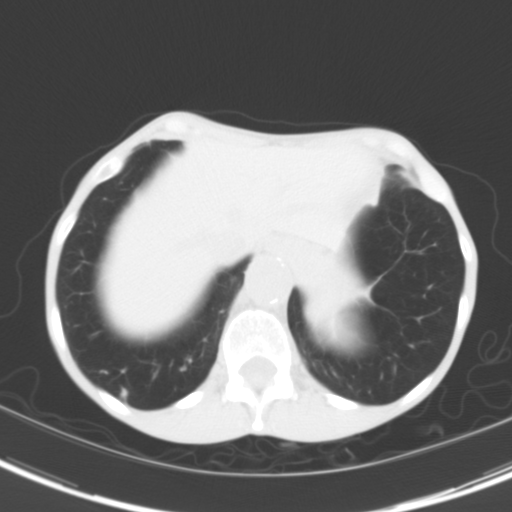
[im 15/64  lung]
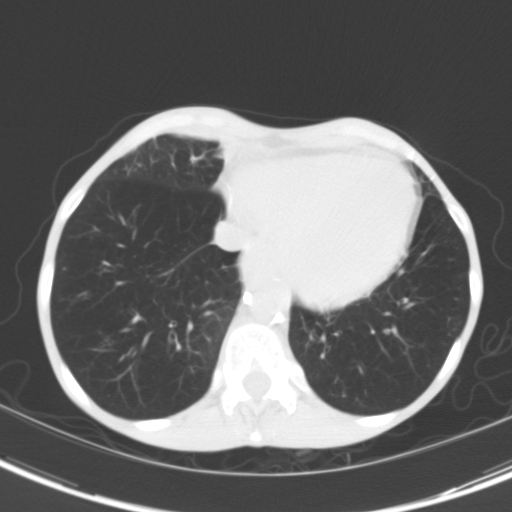
[im 19/64  lung]
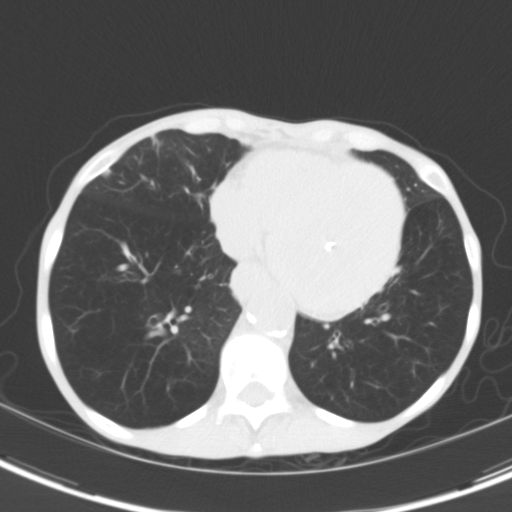
[im 24/64  mediastinal]
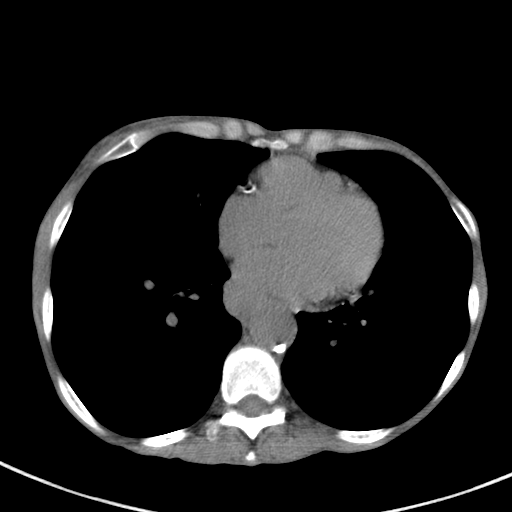
[im 24/64  lung]
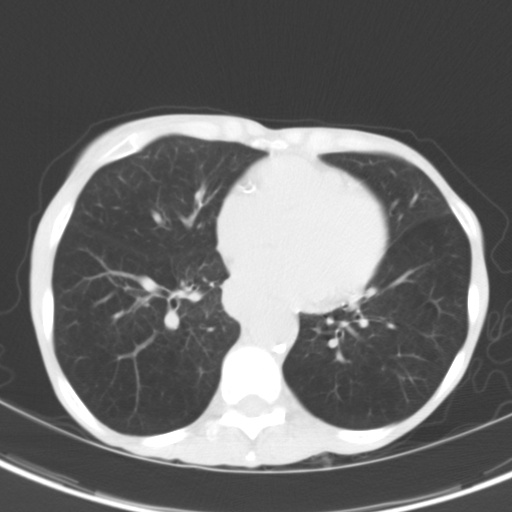
[im 29/64  lung]
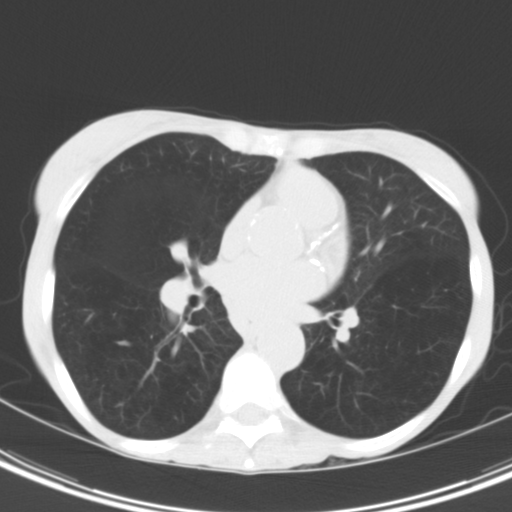
[im 36/64  lung]
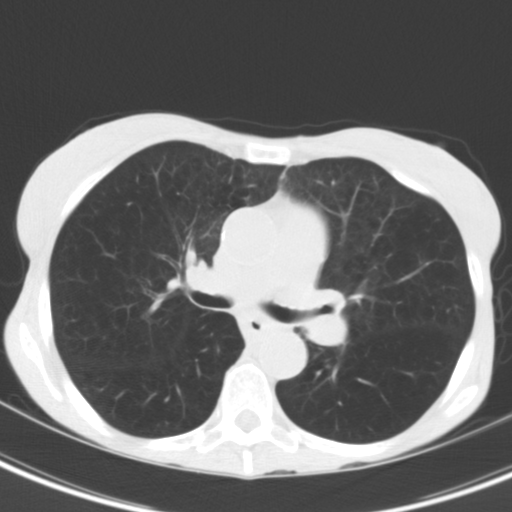
[im 40/64  lung]
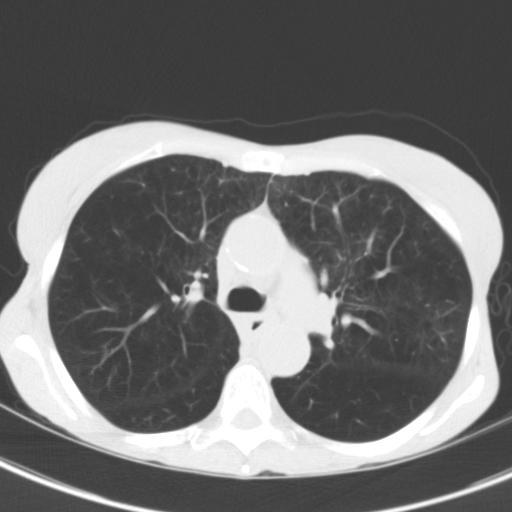
[im 45/64  mediastinal]
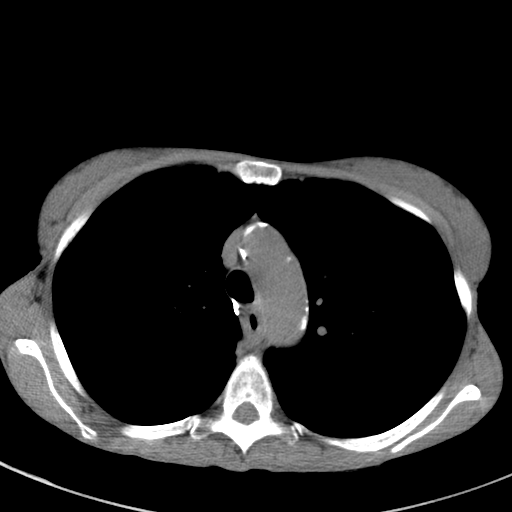
[im 45/64  lung]
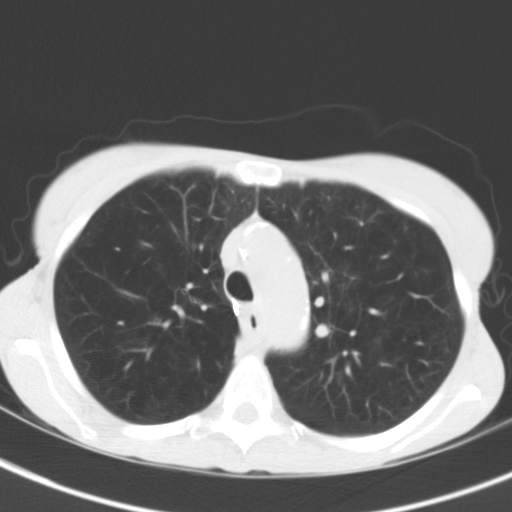
[im 50/64  lung]
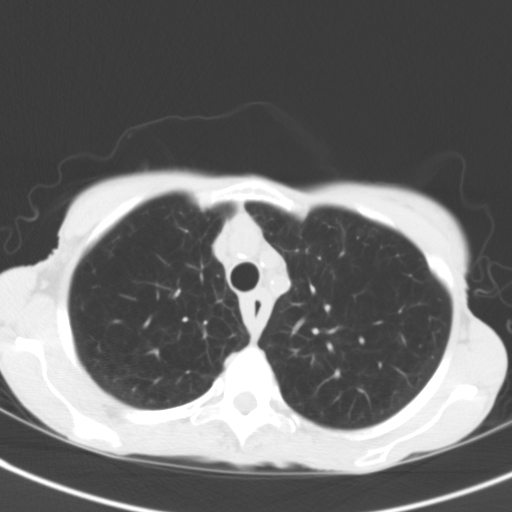
[im 54/64  lung]
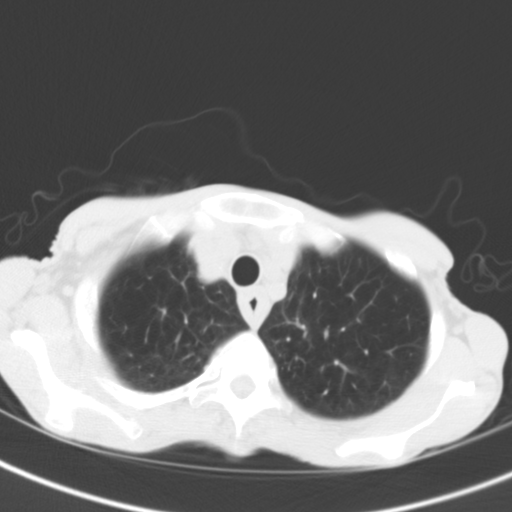
[im 59/64  lung]
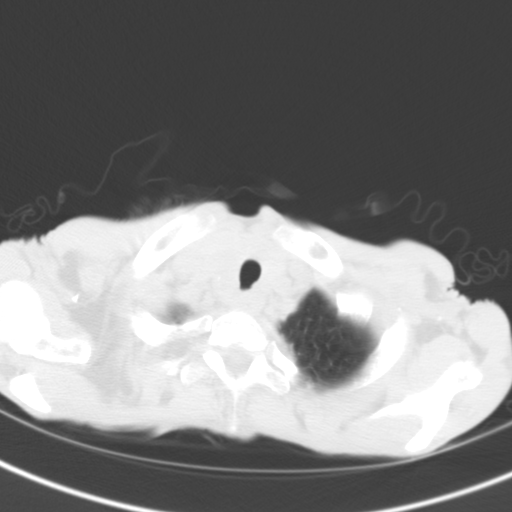

[Series 6: chest 3.0 coronal · coronal · 0.62mm/px · 3 of 71 slices shown]
[im 15/71  lung]
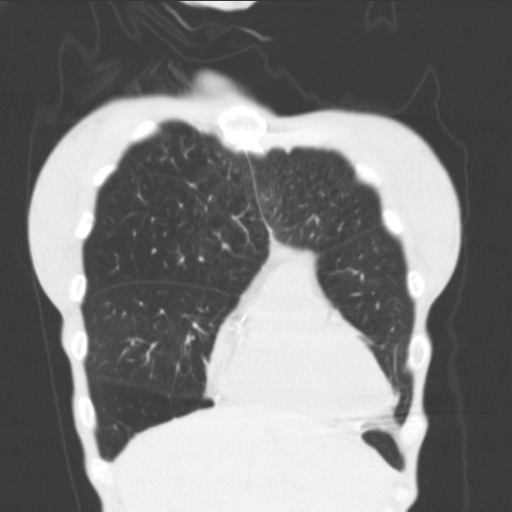
[im 29/71  lung]
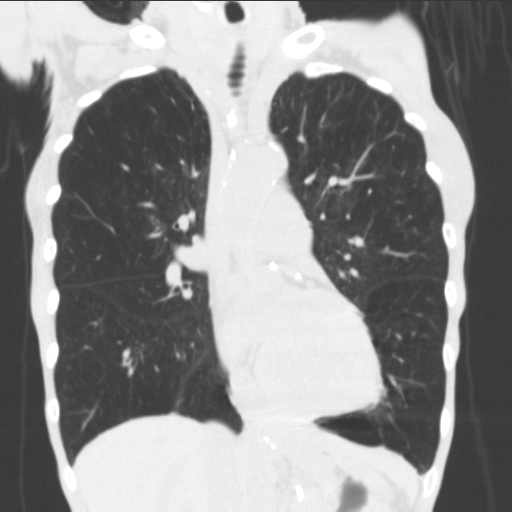
[im 43/71  lung]
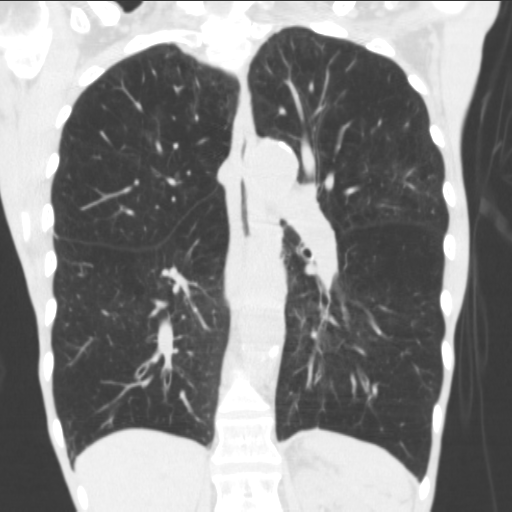

[15 of 36 positions shown; findings below may reference images not displayed]

CT CHEST W/O CM
dated 12/02/2012; US THYROID BIOPSY dated 06/25/2011; US SOFT TISSUE
HEAD/NECK dated 06/11/2011
FINDINGS: A 1.7 x 2.4 cm low-attenuation lesion in the right lobe of the
thyroid is grossly stable from 12/02/2012 and was sonographically
examined on 06/11/2011. No pathologically enlarged mediastinal or
axillary lymph nodes. Hilar regions are difficult to definitively
evaluate without IV contrast. Calcified lymph nodes or surgical
clips adjacent to the mid esophagus and lower trachea.
Atherosclerotic calcification of the arterial vasculature, including
extensive involvement of the coronary arteries. Heart size normal.
No pericardial effusion. Esophageal wall appears diffusely
thickened, as before.

Mild biapical pleural parenchymal scarring, right greater than left.
Centrilobular emphysema. Scattered nodularity in the right middle
and right lower lobes measures 5 mm or less in size, is stable where
visualized 03/15/2013 and new from 12/02/2012. No pleural fluid.
Airway is unremarkable.

Incidental imaging of the upper abdomen shows the visualized
portions of the liver, adrenal glands, left kidney, spleen, pancreas
and stomach to be grossly unremarkable. Vascular calcifications. No
worrisome lytic or sclerotic lesions.
IMPRESSION: 1. Small pulmonary nodular densities in the right middle lobe and
right lower lobe are likely infectious or inflammatory in etiology.
Please see discussion above. If the patient is at high risk for
bronchogenic carcinoma, follow-up chest CT at 6-12 months is
recommended. If the patient is at low risk for bronchogenic
carcinoma, follow-up chest CT at 12 months is recommended. This
recommendation follows the consensus statement: Guidelines for
Management of Small Pulmonary Nodules Detected on CT Scans: A
Statement from the [HOSPITAL] as published in Radiology
4667;[DATE].
2. Diffuse esophageal wall thickening, as on 12/02/2012. Please
correlate clinically.
3. Extensive 3 vessel coronary artery calcification.

## 2014-08-31 ENCOUNTER — Encounter (HOSPITAL_BASED_OUTPATIENT_CLINIC_OR_DEPARTMENT_OTHER): Payer: Self-pay | Admitting: *Deleted

## 2014-08-31 ENCOUNTER — Emergency Department (HOSPITAL_BASED_OUTPATIENT_CLINIC_OR_DEPARTMENT_OTHER)
Admission: EM | Admit: 2014-08-31 | Discharge: 2014-09-01 | Disposition: A | Discharge status: Home / Self Care | Payer: Medicare Other | Attending: Emergency Medicine | Admitting: Emergency Medicine

## 2014-08-31 DIAGNOSIS — Z8619 Personal history of other infectious and parasitic diseases: Secondary | ICD-10-CM | POA: Insufficient documentation

## 2014-08-31 DIAGNOSIS — Z7951 Long term (current) use of inhaled steroids: Secondary | ICD-10-CM | POA: Insufficient documentation

## 2014-08-31 DIAGNOSIS — Z79899 Other long term (current) drug therapy: Secondary | ICD-10-CM | POA: Diagnosis not present

## 2014-08-31 DIAGNOSIS — K222 Esophageal obstruction: Secondary | ICD-10-CM | POA: Diagnosis not present

## 2014-08-31 DIAGNOSIS — Z87828 Personal history of other (healed) physical injury and trauma: Secondary | ICD-10-CM | POA: Insufficient documentation

## 2014-08-31 DIAGNOSIS — J449 Chronic obstructive pulmonary disease, unspecified: Secondary | ICD-10-CM | POA: Insufficient documentation

## 2014-08-31 DIAGNOSIS — I5022 Chronic systolic (congestive) heart failure: Secondary | ICD-10-CM | POA: Insufficient documentation

## 2014-08-31 DIAGNOSIS — R4702 Dysphasia: Secondary | ICD-10-CM | POA: Diagnosis present

## 2014-08-31 DIAGNOSIS — I2510 Atherosclerotic heart disease of native coronary artery without angina pectoris: Secondary | ICD-10-CM | POA: Diagnosis not present

## 2014-08-31 DIAGNOSIS — I10 Essential (primary) hypertension: Secondary | ICD-10-CM | POA: Diagnosis not present

## 2014-08-31 DIAGNOSIS — H9190 Unspecified hearing loss, unspecified ear: Secondary | ICD-10-CM | POA: Diagnosis not present

## 2014-08-31 DIAGNOSIS — Z8639 Personal history of other endocrine, nutritional and metabolic disease: Secondary | ICD-10-CM | POA: Insufficient documentation

## 2014-08-31 DIAGNOSIS — Z86010 Personal history of colonic polyps: Secondary | ICD-10-CM | POA: Insufficient documentation

## 2014-08-31 DIAGNOSIS — Z87891 Personal history of nicotine dependence: Secondary | ICD-10-CM | POA: Insufficient documentation

## 2014-08-31 DIAGNOSIS — D649 Anemia, unspecified: Secondary | ICD-10-CM | POA: Insufficient documentation

## 2014-08-31 DIAGNOSIS — T18128A Food in esophagus causing other injury, initial encounter: Secondary | ICD-10-CM

## 2014-08-31 MED ORDER — TRAMADOL HCL 50 MG PO TABS: 50.0000 mg | ORAL_TABLET | Freq: Four times a day (QID) | ORAL | Status: DC | PRN

## 2014-08-31 MED ORDER — ONDANSETRON HCL 4 MG/2ML IJ SOLN
4.0000 mg | Freq: Once | INTRAMUSCULAR | Status: AC
  Administered 2014-08-31: 4 mg via INTRAVENOUS
  Filled 2014-08-31: qty 2

## 2014-08-31 MED ORDER — GLUCAGON HCL RDNA (DIAGNOSTIC) 1 MG IJ SOLR
1.0000 mg | Freq: Once | INTRAMUSCULAR | Status: AC
  Administered 2014-08-31: 1 mg via INTRAVENOUS
  Filled 2014-08-31: qty 1

## 2014-08-31 NOTE — ED Notes (Signed)
Pt sts has a piece of meat stuck in her throat. Pt in NAD.

## 2014-08-31 NOTE — ED Notes (Signed)
States meat caught in throat since 1600,  Dif swallowing,  No diff talking

## 2014-08-31 NOTE — ED Notes (Signed)
Called via [facility] for repaged for [facility] spoke with Baxter Flattery at [phone]

## 2014-08-31 NOTE — ED Notes (Signed)
Pt states she no longer has meat stuck in throat,  Wants something to drink .  States she does not want to be admitted

## 2014-08-31 NOTE — ED Notes (Signed)
Pt drinking fluid without diff
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
## 2014-08-31 NOTE — ED Notes (Signed)
Spoke with Coralyn Mark at [facility] they have paged [facility] 3 times with the last time being 5 minutes ago. Awaiting response from page.

## 2014-08-31 NOTE — Discharge Instructions (Signed)
Esophageal Stricture °The esophagus is the long, narrow tube which carries food and liquid from the mouth to the stomach. Sometimes a part of the esophagus becomes narrow and makes it difficult, painful, or even impossible to swallow. This is called an esophageal stricture.  °CAUSES  °Common causes of blockage or strictures of the esophagus are: °· Exposure of the lower esophagus to the acid from the stomach may cause narrowing. °· Hiatal hernia in which a small part of the stomach bulges up through the diaphragm can cause a narrowing in the bottom of the esophagus. °· Scleroderma is a tissue disorder that affects the esophagus and makes swallowing difficult. °· Achalasia is an absence of nerves in the lower esophagus and to the esophageal sphincter. This absence of nerves may be congenital (present since birth). This can cause irregular spasms which do not allow food and fluid through. °· Strictures may develop from swallowing materials which damage the esophagus. Examples are acids or alkalis such as lye. °· Schatzki's Ring is a narrow ring of non-cancerous tissue which narrows the lower esophagus. The cause of this is unknown. °· Growths can block the esophagus. °SYMPTOMS  °Some of the problems are difficulty swallowing or pain with swallowing. °DIAGNOSIS  °Your caregiver often suspects this problem by taking a medical history. They will also do a physical exam. They may then take X-rays and/or perform an endoscopy. Endoscopy is an exam in which a tube like a small flexible telescope is used to look at your esophagus.  °TREATMENT °· One form of treatment is to dilate the narrow area. This means to stretch it. °· When this is not successful, chest surgery may be required. This is a much more extensive form of treatment with a longer recovery time. °Both of the above treatments make the passage of food and water into the stomach easier. They also make it easier for stomach contents to bubble back into the  esophagus. Special medications may be used following the procedure to help prevent further narrowing. Medications may be used to lower the amount of acid in the stomach juice.  °SEEK IMMEDIATE MEDICAL CARE IF:  °· Your swallowing is becoming more painful, difficult, or you are unable to swallow. °· You vomit up blood. °· You develop black tarry stools. °· You develop chills. °· You have a fever. °· You develop chest or abdominal pain. °· You develop shortness of breath, feel lightheaded, or faint. °Follow up with medical care as your caregiver suggests. °Document Released: 09/24/2005 Document Revised: 04/08/2011 Document Reviewed: 05/26/2013 °ExitCare® Patient Information ©2015 [employer]. This information is not intended to replace advice given to you by your health care provider. Make sure you discuss any questions you have with your health care provider. ° °

## 2014-09-01 ENCOUNTER — Other Ambulatory Visit: Payer: Self-pay

## 2014-09-01 ENCOUNTER — Telehealth: Payer: Self-pay

## 2014-09-01 DIAGNOSIS — R1314 Dysphagia, pharyngoesophageal phase: Secondary | ICD-10-CM

## 2014-09-01 NOTE — Telephone Encounter (Signed)
Next hospital week with MAC (morning appt please)    JMP        ----- Message -----     From: [name], RN     Sent: 09/01/2014  9:17 AM      To: Jerene Bears, MD        First available 10/31/14. Is this ok?        ----- Message -----     From: Jerene Bears, MD     Sent: 09/01/2014  8:51 AM      To: [name], RN        ED with food impaction, cleared on its on    Needs to be put on for EGD    Soft foods until EGD    Daily PPI        ----- Message -----     From: SYSTEM     Sent: 09/01/2014 12:16 AM      To: Jerene Bears, MD        Pt scheduled for EGD with dil and MAC at [facility] 10/20/14@7 :30am. Left message for pt to call back.

## 2014-09-06 NOTE — Telephone Encounter (Signed)
Pt aware and scheduled for previsit 09/15/14@8am . Pt aware of appts.

## 2014-09-08 ENCOUNTER — Other Ambulatory Visit: Payer: Self-pay | Admitting: Critical Care Medicine

## 2014-09-15 ENCOUNTER — Ambulatory Visit (AMBULATORY_SURGERY_CENTER): Payer: Self-pay

## 2014-09-15 VITALS — Ht 64.0 in | Wt 77.0 lb

## 2014-09-15 DIAGNOSIS — R1314 Dysphagia, pharyngoesophageal phase: Secondary | ICD-10-CM

## 2014-09-15 NOTE — Progress Notes (Signed)
No egg or soy allergies Pt uses 2 liters of home 02 as needed  No previous anesthesia complications

## 2014-10-10 ENCOUNTER — Encounter (HOSPITAL_COMMUNITY): Payer: Self-pay | Admitting: *Deleted

## 2014-10-13 DIAGNOSIS — I5022 Chronic systolic (congestive) heart failure: Secondary | ICD-10-CM | POA: Diagnosis not present

## 2014-10-13 DIAGNOSIS — I214 Non-ST elevation (NSTEMI) myocardial infarction: Secondary | ICD-10-CM | POA: Insufficient documentation

## 2014-10-13 DIAGNOSIS — Z681 Body mass index (BMI) 19 or less, adult: Secondary | ICD-10-CM | POA: Diagnosis not present

## 2014-10-13 DIAGNOSIS — K222 Esophageal obstruction: Secondary | ICD-10-CM | POA: Diagnosis not present

## 2014-10-13 DIAGNOSIS — E876 Hypokalemia: Secondary | ICD-10-CM | POA: Diagnosis not present

## 2014-10-13 DIAGNOSIS — I10 Essential (primary) hypertension: Secondary | ICD-10-CM | POA: Diagnosis not present

## 2014-10-13 DIAGNOSIS — R079 Chest pain, unspecified: Secondary | ICD-10-CM | POA: Diagnosis not present

## 2014-10-13 DIAGNOSIS — M545 Low back pain: Secondary | ICD-10-CM | POA: Diagnosis not present

## 2014-10-13 DIAGNOSIS — R0789 Other chest pain: Secondary | ICD-10-CM | POA: Diagnosis not present

## 2014-10-13 DIAGNOSIS — I252 Old myocardial infarction: Secondary | ICD-10-CM | POA: Diagnosis not present

## 2014-10-13 DIAGNOSIS — Z7952 Long term (current) use of systemic steroids: Secondary | ICD-10-CM | POA: Diagnosis not present

## 2014-10-13 DIAGNOSIS — I071 Rheumatic tricuspid insufficiency: Secondary | ICD-10-CM | POA: Diagnosis not present

## 2014-10-13 DIAGNOSIS — E43 Unspecified severe protein-calorie malnutrition: Secondary | ICD-10-CM | POA: Diagnosis not present

## 2014-10-13 DIAGNOSIS — J449 Chronic obstructive pulmonary disease, unspecified: Secondary | ICD-10-CM | POA: Diagnosis not present

## 2014-10-13 DIAGNOSIS — M79603 Pain in arm, unspecified: Secondary | ICD-10-CM | POA: Diagnosis not present

## 2014-10-13 DIAGNOSIS — K219 Gastro-esophageal reflux disease without esophagitis: Secondary | ICD-10-CM | POA: Diagnosis not present

## 2014-10-13 DIAGNOSIS — S2190XA Unspecified open wound of unspecified part of thorax, initial encounter: Secondary | ICD-10-CM | POA: Diagnosis not present

## 2014-10-13 DIAGNOSIS — D649 Anemia, unspecified: Secondary | ICD-10-CM | POA: Diagnosis not present

## 2014-10-13 DIAGNOSIS — M25512 Pain in left shoulder: Secondary | ICD-10-CM | POA: Diagnosis not present

## 2014-10-13 DIAGNOSIS — Z87891 Personal history of nicotine dependence: Secondary | ICD-10-CM | POA: Diagnosis not present

## 2014-10-13 DIAGNOSIS — K449 Diaphragmatic hernia without obstruction or gangrene: Secondary | ICD-10-CM | POA: Diagnosis not present

## 2014-10-13 DIAGNOSIS — Z79899 Other long term (current) drug therapy: Secondary | ICD-10-CM | POA: Diagnosis not present

## 2014-10-13 DIAGNOSIS — J9610 Chronic respiratory failure, unspecified whether with hypoxia or hypercapnia: Secondary | ICD-10-CM | POA: Diagnosis not present

## 2014-10-13 DIAGNOSIS — Z041 Encounter for examination and observation following transport accident: Secondary | ICD-10-CM | POA: Diagnosis not present

## 2014-10-14 DIAGNOSIS — D649 Anemia, unspecified: Secondary | ICD-10-CM | POA: Diagnosis not present

## 2014-10-14 DIAGNOSIS — J449 Chronic obstructive pulmonary disease, unspecified: Secondary | ICD-10-CM | POA: Diagnosis not present

## 2014-10-14 DIAGNOSIS — J9610 Chronic respiratory failure, unspecified whether with hypoxia or hypercapnia: Secondary | ICD-10-CM | POA: Diagnosis not present

## 2014-10-14 DIAGNOSIS — R1310 Dysphagia, unspecified: Secondary | ICD-10-CM | POA: Diagnosis not present

## 2014-10-14 DIAGNOSIS — I361 Nonrheumatic tricuspid (valve) insufficiency: Secondary | ICD-10-CM | POA: Diagnosis not present

## 2014-10-14 DIAGNOSIS — R0789 Other chest pain: Secondary | ICD-10-CM | POA: Diagnosis not present

## 2014-10-14 DIAGNOSIS — I429 Cardiomyopathy, unspecified: Secondary | ICD-10-CM | POA: Diagnosis not present

## 2014-10-14 DIAGNOSIS — I517 Cardiomegaly: Secondary | ICD-10-CM | POA: Diagnosis not present

## 2014-10-15 DIAGNOSIS — R1310 Dysphagia, unspecified: Secondary | ICD-10-CM | POA: Diagnosis not present

## 2014-10-15 DIAGNOSIS — K222 Esophageal obstruction: Secondary | ICD-10-CM | POA: Diagnosis not present

## 2014-10-15 DIAGNOSIS — R0789 Other chest pain: Secondary | ICD-10-CM | POA: Diagnosis not present

## 2014-10-15 DIAGNOSIS — K210 Gastro-esophageal reflux disease with esophagitis: Secondary | ICD-10-CM | POA: Diagnosis not present

## 2014-10-15 DIAGNOSIS — I5022 Chronic systolic (congestive) heart failure: Secondary | ICD-10-CM | POA: Diagnosis not present

## 2014-10-15 DIAGNOSIS — J9611 Chronic respiratory failure with hypoxia: Secondary | ICD-10-CM | POA: Diagnosis not present

## 2014-10-16 DIAGNOSIS — I5022 Chronic systolic (congestive) heart failure: Secondary | ICD-10-CM | POA: Diagnosis not present

## 2014-10-16 DIAGNOSIS — J9611 Chronic respiratory failure with hypoxia: Secondary | ICD-10-CM | POA: Diagnosis not present

## 2014-10-16 DIAGNOSIS — R0789 Other chest pain: Secondary | ICD-10-CM | POA: Diagnosis not present

## 2014-10-16 DIAGNOSIS — K210 Gastro-esophageal reflux disease with esophagitis: Secondary | ICD-10-CM | POA: Diagnosis not present

## 2014-10-17 ENCOUNTER — Telehealth: Payer: Self-pay | Admitting: Internal Medicine
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
## 2014-10-20 ENCOUNTER — Ambulatory Visit (HOSPITAL_COMMUNITY): Admission: RE | Admit: 2014-10-20 | Payer: Medicare Other | Source: Ambulatory Visit | Admitting: Internal Medicine

## 2014-10-20 ENCOUNTER — Encounter: Payer: Self-pay | Admitting: *Deleted

## 2014-10-20 SURGERY — EGD (ESOPHAGOGASTRODUODENOSCOPY)
Anesthesia: Monitor Anesthesia Care

## 2014-10-21 DIAGNOSIS — O9081 Anemia of the puerperium: Secondary | ICD-10-CM | POA: Diagnosis not present

## 2014-10-21 DIAGNOSIS — M545 Low back pain, unspecified: Secondary | ICD-10-CM | POA: Insufficient documentation

## 2014-10-21 DIAGNOSIS — R0602 Shortness of breath: Secondary | ICD-10-CM | POA: Diagnosis not present

## 2014-10-21 DIAGNOSIS — D509 Iron deficiency anemia, unspecified: Secondary | ICD-10-CM | POA: Diagnosis not present

## 2014-10-21 DIAGNOSIS — E43 Unspecified severe protein-calorie malnutrition: Secondary | ICD-10-CM | POA: Diagnosis not present

## 2014-10-24 ENCOUNTER — Encounter: Payer: Self-pay | Admitting: Internal Medicine

## 2014-10-24 ENCOUNTER — Ambulatory Visit (INDEPENDENT_AMBULATORY_CARE_PROVIDER_SITE_OTHER): Payer: Medicare Other | Admitting: Internal Medicine

## 2014-10-24 VITALS — BP 90/60 | HR 64 | Ht 62.6 in | Wt 74.5 lb

## 2014-10-24 DIAGNOSIS — Z9889 Other specified postprocedural states: Secondary | ICD-10-CM | POA: Diagnosis not present

## 2014-10-24 DIAGNOSIS — E441 Mild protein-calorie malnutrition: Secondary | ICD-10-CM | POA: Diagnosis not present

## 2014-10-24 DIAGNOSIS — R1314 Dysphagia, pharyngoesophageal phase: Secondary | ICD-10-CM

## 2014-10-24 DIAGNOSIS — Z86010 Personal history of colonic polyps: Secondary | ICD-10-CM

## 2014-10-24 DIAGNOSIS — K222 Esophageal obstruction: Secondary | ICD-10-CM

## 2014-10-24 NOTE — Patient Instructions (Signed)
It has been recommended to you by your physician that you have a(n) colonoscopy completed. Per your request, we did not schedule the procedure(s) today. Please contact our office at [phone] should you decide to have the procedure completed.  We will refer you to see Dr Gayleen Orem @ [facility]. We will contact you with appointment information once an appointment has been made.
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
## 2014-10-25 ENCOUNTER — Other Ambulatory Visit: Payer: Self-pay | Admitting: Critical Care Medicine

## 2014-10-27 ENCOUNTER — Ambulatory Visit (INDEPENDENT_AMBULATORY_CARE_PROVIDER_SITE_OTHER): Payer: 59 | Admitting: Adult Health

## 2014-10-27 DIAGNOSIS — J441 Chronic obstructive pulmonary disease with (acute) exacerbation: Secondary | ICD-10-CM

## 2014-10-27 NOTE — Progress Notes (Signed)
Opened in error, pt no show

## 2014-10-27 NOTE — Assessment & Plan Note (Addendum)
No show

## 2014-11-01 ENCOUNTER — Other Ambulatory Visit: Payer: Self-pay | Admitting: *Deleted

## 2014-11-01 MED ORDER — PRAVASTATIN SODIUM 20 MG PO TABS
20.0000 mg | ORAL_TABLET | Freq: Every day | ORAL | Status: DC
Start: 2014-11-01 — End: 2014-11-18

## 2014-11-03 ENCOUNTER — Telehealth: Payer: Self-pay | Admitting: *Deleted

## 2014-11-03 ENCOUNTER — Ambulatory Visit (INDEPENDENT_AMBULATORY_CARE_PROVIDER_SITE_OTHER): Payer: Medicare Other | Admitting: Adult Health

## 2014-11-03 ENCOUNTER — Encounter: Payer: Self-pay | Admitting: Adult Health

## 2014-11-03 VITALS — BP 130/85 | HR 67 | Ht 64.0 in | Wt 78.0 lb

## 2014-11-03 DIAGNOSIS — J441 Chronic obstructive pulmonary disease with (acute) exacerbation: Secondary | ICD-10-CM | POA: Diagnosis not present

## 2014-11-03 DIAGNOSIS — J9611 Chronic respiratory failure with hypoxia: Secondary | ICD-10-CM | POA: Diagnosis not present

## 2014-11-03 DIAGNOSIS — Z23 Encounter for immunization: Secondary | ICD-10-CM | POA: Diagnosis not present

## 2014-11-03 MED ORDER — BUDESONIDE-FORMOTEROL FUMARATE 160-4.5 MCG/ACT IN AERO: INHALATION_SPRAY | RESPIRATORY_TRACT | Status: DC

## 2014-11-03 MED ORDER — ALBUTEROL SULFATE 108 (90 BASE) MCG/ACT IN AEPB: 2.0000 | INHALATION_SPRAY | Freq: Four times a day (QID) | RESPIRATORY_TRACT | Status: DC | PRN

## 2014-11-03 NOTE — Assessment & Plan Note (Signed)
Cont on O2 with activity as needed.  

## 2014-11-03 NOTE — Patient Instructions (Addendum)
Continue on Symbicort 2 puffs Twice daily  , rinse after use .  Wear oxygen with activity as needed.  Flu shot today.  follow up Dr. Elsworth Soho  In 6 months and As needed

## 2014-11-03 NOTE — Telephone Encounter (Signed)
I contacted [facility] to be certain that the records and referral I sent to them were being processed. They state that the referral has been processed and the patient was contacted yesterday to schedule an appointment. They left a message on her voicemail. I have contacted the patient as well and have given her the number to call back to go ahead and get appointment scheduled. She states that she will do this.
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
## 2014-11-08 DIAGNOSIS — M545 Low back pain: Secondary | ICD-10-CM | POA: Diagnosis not present

## 2014-11-08 NOTE — Progress Notes (Signed)
Reviewed & agree with plan  

## 2014-11-11 DIAGNOSIS — M545 Low back pain: Secondary | ICD-10-CM | POA: Diagnosis not present

## 2014-11-15 ENCOUNTER — Telehealth: Payer: Self-pay | Admitting: Cardiology

## 2014-11-15 DIAGNOSIS — M545 Low back pain: Secondary | ICD-10-CM | POA: Diagnosis not present

## 2014-11-15 NOTE — Telephone Encounter (Signed)
New Message  Refills    New message (refills)   Comments: pt called states that she is out of all of her medications. She just made an appt for 10/26 with Dr. Mare Ferrari. She has been out of of her BP meds for about three days. Please fax to pharmacy.   1. Which medications need to be refilled? All of them.   2. Which pharmacy/location is medication to be sent to? [facility] on [address]   3. Do they need a 30 day or 90 day supply?  All of them are 90 days

## 2014-11-17 DIAGNOSIS — M545 Low back pain: Secondary | ICD-10-CM | POA: Diagnosis not present

## 2014-11-17 NOTE — Telephone Encounter (Signed)
Called pt and left message asking pt to call back with the names of the medications that she needs to be refilled.

## 2014-11-18 ENCOUNTER — Other Ambulatory Visit: Payer: Self-pay | Admitting: *Deleted

## 2014-11-18 MED ORDER — LISINOPRIL 40 MG PO TABS: 40.0000 mg | ORAL_TABLET | Freq: Every day | ORAL | Status: DC

## 2014-11-18 MED ORDER — CARVEDILOL 25 MG PO TABS: ORAL_TABLET | ORAL | Status: DC

## 2014-11-18 MED ORDER — PRAVASTATIN SODIUM 20 MG PO TABS: 20.0000 mg | ORAL_TABLET | Freq: Every day | ORAL | Status: DC

## 2014-11-22 DIAGNOSIS — M545 Low back pain: Secondary | ICD-10-CM | POA: Diagnosis not present
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
## 2014-11-23 ENCOUNTER — Other Ambulatory Visit: Payer: Self-pay | Admitting: Cardiology

## 2014-11-23 ENCOUNTER — Ambulatory Visit (INDEPENDENT_AMBULATORY_CARE_PROVIDER_SITE_OTHER): Payer: Medicare Other | Admitting: Cardiology

## 2014-11-23 ENCOUNTER — Encounter: Payer: Self-pay | Admitting: Cardiology

## 2014-11-23 VITALS — BP 157/79 | HR 62 | Ht 64.0 in | Wt 79.0 lb

## 2014-11-23 DIAGNOSIS — I714 Abdominal aortic aneurysm, without rupture, unspecified: Secondary | ICD-10-CM

## 2014-11-23 DIAGNOSIS — E785 Hyperlipidemia, unspecified: Secondary | ICD-10-CM

## 2014-11-23 LAB — HEPATIC FUNCTION PANEL
ALT: 8 U/L (ref 6–29)
AST: 15 U/L (ref 10–35)
Albumin: 3.7 g/dL (ref 3.6–5.1)
Alkaline Phosphatase: 27 U/L — ABNORMAL LOW (ref 33–130)
Bilirubin, Direct: 0.2 mg/dL (ref <=0.2)
Indirect Bilirubin: 0.6 mg/dL (ref 0.2–1.2)
Total Bilirubin: 0.8 mg/dL (ref 0.2–1.2)
Total Protein: 7.1 g/dL (ref 6.1–8.1)

## 2014-11-23 LAB — LIPID PANEL
Cholesterol: 123 mg/dL — ABNORMAL LOW (ref 125–200)
HDL: 49 mg/dL (ref >=46)
LDL Cholesterol: 60 mg/dL (ref <130)
Total CHOL/HDL Ratio: 2.5 Ratio (ref <=5.0)
Triglycerides: 69 mg/dL (ref <150)
VLDL: 14 mg/dL (ref <30)

## 2014-11-23 MED ORDER — AMLODIPINE BESYLATE 5 MG PO TABS: 5.0000 mg | ORAL_TABLET | Freq: Every day | ORAL | Status: DC

## 2014-11-23 MED ORDER — CARVEDILOL 25 MG PO TABS: ORAL_TABLET | ORAL | Status: DC

## 2014-11-23 MED ORDER — PRAVASTATIN SODIUM 20 MG PO TABS: 20.0000 mg | ORAL_TABLET | Freq: Every day | ORAL | Status: DC

## 2014-11-23 MED ORDER — NITROGLYCERIN 0.4 MG SL SUBL: 0.4000 mg | SUBLINGUAL_TABLET | SUBLINGUAL | Status: DC | PRN

## 2014-11-23 MED ORDER — LISINOPRIL 40 MG PO TABS: 40.0000 mg | ORAL_TABLET | Freq: Every day | ORAL | Status: DC

## 2014-11-23 NOTE — Assessment & Plan Note (Signed)
Continue aspirin and statin. 

## 2014-11-23 NOTE — Patient Instructions (Signed)
Medication Instructions:   START AMLODIPINE 5 MG ONCE DAILY  Labwork:  Your physician recommends that you HAVE LAB WORK TODAY  Testing/Procedures:  Your physician has requested that you have an abdominal aorta duplex. During this test, an ultrasound is used to evaluate the aorta. Allow 30 minutes for this exam. Do not eat after midnight the day before and avoid carbonated beverages   Follow-Up:  Your physician wants you to follow-up in: [place] will receive a reminder letter in the mail two months in advance. If you don't receive a letter, please call our office to schedule the follow-up appointment.   If you need a refill on your cardiac medications before your next appointment, please call your pharmacy.

## 2014-11-23 NOTE — Assessment & Plan Note (Signed)
Continue ACE inhibitor and beta blocker. LV function improved on most recent echocardiogram.

## 2014-11-23 NOTE — Assessment & Plan Note (Signed)
Blood pressure elevated. Add amlodipine 5 mg daily and follow. 

## 2014-11-23 NOTE — Assessment & Plan Note (Signed)
Schedule followup abdominal ultrasound. 

## 2014-11-23 NOTE — Assessment & Plan Note (Signed)
Continue statin. Check lipids and liver. 

## 2014-11-24 ENCOUNTER — Encounter: Payer: Self-pay | Admitting: *Deleted

## 2014-11-24 DIAGNOSIS — M545 Low back pain: Secondary | ICD-10-CM | POA: Diagnosis not present

## 2014-11-25 ENCOUNTER — Ambulatory Visit (HOSPITAL_BASED_OUTPATIENT_CLINIC_OR_DEPARTMENT_OTHER)
Admission: RE | Admit: 2014-11-25 | Discharge: 2014-11-25 | Disposition: A | Discharge status: Home / Self Care | Payer: Medicare Other | Source: Ambulatory Visit | Attending: Cardiology | Admitting: Cardiology

## 2014-11-25 DIAGNOSIS — I714 Abdominal aortic aneurysm, without rupture, unspecified: Secondary | ICD-10-CM

## 2014-11-29 DIAGNOSIS — M545 Low back pain: Secondary | ICD-10-CM | POA: Diagnosis not present

## 2014-11-29 NOTE — Addendum Note (Signed)
Addended by: Therisa Doyne on: 11/29/2014 05:02 PM   Modules accepted: Orders

## 2014-11-29 NOTE — Addendum Note (Signed)
Addended by: Therisa Doyne on: 11/29/2014 05:18 PM   Modules accepted: Orders

## 2014-12-01 DIAGNOSIS — M545 Low back pain: Secondary | ICD-10-CM | POA: Diagnosis not present

## 2014-12-02 ENCOUNTER — Encounter: Payer: Self-pay | Admitting: *Deleted

## 2014-12-13 DIAGNOSIS — L4050 Arthropathic psoriasis, unspecified: Secondary | ICD-10-CM | POA: Diagnosis not present

## 2015-01-03 DIAGNOSIS — Z79899 Other long term (current) drug therapy: Secondary | ICD-10-CM | POA: Diagnosis not present

## 2015-01-03 DIAGNOSIS — L4050 Arthropathic psoriasis, unspecified: Secondary | ICD-10-CM | POA: Diagnosis not present

## 2015-01-28 ENCOUNTER — Other Ambulatory Visit: Payer: Self-pay | Admitting: Adult Health

## 2015-02-03 ENCOUNTER — Telehealth: Payer: Self-pay | Admitting: Adult Health

## 2015-02-03 MED ORDER — ALBUTEROL SULFATE HFA 108 (90 BASE) MCG/ACT IN AERS: INHALATION_SPRAY | RESPIRATORY_TRACT | Status: DC

## 2015-02-03 NOTE — Telephone Encounter (Signed)
Called spoke with pt. She needs refill on her proventil. I have sent this in. nothing further needed

## 2015-02-08 DIAGNOSIS — R5383 Other fatigue: Secondary | ICD-10-CM | POA: Diagnosis not present

## 2015-02-08 DIAGNOSIS — L4050 Arthropathic psoriasis, unspecified: Secondary | ICD-10-CM | POA: Diagnosis not present

## 2015-02-08 DIAGNOSIS — Z79899 Other long term (current) drug therapy: Secondary | ICD-10-CM | POA: Diagnosis not present

## 2015-02-10 ENCOUNTER — Ambulatory Visit: Payer: Medicare Other | Admitting: Family Medicine

## 2015-02-10 ENCOUNTER — Telehealth: Payer: Self-pay | Admitting: Family Medicine

## 2015-02-14 NOTE — Telephone Encounter (Signed)
charge 

## 2015-02-14 NOTE — Telephone Encounter (Signed)
Pt was no show for new pt appt 02/10/15 1:30pm, pt has not rescheduled, charge or no charge?

## 2015-02-15 ENCOUNTER — Encounter: Payer: Self-pay | Admitting: Family Medicine

## 2015-02-15 NOTE — Telephone Encounter (Signed)
Marked to charge, mailing letter °

## 2015-04-10 DIAGNOSIS — L4050 Arthropathic psoriasis, unspecified: Secondary | ICD-10-CM | POA: Diagnosis not present

## 2015-04-10 DIAGNOSIS — Z79899 Other long term (current) drug therapy: Secondary | ICD-10-CM | POA: Diagnosis not present

## 2015-04-18 DIAGNOSIS — I2510 Atherosclerotic heart disease of native coronary artery without angina pectoris: Secondary | ICD-10-CM | POA: Diagnosis not present

## 2015-04-18 DIAGNOSIS — R072 Precordial pain: Secondary | ICD-10-CM | POA: Diagnosis not present

## 2015-04-18 DIAGNOSIS — J349 Unspecified disorder of nose and nasal sinuses: Secondary | ICD-10-CM | POA: Diagnosis not present

## 2015-04-18 DIAGNOSIS — I5022 Chronic systolic (congestive) heart failure: Secondary | ICD-10-CM | POA: Diagnosis not present

## 2015-04-18 DIAGNOSIS — E785 Hyperlipidemia, unspecified: Secondary | ICD-10-CM | POA: Diagnosis not present

## 2015-04-18 DIAGNOSIS — N390 Urinary tract infection, site not specified: Secondary | ICD-10-CM | POA: Diagnosis not present

## 2015-04-18 DIAGNOSIS — R079 Chest pain, unspecified: Secondary | ICD-10-CM | POA: Diagnosis not present

## 2015-04-18 DIAGNOSIS — I252 Old myocardial infarction: Secondary | ICD-10-CM | POA: Diagnosis not present

## 2015-04-19 DIAGNOSIS — Z79891 Long term (current) use of opiate analgesic: Secondary | ICD-10-CM | POA: Diagnosis not present

## 2015-04-19 DIAGNOSIS — N390 Urinary tract infection, site not specified: Secondary | ICD-10-CM | POA: Diagnosis not present

## 2015-04-19 DIAGNOSIS — Z887 Allergy status to serum and vaccine status: Secondary | ICD-10-CM | POA: Diagnosis not present

## 2015-04-19 DIAGNOSIS — Z7951 Long term (current) use of inhaled steroids: Secondary | ICD-10-CM | POA: Diagnosis not present

## 2015-04-19 DIAGNOSIS — R0602 Shortness of breath: Secondary | ICD-10-CM | POA: Diagnosis not present

## 2015-04-19 DIAGNOSIS — R8281 Pyuria: Secondary | ICD-10-CM | POA: Insufficient documentation

## 2015-04-19 DIAGNOSIS — R072 Precordial pain: Secondary | ICD-10-CM | POA: Diagnosis not present

## 2015-04-19 DIAGNOSIS — Z886 Allergy status to analgesic agent status: Secondary | ICD-10-CM | POA: Diagnosis not present

## 2015-04-19 DIAGNOSIS — K219 Gastro-esophageal reflux disease without esophagitis: Secondary | ICD-10-CM | POA: Diagnosis present

## 2015-04-19 DIAGNOSIS — Z91013 Allergy to seafood: Secondary | ICD-10-CM | POA: Diagnosis not present

## 2015-04-19 DIAGNOSIS — Z7952 Long term (current) use of systemic steroids: Secondary | ICD-10-CM | POA: Diagnosis not present

## 2015-04-19 DIAGNOSIS — M1990 Unspecified osteoarthritis, unspecified site: Secondary | ICD-10-CM | POA: Diagnosis present

## 2015-04-19 DIAGNOSIS — I2510 Atherosclerotic heart disease of native coronary artery without angina pectoris: Secondary | ICD-10-CM | POA: Diagnosis present

## 2015-04-19 DIAGNOSIS — Z87891 Personal history of nicotine dependence: Secondary | ICD-10-CM | POA: Diagnosis not present

## 2015-04-19 DIAGNOSIS — E785 Hyperlipidemia, unspecified: Secondary | ICD-10-CM | POA: Diagnosis present

## 2015-04-19 DIAGNOSIS — I5022 Chronic systolic (congestive) heart failure: Secondary | ICD-10-CM | POA: Diagnosis present

## 2015-04-19 DIAGNOSIS — I252 Old myocardial infarction: Secondary | ICD-10-CM | POA: Diagnosis not present

## 2015-04-19 DIAGNOSIS — Z79899 Other long term (current) drug therapy: Secondary | ICD-10-CM | POA: Diagnosis not present

## 2015-04-19 DIAGNOSIS — I110 Hypertensive heart disease with heart failure: Secondary | ICD-10-CM | POA: Diagnosis present

## 2015-04-19 DIAGNOSIS — D72829 Elevated white blood cell count, unspecified: Secondary | ICD-10-CM | POA: Diagnosis not present

## 2015-04-19 DIAGNOSIS — J449 Chronic obstructive pulmonary disease, unspecified: Secondary | ICD-10-CM | POA: Diagnosis present

## 2015-04-19 DIAGNOSIS — R079 Chest pain, unspecified: Secondary | ICD-10-CM | POA: Diagnosis not present

## 2015-04-19 DIAGNOSIS — D72825 Bandemia: Secondary | ICD-10-CM | POA: Diagnosis not present

## 2015-05-04 ENCOUNTER — Ambulatory Visit (INDEPENDENT_AMBULATORY_CARE_PROVIDER_SITE_OTHER): Payer: Medicare Other | Admitting: Pulmonary Disease

## 2015-05-04 ENCOUNTER — Encounter: Payer: Self-pay | Admitting: Pulmonary Disease

## 2015-05-04 VITALS — BP 118/69 | HR 105 | Ht 64.0 in | Wt 77.0 lb

## 2015-05-04 DIAGNOSIS — J441 Chronic obstructive pulmonary disease with (acute) exacerbation: Secondary | ICD-10-CM | POA: Diagnosis not present

## 2015-05-04 DIAGNOSIS — J9611 Chronic respiratory failure with hypoxia: Secondary | ICD-10-CM | POA: Diagnosis not present

## 2015-05-04 MED ORDER — NYSTATIN 100000 UNIT/ML MT SUSP: 5.0000 mL | Freq: Two times a day (BID) | OROMUCOSAL | Status: DC

## 2015-05-04 MED ORDER — UMECLIDINIUM-VILANTEROL 62.5-25 MCG/INH IN AEPB: 1.0000 | INHALATION_SPRAY | Freq: Every day | RESPIRATORY_TRACT | Status: DC

## 2015-05-04 NOTE — Assessment & Plan Note (Signed)
You have thrush- Rx for Nysstatin 10,000 units-5 ML swish and swallow twice daily Discontinue Symbicort Samples of Anoro instead-call us for prescription of this works
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
## 2015-05-04 NOTE — Patient Instructions (Signed)
You have thrush- Rx for Nysstatin 10,000 units-5 ML swish and swallow twice daily Discontinue Symbicort Samples of Anoro instead-call us for prescription of this works

## 2015-05-04 NOTE — Assessment & Plan Note (Signed)
Continue oxygen during activity and sleep

## 2015-06-02 ENCOUNTER — Telehealth: Payer: Self-pay | Admitting: Pulmonary Disease

## 2015-06-02 MED ORDER — CLOTRIMAZOLE 1 % EX CREA: 1.0000 "application " | TOPICAL_CREAM | Freq: Four times a day (QID) | CUTANEOUS | Status: DC | PRN

## 2015-06-02 MED ORDER — UMECLIDINIUM-VILANTEROL 62.5-25 MCG/INH IN AEPB: 1.0000 | INHALATION_SPRAY | Freq: Every day | RESPIRATORY_TRACT | Status: DC

## 2015-06-02 NOTE — Telephone Encounter (Signed)
Called spoke with patient and discussed MW's recs as stated below.  Pt stated that she thrush is external, on her lips in the corners of her mouth.  Rx for the mycelex cream sent.  Rx sent for [facility] sent as well.  Pt is aware to contact the office if anything further is needed.  Will sign off.

## 2015-06-02 NOTE — Telephone Encounter (Signed)
Last ov 05/04/15 with RA  Patient Instructions       You have thrush- Rx for Nysstatin 10,000 units-5 ML swish and swallow twice daily Discontinue Symbicort Samples of Anoro instead-call us for prescription of this works   [employer] spoke with pt. She states that since starting Anoro that she has developed thrush in the corners of her mouth. She states that she was given something for it at her last ov. Verified pharmacy [facility]. I explained to her that I would send a message to MW in RA's absence. She voiced understanding and had no further questions.   MW please advise

## 2015-06-02 NOTE — Telephone Encounter (Signed)
If it's the outside skin and not inside the mouth best option is mycelex #15 gm tube qid prn, if inside the mouth renew the nystatin

## 2015-06-12 DIAGNOSIS — K222 Esophageal obstruction: Secondary | ICD-10-CM | POA: Diagnosis not present

## 2015-06-14 DIAGNOSIS — H2513 Age-related nuclear cataract, bilateral: Secondary | ICD-10-CM | POA: Diagnosis not present

## 2015-06-15 ENCOUNTER — Encounter: Payer: Self-pay | Admitting: Adult Health

## 2015-06-15 ENCOUNTER — Ambulatory Visit (INDEPENDENT_AMBULATORY_CARE_PROVIDER_SITE_OTHER): Payer: Medicare Other | Admitting: Adult Health

## 2015-06-15 VITALS — BP 134/72 | HR 63 | Temp 97.8°F | Ht 65.0 in | Wt 81.6 lb

## 2015-06-15 DIAGNOSIS — B3781 Candidal esophagitis: Secondary | ICD-10-CM | POA: Insufficient documentation

## 2015-06-15 DIAGNOSIS — B370 Candidal stomatitis: Secondary | ICD-10-CM | POA: Diagnosis not present

## 2015-06-15 DIAGNOSIS — J449 Chronic obstructive pulmonary disease, unspecified: Secondary | ICD-10-CM | POA: Diagnosis not present

## 2015-06-15 DIAGNOSIS — K229 Disease of esophagus, unspecified: Secondary | ICD-10-CM | POA: Insufficient documentation

## 2015-06-15 MED ORDER — TIOTROPIUM BROMIDE-OLODATEROL 2.5-2.5 MCG/ACT IN AERS: 2.0000 | INHALATION_SPRAY | Freq: Every day | RESPIRATORY_TRACT | Status: DC

## 2015-06-15 MED ORDER — FLUCONAZOLE 100 MG PO TABS: 100.0000 mg | ORAL_TABLET | Freq: Every day | ORAL | Status: DC

## 2015-06-15 NOTE — Assessment & Plan Note (Signed)
Recurrent /slow to resolve -contributing factors as frequent steroids, inhaler use, and Humira .  Advised on oral care/inhaler use.  Will change ICS to LAMA/LABA and change inhaler from Ellipta to respimat to see if not as irritating to post phaynx.  Add yogurt and probiotic   Plan  Try Armour Hammer Baking soda /peroxide toothpaste and mouthwash .  Brush rinse and gargle after inhaler use.  Drink sips of water to rinse food and liquids of throat.  STop ANORO and Symbicort.  Try Stiolto Respimat 2 puffs daily .  Diflucan 100mg  daily for 10 days .  Begin eating yogurt Twice daily  .  Begin Probiotic daily . Digestive Advantage gummie.  Follow up in 2 weeks and As needed   Please contact office for sooner follow up if symptoms do not improve or worsen or seek emergency care

## 2015-06-15 NOTE — Patient Instructions (Addendum)
Try Armour [employer] soda /peroxide toothpaste and mouthwash .  Brush rinse and gargle after inhaler use.  Drink sips of water to rinse food and liquids of throat.  STop ANORO and Symbicort.  Try Stiolto Respimat 2 puffs daily .  Diflucan 100mg  daily for 10 days .  Begin eating yogurt Twice daily  .  Begin Probiotic daily . Digestive Advantage gummie.  Follow up in 2 weeks and As needed   Please contact office for sooner follow up if symptoms do not improve or worsen or seek emergency care

## 2015-06-15 NOTE — Assessment & Plan Note (Signed)
Intolerant to inhaler due to recurrent thrush  Try to change to new LAMA/LABA   Plan  Try Armour Hammer Baking soda /peroxide toothpaste and mouthwash .  Brush rinse and gargle after inhaler use.  Drink sips of water to rinse food and liquids of throat.  STop ANORO and Symbicort.  Try Stiolto Respimat 2 puffs daily .  Diflucan 100mg  daily for 10 days .  Begin eating yogurt Twice daily  .  Begin Probiotic daily . Digestive Advantage gummie.  Follow up in 2 weeks and As needed   Please contact office for sooner follow up if symptoms do not improve or worsen or seek emergency care
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
## 2015-06-19 NOTE — Progress Notes (Signed)
Reviewed & agree with plan  

## 2015-06-23 DIAGNOSIS — H25812 Combined forms of age-related cataract, left eye: Secondary | ICD-10-CM | POA: Diagnosis not present

## 2015-06-23 DIAGNOSIS — H25811 Combined forms of age-related cataract, right eye: Secondary | ICD-10-CM | POA: Diagnosis not present

## 2015-06-29 ENCOUNTER — Telehealth: Payer: Self-pay | Admitting: Cardiology

## 2015-06-29 NOTE — Telephone Encounter (Signed)
Request for surgical clearance:  1. What type of surgery is being performed? Cataract Surgery  2. When is this surgery scheduled? 07-17-15   3. Are there any medications that need to be held prior to surgery and how long?General Cardiac Clearance   4. Name of physician performing surgery?Dr Julian Reil    5. What is your office phone and fax number? [phone] and fax number is [phone]  6.

## 2015-06-29 NOTE — Telephone Encounter (Signed)
Schedule paov to make sure pt not having symptoms. Kirk Ruths

## 2015-06-30 NOTE — Telephone Encounter (Signed)
Left message for pt to call to schedule Left message for dr Alanda Slim office, delay in clearance for pt to be seen.

## 2015-07-05 NOTE — Telephone Encounter (Signed)
Spoke with pt, Follow up scheduled  

## 2015-07-06 ENCOUNTER — Ambulatory Visit (INDEPENDENT_AMBULATORY_CARE_PROVIDER_SITE_OTHER): Payer: Medicare Other | Admitting: Physician Assistant

## 2015-07-06 ENCOUNTER — Ambulatory Visit (INDEPENDENT_AMBULATORY_CARE_PROVIDER_SITE_OTHER): Payer: Medicare Other | Admitting: Adult Health

## 2015-07-06 ENCOUNTER — Encounter: Payer: Self-pay | Admitting: Physician Assistant

## 2015-07-06 ENCOUNTER — Encounter: Payer: Self-pay | Admitting: Adult Health

## 2015-07-06 VITALS — BP 92/60 | HR 64 | Temp 97.7°F | Ht 64.0 in | Wt 81.0 lb

## 2015-07-06 VITALS — BP 90/50 | HR 67 | Ht 64.0 in | Wt 80.6 lb

## 2015-07-06 DIAGNOSIS — I2510 Atherosclerotic heart disease of native coronary artery without angina pectoris: Secondary | ICD-10-CM

## 2015-07-06 DIAGNOSIS — E785 Hyperlipidemia, unspecified: Secondary | ICD-10-CM | POA: Diagnosis not present

## 2015-07-06 DIAGNOSIS — B370 Candidal stomatitis: Secondary | ICD-10-CM | POA: Diagnosis not present

## 2015-07-06 DIAGNOSIS — I714 Abdominal aortic aneurysm, without rupture, unspecified: Secondary | ICD-10-CM

## 2015-07-06 DIAGNOSIS — Z01810 Encounter for preprocedural cardiovascular examination: Secondary | ICD-10-CM

## 2015-07-06 DIAGNOSIS — J449 Chronic obstructive pulmonary disease, unspecified: Secondary | ICD-10-CM

## 2015-07-06 MED ORDER — CARVEDILOL 12.5 MG PO TABS: 12.5000 mg | ORAL_TABLET | Freq: Two times a day (BID) | ORAL | Status: DC

## 2015-07-06 MED ORDER — LISINOPRIL 20 MG PO TABS: 20.0000 mg | ORAL_TABLET | Freq: Every day | ORAL | Status: DC

## 2015-07-06 MED ORDER — TIOTROPIUM BROMIDE-OLODATEROL 2.5-2.5 MCG/ACT IN AERS: 2.0000 | INHALATION_SPRAY | Freq: Every day | RESPIRATORY_TRACT | Status: DC

## 2015-07-06 NOTE — Patient Instructions (Addendum)
Use  Armour Hammer Baking soda /peroxide toothpaste and mouthwash As needed  .  Brush rinse and gargle after inhaler use.  Drink sips of water to rinse food and liquids off throat.  Continue on Stiolto Respimat 2 puffs daily .  Follow up in 2 weeks and As needed   Follow up with Dr. Elsworth Soho  In 3-4 months and As needed   Please contact office for sooner follow up if symptoms do not improve or worsen or seek emergency care

## 2015-07-06 NOTE — Patient Instructions (Signed)
Medication Instructions:  1) DECREASE Carvedilol to 12.5mg  twice daily 2) DECREASE Lisinopril to 20mg  once daily  Labwork: None  Testing/Procedures: None  Follow-Up: Follow up in October with Dr. Stanford Breed.  Any Other Special Instructions Will Be Listed Below (If Applicable).  Monitor your blood pressure at home and let us know if you have any significant changes.  You have been cleared for surgery.  We will send clearance to their office.  If you need a refill on your cardiac medications before your next appointment, please call your pharmacy.
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
## 2015-07-06 NOTE — Assessment & Plan Note (Signed)
Resolved with diflucan  Inhaler education given .

## 2015-07-06 NOTE — Assessment & Plan Note (Signed)
Doing well on current regimen   Plan  Use  [employer] Baking soda /peroxide toothpaste and mouthwash As needed  .  Brush rinse and gargle after inhaler use.  Drink sips of water to rinse food and liquids off throat.  Continue on Stiolto Respimat 2 puffs daily .  Follow up in 2 weeks and As needed   Follow up with Dr. Elsworth Soho  In 3-4 months and As needed   Please contact office for sooner follow up if symptoms do not improve or worsen or seek emergency care

## 2015-07-07 NOTE — Progress Notes (Signed)
Reviewed & agree with plan  

## 2015-07-11 DIAGNOSIS — L819 Disorder of pigmentation, unspecified: Secondary | ICD-10-CM | POA: Diagnosis not present

## 2015-07-11 DIAGNOSIS — L4050 Arthropathic psoriasis, unspecified: Secondary | ICD-10-CM | POA: Diagnosis not present

## 2015-07-11 DIAGNOSIS — L409 Psoriasis, unspecified: Secondary | ICD-10-CM | POA: Diagnosis not present

## 2015-07-17 DIAGNOSIS — H25812 Combined forms of age-related cataract, left eye: Secondary | ICD-10-CM | POA: Diagnosis not present

## 2015-07-17 DIAGNOSIS — H2512 Age-related nuclear cataract, left eye: Secondary | ICD-10-CM | POA: Diagnosis not present

## 2015-07-21 DIAGNOSIS — L309 Dermatitis, unspecified: Secondary | ICD-10-CM | POA: Diagnosis not present

## 2015-07-21 DIAGNOSIS — L011 Impetiginization of other dermatoses: Secondary | ICD-10-CM | POA: Diagnosis not present

## 2015-07-26 DIAGNOSIS — R21 Rash and other nonspecific skin eruption: Secondary | ICD-10-CM | POA: Diagnosis not present

## 2015-07-26 DIAGNOSIS — L309 Dermatitis, unspecified: Secondary | ICD-10-CM | POA: Diagnosis not present

## 2015-07-28 DIAGNOSIS — K222 Esophageal obstruction: Secondary | ICD-10-CM | POA: Diagnosis not present

## 2015-08-03 DIAGNOSIS — L309 Dermatitis, unspecified: Secondary | ICD-10-CM | POA: Diagnosis not present

## 2015-08-31 DIAGNOSIS — Z886 Allergy status to analgesic agent status: Secondary | ICD-10-CM | POA: Diagnosis not present

## 2015-08-31 DIAGNOSIS — I252 Old myocardial infarction: Secondary | ICD-10-CM | POA: Diagnosis not present

## 2015-08-31 DIAGNOSIS — E785 Hyperlipidemia, unspecified: Secondary | ICD-10-CM | POA: Diagnosis not present

## 2015-08-31 DIAGNOSIS — M1990 Unspecified osteoarthritis, unspecified site: Secondary | ICD-10-CM | POA: Diagnosis not present

## 2015-08-31 DIAGNOSIS — B3781 Candidal esophagitis: Secondary | ICD-10-CM | POA: Diagnosis not present

## 2015-08-31 DIAGNOSIS — K2210 Ulcer of esophagus without bleeding: Secondary | ICD-10-CM | POA: Diagnosis not present

## 2015-08-31 DIAGNOSIS — I509 Heart failure, unspecified: Secondary | ICD-10-CM | POA: Diagnosis not present

## 2015-08-31 DIAGNOSIS — K228 Other specified diseases of esophagus: Secondary | ICD-10-CM | POA: Diagnosis not present

## 2015-08-31 DIAGNOSIS — I2510 Atherosclerotic heart disease of native coronary artery without angina pectoris: Secondary | ICD-10-CM | POA: Diagnosis not present

## 2015-08-31 DIAGNOSIS — I110 Hypertensive heart disease with heart failure: Secondary | ICD-10-CM | POA: Diagnosis not present

## 2015-08-31 DIAGNOSIS — K222 Esophageal obstruction: Secondary | ICD-10-CM | POA: Diagnosis not present

## 2015-08-31 DIAGNOSIS — K209 Esophagitis, unspecified: Secondary | ICD-10-CM | POA: Diagnosis not present

## 2015-08-31 DIAGNOSIS — J449 Chronic obstructive pulmonary disease, unspecified: Secondary | ICD-10-CM | POA: Diagnosis not present

## 2015-09-04 DIAGNOSIS — E559 Vitamin D deficiency, unspecified: Secondary | ICD-10-CM | POA: Diagnosis not present

## 2015-09-04 DIAGNOSIS — R946 Abnormal results of thyroid function studies: Secondary | ICD-10-CM | POA: Diagnosis not present

## 2015-09-04 DIAGNOSIS — E041 Nontoxic single thyroid nodule: Secondary | ICD-10-CM | POA: Diagnosis not present

## 2015-09-04 DIAGNOSIS — I5022 Chronic systolic (congestive) heart failure: Secondary | ICD-10-CM | POA: Diagnosis not present

## 2015-09-04 DIAGNOSIS — D6489 Other specified anemias: Secondary | ICD-10-CM | POA: Diagnosis not present

## 2015-09-04 DIAGNOSIS — K222 Esophageal obstruction: Secondary | ICD-10-CM | POA: Diagnosis not present

## 2015-09-04 DIAGNOSIS — I714 Abdominal aortic aneurysm, without rupture: Secondary | ICD-10-CM | POA: Diagnosis not present

## 2015-09-04 DIAGNOSIS — J438 Other emphysema: Secondary | ICD-10-CM | POA: Diagnosis not present

## 2015-09-04 DIAGNOSIS — I10 Essential (primary) hypertension: Secondary | ICD-10-CM | POA: Diagnosis not present

## 2015-09-16 ENCOUNTER — Other Ambulatory Visit: Payer: Self-pay | Admitting: Adult Health

## 2015-10-09 DIAGNOSIS — K222 Esophageal obstruction: Secondary | ICD-10-CM | POA: Diagnosis not present

## 2015-10-11 DIAGNOSIS — R21 Rash and other nonspecific skin eruption: Secondary | ICD-10-CM | POA: Diagnosis not present

## 2015-10-11 DIAGNOSIS — L4050 Arthropathic psoriasis, unspecified: Secondary | ICD-10-CM | POA: Diagnosis not present

## 2015-10-11 DIAGNOSIS — L409 Psoriasis, unspecified: Secondary | ICD-10-CM | POA: Diagnosis not present

## 2015-10-11 DIAGNOSIS — L819 Disorder of pigmentation, unspecified: Secondary | ICD-10-CM | POA: Diagnosis not present

## 2015-10-18 DIAGNOSIS — J449 Chronic obstructive pulmonary disease, unspecified: Secondary | ICD-10-CM | POA: Diagnosis not present

## 2015-10-18 DIAGNOSIS — I10 Essential (primary) hypertension: Secondary | ICD-10-CM | POA: Diagnosis not present

## 2015-10-18 DIAGNOSIS — I2510 Atherosclerotic heart disease of native coronary artery without angina pectoris: Secondary | ICD-10-CM | POA: Diagnosis not present

## 2015-10-18 DIAGNOSIS — B3781 Candidal esophagitis: Secondary | ICD-10-CM | POA: Diagnosis not present

## 2015-10-18 DIAGNOSIS — K922 Gastrointestinal hemorrhage, unspecified: Secondary | ICD-10-CM | POA: Diagnosis not present

## 2015-10-18 DIAGNOSIS — K222 Esophageal obstruction: Secondary | ICD-10-CM | POA: Diagnosis not present

## 2015-10-18 DIAGNOSIS — K228 Other specified diseases of esophagus: Secondary | ICD-10-CM | POA: Diagnosis not present

## 2015-10-18 DIAGNOSIS — R1310 Dysphagia, unspecified: Secondary | ICD-10-CM | POA: Diagnosis not present

## 2015-10-18 DIAGNOSIS — K219 Gastro-esophageal reflux disease without esophagitis: Secondary | ICD-10-CM | POA: Diagnosis not present

## 2015-10-18 DIAGNOSIS — Z886 Allergy status to analgesic agent status: Secondary | ICD-10-CM | POA: Diagnosis not present

## 2015-10-18 DIAGNOSIS — I252 Old myocardial infarction: Secondary | ICD-10-CM | POA: Diagnosis not present

## 2015-10-19 ENCOUNTER — Encounter: Payer: Self-pay | Admitting: Pulmonary Disease

## 2015-10-19 ENCOUNTER — Ambulatory Visit (INDEPENDENT_AMBULATORY_CARE_PROVIDER_SITE_OTHER): Payer: Medicare Other | Admitting: Pulmonary Disease

## 2015-10-19 DIAGNOSIS — I2510 Atherosclerotic heart disease of native coronary artery without angina pectoris: Secondary | ICD-10-CM | POA: Diagnosis not present

## 2015-10-19 DIAGNOSIS — B3781 Candidal esophagitis: Secondary | ICD-10-CM | POA: Diagnosis not present

## 2015-10-19 DIAGNOSIS — J449 Chronic obstructive pulmonary disease, unspecified: Secondary | ICD-10-CM

## 2015-10-19 MED ORDER — TIOTROPIUM BROMIDE-OLODATEROL 2.5-2.5 MCG/ACT IN AERS: 2.0000 | INHALATION_SPRAY | Freq: Every day | RESPIRATORY_TRACT | 3 refills | Status: DC

## 2015-10-19 NOTE — Assessment & Plan Note (Signed)
Complete course of Diflucan for 2 weeks Not on any steroid inhalers

## 2015-10-19 NOTE — Addendum Note (Signed)
Addended by: Mathis Dad on: 10/19/2015 05:19 PM   Modules accepted: Orders

## 2015-10-19 NOTE — Progress Notes (Signed)
   Subjective:    Patient ID: Tonya Harper, female    DOB: 04-14-1945, 70 y.o.   MRN: [id number]  HPI  70 yo female with GOLD C COPD on O2 -has RA on humira (dr saeed) 20 Pyrs , quit 2013 History of esophageal stricture requiring dilatation  10/19/2015  Chief Complaint  Patient presents with  . Follow-up    breathing is doing okay.  pt had endoscopy done and it showed Candidiasis in her esophagus which they think is caused by inhalers.    She underwent EGD at [facility] which showed esophageal candidiasis-she had been treated for oral candidiasis in the past with nysstatin. She was started on Stiolto a few months ago and taken off the steroid inhalers. Her breathing is at baseline.  She is now off Humira-never had any arthritis She takes prednisone every now and then for maculopapular rash-advised by dermatology  She is on lisinopril does not have cough   Significant tests/ events  PFT 01/2011: FeV1 38% FVC 72% DLCO 41% TLC 62% Spirometry 04/2015 -FEV1 29% with a ratio of 33 and FVC of 68%   CT chest 04/2014- resolved right middle lobe nodule, right thyroid mass, thickening of distal esophagus    Review of Systems     Objective:   Physical Exam        Assessment & Plan:

## 2015-10-19 NOTE — Assessment & Plan Note (Signed)
Stay on STIOLTO 2 puffs daily Use albuterol as needed only  Flu shot recommended

## 2015-10-19 NOTE — Patient Instructions (Signed)
Stay on STIOLTO 2 puffs daily Use albuterol as needed only  Flu shot recommended  Complete course of Diflucan for 2 weeks

## 2015-11-08 DIAGNOSIS — I2510 Atherosclerotic heart disease of native coronary artery without angina pectoris: Secondary | ICD-10-CM | POA: Diagnosis not present

## 2015-11-08 DIAGNOSIS — I10 Essential (primary) hypertension: Secondary | ICD-10-CM | POA: Diagnosis not present

## 2015-11-08 DIAGNOSIS — I252 Old myocardial infarction: Secondary | ICD-10-CM | POA: Diagnosis not present

## 2015-11-08 DIAGNOSIS — J449 Chronic obstructive pulmonary disease, unspecified: Secondary | ICD-10-CM | POA: Diagnosis not present

## 2015-11-08 DIAGNOSIS — R1310 Dysphagia, unspecified: Secondary | ICD-10-CM | POA: Diagnosis not present

## 2015-11-08 DIAGNOSIS — K222 Esophageal obstruction: Secondary | ICD-10-CM | POA: Diagnosis not present

## 2015-11-08 DIAGNOSIS — B3781 Candidal esophagitis: Secondary | ICD-10-CM | POA: Diagnosis not present

## 2015-11-08 DIAGNOSIS — K209 Esophagitis, unspecified: Secondary | ICD-10-CM | POA: Diagnosis not present
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
## 2015-11-15 DIAGNOSIS — H35373 Puckering of macula, bilateral: Secondary | ICD-10-CM | POA: Diagnosis not present

## 2015-11-15 DIAGNOSIS — H2511 Age-related nuclear cataract, right eye: Secondary | ICD-10-CM | POA: Diagnosis not present

## 2015-11-15 DIAGNOSIS — H524 Presbyopia: Secondary | ICD-10-CM | POA: Diagnosis not present

## 2015-11-15 DIAGNOSIS — H26492 Other secondary cataract, left eye: Secondary | ICD-10-CM | POA: Diagnosis not present

## 2015-11-21 DIAGNOSIS — E0590 Thyrotoxicosis, unspecified without thyrotoxic crisis or storm: Secondary | ICD-10-CM | POA: Diagnosis not present

## 2015-11-21 DIAGNOSIS — R21 Rash and other nonspecific skin eruption: Secondary | ICD-10-CM | POA: Diagnosis not present

## 2015-11-21 DIAGNOSIS — L4050 Arthropathic psoriasis, unspecified: Secondary | ICD-10-CM | POA: Diagnosis not present

## 2015-11-21 DIAGNOSIS — E010 Iodine-deficiency related diffuse (endemic) goiter: Secondary | ICD-10-CM | POA: Diagnosis not present

## 2015-11-21 DIAGNOSIS — L819 Disorder of pigmentation, unspecified: Secondary | ICD-10-CM | POA: Diagnosis not present

## 2015-11-22 ENCOUNTER — Encounter: Payer: Self-pay | Admitting: Cardiology

## 2015-11-22 ENCOUNTER — Ambulatory Visit (INDEPENDENT_AMBULATORY_CARE_PROVIDER_SITE_OTHER): Payer: Medicare Other | Admitting: Cardiology

## 2015-11-22 ENCOUNTER — Ambulatory Visit (HOSPITAL_BASED_OUTPATIENT_CLINIC_OR_DEPARTMENT_OTHER)
Admission: RE | Admit: 2015-11-22 | Discharge: 2015-11-22 | Disposition: A | Discharge status: Home / Self Care | Payer: Medicare Other | Source: Ambulatory Visit | Attending: Cardiology | Admitting: Cardiology

## 2015-11-22 VITALS — BP 154/72 | HR 68 | Ht 64.0 in | Wt 80.8 lb

## 2015-11-22 DIAGNOSIS — I700 Atherosclerosis of aorta: Secondary | ICD-10-CM | POA: Insufficient documentation

## 2015-11-22 DIAGNOSIS — I2510 Atherosclerotic heart disease of native coronary artery without angina pectoris: Secondary | ICD-10-CM

## 2015-11-22 DIAGNOSIS — I714 Abdominal aortic aneurysm, without rupture, unspecified: Secondary | ICD-10-CM

## 2015-11-22 DIAGNOSIS — I10 Essential (primary) hypertension: Secondary | ICD-10-CM

## 2015-11-22 DIAGNOSIS — E7800 Pure hypercholesterolemia, unspecified: Secondary | ICD-10-CM

## 2015-11-22 MED ORDER — ASPIRIN EC 81 MG PO TBEC: 81.0000 mg | DELAYED_RELEASE_TABLET | Freq: Every day | ORAL | 3 refills | Status: DC

## 2015-11-22 NOTE — Patient Instructions (Signed)
Medication Instructions:   START ASPIRIN 81 MG ONCE DAILY  Testing/Procedures:  Your physician has requested that you have an abdominal aorta duplex. During this test, an ultrasound is used to evaluate the aorta. Allow 30 minutes for this exam. Do not eat after midnight the day before and avoid carbonated beverages   Follow-Up:  Your physician wants you to follow-up in: [place] will receive a reminder letter in the mail two months in advance. If you don't receive a letter, please call our office to schedule the follow-up appointment.   If you need a refill on your cardiac medications before your next appointment, please call your pharmacy.

## 2015-11-23 ENCOUNTER — Telehealth: Payer: Self-pay | Admitting: *Deleted

## 2015-11-23 DIAGNOSIS — I739 Peripheral vascular disease, unspecified: Secondary | ICD-10-CM

## 2015-11-23 NOTE — Telephone Encounter (Signed)
Spoke with pt, she is aware of abdominal US results. She reports remote hx of PAD and that she has trouble with pain in her calf whne walking. She reports in the past there has been trouble finding the pulses in her feet. Discussed with dr Stanford Breed, order placed for LEA dopplers.

## 2015-11-24 ENCOUNTER — Other Ambulatory Visit (HOSPITAL_COMMUNITY): Payer: Self-pay | Admitting: Endocrinology

## 2015-11-24 DIAGNOSIS — E0590 Thyrotoxicosis, unspecified without thyrotoxic crisis or storm: Secondary | ICD-10-CM

## 2015-11-28 DIAGNOSIS — H26492 Other secondary cataract, left eye: Secondary | ICD-10-CM | POA: Diagnosis not present

## 2015-11-29 ENCOUNTER — Other Ambulatory Visit: Payer: Self-pay | Admitting: Cardiology

## 2015-11-29 DIAGNOSIS — I2510 Atherosclerotic heart disease of native coronary artery without angina pectoris: Secondary | ICD-10-CM | POA: Diagnosis not present

## 2015-11-29 DIAGNOSIS — I252 Old myocardial infarction: Secondary | ICD-10-CM | POA: Diagnosis not present

## 2015-11-29 DIAGNOSIS — R1310 Dysphagia, unspecified: Secondary | ICD-10-CM | POA: Diagnosis not present

## 2015-11-29 DIAGNOSIS — I739 Peripheral vascular disease, unspecified: Secondary | ICD-10-CM

## 2015-11-29 DIAGNOSIS — B3781 Candidal esophagitis: Secondary | ICD-10-CM | POA: Diagnosis not present

## 2015-11-29 DIAGNOSIS — K222 Esophageal obstruction: Secondary | ICD-10-CM | POA: Diagnosis not present

## 2015-11-29 DIAGNOSIS — I10 Essential (primary) hypertension: Secondary | ICD-10-CM | POA: Diagnosis not present

## 2015-11-29 DIAGNOSIS — J45909 Unspecified asthma, uncomplicated: Secondary | ICD-10-CM | POA: Diagnosis not present

## 2015-11-29 DIAGNOSIS — K3189 Other diseases of stomach and duodenum: Secondary | ICD-10-CM | POA: Diagnosis not present

## 2015-11-29 DIAGNOSIS — J449 Chronic obstructive pulmonary disease, unspecified: Secondary | ICD-10-CM | POA: Diagnosis not present

## 2015-12-06 DIAGNOSIS — E46 Unspecified protein-calorie malnutrition: Secondary | ICD-10-CM | POA: Diagnosis not present

## 2015-12-06 DIAGNOSIS — D6489 Other specified anemias: Secondary | ICD-10-CM | POA: Diagnosis not present

## 2015-12-06 DIAGNOSIS — Z0001 Encounter for general adult medical examination with abnormal findings: Secondary | ICD-10-CM | POA: Diagnosis not present

## 2015-12-06 DIAGNOSIS — E559 Vitamin D deficiency, unspecified: Secondary | ICD-10-CM | POA: Diagnosis not present

## 2015-12-06 DIAGNOSIS — I10 Essential (primary) hypertension: Secondary | ICD-10-CM | POA: Diagnosis not present

## 2015-12-06 DIAGNOSIS — E0590 Thyrotoxicosis, unspecified without thyrotoxic crisis or storm: Secondary | ICD-10-CM | POA: Diagnosis not present

## 2015-12-06 DIAGNOSIS — I2510 Atherosclerotic heart disease of native coronary artery without angina pectoris: Secondary | ICD-10-CM | POA: Diagnosis not present

## 2015-12-06 DIAGNOSIS — E042 Nontoxic multinodular goiter: Secondary | ICD-10-CM | POA: Diagnosis not present

## 2015-12-06 DIAGNOSIS — E041 Nontoxic single thyroid nodule: Secondary | ICD-10-CM | POA: Diagnosis not present

## 2015-12-06 DIAGNOSIS — K635 Polyp of colon: Secondary | ICD-10-CM | POA: Diagnosis not present

## 2015-12-11 ENCOUNTER — Ambulatory Visit (HOSPITAL_COMMUNITY)
Admission: RE | Admit: 2015-12-11 | Discharge: 2015-12-11 | Disposition: A | Discharge status: Home / Self Care | Payer: Medicare Other | Source: Ambulatory Visit | Attending: Cardiovascular Disease | Admitting: Cardiovascular Disease

## 2015-12-11 DIAGNOSIS — I700 Atherosclerosis of aorta: Secondary | ICD-10-CM | POA: Insufficient documentation

## 2015-12-11 DIAGNOSIS — E785 Hyperlipidemia, unspecified: Secondary | ICD-10-CM | POA: Insufficient documentation

## 2015-12-11 DIAGNOSIS — I10 Essential (primary) hypertension: Secondary | ICD-10-CM | POA: Diagnosis not present

## 2015-12-11 DIAGNOSIS — I739 Peripheral vascular disease, unspecified: Secondary | ICD-10-CM

## 2015-12-11 DIAGNOSIS — J449 Chronic obstructive pulmonary disease, unspecified: Secondary | ICD-10-CM | POA: Insufficient documentation

## 2015-12-11 DIAGNOSIS — I70203 Unspecified atherosclerosis of native arteries of extremities, bilateral legs: Secondary | ICD-10-CM | POA: Diagnosis not present

## 2015-12-11 DIAGNOSIS — I2510 Atherosclerotic heart disease of native coronary artery without angina pectoris: Secondary | ICD-10-CM | POA: Diagnosis not present

## 2015-12-12 ENCOUNTER — Other Ambulatory Visit: Payer: Self-pay | Admitting: Cardiology

## 2015-12-12 DIAGNOSIS — E785 Hyperlipidemia, unspecified: Secondary | ICD-10-CM

## 2015-12-19 ENCOUNTER — Ambulatory Visit (HOSPITAL_COMMUNITY): Payer: Medicare Other

## 2015-12-20 ENCOUNTER — Other Ambulatory Visit (HOSPITAL_COMMUNITY): Payer: Medicare Other

## 2015-12-20 DIAGNOSIS — Z79899 Other long term (current) drug therapy: Secondary | ICD-10-CM | POA: Diagnosis not present

## 2015-12-20 DIAGNOSIS — K219 Gastro-esophageal reflux disease without esophagitis: Secondary | ICD-10-CM | POA: Diagnosis not present

## 2015-12-20 DIAGNOSIS — E785 Hyperlipidemia, unspecified: Secondary | ICD-10-CM | POA: Diagnosis not present

## 2015-12-20 DIAGNOSIS — K222 Esophageal obstruction: Secondary | ICD-10-CM | POA: Diagnosis not present

## 2015-12-20 DIAGNOSIS — I2510 Atherosclerotic heart disease of native coronary artery without angina pectoris: Secondary | ICD-10-CM | POA: Diagnosis not present

## 2015-12-20 DIAGNOSIS — R1310 Dysphagia, unspecified: Secondary | ICD-10-CM | POA: Diagnosis not present

## 2015-12-20 DIAGNOSIS — Z887 Allergy status to serum and vaccine status: Secondary | ICD-10-CM | POA: Diagnosis not present

## 2015-12-20 DIAGNOSIS — Z886 Allergy status to analgesic agent status: Secondary | ICD-10-CM | POA: Diagnosis not present

## 2015-12-20 DIAGNOSIS — Z7952 Long term (current) use of systemic steroids: Secondary | ICD-10-CM | POA: Diagnosis not present

## 2015-12-20 DIAGNOSIS — Z7982 Long term (current) use of aspirin: Secondary | ICD-10-CM | POA: Diagnosis not present

## 2015-12-20 DIAGNOSIS — I509 Heart failure, unspecified: Secondary | ICD-10-CM | POA: Diagnosis not present

## 2015-12-20 DIAGNOSIS — J449 Chronic obstructive pulmonary disease, unspecified: Secondary | ICD-10-CM | POA: Diagnosis not present

## 2015-12-20 DIAGNOSIS — I252 Old myocardial infarction: Secondary | ICD-10-CM | POA: Diagnosis not present

## 2015-12-20 DIAGNOSIS — M1990 Unspecified osteoarthritis, unspecified site: Secondary | ICD-10-CM | POA: Diagnosis not present

## 2015-12-20 DIAGNOSIS — I110 Hypertensive heart disease with heart failure: Secondary | ICD-10-CM | POA: Diagnosis not present

## 2015-12-20 DIAGNOSIS — Z7951 Long term (current) use of inhaled steroids: Secondary | ICD-10-CM | POA: Diagnosis not present

## 2015-12-27 ENCOUNTER — Encounter (HOSPITAL_COMMUNITY)
Admission: RE | Admit: 2015-12-27 | Discharge: 2015-12-27 | Disposition: A | Discharge status: Home / Self Care | Payer: Medicare Other | Source: Ambulatory Visit | Attending: Endocrinology | Admitting: Endocrinology

## 2015-12-27 DIAGNOSIS — E0590 Thyrotoxicosis, unspecified without thyrotoxic crisis or storm: Secondary | ICD-10-CM

## 2015-12-27 MED ORDER — SODIUM IODIDE I 131 CAPSULE
12.3000 | Freq: Once | INTRAVENOUS | Status: AC | PRN
  Administered 2015-12-27: 12.3 via ORAL
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
## 2015-12-28 ENCOUNTER — Encounter (HOSPITAL_COMMUNITY)
Admission: RE | Admit: 2015-12-28 | Discharge: 2015-12-28 | Disposition: A | Discharge status: Home / Self Care | Payer: Medicare Other | Source: Ambulatory Visit | Attending: Endocrinology | Admitting: Endocrinology

## 2015-12-28 DIAGNOSIS — E0590 Thyrotoxicosis, unspecified without thyrotoxic crisis or storm: Secondary | ICD-10-CM | POA: Diagnosis not present

## 2015-12-28 DIAGNOSIS — R1310 Dysphagia, unspecified: Secondary | ICD-10-CM | POA: Diagnosis not present

## 2015-12-28 MED ORDER — SODIUM PERTECHNETATE TC 99M INJECTION
9.1000 | Freq: Once | INTRAVENOUS | Status: AC | PRN
  Administered 2015-12-28: 9.1 via INTRAVENOUS

## 2016-01-03 ENCOUNTER — Encounter: Payer: Self-pay | Admitting: Cardiology

## 2016-01-03 ENCOUNTER — Ambulatory Visit (INDEPENDENT_AMBULATORY_CARE_PROVIDER_SITE_OTHER): Payer: Medicare Other | Admitting: Cardiology

## 2016-01-03 VITALS — BP 124/66 | HR 70 | Ht 64.0 in | Wt 81.8 lb

## 2016-01-03 DIAGNOSIS — I10 Essential (primary) hypertension: Secondary | ICD-10-CM

## 2016-01-03 DIAGNOSIS — I2510 Atherosclerotic heart disease of native coronary artery without angina pectoris: Secondary | ICD-10-CM

## 2016-01-03 DIAGNOSIS — I739 Peripheral vascular disease, unspecified: Secondary | ICD-10-CM

## 2016-01-03 DIAGNOSIS — E7800 Pure hypercholesterolemia, unspecified: Secondary | ICD-10-CM

## 2016-01-03 NOTE — Patient Instructions (Signed)
Your physician wants you to follow-up in: 6 MONTHS WITH DR CRENSHAW You will receive a reminder letter in the mail two months in advance. If you don't receive a letter, please call our office to schedule the follow-up appointment.  

## 2016-01-09 DIAGNOSIS — E0590 Thyrotoxicosis, unspecified without thyrotoxic crisis or storm: Secondary | ICD-10-CM | POA: Diagnosis not present

## 2016-01-15 ENCOUNTER — Other Ambulatory Visit (HOSPITAL_COMMUNITY): Payer: Self-pay | Admitting: Endocrinology

## 2016-01-15 DIAGNOSIS — E0590 Thyrotoxicosis, unspecified without thyrotoxic crisis or storm: Secondary | ICD-10-CM

## 2016-01-17 DIAGNOSIS — E785 Hyperlipidemia, unspecified: Secondary | ICD-10-CM | POA: Diagnosis not present

## 2016-01-17 DIAGNOSIS — Z7982 Long term (current) use of aspirin: Secondary | ICD-10-CM | POA: Diagnosis not present

## 2016-01-17 DIAGNOSIS — I509 Heart failure, unspecified: Secondary | ICD-10-CM | POA: Diagnosis not present

## 2016-01-17 DIAGNOSIS — K222 Esophageal obstruction: Secondary | ICD-10-CM | POA: Diagnosis not present

## 2016-01-17 DIAGNOSIS — Z886 Allergy status to analgesic agent status: Secondary | ICD-10-CM | POA: Diagnosis not present

## 2016-01-17 DIAGNOSIS — I252 Old myocardial infarction: Secondary | ICD-10-CM | POA: Diagnosis not present

## 2016-01-17 DIAGNOSIS — I110 Hypertensive heart disease with heart failure: Secondary | ICD-10-CM | POA: Diagnosis not present

## 2016-01-17 DIAGNOSIS — J45909 Unspecified asthma, uncomplicated: Secondary | ICD-10-CM | POA: Diagnosis not present

## 2016-01-17 DIAGNOSIS — Z79899 Other long term (current) drug therapy: Secondary | ICD-10-CM | POA: Diagnosis not present

## 2016-01-17 DIAGNOSIS — Z887 Allergy status to serum and vaccine status: Secondary | ICD-10-CM | POA: Diagnosis not present

## 2016-01-17 DIAGNOSIS — Z7952 Long term (current) use of systemic steroids: Secondary | ICD-10-CM | POA: Diagnosis not present

## 2016-01-17 DIAGNOSIS — Z7951 Long term (current) use of inhaled steroids: Secondary | ICD-10-CM | POA: Diagnosis not present

## 2016-01-17 DIAGNOSIS — R1310 Dysphagia, unspecified: Secondary | ICD-10-CM | POA: Diagnosis not present

## 2016-01-17 DIAGNOSIS — K219 Gastro-esophageal reflux disease without esophagitis: Secondary | ICD-10-CM | POA: Diagnosis not present

## 2016-01-17 DIAGNOSIS — B3781 Candidal esophagitis: Secondary | ICD-10-CM | POA: Diagnosis not present

## 2016-01-17 DIAGNOSIS — I2510 Atherosclerotic heart disease of native coronary artery without angina pectoris: Secondary | ICD-10-CM | POA: Diagnosis not present

## 2016-01-17 DIAGNOSIS — J449 Chronic obstructive pulmonary disease, unspecified: Secondary | ICD-10-CM | POA: Diagnosis not present

## 2016-01-19 DIAGNOSIS — Z1231 Encounter for screening mammogram for malignant neoplasm of breast: Secondary | ICD-10-CM | POA: Diagnosis not present

## 2016-01-24 ENCOUNTER — Telehealth: Payer: Self-pay | Admitting: Pulmonary Disease

## 2016-01-24 DIAGNOSIS — J449 Chronic obstructive pulmonary disease, unspecified: Secondary | ICD-10-CM

## 2016-01-24 DIAGNOSIS — B3781 Candidal esophagitis: Secondary | ICD-10-CM

## 2016-01-24 DIAGNOSIS — H2511 Age-related nuclear cataract, right eye: Secondary | ICD-10-CM | POA: Diagnosis not present

## 2016-01-24 MED ORDER — TIOTROPIUM BROMIDE-OLODATEROL 2.5-2.5 MCG/ACT IN AERS
2.0000 | INHALATION_SPRAY | Freq: Every day | RESPIRATORY_TRACT | 3 refills | Status: DC
Start: 2016-01-24 — End: 2017-04-10

## 2016-01-24 NOTE — Telephone Encounter (Signed)
Pt requesting refill on stiolto- 90 day supply.  This has been sent to preferred pharmacy.    Nothing further needed.

## 2016-01-25 ENCOUNTER — Ambulatory Visit (HOSPITAL_COMMUNITY)
Admission: RE | Admit: 2016-01-25 | Discharge: 2016-01-25 | Disposition: A | Discharge status: Home / Self Care | Payer: Medicare Other | Source: Ambulatory Visit | Attending: Endocrinology | Admitting: Endocrinology

## 2016-01-25 DIAGNOSIS — E041 Nontoxic single thyroid nodule: Secondary | ICD-10-CM | POA: Diagnosis not present

## 2016-01-25 DIAGNOSIS — E0590 Thyrotoxicosis, unspecified without thyrotoxic crisis or storm: Secondary | ICD-10-CM

## 2016-01-25 DIAGNOSIS — E010 Iodine-deficiency related diffuse (endemic) goiter: Secondary | ICD-10-CM | POA: Diagnosis not present

## 2016-01-25 MED ORDER — SODIUM IODIDE I 131 CAPSULE
32.1000 | Freq: Once | INTRAVENOUS | Status: AC | PRN
  Administered 2016-01-25: 32.1 via ORAL

## 2016-01-31 ENCOUNTER — Other Ambulatory Visit: Payer: Self-pay | Admitting: Cardiology

## 2016-01-31 ENCOUNTER — Encounter: Payer: Self-pay | Admitting: Cardiovascular Disease

## 2016-01-31 ENCOUNTER — Ambulatory Visit (INDEPENDENT_AMBULATORY_CARE_PROVIDER_SITE_OTHER): Payer: Medicare Other | Admitting: Cardiovascular Disease

## 2016-01-31 VITALS — BP 106/61 | HR 71 | Ht 64.0 in | Wt 80.8 lb

## 2016-01-31 DIAGNOSIS — I739 Peripheral vascular disease, unspecified: Secondary | ICD-10-CM | POA: Insufficient documentation

## 2016-01-31 DIAGNOSIS — I519 Heart disease, unspecified: Secondary | ICD-10-CM

## 2016-01-31 MED ORDER — AMLODIPINE BESYLATE 5 MG PO TABS: 5.0000 mg | ORAL_TABLET | Freq: Every day | ORAL | 3 refills | Status: DC

## 2016-01-31 MED ORDER — CILOSTAZOL 50 MG PO TABS: 50.0000 mg | ORAL_TABLET | Freq: Two times a day (BID) | ORAL | 3 refills | Status: DC

## 2016-01-31 NOTE — Patient Instructions (Signed)
Medication Instructions: START Pletal 50 mg twice daily.   Testing/Procedures: Your physician has requested that you have an echocardiogram. Echocardiography is a painless test that uses sound waves to create images of your heart. It provides your doctor with information about the size and shape of your heart and how well your heart's chambers and valves are working. This procedure takes approximately one hour. There are no restrictions for this procedure.   Follow-Up: Your physician recommends that you schedule a follow-up appointment in: 2 months with Dr. Gwenlyn Found.  If you need a refill on your cardiac medications before your next appointment, please call your pharmacy.

## 2016-01-31 NOTE — Progress Notes (Signed)
01/31/2016 [name]   [birth year]  [id number]  Primary Physician Kelton Pillar, MD Primary Cardiologist: Lorretta Harp MD Renae Gloss  HPI:  Ms. Schneiter is a 71 year old thin-appearing divorced African-American female mother of 2, her mother for grandchildren referred by Dr. Stanford Breed for evaluation of symptomatic PAD. Her primary care provider is Dr. Coralyn Mark. She has a history of hypertension and hyperlipidemia. She smokes requires a pack a day up until 2013 (30-40 pack years). She does have a history of CAD and mild to moderate LV dysfunction. She has a small to moderate size infrarenal abdominal aortic aneurysm demonstrated by Dopplers October 2017 measuring 2.4 x 3.9 cm. She can walk only 20-30 feet and had recent Dopplers performed 12/11/15 revealing ABIs in the 0.6 range bilaterally with iliac, common femoral and superficial femoral artery disease. She wishes to initially pursue a pharmacologic approach and if unsuccessful a more aggressive approach.   Current Outpatient Prescriptions  Medication Sig Dispense Refill  . acetaminophen (TYLENOL) 325 MG tablet Take 650 mg by mouth every 6 (six) hours as needed for mild pain or moderate pain. For pain    . amLODipine (NORVASC) 5 MG tablet Take 1 tablet (5 mg total) by mouth daily. 90 tablet 3  . aspirin EC 81 MG tablet Take 1 tablet (81 mg total) by mouth daily. 90 tablet 3  . carvedilol (COREG) 12.5 MG tablet Take 1 tablet (12.5 mg total) by mouth 2 (two) times daily with a meal. 180 tablet 3  . clotrimazole (LOTRIMIN) 1 % cream Apply 1 application topically 4 (four) times daily as needed. 15 g 0  . lisinopril (PRINIVIL,ZESTRIL) 20 MG tablet Take 1 tablet (20 mg total) by mouth daily. 90 tablet 3  . nitroGLYCERIN (NITROSTAT) 0.4 MG SL tablet Place 0.4 mg under the tongue every 5 (five) minutes as needed for chest pain (X3 DOSES BEFORE CALLING 911).    . pravastatin (PRAVACHOL) 20 MG tablet TAKE 1 TABLET (20 MG  TOTAL) BY MOUTH AT BEDTIME. 90 tablet 1  . predniSONE (DELTASONE) 10 MG tablet Take 10 mg by mouth as needed (flare up).     Marland Kitchen PROVENTIL HFA 108 (90 Base) MCG/ACT inhaler INHALE 2 PUFFS INTO THE LUNGS EVERY 6 (SIX) HOURS AS NEEDED FOR WHEEZING. 6.7 Inhaler 3  . Tiotropium Bromide-Olodaterol (STIOLTO RESPIMAT) 2.5-2.5 MCG/ACT AERS Inhale 2 puffs into the lungs daily. 3 Inhaler 3  . UNABLE TO FIND Inhale into the lungs daily as needed (shortness of breath). (Oxygen) 2-3 liters     No current facility-administered medications for this visit.     Allergies  Allergen Reactions  . Aspirin Swelling    nausea and dizziness after taking for one month at a time.. States her physician told her to use the 81 mg vs the 325mg .  . Pneumovax [Pneumococcal Polysaccharide Vaccine] Hives and Swelling  . Shellfish Allergy Swelling    Medication withdrawal symptoms  . Tdap [Diphth-Acell Pertussis-Tetanus] Swelling and Rash    Social History   Social History  . Marital status: Married    Spouse name: N/A  . Number of children: 2  . Years of education: N/A   Occupational History  . unemployed     Retired   Social History Main Topics  . Smoking status: Former Smoker    Packs/day: 0.50    Years: 40.00    Types: Cigarettes    Quit date: 02/14/2011  . Smokeless tobacco: Never Used  . Alcohol use 0.0 oz/week  Comment: socially once or twice per year  . Drug use: No     Comment: denies uses 10/10/14  . Sexual activity: Yes    Birth control/ protection: Surgical   Other Topics Concern  . Not on file   Social History Narrative  . No narrative on file     Review of Systems: General: negative for chills, fever, night sweats or weight changes.  Cardiovascular: negative for chest pain, dyspnea on exertion, edema, orthopnea, palpitations, paroxysmal nocturnal dyspnea or shortness of breath Dermatological: negative for rash Respiratory: negative for cough or wheezing Urologic: negative for  hematuria Abdominal: negative for nausea, vomiting, diarrhea, bright red blood per rectum, melena, or hematemesis Neurologic: negative for visual changes, syncope, or dizziness All other systems reviewed and are otherwise negative except as noted above.    Blood pressure 106/61, pulse 71, height 5\' 4"  (1.626 m), weight 80 lb 12.8 oz (36.7 kg).  General appearance: alert and no distress Neck: no adenopathy, no carotid bruit, no JVD, supple, symmetrical, trachea midline and thyroid not enlarged, symmetric, no tenderness/mass/nodules Lungs: clear to auscultation bilaterally Heart: regular rate and rhythm, S1, S2 normal, no murmur, click, rub or gallop Extremities: extremities normal, atraumatic, no cyanosis or edema  EKG not performed today  ASSESSMENT AND PLAN:   AAA (abdominal aortic aneurysm) without rupture (HCC) History of small abdominal aortic aneurysm measuring 2.4 x 3.9 cm. We will continue to follow this noninvasively on a annual basis  Peripheral arterial disease [facility]) Ms. Rasmusson was referred by Dr. Stanford Breed for evaluation of symptomatic PAD. Recent Dopplers performed 12/11/15 revealed ABIs in the 0.6 range bilaterally with high-grade right external iliac artery stenosis and occluded right SFA. She had high-grade left external iliac common femoral stenosis. She was wishes to pursue pharmacologic therapy initially and if unsuccessful consider a more invasive approach. I'm going to begin her on Pletal 50 mg by mouth twice a day and we'll see her back in 2 months to further evaluate      Lorretta Harp MD [facility], [facility] 01/31/2016 10:30 AM

## 2016-01-31 NOTE — Assessment & Plan Note (Signed)
Ms. Heidinger was referred by Dr. Stanford Breed for evaluation of symptomatic PAD. Recent Dopplers performed 12/11/15 revealed ABIs in the 0.6 range bilaterally with high-grade right external iliac artery stenosis and occluded right SFA. She had high-grade left external iliac common femoral stenosis. She was wishes to pursue pharmacologic therapy initially and if unsuccessful consider a more invasive approach. I'm going to begin her on Pletal 50 mg by mouth twice a day and we'll see her back in 2 months to further evaluate

## 2016-01-31 NOTE — Assessment & Plan Note (Signed)
History of small abdominal aortic aneurysm measuring 2.4 x 3.9 cm. We will continue to follow this noninvasively on a annual basis

## 2016-02-07 DIAGNOSIS — H2511 Age-related nuclear cataract, right eye: Secondary | ICD-10-CM | POA: Diagnosis not present

## 2016-02-07 DIAGNOSIS — H25811 Combined forms of age-related cataract, right eye: Secondary | ICD-10-CM | POA: Diagnosis not present

## 2016-02-13 DIAGNOSIS — Z01411 Encounter for gynecological examination (general) (routine) with abnormal findings: Secondary | ICD-10-CM | POA: Diagnosis not present

## 2016-02-13 DIAGNOSIS — N952 Postmenopausal atrophic vaginitis: Secondary | ICD-10-CM | POA: Diagnosis not present

## 2016-02-13 DIAGNOSIS — N941 Unspecified dyspareunia: Secondary | ICD-10-CM | POA: Diagnosis not present

## 2016-02-13 DIAGNOSIS — F5101 Primary insomnia: Secondary | ICD-10-CM | POA: Diagnosis not present

## 2016-02-22 ENCOUNTER — Ambulatory Visit: Payer: Medicare Other | Admitting: Adult Health

## 2016-02-22 ENCOUNTER — Encounter: Payer: Self-pay | Admitting: Internal Medicine

## 2016-02-22 ENCOUNTER — Encounter (HOSPITAL_BASED_OUTPATIENT_CLINIC_OR_DEPARTMENT_OTHER): Payer: Self-pay | Admitting: *Deleted

## 2016-02-22 ENCOUNTER — Emergency Department (HOSPITAL_BASED_OUTPATIENT_CLINIC_OR_DEPARTMENT_OTHER)
Admission: EM | Admit: 2016-02-22 | Discharge: 2016-02-22 | Disposition: A | Discharge status: Home / Self Care | Payer: Medicare Other | Attending: Emergency Medicine | Admitting: Emergency Medicine

## 2016-02-22 ENCOUNTER — Emergency Department (HOSPITAL_BASED_OUTPATIENT_CLINIC_OR_DEPARTMENT_OTHER): Payer: Medicare Other

## 2016-02-22 DIAGNOSIS — J9610 Chronic respiratory failure, unspecified whether with hypoxia or hypercapnia: Secondary | ICD-10-CM | POA: Diagnosis not present

## 2016-02-22 DIAGNOSIS — I2510 Atherosclerotic heart disease of native coronary artery without angina pectoris: Secondary | ICD-10-CM | POA: Insufficient documentation

## 2016-02-22 DIAGNOSIS — R05 Cough: Secondary | ICD-10-CM | POA: Diagnosis not present

## 2016-02-22 DIAGNOSIS — Z87891 Personal history of nicotine dependence: Secondary | ICD-10-CM | POA: Insufficient documentation

## 2016-02-22 DIAGNOSIS — I110 Hypertensive heart disease with heart failure: Secondary | ICD-10-CM | POA: Insufficient documentation

## 2016-02-22 DIAGNOSIS — R0602 Shortness of breath: Secondary | ICD-10-CM | POA: Diagnosis present

## 2016-02-22 DIAGNOSIS — I5022 Chronic systolic (congestive) heart failure: Secondary | ICD-10-CM | POA: Insufficient documentation

## 2016-02-22 DIAGNOSIS — J441 Chronic obstructive pulmonary disease with (acute) exacerbation: Secondary | ICD-10-CM

## 2016-02-22 DIAGNOSIS — Z7982 Long term (current) use of aspirin: Secondary | ICD-10-CM | POA: Diagnosis not present

## 2016-02-22 MED ORDER — ALBUTEROL SULFATE HFA 108 (90 BASE) MCG/ACT IN AERS: 1.0000 | INHALATION_SPRAY | Freq: Four times a day (QID) | RESPIRATORY_TRACT | 1 refills | Status: DC | PRN

## 2016-02-22 MED ORDER — IPRATROPIUM-ALBUTEROL 0.5-2.5 (3) MG/3ML IN SOLN
3.0000 mL | Freq: Four times a day (QID) | RESPIRATORY_TRACT | Status: DC
  Administered 2016-02-22: 3 mL via RESPIRATORY_TRACT
  Filled 2016-02-22: qty 3

## 2016-02-22 MED ORDER — PREDNISONE 10 MG PO TABS: 40.0000 mg | ORAL_TABLET | Freq: Every day | ORAL | 0 refills | Status: DC

## 2016-02-22 MED ORDER — PREDNISONE 20 MG PO TABS
40.0000 mg | ORAL_TABLET | Freq: Once | ORAL | Status: AC
  Administered 2016-02-22: 40 mg via ORAL
  Filled 2016-02-22: qty 2

## 2016-02-22 NOTE — ED Notes (Signed)
Patient given meds as ordered. When in the room to give meds, patient talking on phone and in no distress

## 2016-02-22 NOTE — Discharge Instructions (Signed)
Take the prednisone as directed. Use albuterol inhaler 2 puffs every 6 hours. Return for any new or worse symptoms.

## 2016-02-22 NOTE — ED Triage Notes (Signed)
SOB x 2 days. She drove herself here. Cough.
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
## 2016-02-22 NOTE — ED Notes (Signed)
Pt states she is feeling better than when she came in

## 2016-02-26 ENCOUNTER — Encounter (INDEPENDENT_AMBULATORY_CARE_PROVIDER_SITE_OTHER): Payer: Self-pay

## 2016-02-26 ENCOUNTER — Other Ambulatory Visit: Payer: Self-pay

## 2016-02-26 ENCOUNTER — Ambulatory Visit (HOSPITAL_COMMUNITY): Payer: Medicare Other | Attending: Cardiology

## 2016-02-26 DIAGNOSIS — I519 Heart disease, unspecified: Secondary | ICD-10-CM | POA: Diagnosis not present

## 2016-02-26 DIAGNOSIS — Z87891 Personal history of nicotine dependence: Secondary | ICD-10-CM | POA: Diagnosis not present

## 2016-02-26 DIAGNOSIS — I2510 Atherosclerotic heart disease of native coronary artery without angina pectoris: Secondary | ICD-10-CM | POA: Diagnosis not present

## 2016-02-26 DIAGNOSIS — J449 Chronic obstructive pulmonary disease, unspecified: Secondary | ICD-10-CM | POA: Insufficient documentation

## 2016-02-26 DIAGNOSIS — E0590 Thyrotoxicosis, unspecified without thyrotoxic crisis or storm: Secondary | ICD-10-CM | POA: Diagnosis not present

## 2016-02-26 DIAGNOSIS — I083 Combined rheumatic disorders of mitral, aortic and tricuspid valves: Secondary | ICD-10-CM | POA: Diagnosis not present

## 2016-02-28 ENCOUNTER — Inpatient Hospital Stay (HOSPITAL_BASED_OUTPATIENT_CLINIC_OR_DEPARTMENT_OTHER)
Admission: EM | Admit: 2016-02-28 | Discharge: 2016-02-28 | DRG: 392 | Disposition: A | Discharge status: Home / Self Care | Payer: Medicare Other | Attending: Emergency Medicine | Admitting: Emergency Medicine

## 2016-02-28 ENCOUNTER — Encounter (HOSPITAL_BASED_OUTPATIENT_CLINIC_OR_DEPARTMENT_OTHER): Payer: Self-pay

## 2016-02-28 DIAGNOSIS — K222 Esophageal obstruction: Secondary | ICD-10-CM

## 2016-02-28 DIAGNOSIS — Z7982 Long term (current) use of aspirin: Secondary | ICD-10-CM | POA: Diagnosis not present

## 2016-02-28 DIAGNOSIS — Z886 Allergy status to analgesic agent status: Secondary | ICD-10-CM | POA: Diagnosis not present

## 2016-02-28 DIAGNOSIS — R0602 Shortness of breath: Secondary | ICD-10-CM | POA: Diagnosis not present

## 2016-02-28 DIAGNOSIS — Z7952 Long term (current) use of systemic steroids: Secondary | ICD-10-CM

## 2016-02-28 DIAGNOSIS — Z90710 Acquired absence of both cervix and uterus: Secondary | ICD-10-CM

## 2016-02-28 DIAGNOSIS — N19 Unspecified kidney failure: Secondary | ICD-10-CM | POA: Diagnosis present

## 2016-02-28 DIAGNOSIS — H9190 Unspecified hearing loss, unspecified ear: Secondary | ICD-10-CM | POA: Diagnosis present

## 2016-02-28 DIAGNOSIS — Z87891 Personal history of nicotine dependence: Secondary | ICD-10-CM | POA: Diagnosis not present

## 2016-02-28 DIAGNOSIS — E785 Hyperlipidemia, unspecified: Secondary | ICD-10-CM | POA: Diagnosis present

## 2016-02-28 DIAGNOSIS — J449 Chronic obstructive pulmonary disease, unspecified: Secondary | ICD-10-CM | POA: Diagnosis present

## 2016-02-28 DIAGNOSIS — I252 Old myocardial infarction: Secondary | ICD-10-CM

## 2016-02-28 DIAGNOSIS — I255 Ischemic cardiomyopathy: Secondary | ICD-10-CM | POA: Diagnosis present

## 2016-02-28 DIAGNOSIS — Z91013 Allergy to seafood: Secondary | ICD-10-CM

## 2016-02-28 DIAGNOSIS — Z79899 Other long term (current) drug therapy: Secondary | ICD-10-CM

## 2016-02-28 DIAGNOSIS — I5022 Chronic systolic (congestive) heart failure: Secondary | ICD-10-CM | POA: Diagnosis present

## 2016-02-28 DIAGNOSIS — Z8249 Family history of ischemic heart disease and other diseases of the circulatory system: Secondary | ICD-10-CM

## 2016-02-28 DIAGNOSIS — I110 Hypertensive heart disease with heart failure: Secondary | ICD-10-CM | POA: Diagnosis present

## 2016-02-28 DIAGNOSIS — Z887 Allergy status to serum and vaccine status: Secondary | ICD-10-CM | POA: Diagnosis not present

## 2016-02-28 DIAGNOSIS — Z86010 Personal history of colonic polyps: Secondary | ICD-10-CM | POA: Diagnosis not present

## 2016-02-28 LAB — CBC WITH DIFFERENTIAL/PLATELET
Basophils Absolute: 0 10*3/uL (ref 0.0–0.1)
Basophils Relative: 0 %
Eosinophils Absolute: 0 10*3/uL (ref 0.0–0.7)
Eosinophils Relative: 0 %
HCT: 34.4 % — ABNORMAL LOW (ref 36.0–46.0)
Hemoglobin: 11.2 g/dL — ABNORMAL LOW (ref 12.0–15.0)
Lymphocytes Relative: 16 %
Lymphs Abs: 1.5 10*3/uL (ref 0.7–4.0)
MCH: 28.4 pg (ref 26.0–34.0)
MCHC: 32.6 g/dL (ref 30.0–36.0)
MCV: 87.1 fL (ref 78.0–100.0)
Monocytes Absolute: 0.8 10*3/uL (ref 0.1–1.0)
Monocytes Relative: 8 %
Neutro Abs: 7.1 10*3/uL (ref 1.7–7.7)
Neutrophils Relative %: 76 %
Platelets: 302 10*3/uL (ref 150–400)
RBC: 3.95 MIL/uL (ref 3.87–5.11)
RDW: 14.1 % (ref 11.5–15.5)
WBC: 9.4 10*3/uL (ref 4.0–10.5)

## 2016-02-28 LAB — BASIC METABOLIC PANEL
Anion gap: 7 (ref 5–15)
BUN: 18 mg/dL (ref 6–20)
CO2: 33 mmol/L — ABNORMAL HIGH (ref 22–32)
Calcium: 9 mg/dL (ref 8.9–10.3)
Chloride: 96 mmol/L — ABNORMAL LOW (ref 101–111)
Creatinine, Ser: 0.99 mg/dL (ref 0.44–1.00)
GFR calc Af Amer: >60 mL/min (ref >60)
GFR calc non Af Amer: 57 mL/min — ABNORMAL LOW (ref >60)
Glucose, Bld: 97 mg/dL (ref 65–99)
Potassium: 3 mmol/L — ABNORMAL LOW (ref 3.5–5.1)
Sodium: 136 mmol/L (ref 135–145)

## 2016-02-28 NOTE — Discharge Instructions (Signed)
Follow-up with your gastroenterologist.  
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
## 2016-02-28 NOTE — ED Notes (Signed)
Pt states she is feeling much better and felt the food bolus go down.  Dr. Alvino Chapel notified.

## 2016-02-28 NOTE — ED Triage Notes (Addendum)
C/o feeling SOB after getting choked on beef today-prod "bloody cough" per pt-was seen last week for prod cough-dyspnea and nasal flaring noted-steady gait

## 2016-02-28 NOTE — ED Notes (Signed)
Pt states it feels like food is stuck in her esophagus.Pt has hx impacted food bolus. Pt last had esophagus stretched in December. Pt currently spitting up clear saliva.

## 2016-02-29 ENCOUNTER — Other Ambulatory Visit: Payer: Self-pay | Admitting: Cardiovascular Disease

## 2016-02-29 ENCOUNTER — Ambulatory Visit (INDEPENDENT_AMBULATORY_CARE_PROVIDER_SITE_OTHER): Payer: Medicare Other | Admitting: Adult Health

## 2016-02-29 ENCOUNTER — Ambulatory Visit (HOSPITAL_BASED_OUTPATIENT_CLINIC_OR_DEPARTMENT_OTHER)
Admission: RE | Admit: 2016-02-29 | Discharge: 2016-02-29 | Disposition: A | Discharge status: Home / Self Care | Payer: Medicare Other | Source: Ambulatory Visit | Attending: Adult Health | Admitting: Adult Health

## 2016-02-29 ENCOUNTER — Telehealth: Payer: Self-pay | Admitting: Adult Health

## 2016-02-29 ENCOUNTER — Encounter: Payer: Self-pay | Admitting: Adult Health

## 2016-02-29 ENCOUNTER — Telehealth: Payer: Self-pay | Admitting: Cardiovascular Disease

## 2016-02-29 VITALS — BP 147/82 | HR 105 | Temp 102.4°F | Ht 64.0 in

## 2016-02-29 DIAGNOSIS — I059 Rheumatic mitral valve disease, unspecified: Secondary | ICD-10-CM

## 2016-02-29 DIAGNOSIS — J449 Chronic obstructive pulmonary disease, unspecified: Secondary | ICD-10-CM

## 2016-02-29 DIAGNOSIS — R05 Cough: Secondary | ICD-10-CM | POA: Diagnosis not present

## 2016-02-29 DIAGNOSIS — R509 Fever, unspecified: Secondary | ICD-10-CM | POA: Diagnosis not present

## 2016-02-29 DIAGNOSIS — R0602 Shortness of breath: Secondary | ICD-10-CM | POA: Diagnosis not present

## 2016-02-29 DIAGNOSIS — I5022 Chronic systolic (congestive) heart failure: Secondary | ICD-10-CM

## 2016-02-29 MED ORDER — LEVOFLOXACIN 500 MG PO TABS: 500.0000 mg | ORAL_TABLET | Freq: Every day | ORAL | 0 refills | Status: DC

## 2016-02-29 MED ORDER — LEVOFLOXACIN 500 MG PO TABS: 500.0000 mg | ORAL_TABLET | Freq: Every day | ORAL | 0 refills | Status: AC

## 2016-02-29 MED ORDER — PREDNISONE 10 MG PO TABS: ORAL_TABLET | ORAL | 0 refills | Status: DC

## 2016-02-29 MED ORDER — TIOTROPIUM BROMIDE-OLODATEROL 2.5-2.5 MCG/ACT IN AERS: 2.0000 | INHALATION_SPRAY | Freq: Every day | RESPIRATORY_TRACT | 0 refills | Status: DC

## 2016-02-29 MED ORDER — ALBUTEROL SULFATE (2.5 MG/3ML) 0.083% IN NEBU: 2.5000 mg | INHALATION_SOLUTION | Freq: Four times a day (QID) | RESPIRATORY_TRACT | 2 refills | Status: DC | PRN

## 2016-02-29 MED ORDER — DOXYCYCLINE HYCLATE 100 MG PO TABS: 100.0000 mg | ORAL_TABLET | Freq: Two times a day (BID) | ORAL | 0 refills | Status: DC

## 2016-02-29 MED ORDER — LEVALBUTEROL HCL 0.63 MG/3ML IN NEBU
0.6300 mg | INHALATION_SOLUTION | Freq: Once | RESPIRATORY_TRACT | Status: AC
  Administered 2016-02-29: 0.63 mg via RESPIRATORY_TRACT

## 2016-02-29 NOTE — Patient Instructions (Addendum)
Levaquin 500mg  daily for 7 days  Prednisone taper over next week  Mucinex DM Twice daily  As needed  Cough/congestion  Continue on Stiolto , this was sent to pharmacy.  Continue on O2 with activity .  Fluids and rest  Tylenol As needed  Fever.  Follow up with GI in [facility] as planned Please contact office for sooner follow up if symptoms do not improve or worsen or seek emergency care  Follow up Dr. Elsworth Soho  In 6-8 weeks and As needed   Discuss with your Primary MD that Lisinopril may be causing your cough to worse. Also your potassium was low, will need to follow up for follow up labs. Eat more bananas.

## 2016-02-29 NOTE — Telephone Encounter (Signed)
Pt wanted to make TP aware that her RX were finally received by pharmacy after multiple attempts. Nothing further needed.  Will route to TP as a FYI.

## 2016-02-29 NOTE — Telephone Encounter (Signed)
-----   Message from Lorretta Harp, MD sent at 02/27/2016  7:41 AM EST ----- Normal LV systolic function with mild mitral stenosis, mild to moderate mitral regurgitation and mild to moderate pulmonary hypertension. Repeat study in 12 months

## 2016-02-29 NOTE — Assessment & Plan Note (Addendum)
Does not appear in fluid overal  cxr ok  Cont on current regimen  follow up with PCP for labs

## 2016-02-29 NOTE — Assessment & Plan Note (Signed)
Slow to resolve flare , now with fever.  CXR w/ no sign of PNA . Flu swab neg  Labs from yesterday reveiwed w/ nml wbc .  Unclear what is causiing high fever  Pt has very low reserve with her COPD and other comorbidities, recommended hosptial admission , pt declines.  Discussed with son as well . They assure me is she is not improving or worsens will go to ER .  ACE may be aggravating cough . Would change if possible will leave to PCP .  Plan  Patient Instructions  Levaquin 500mg  daily for 7 days  Prednisone taper over next week  Mucinex DM Twice daily  As needed  Cough/congestion  Continue on Stiolto , this was sent to pharmacy.  Continue on O2 with activity .  Fluids and rest  Tylenol As needed  Fever.  Follow up with GI in [facility] as planned Please contact office for sooner follow up if symptoms do not improve or worsen or seek emergency care  Follow up Dr. Elsworth Soho  In 6-8 weeks and As needed   Discuss with your Primary MD that Lisinopril may be causing your cough to worse. Also your potassium was low, will need to follow up for follow up labs. Eat more bananas.
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
## 2016-02-29 NOTE — Telephone Encounter (Signed)
Results given to pt. Pt verbalized understanding. Repeat order entered.  

## 2016-02-29 NOTE — Addendum Note (Signed)
Addended by: Parke Poisson E on: 02/29/2016 02:15 PM   Modules accepted: Orders

## 2016-03-01 NOTE — Progress Notes (Signed)
Reviewed & agree with plan  

## 2016-03-11 DIAGNOSIS — I10 Essential (primary) hypertension: Secondary | ICD-10-CM | POA: Diagnosis not present

## 2016-03-11 DIAGNOSIS — E876 Hypokalemia: Secondary | ICD-10-CM | POA: Diagnosis not present

## 2016-03-11 DIAGNOSIS — E559 Vitamin D deficiency, unspecified: Secondary | ICD-10-CM | POA: Diagnosis not present

## 2016-03-11 DIAGNOSIS — J438 Other emphysema: Secondary | ICD-10-CM | POA: Diagnosis not present

## 2016-03-11 DIAGNOSIS — E0590 Thyrotoxicosis, unspecified without thyrotoxic crisis or storm: Secondary | ICD-10-CM | POA: Diagnosis not present

## 2016-03-11 DIAGNOSIS — E46 Unspecified protein-calorie malnutrition: Secondary | ICD-10-CM | POA: Diagnosis not present

## 2016-03-11 DIAGNOSIS — E890 Postprocedural hypothyroidism: Secondary | ICD-10-CM | POA: Diagnosis not present

## 2016-03-11 DIAGNOSIS — K222 Esophageal obstruction: Secondary | ICD-10-CM | POA: Diagnosis not present

## 2016-03-11 DIAGNOSIS — R5383 Other fatigue: Secondary | ICD-10-CM | POA: Diagnosis not present

## 2016-03-18 DIAGNOSIS — R040 Epistaxis: Secondary | ICD-10-CM | POA: Diagnosis not present

## 2016-03-18 DIAGNOSIS — H9201 Otalgia, right ear: Secondary | ICD-10-CM | POA: Diagnosis not present

## 2016-03-18 DIAGNOSIS — H6121 Impacted cerumen, right ear: Secondary | ICD-10-CM | POA: Diagnosis not present

## 2016-03-21 ENCOUNTER — Telehealth: Payer: Self-pay | Admitting: Cardiovascular Disease

## 2016-03-21 NOTE — Telephone Encounter (Signed)
Received records from [facility] for appointment on 04/05/16 with Dr Gwenlyn Found.  Records put with Dr Kennon Holter schedule for 04/05/16. lp

## 2016-03-27 ENCOUNTER — Other Ambulatory Visit: Payer: Self-pay

## 2016-03-27 ENCOUNTER — Other Ambulatory Visit: Payer: Self-pay | Admitting: *Deleted

## 2016-03-27 MED ORDER — CILOSTAZOL 50 MG PO TABS: 50.0000 mg | ORAL_TABLET | Freq: Two times a day (BID) | ORAL | 1 refills | Status: DC

## 2016-03-27 MED ORDER — CILOSTAZOL 50 MG PO TABS: 50.0000 mg | ORAL_TABLET | Freq: Two times a day (BID) | ORAL | 3 refills | Status: DC

## 2016-03-27 NOTE — Telephone Encounter (Signed)
MEDICATION CILOSTAZOL 50 MG #180  E-SENT TO PHARMACY

## 2016-04-01 ENCOUNTER — Other Ambulatory Visit: Payer: Self-pay | Admitting: Adult Health

## 2016-04-05 ENCOUNTER — Ambulatory Visit (INDEPENDENT_AMBULATORY_CARE_PROVIDER_SITE_OTHER): Payer: Medicare Other | Admitting: Cardiovascular Disease

## 2016-04-05 ENCOUNTER — Encounter: Payer: Self-pay | Admitting: Cardiovascular Disease

## 2016-04-05 VITALS — BP 84/40 | HR 76 | Ht 64.0 in | Wt 83.4 lb

## 2016-04-05 DIAGNOSIS — I10 Essential (primary) hypertension: Secondary | ICD-10-CM

## 2016-04-05 DIAGNOSIS — I739 Peripheral vascular disease, unspecified: Secondary | ICD-10-CM | POA: Diagnosis not present

## 2016-04-05 DIAGNOSIS — E0590 Thyrotoxicosis, unspecified without thyrotoxic crisis or storm: Secondary | ICD-10-CM | POA: Diagnosis not present

## 2016-04-05 DIAGNOSIS — R0989 Other specified symptoms and signs involving the circulatory and respiratory systems: Secondary | ICD-10-CM

## 2016-04-05 NOTE — Assessment & Plan Note (Signed)
History of peripheral arterial disease with claudication. Dopplers performed 12/11/15 revealed ABIs in the 0.6 range bilaterally with occluded right SFA and high-grade bilateral external iliac disease. I did begin her on Pletal which significantly improved her claudication symptoms. At this point, she is still contemplating whether she wants to pursue an invasive strategy to perform endovascular therapy and improve her circulation. We will repeat lower extremity until Doppler studies in 3 months I will see her back after that for further evaluation.

## 2016-04-05 NOTE — Patient Instructions (Signed)
Medication Instructions: Your physician recommends that you continue on your current medications as directed. Please refer to the Current Medication list given to you today.  Testing: Your physician has requested that you have a carotid duplex. This test is an ultrasound of the carotid arteries in your neck. It looks at blood flow through these arteries that supply the brain with blood. Allow one hour for this exam. There are no restrictions or special instructions.  Your physician has requested that you have a lower extremity arterial duplex. During this test, ultrasound is used to evaluate arterial blood flow in the legs. Allow one hour for this exam. There are no restrictions or special instructions.  Your physician has requested that you have an ankle brachial index (ABI). During this test an ultrasound and blood pressure cuff are used to evaluate the arteries that supply the arms and legs with blood. Allow thirty minutes for this exam. There are no restrictions or special instructions.  (3 months--prior to f/u appt with Dr. Gwenlyn Found)   Follow-Up: Your physician recommends that you schedule a follow-up appointment in: 3 months with Dr. Gwenlyn Found.  If you need a refill on your cardiac medications before your next appointment, please call your pharmacy.

## 2016-04-05 NOTE — Progress Notes (Signed)
04/05/2016 Tonya Harper   1945/03/14  [id number]  Primary Physician Kelton Pillar, MD Primary Cardiologist: Lorretta Harp MD Renae Gloss  HPI:  Tonya Harper is a 71 year old thin-appearing divorced African-American female mother of 2, her mother for grandchildren referred by Tonya Harper for evaluation of symptomatic PAD. I last saw her in the office 01/31/16 Her primary care provider is Tonya Harper. She has a history of hypertension and hyperlipidemia. She smokes requires a pack a day up until 2013 (30-40 pack years). She does have a history of CAD and mild to moderate LV dysfunction. She has a small to moderate size infrarenal abdominal aortic aneurysm demonstrated by Dopplers October 2017 measuring 2.4 x 3.9 cm. She can walk only 20-30 feet and had recent Dopplers performed 12/11/15 revealing ABIs in the 0.6 range bilaterally with iliac, common femoral and superficial femoral artery disease. She wished to initially pursue a pharmacologic approach and if unsuccessful a more aggressive approach. I began her on Pletal which did afford her some benefit.   Current Outpatient Prescriptions  Medication Sig Dispense Refill  . acetaminophen (TYLENOL) 325 MG tablet Take 650 mg by mouth every 6 (six) hours as needed for mild pain or moderate pain. For pain    . albuterol (PROVENTIL HFA;VENTOLIN HFA) 108 (90 Base) MCG/ACT inhaler Inhale 1-2 puffs into the lungs every 6 (six) hours as needed for wheezing or shortness of breath. 1 Inhaler 1  . albuterol (PROVENTIL) (2.5 MG/3ML) 0.083% nebulizer solution Take 3 mLs (2.5 mg total) by nebulization every 6 (six) hours as needed for wheezing or shortness of breath. 360 mL 2  . amLODipine (NORVASC) 5 MG tablet Take 1 tablet (5 mg total) by mouth daily. 90 tablet 3  . aspirin EC 81 MG tablet Take 1 tablet (81 mg total) by mouth daily. 90 tablet 3  . carvedilol (COREG) 12.5 MG tablet Take 1 tablet (12.5 mg total) by mouth 2 (two) times daily  with a meal. 180 tablet 3  . cilostazol (PLETAL) 50 MG tablet Take 1 tablet (50 mg total) by mouth 2 (two) times daily. 180 tablet 3  . clotrimazole (LOTRIMIN) 1 % cream Apply 1 application topically 4 (four) times daily as needed. 15 g 0  . doxycycline (VIBRA-TABS) 100 MG tablet Take 1 tablet (100 mg total) by mouth 2 (two) times daily. 14 tablet 0  . levofloxacin (LEVAQUIN) 500 MG tablet Take 1 tablet (500 mg total) by mouth daily. 7 tablet 0  . levothyroxine (SYNTHROID, LEVOTHROID) 25 MCG tablet Take 1 tablet by mouth daily.    Marland Kitchen lisinopril (PRINIVIL,ZESTRIL) 20 MG tablet Take 1 tablet (20 mg total) by mouth daily. 90 tablet 3  . nitroGLYCERIN (NITROSTAT) 0.4 MG SL tablet Place 0.4 mg under the tongue every 5 (five) minutes as needed for chest pain (X3 DOSES BEFORE CALLING 911).    . pravastatin (PRAVACHOL) 20 MG tablet TAKE 1 TABLET (20 MG TOTAL) BY MOUTH AT BEDTIME. 90 tablet 1  . predniSONE (DELTASONE) 10 MG tablet Take 10 mg by mouth as needed (flare up).     Marland Kitchen PROVENTIL HFA 108 (90 Base) MCG/ACT inhaler INHALE 2 PUFFS INTO THE LUNGS EVERY 6 (SIX) HOURS AS NEEDED FOR WHEEZING. 6.7 Inhaler 3  . Tiotropium Bromide-Olodaterol (STIOLTO RESPIMAT) 2.5-2.5 MCG/ACT AERS Inhale 2 puffs into the lungs daily. 3 Inhaler 3  . Tiotropium Bromide-Olodaterol (STIOLTO RESPIMAT) 2.5-2.5 MCG/ACT AERS Inhale 2 puffs into the lungs daily. 2 Inhaler 0  . UNABLE TO  FIND Inhale into the lungs daily as needed (shortness of breath). (Oxygen) 2-3 liters     No current facility-administered medications for this visit.     Allergies  Allergen Reactions  . Aspirin Swelling    nausea and dizziness after taking for one month at a time.. States her physician told her to use the 81 mg vs the 325mg .  . Pneumovax [Pneumococcal Polysaccharide Vaccine] Hives and Swelling  . Shellfish Allergy Swelling    Medication withdrawal symptoms  . Tdap [Diphth-Acell Pertussis-Tetanus] Swelling and Rash    Social History    Social History  . Marital status: Married    Spouse name: N/A  . Number of children: 2  . Years of education: N/A   Occupational History  . unemployed     Retired   Social History Main Topics  . Smoking status: Former Smoker    Packs/day: 0.50    Years: 40.00    Types: Cigarettes    Quit date: 02/14/2011  . Smokeless tobacco: Never Used  . Alcohol use 0.0 oz/week     Comment: occ  . Drug use: No     Comment: denies uses 10/10/14  . Sexual activity: Yes    Birth control/ protection: Surgical   Other Topics Concern  . Not on file   Social History Narrative  . No narrative on file     Review of Systems: General: negative for chills, fever, night sweats or weight changes.  Cardiovascular: negative for chest pain, dyspnea on exertion, edema, orthopnea, palpitations, paroxysmal nocturnal dyspnea or shortness of breath Dermatological: negative for rash Respiratory: negative for cough or wheezing Urologic: negative for hematuria Abdominal: negative for nausea, vomiting, diarrhea, bright red blood per rectum, melena, or hematemesis Neurologic: negative for visual changes, syncope, or dizziness All other systems reviewed and are otherwise negative except as noted above.    Blood pressure (!) 84/40, pulse 76, height 5\' 4"  (1.626 m), weight 83 lb 6.4 oz (37.8 kg).  General appearance: alert and no distress Neck: no adenopathy, no JVD, supple, symmetrical, trachea midline, thyroid not enlarged, symmetric, no tenderness/mass/nodules and Soft bilateral carotid bruits Lungs: clear to auscultation bilaterally Heart: regular rate and rhythm, S1, S2 normal, no murmur, click, rub or gallop Extremities: extremities normal, atraumatic, no cyanosis or edema  EKG sinus rhythm at 76 with inferior Q waves. I personally reviewed this EKG.  ASSESSMENT AND PLAN:   Peripheral arterial disease ([place]) History of peripheral arterial disease with claudication. Dopplers performed 12/11/15  revealed ABIs in the 0.6 range bilaterally with occluded right SFA and high-grade bilateral external iliac disease. I did begin her on Pletal which significantly improved her claudication symptoms. At this point, she is still contemplating whether she wants to pursue an invasive strategy to perform endovascular therapy and improve her circulation. We will repeat lower extremity until Doppler studies in 3 months I will see her back after that for further evaluation.      Lorretta Harp MD FACP,FACC,FAHA, [facility] 04/05/2016 11:04 AM

## 2016-04-11 ENCOUNTER — Ambulatory Visit (INDEPENDENT_AMBULATORY_CARE_PROVIDER_SITE_OTHER): Payer: Medicare Other | Admitting: Adult Health

## 2016-04-11 ENCOUNTER — Encounter: Payer: Self-pay | Admitting: Adult Health

## 2016-04-11 DIAGNOSIS — K222 Esophageal obstruction: Secondary | ICD-10-CM

## 2016-04-11 DIAGNOSIS — J449 Chronic obstructive pulmonary disease, unspecified: Secondary | ICD-10-CM | POA: Diagnosis not present

## 2016-04-11 NOTE — Patient Instructions (Addendum)
Continue on Stiolto Respimat 2 puffs daily .  Follow up with Dr. Elsworth Soho  In 3-4 months and As needed   Please contact office for sooner follow up if symptoms do not improve or worsen or seek emergency care   Follow up GI as planned .  Discuss with Primary MD that Lisinopril may aggravate your cough .
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
## 2016-04-11 NOTE — Assessment & Plan Note (Signed)
Recent flare now resolved.  ACE may aggravate her cough , may need to look at this going forward.   Plan  Patient Instructions  Continue on Stiolto Respimat 2 puffs daily .  Follow up with Dr. Elsworth Soho  In 3-4 months and As needed   Please contact office for sooner follow up if symptoms do not improve or worsen or seek emergency care   Follow up GI as planned .  Discuss with Primary MD that Lisinopril may aggravate your cough .

## 2016-04-11 NOTE — Assessment & Plan Note (Signed)
Cont follow up with GI .

## 2016-04-13 NOTE — Progress Notes (Signed)
Reviewed & agree with plan  

## 2016-04-17 ENCOUNTER — Ambulatory Visit (HOSPITAL_COMMUNITY): Admission: RE | Admit: 2016-04-17 | Payer: Medicare Other | Source: Ambulatory Visit

## 2016-04-30 DIAGNOSIS — R05 Cough: Secondary | ICD-10-CM | POA: Diagnosis not present

## 2016-04-30 DIAGNOSIS — E46 Unspecified protein-calorie malnutrition: Secondary | ICD-10-CM | POA: Diagnosis not present

## 2016-04-30 DIAGNOSIS — K222 Esophageal obstruction: Secondary | ICD-10-CM | POA: Diagnosis not present

## 2016-04-30 DIAGNOSIS — J438 Other emphysema: Secondary | ICD-10-CM | POA: Diagnosis not present

## 2016-04-30 DIAGNOSIS — R42 Dizziness and giddiness: Secondary | ICD-10-CM | POA: Diagnosis not present

## 2016-05-02 ENCOUNTER — Ambulatory Visit (HOSPITAL_COMMUNITY)
Admission: RE | Admit: 2016-05-02 | Discharge: 2016-05-02 | Disposition: A | Discharge status: Home / Self Care | Payer: Medicare Other | Source: Ambulatory Visit | Attending: Cardiology | Admitting: Cardiology

## 2016-05-02 DIAGNOSIS — I739 Peripheral vascular disease, unspecified: Secondary | ICD-10-CM | POA: Diagnosis not present

## 2016-05-02 DIAGNOSIS — I10 Essential (primary) hypertension: Secondary | ICD-10-CM | POA: Insufficient documentation

## 2016-05-02 DIAGNOSIS — Z87891 Personal history of nicotine dependence: Secondary | ICD-10-CM | POA: Diagnosis not present

## 2016-05-02 DIAGNOSIS — I2510 Atherosclerotic heart disease of native coronary artery without angina pectoris: Secondary | ICD-10-CM | POA: Insufficient documentation

## 2016-05-02 DIAGNOSIS — I6523 Occlusion and stenosis of bilateral carotid arteries: Secondary | ICD-10-CM | POA: Diagnosis not present

## 2016-05-02 DIAGNOSIS — E785 Hyperlipidemia, unspecified: Secondary | ICD-10-CM | POA: Diagnosis not present

## 2016-05-02 DIAGNOSIS — R0989 Other specified symptoms and signs involving the circulatory and respiratory systems: Secondary | ICD-10-CM

## 2016-05-02 DIAGNOSIS — J449 Chronic obstructive pulmonary disease, unspecified: Secondary | ICD-10-CM | POA: Insufficient documentation

## 2016-05-08 ENCOUNTER — Other Ambulatory Visit: Payer: Self-pay | Admitting: Cardiovascular Disease

## 2016-05-08 DIAGNOSIS — I739 Peripheral vascular disease, unspecified: Secondary | ICD-10-CM

## 2016-05-21 DIAGNOSIS — R21 Rash and other nonspecific skin eruption: Secondary | ICD-10-CM | POA: Diagnosis not present

## 2016-05-21 DIAGNOSIS — M1990 Unspecified osteoarthritis, unspecified site: Secondary | ICD-10-CM | POA: Diagnosis not present

## 2016-05-21 DIAGNOSIS — M2550 Pain in unspecified joint: Secondary | ICD-10-CM | POA: Diagnosis not present

## 2016-05-24 ENCOUNTER — Other Ambulatory Visit: Payer: Self-pay | Admitting: Cardiology

## 2016-05-27 ENCOUNTER — Telehealth: Payer: Self-pay | Admitting: Adult Health

## 2016-05-27 MED ORDER — ALBUTEROL SULFATE HFA 108 (90 BASE) MCG/ACT IN AERS: INHALATION_SPRAY | RESPIRATORY_TRACT | 5 refills | Status: DC

## 2016-05-27 NOTE — Telephone Encounter (Signed)
Pt requesting refill on albuterol inhaler.  This has been sent to preferred pharmacy.  Nothing further needed.  

## 2016-06-05 DIAGNOSIS — L659 Nonscarring hair loss, unspecified: Secondary | ICD-10-CM | POA: Diagnosis not present

## 2016-06-05 DIAGNOSIS — E46 Unspecified protein-calorie malnutrition: Secondary | ICD-10-CM | POA: Diagnosis not present

## 2016-06-05 DIAGNOSIS — J438 Other emphysema: Secondary | ICD-10-CM | POA: Diagnosis not present

## 2016-06-05 DIAGNOSIS — I10 Essential (primary) hypertension: Secondary | ICD-10-CM | POA: Diagnosis not present

## 2016-06-05 DIAGNOSIS — K222 Esophageal obstruction: Secondary | ICD-10-CM | POA: Diagnosis not present

## 2016-06-05 DIAGNOSIS — D6489 Other specified anemias: Secondary | ICD-10-CM | POA: Diagnosis not present

## 2016-07-04 DIAGNOSIS — E0590 Thyrotoxicosis, unspecified without thyrotoxic crisis or storm: Secondary | ICD-10-CM | POA: Diagnosis not present

## 2016-07-09 ENCOUNTER — Ambulatory Visit (INDEPENDENT_AMBULATORY_CARE_PROVIDER_SITE_OTHER): Payer: Medicare Other | Admitting: Cardiovascular Disease

## 2016-07-09 ENCOUNTER — Encounter: Payer: Self-pay | Admitting: Cardiovascular Disease

## 2016-07-09 VITALS — BP 108/56 | HR 72 | Ht 64.0 in | Wt 85.0 lb

## 2016-07-09 DIAGNOSIS — I739 Peripheral vascular disease, unspecified: Secondary | ICD-10-CM | POA: Diagnosis not present

## 2016-07-09 NOTE — Patient Instructions (Signed)
Medication Instructions: Your physician recommends that you continue on your current medications as directed. Please refer to the Current Medication list given to you today.   Testing/Procedures: Your physician has requested that you have a lower extremity arterial duplex. During this test, ultrasound is used to evaluate arterial blood flow in the legs. Allow one hour for this exam. There are no restrictions or special instructions.  Your physician has requested that you have an ankle brachial index (ABI). During this test an ultrasound and blood pressure cuff are used to evaluate the arteries that supply the arms and legs with blood. Allow thirty minutes for this exam. There are no restrictions or special instructions.  [facility] FOR November)   Follow-Up: Your physician recommends that you schedule a follow-up appointment in: December with Dr. Gwenlyn Found (6 months). You will receive a reminder letter in the mail two months in advance. If you don't receive a letter, please call our office to schedule the follow-up appointment.  If you need a refill on your cardiac medications before your next appointment, please call your pharmacy.

## 2016-07-09 NOTE — Assessment & Plan Note (Signed)
Tonya Harper returns today for follow-up of her peripheral arterial disease. I last saw her 04/05/16. She did have lifestyle limiting claudication with Dopplers that showed aortoiliac disease and a total right SFA with a right ABI 0.63 left of 0.61. Active begin her on Pletal which resulted in significant improvement in her walking ability. At this time she wishes to continue conservative/medical therapy. We will recheck lower extremity arterial Doppler studies in November and I will see her back in December for follow-up. She would be a candidate for endovascular therapy of her aortoiliac disease as a first step should she decide to pursue an invasive approach.

## 2016-07-09 NOTE — Progress Notes (Signed)
07/09/2016 [name]   04-20-1945  [id number]  Primary Physician Coralyn Mark, Altamese Cabal, MD Primary Cardiologist: Lorretta Harp MD Renae Gloss  HPI:  Ms. Ladley is a 71 year old thin-appearing divorced African-American female mother of 2, her mother for grandchildren referred by Dr. Stanford Breed for evaluation of symptomatic PAD. I last saw her in the office 04/05/16  Her primary care provider is Dr. Coralyn Mark. She has a history of hypertension and hyperlipidemia. She smokes requires a pack a day up until 2013 (30-40 pack years). She does have a history of CAD and mild to moderate LV dysfunction. She has a small to moderate size infrarenal abdominal aortic aneurysm demonstrated by Dopplers October 2017 measuring 2.4 x 3.9 cm. She can walk only 20-30 feet and had recent Dopplers performed 12/11/15 revealing ABIs in the 0.6 range bilaterally with iliac, common femoral and superficial femoral artery disease. She wished to initially pursue a pharmacologic approach and if unsuccessful a more aggressive approach. I began her on Pletal which did afford her some benefit. Since I saw her 3 months ago she continues to enjoy the benefits of pharmacologic therapy and wishes to put off an invasive approach at this time.   Current Outpatient Prescriptions  Medication Sig Dispense Refill  . acetaminophen (TYLENOL) 325 MG tablet Take 650 mg by mouth every 6 (six) hours as needed for mild pain or moderate pain. For pain    . albuterol (PROVENTIL HFA) 108 (90 Base) MCG/ACT inhaler INHALE 2 PUFFS INTO THE LUNGS EVERY 6 (SIX) HOURS AS NEEDED FOR WHEEZING. 1 Inhaler 5  . albuterol (PROVENTIL HFA;VENTOLIN HFA) 108 (90 Base) MCG/ACT inhaler Inhale 1-2 puffs into the lungs every 6 (six) hours as needed for wheezing or shortness of breath. 1 Inhaler 1  . albuterol (PROVENTIL) (2.5 MG/3ML) 0.083% nebulizer solution Take 3 mLs (2.5 mg total) by nebulization every 6 (six) hours as needed for wheezing or  shortness of breath. 360 mL 2  . amLODipine (NORVASC) 5 MG tablet Take 1 tablet (5 mg total) by mouth daily. (Patient taking differently: Take 2.5 mg by mouth daily. ) 90 tablet 3  . aspirin EC 81 MG tablet Take 1 tablet (81 mg total) by mouth daily. 90 tablet 3  . carvedilol (COREG) 12.5 MG tablet Take 1 tablet (12.5 mg total) by mouth 2 (two) times daily with a meal. 180 tablet 3  . carvedilol (COREG) 25 MG tablet TAKE 1 TABLET BY MOUTH TWICE A DAY 180 tablet 1  . cilostazol (PLETAL) 50 MG tablet Take 1 tablet (50 mg total) by mouth 2 (two) times daily. 180 tablet 3  . clotrimazole (LOTRIMIN) 1 % cream Apply 1 application topically 4 (four) times daily as needed. 15 g 0  . levothyroxine (SYNTHROID, LEVOTHROID) 25 MCG tablet Take 1 tablet by mouth daily.    . nitroGLYCERIN (NITROSTAT) 0.4 MG SL tablet Place 0.4 mg under the tongue every 5 (five) minutes as needed for chest pain (X3 DOSES BEFORE CALLING 911).    . nitroGLYCERIN (NITROSTAT) 0.4 MG SL tablet PLACE 1 TABLET UNDER THE TONGUE EVERY 5 MINS AS NEEDED FOR CHEST PAIN 25 tablet 0  . pravastatin (PRAVACHOL) 20 MG tablet TAKE 1 TABLET (20 MG TOTAL) BY MOUTH AT BEDTIME. 90 tablet 1  . predniSONE (DELTASONE) 10 MG tablet Take 10 mg by mouth as needed (flare up).     . Tiotropium Bromide-Olodaterol (STIOLTO RESPIMAT) 2.5-2.5 MCG/ACT AERS Inhale 2 puffs into the lungs daily. 3 Inhaler 3  .  Tiotropium Bromide-Olodaterol (STIOLTO RESPIMAT) 2.5-2.5 MCG/ACT AERS Inhale 2 puffs into the lungs daily. 2 Inhaler 0  . UNABLE TO FIND Inhale into the lungs daily as needed (shortness of breath). (Oxygen) 2-3 liters     No current facility-administered medications for this visit.     Allergies  Allergen Reactions  . Aspirin Swelling    nausea and dizziness after taking for one month at a time.. States her physician told her to use the 81 mg vs the 325mg .  . Pneumovax [Pneumococcal Polysaccharide Vaccine] Hives and Swelling  . Shellfish Allergy Swelling      Medication withdrawal symptoms  . Tdap [Diphth-Acell Pertussis-Tetanus] Swelling and Rash    Social History   Social History  . Marital status: Married    Spouse name: N/A  . Number of children: 2  . Years of education: N/A   Occupational History  . unemployed     Retired   Social History Main Topics  . Smoking status: Former Smoker    Packs/day: 0.50    Years: 40.00    Types: Cigarettes    Quit date: 02/14/2011  . Smokeless tobacco: Never Used  . Alcohol use 0.0 oz/week     Comment: occ  . Drug use: No     Comment: denies uses 10/10/14  . Sexual activity: Yes    Birth control/ protection: Surgical   Other Topics Concern  . Not on file   Social History Narrative  . No narrative on file     Review of Systems: General: negative for chills, fever, night sweats or weight changes.  Cardiovascular: negative for chest pain, dyspnea on exertion, edema, orthopnea, palpitations, paroxysmal nocturnal dyspnea or shortness of breath Dermatological: negative for rash Respiratory: negative for cough or wheezing Urologic: negative for hematuria Abdominal: negative for nausea, vomiting, diarrhea, bright red blood per rectum, melena, or hematemesis Neurologic: negative for visual changes, syncope, or dizziness All other systems reviewed and are otherwise negative except as noted above.    Blood pressure (!) 108/56, pulse 72, height 5\' 4"  (1.626 m), weight 85 lb (38.6 kg).  General appearance: alert and no distress Neck: no adenopathy, no JVD, supple, symmetrical, trachea midline, thyroid not enlarged, symmetric, no tenderness/mass/nodules and Soft bilateral carotid bruits Lungs: clear to auscultation bilaterally Heart: regular rate and rhythm, S1, S2 normal, no murmur, click, rub or gallop Extremities: extremities normal, atraumatic, no cyanosis or edema  EKG not performed today  ASSESSMENT AND PLAN:   Peripheral arterial disease ([place]) Ms. Henandez returns today for  follow-up of her peripheral arterial disease. I last saw her 04/05/16. She did have lifestyle limiting claudication with Dopplers that showed aortoiliac disease and a total right SFA with a right ABI 0.63 left of 0.61. Active begin her on Pletal which resulted in significant improvement in her walking ability. At this time she wishes to continue conservative/medical therapy. We will recheck lower extremity arterial Doppler studies in November and I will see her back in December for follow-up. She would be a candidate for endovascular therapy of her aortoiliac disease as a first step should she decide to pursue an invasive approach.      Lorretta Harp MD FACP,FACC,FAHA, [facility] 07/09/2016 11:06 AM

## 2016-07-10 ENCOUNTER — Telehealth: Payer: Self-pay | Admitting: Pulmonary Disease

## 2016-07-10 NOTE — Telephone Encounter (Signed)
Received a form from the pts pharmacy stating that the pts insurance will not cover the stiolto now.  They are requesting to have an alternative sent in.  RA please advise. Thanks

## 2016-07-11 DIAGNOSIS — E890 Postprocedural hypothyroidism: Secondary | ICD-10-CM | POA: Diagnosis not present

## 2016-07-11 DIAGNOSIS — I10 Essential (primary) hypertension: Secondary | ICD-10-CM | POA: Diagnosis not present

## 2016-07-11 NOTE — Telephone Encounter (Signed)
Cover my Meds calling a/b this pt PA and can be reached @ [phone]  REF # is [id number] said we migh be having trouble w/ her ins and that he might be able to help?Hillery Hunter

## 2016-07-11 NOTE — Telephone Encounter (Signed)
PA was started but it stated that the patient needed to reach out to the insurance company. Will call this afternoon while I'm working on PAs.

## 2016-07-11 NOTE — Telephone Encounter (Signed)
Spoke with CMM. They stated [facility] was having issues with electronic PAs and accidentally closed the claim. CMM is faxing over a hard copy of the PA to be submitted via fax. Nothing further was needed at time of call.

## 2016-07-11 NOTE — Telephone Encounter (Signed)
Spoke with the pt  She states that [place] had initiated PA through [employer] but all of the questions were not completed yet  She is asking if we meant to send this? I do not see documentation of a PA being started  Cherina, can you please advise, thanks!

## 2016-07-12 NOTE — Telephone Encounter (Signed)
PA has been faxed to [place]. Will await their response.

## 2016-07-15 NOTE — Telephone Encounter (Signed)
Per [facility], patient's Stiolto has been approved. [facility] [address] has been notified and it has been approved on their end as well.   Nothing further needed.

## 2016-08-08 ENCOUNTER — Ambulatory Visit: Payer: Medicare Other | Admitting: Pulmonary Disease

## 2016-08-18 ENCOUNTER — Other Ambulatory Visit: Payer: Self-pay | Admitting: Cardiology

## 2016-08-18 DIAGNOSIS — E785 Hyperlipidemia, unspecified: Secondary | ICD-10-CM

## 2016-08-19 NOTE — Telephone Encounter (Signed)
REFILL 

## 2016-09-11 DIAGNOSIS — I10 Essential (primary) hypertension: Secondary | ICD-10-CM | POA: Diagnosis not present

## 2016-09-11 DIAGNOSIS — E042 Nontoxic multinodular goiter: Secondary | ICD-10-CM | POA: Diagnosis not present

## 2016-09-11 DIAGNOSIS — D6489 Other specified anemias: Secondary | ICD-10-CM | POA: Diagnosis not present

## 2016-09-11 DIAGNOSIS — E46 Unspecified protein-calorie malnutrition: Secondary | ICD-10-CM | POA: Diagnosis not present

## 2016-09-11 DIAGNOSIS — R21 Rash and other nonspecific skin eruption: Secondary | ICD-10-CM | POA: Diagnosis not present

## 2016-09-11 DIAGNOSIS — K222 Esophageal obstruction: Secondary | ICD-10-CM | POA: Diagnosis not present

## 2016-09-11 DIAGNOSIS — E041 Nontoxic single thyroid nodule: Secondary | ICD-10-CM | POA: Diagnosis not present

## 2016-09-24 DIAGNOSIS — H26491 Other secondary cataract, right eye: Secondary | ICD-10-CM | POA: Diagnosis not present

## 2016-09-24 DIAGNOSIS — H524 Presbyopia: Secondary | ICD-10-CM | POA: Diagnosis not present

## 2016-09-24 DIAGNOSIS — H35373 Puckering of macula, bilateral: Secondary | ICD-10-CM | POA: Diagnosis not present

## 2016-09-26 ENCOUNTER — Ambulatory Visit (INDEPENDENT_AMBULATORY_CARE_PROVIDER_SITE_OTHER): Payer: Medicare Other | Admitting: Adult Health

## 2016-09-26 ENCOUNTER — Encounter: Payer: Self-pay | Admitting: Adult Health

## 2016-09-26 DIAGNOSIS — K222 Esophageal obstruction: Secondary | ICD-10-CM

## 2016-09-26 DIAGNOSIS — J449 Chronic obstructive pulmonary disease, unspecified: Secondary | ICD-10-CM

## 2016-09-26 NOTE — Patient Instructions (Addendum)
Continue on Stiolto Respimat 2 puffs daily , rinse after use.  Follow up with Dr. Elsworth Soho  In 3-4 months and As needed   Please contact office for sooner follow up if symptoms do not improve or worsen or seek emergency care

## 2016-09-26 NOTE — Assessment & Plan Note (Signed)
Stable without flare  Counseled on steroid use, potential complications, needs to verify correct dosing .   Plan  Patient Instructions  Continue on Stiolto Respimat 2 puffs daily , rinse after use.  Follow up with Dr. Elsworth Soho  In 3-4 months and As needed   Please contact office for sooner follow up if symptoms do not improve or worsen or seek emergency care
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
## 2016-09-26 NOTE — Assessment & Plan Note (Signed)
Doing well  Cont w/ follow up with GI at [facility]

## 2016-09-27 DIAGNOSIS — H00025 Hordeolum internum left lower eyelid: Secondary | ICD-10-CM | POA: Diagnosis not present

## 2016-10-02 DIAGNOSIS — L300 Nummular dermatitis: Secondary | ICD-10-CM | POA: Diagnosis not present

## 2016-10-16 DIAGNOSIS — Y999 Unspecified external cause status: Secondary | ICD-10-CM | POA: Diagnosis not present

## 2016-10-16 DIAGNOSIS — Z87891 Personal history of nicotine dependence: Secondary | ICD-10-CM | POA: Diagnosis not present

## 2016-10-16 DIAGNOSIS — X58XXXA Exposure to other specified factors, initial encounter: Secondary | ICD-10-CM | POA: Diagnosis not present

## 2016-10-16 DIAGNOSIS — K219 Gastro-esophageal reflux disease without esophagitis: Secondary | ICD-10-CM | POA: Diagnosis not present

## 2016-10-16 DIAGNOSIS — J449 Chronic obstructive pulmonary disease, unspecified: Secondary | ICD-10-CM | POA: Diagnosis not present

## 2016-10-16 DIAGNOSIS — I10 Essential (primary) hypertension: Secondary | ICD-10-CM | POA: Diagnosis not present

## 2016-10-16 DIAGNOSIS — I252 Old myocardial infarction: Secondary | ICD-10-CM | POA: Diagnosis not present

## 2016-10-16 DIAGNOSIS — T18128A Food in esophagus causing other injury, initial encounter: Secondary | ICD-10-CM | POA: Diagnosis not present

## 2016-10-16 DIAGNOSIS — Z886 Allergy status to analgesic agent status: Secondary | ICD-10-CM | POA: Diagnosis not present

## 2016-10-16 DIAGNOSIS — T18120A Food in esophagus causing compression of trachea, initial encounter: Secondary | ICD-10-CM | POA: Diagnosis not present

## 2016-10-16 DIAGNOSIS — Z887 Allergy status to serum and vaccine status: Secondary | ICD-10-CM | POA: Diagnosis not present

## 2016-10-30 DIAGNOSIS — Z961 Presence of intraocular lens: Secondary | ICD-10-CM | POA: Diagnosis not present

## 2016-10-30 DIAGNOSIS — H524 Presbyopia: Secondary | ICD-10-CM | POA: Diagnosis not present

## 2016-10-30 DIAGNOSIS — H35373 Puckering of macula, bilateral: Secondary | ICD-10-CM | POA: Diagnosis not present

## 2016-10-30 DIAGNOSIS — H0015 Chalazion left lower eyelid: Secondary | ICD-10-CM | POA: Diagnosis not present

## 2016-11-12 ENCOUNTER — Other Ambulatory Visit: Payer: Self-pay | Admitting: *Deleted

## 2016-11-12 DIAGNOSIS — E890 Postprocedural hypothyroidism: Secondary | ICD-10-CM | POA: Diagnosis not present

## 2016-11-12 DIAGNOSIS — I714 Abdominal aortic aneurysm, without rupture, unspecified: Secondary | ICD-10-CM

## 2016-12-06 ENCOUNTER — Ambulatory Visit (HOSPITAL_BASED_OUTPATIENT_CLINIC_OR_DEPARTMENT_OTHER)
Admission: RE | Admit: 2016-12-06 | Discharge: 2016-12-06 | Disposition: A | Discharge status: Home / Self Care | Payer: Medicare Other | Source: Ambulatory Visit | Attending: Cardiology | Admitting: Cardiology

## 2016-12-06 DIAGNOSIS — I700 Atherosclerosis of aorta: Secondary | ICD-10-CM | POA: Insufficient documentation

## 2016-12-06 DIAGNOSIS — I714 Abdominal aortic aneurysm, without rupture, unspecified: Secondary | ICD-10-CM

## 2016-12-10 ENCOUNTER — Ambulatory Visit (HOSPITAL_COMMUNITY)
Admission: RE | Admit: 2016-12-10 | Discharge: 2016-12-10 | Disposition: A | Discharge status: Home / Self Care | Payer: Medicare Other | Source: Ambulatory Visit | Attending: Cardiology | Admitting: Cardiology

## 2016-12-10 DIAGNOSIS — I739 Peripheral vascular disease, unspecified: Secondary | ICD-10-CM | POA: Insufficient documentation

## 2016-12-11 ENCOUNTER — Telehealth: Payer: Self-pay | Admitting: Adult Health

## 2016-12-11 MED ORDER — TIOTROPIUM BROMIDE-OLODATEROL 2.5-2.5 MCG/ACT IN AERS: 2.0000 | INHALATION_SPRAY | Freq: Every day | RESPIRATORY_TRACT | 0 refills | Status: DC

## 2016-12-11 NOTE — Telephone Encounter (Signed)
Sample of [place] has been given to Johnston to take to [facility]. Pt also wanted to know if she could have Flu shot tomorrow as well.  Pt states she has prod cough with yellow mucus  & wheezing. I have spoken with JJ, who states TP typically suggest waiting 2 weeks after sickness to get flu vaccine. Pt has been made aware of this information and voiced her understanding. Nothing further needed.

## 2016-12-12 ENCOUNTER — Ambulatory Visit (INDEPENDENT_AMBULATORY_CARE_PROVIDER_SITE_OTHER): Payer: Medicare Other | Admitting: Adult Health

## 2016-12-12 ENCOUNTER — Other Ambulatory Visit: Payer: Self-pay | Admitting: Cardiovascular Disease

## 2016-12-12 ENCOUNTER — Encounter: Payer: Self-pay | Admitting: Adult Health

## 2016-12-12 ENCOUNTER — Ambulatory Visit: Payer: Medicare Other | Admitting: Pulmonary Disease

## 2016-12-12 DIAGNOSIS — I739 Peripheral vascular disease, unspecified: Secondary | ICD-10-CM

## 2016-12-12 DIAGNOSIS — J441 Chronic obstructive pulmonary disease with (acute) exacerbation: Secondary | ICD-10-CM

## 2016-12-12 DIAGNOSIS — J9611 Chronic respiratory failure with hypoxia: Secondary | ICD-10-CM | POA: Diagnosis not present

## 2016-12-12 MED ORDER — PREDNISONE 10 MG PO TABS: ORAL_TABLET | ORAL | 0 refills | Status: DC

## 2016-12-12 MED ORDER — AZITHROMYCIN 250 MG PO TABS: ORAL_TABLET | ORAL | 0 refills | Status: AC
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
## 2016-12-12 NOTE — Assessment & Plan Note (Signed)
Cont on O2 with act  

## 2016-12-12 NOTE — Patient Instructions (Addendum)
Zpack take as directed.  Mucinex DM -liquid As needed  Cough/congestion  Prednisone taper over next week.  Use Albuterol Inhaler /Neb As needed  Wheezing /shortness of breath .  Continue on Stiolto daily . Rinse after use.  Continue on oxygen 3 l/m with activity .  Follow up with Dr. Lake Bells in 3 months and As needed   Please contact office for sooner follow up if symptoms do not improve or worsen or seek emergency care

## 2016-12-12 NOTE — Assessment & Plan Note (Signed)
Flare   Plan  Patient Instructions  Zpack take as directed.  Mucinex DM -liquid As needed  Cough/congestion  Prednisone taper over next week.  Use Albuterol Inhaler /Neb As needed  Wheezing /shortness of breath .  Continue on Stiolto daily . Rinse after use.  Continue on oxygen 3 l/m with activity .  Follow up with Dr. Lake Bells in 3 months and As needed   Please contact office for sooner follow up if symptoms do not improve or worsen or seek emergency care

## 2016-12-12 NOTE — Progress Notes (Signed)
Reviewed, agree 

## 2016-12-25 ENCOUNTER — Encounter: Payer: Self-pay | Admitting: Internal Medicine

## 2016-12-25 DIAGNOSIS — E46 Unspecified protein-calorie malnutrition: Secondary | ICD-10-CM | POA: Diagnosis not present

## 2016-12-25 DIAGNOSIS — Z7189 Other specified counseling: Secondary | ICD-10-CM | POA: Diagnosis not present

## 2016-12-25 DIAGNOSIS — E559 Vitamin D deficiency, unspecified: Secondary | ICD-10-CM | POA: Diagnosis not present

## 2016-12-25 DIAGNOSIS — E042 Nontoxic multinodular goiter: Secondary | ICD-10-CM | POA: Diagnosis not present

## 2016-12-25 DIAGNOSIS — R21 Rash and other nonspecific skin eruption: Secondary | ICD-10-CM | POA: Diagnosis not present

## 2016-12-25 DIAGNOSIS — Z0000 Encounter for general adult medical examination without abnormal findings: Secondary | ICD-10-CM | POA: Diagnosis not present

## 2016-12-25 DIAGNOSIS — I10 Essential (primary) hypertension: Secondary | ICD-10-CM | POA: Diagnosis not present

## 2016-12-25 DIAGNOSIS — J9611 Chronic respiratory failure with hypoxia: Secondary | ICD-10-CM | POA: Diagnosis not present

## 2016-12-25 DIAGNOSIS — D6489 Other specified anemias: Secondary | ICD-10-CM | POA: Diagnosis not present

## 2016-12-25 DIAGNOSIS — Z1389 Encounter for screening for other disorder: Secondary | ICD-10-CM | POA: Diagnosis not present

## 2016-12-25 DIAGNOSIS — Z23 Encounter for immunization: Secondary | ICD-10-CM | POA: Diagnosis not present

## 2016-12-25 DIAGNOSIS — J449 Chronic obstructive pulmonary disease, unspecified: Secondary | ICD-10-CM | POA: Diagnosis not present

## 2016-12-30 ENCOUNTER — Other Ambulatory Visit: Payer: Self-pay | Admitting: Adult Health

## 2017-01-02 DIAGNOSIS — E890 Postprocedural hypothyroidism: Secondary | ICD-10-CM | POA: Diagnosis not present

## 2017-01-09 DIAGNOSIS — E890 Postprocedural hypothyroidism: Secondary | ICD-10-CM | POA: Diagnosis not present

## 2017-01-16 ENCOUNTER — Other Ambulatory Visit: Payer: Self-pay | Admitting: Cardiology

## 2017-01-22 DIAGNOSIS — Z1231 Encounter for screening mammogram for malignant neoplasm of breast: Secondary | ICD-10-CM | POA: Diagnosis not present

## 2017-02-03 ENCOUNTER — Telehealth: Payer: Self-pay | Admitting: Adult Health

## 2017-02-03 MED ORDER — DOXYCYCLINE HYCLATE 100 MG PO TABS: 100.0000 mg | ORAL_TABLET | Freq: Two times a day (BID) | ORAL | 0 refills | Status: DC

## 2017-02-03 NOTE — Telephone Encounter (Signed)
Called and spoke with pt who stated she has had a cough with yellow mucus and SOB. Pt denies any body aches and had a temp of 99 yesterday, 02/02/17 evening.  Pt wants to know if an Rx can be called in.  Tammy, please advise on this.  Thanks!

## 2017-02-03 NOTE — Telephone Encounter (Signed)
This is really early with only 1 day of symptom Try mucinex dm  , fluids and rest along with her inhalers .  If symptoms persist with discolored mucus can take  Doxycycline 100mg  Twice daily  #14 . No refills .  Please contact office for sooner follow up if symptoms do not improve or worsen or seek emergency care  Make sure she keeps her follow up .ov

## 2017-02-03 NOTE — Telephone Encounter (Signed)
Called pt and advised message from the provider. Pt understood and verbalized understanding. Nothing further is needed.   Rx sent in, pt states she has been dealing with this for 1 week.

## 2017-02-06 ENCOUNTER — Ambulatory Visit (INDEPENDENT_AMBULATORY_CARE_PROVIDER_SITE_OTHER): Payer: Medicare Other | Admitting: Adult Health

## 2017-02-06 ENCOUNTER — Encounter: Payer: Self-pay | Admitting: Adult Health

## 2017-02-06 DIAGNOSIS — J441 Chronic obstructive pulmonary disease with (acute) exacerbation: Secondary | ICD-10-CM

## 2017-02-06 DIAGNOSIS — J9611 Chronic respiratory failure with hypoxia: Secondary | ICD-10-CM

## 2017-02-06 MED ORDER — PREDNISONE 10 MG PO TABS: ORAL_TABLET | ORAL | 0 refills | Status: DC
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
## 2017-02-06 NOTE — Patient Instructions (Addendum)
Finish Doxycycline .  Mucinex DM -liquid As needed  Cough/congestion  Prednisone taper over next week.  Use Albuterol Inhaler /Neb As needed  Wheezing /shortness of breath .  Continue on Stiolto daily . Rinse after use.  Continue on oxygen 3 l/m with activity .  Follow up with Dr. Lake Bells in 3 months and As needed   Please contact office for sooner follow up if symptoms do not improve or worsen or seek emergency care

## 2017-02-06 NOTE — Assessment & Plan Note (Signed)
Cont on O2 .  

## 2017-02-06 NOTE — Assessment & Plan Note (Signed)
Slowly resolving COPD flare   Plan  Patient Instructions  Finish Doxycycline .  Mucinex DM -liquid As needed  Cough/congestion  Prednisone taper over next week.  Use Albuterol Inhaler /Neb As needed  Wheezing /shortness of breath .  Continue on Stiolto daily . Rinse after use.  Continue on oxygen 3 l/m with activity .  Follow up with Dr. Lake Bells in 3 months and As needed   Please contact office for sooner follow up if symptoms do not improve or worsen or seek emergency care

## 2017-02-10 NOTE — Progress Notes (Signed)
Reviewed, agree 

## 2017-02-14 DIAGNOSIS — N952 Postmenopausal atrophic vaginitis: Secondary | ICD-10-CM | POA: Diagnosis not present

## 2017-02-14 DIAGNOSIS — F419 Anxiety disorder, unspecified: Secondary | ICD-10-CM | POA: Diagnosis not present

## 2017-02-14 DIAGNOSIS — Z9189 Other specified personal risk factors, not elsewhere classified: Secondary | ICD-10-CM | POA: Diagnosis not present

## 2017-02-14 DIAGNOSIS — Z7251 High risk heterosexual behavior: Secondary | ICD-10-CM | POA: Diagnosis not present

## 2017-02-21 ENCOUNTER — Telehealth: Payer: Self-pay | Admitting: Adult Health

## 2017-02-21 MED ORDER — ALBUTEROL SULFATE HFA 108 (90 BASE) MCG/ACT IN AERS: INHALATION_SPRAY | RESPIRATORY_TRACT | 1 refills | Status: DC

## 2017-02-21 NOTE — Telephone Encounter (Signed)
Received fax from [facility] in [facility] requesting 90 day Rx for pt's Proventil HFA 2puffs every 6 hours as needed Last ov 1.10.19 w/ TP  Rx sent Nothing further needed; will sign off

## 2017-02-27 DIAGNOSIS — M810 Age-related osteoporosis without current pathological fracture: Secondary | ICD-10-CM | POA: Diagnosis not present

## 2017-02-27 DIAGNOSIS — E2839 Other primary ovarian failure: Secondary | ICD-10-CM | POA: Diagnosis not present

## 2017-03-05 ENCOUNTER — Telehealth: Payer: Self-pay | Admitting: Adult Health

## 2017-03-05 ENCOUNTER — Other Ambulatory Visit: Payer: Self-pay | Admitting: Adult Health

## 2017-03-05 MED ORDER — PREDNISONE 10 MG PO TABS: ORAL_TABLET | ORAL | 0 refills | Status: DC

## 2017-03-05 NOTE — Telephone Encounter (Signed)
Please advise patient is having trouble breathing. She uses 2 liters per minute. Patient states this has been going on for a few days. She is having tightness is chest. Denies fever, body aches.     RB please advise.

## 2017-03-05 NOTE — Telephone Encounter (Signed)
She was just treated for an acute exacerbation in mid January.  Unclear why she continues to flare but she needs to be seen in order to work this up.  In the meantime you can give her prednisone as below but please make her an appointment, preferably with Dr. Elsworth Soho as she has not seen him lately.  Pred: Take 40mg  daily for 3 days, then 30mg  daily for 3 days, then 20mg  daily for 3 days, then 10mg  daily for 3 days, then stop

## 2017-03-05 NOTE — Telephone Encounter (Signed)
Unable to reach patient to let her know about appointment. LMTCB  Went ahead and sent prescription to patients pharmacy.

## 2017-03-06 ENCOUNTER — Other Ambulatory Visit: Payer: Self-pay | Admitting: Cardiovascular Disease

## 2017-03-06 NOTE — Telephone Encounter (Signed)
REFILL 

## 2017-03-06 NOTE — Telephone Encounter (Signed)
Spoke with pt. She is aware that we sent prednisone to her pharmacy. Nothing further was needed.

## 2017-03-17 DIAGNOSIS — I255 Ischemic cardiomyopathy: Secondary | ICD-10-CM | POA: Diagnosis not present

## 2017-03-17 DIAGNOSIS — I952 Hypotension due to drugs: Secondary | ICD-10-CM | POA: Diagnosis not present

## 2017-03-17 DIAGNOSIS — M810 Age-related osteoporosis without current pathological fracture: Secondary | ICD-10-CM | POA: Diagnosis not present

## 2017-03-17 DIAGNOSIS — E46 Unspecified protein-calorie malnutrition: Secondary | ICD-10-CM | POA: Diagnosis not present

## 2017-03-17 DIAGNOSIS — L659 Nonscarring hair loss, unspecified: Secondary | ICD-10-CM | POA: Diagnosis not present

## 2017-03-20 DIAGNOSIS — E559 Vitamin D deficiency, unspecified: Secondary | ICD-10-CM | POA: Diagnosis not present

## 2017-03-20 DIAGNOSIS — M810 Age-related osteoporosis without current pathological fracture: Secondary | ICD-10-CM | POA: Diagnosis not present

## 2017-03-21 DIAGNOSIS — L218 Other seborrheic dermatitis: Secondary | ICD-10-CM | POA: Diagnosis not present

## 2017-03-21 DIAGNOSIS — L648 Other androgenic alopecia: Secondary | ICD-10-CM | POA: Diagnosis not present

## 2017-03-21 DIAGNOSIS — L308 Other specified dermatitis: Secondary | ICD-10-CM | POA: Diagnosis not present

## 2017-03-21 DIAGNOSIS — L818 Other specified disorders of pigmentation: Secondary | ICD-10-CM | POA: Diagnosis not present

## 2017-03-26 DIAGNOSIS — I951 Orthostatic hypotension: Secondary | ICD-10-CM | POA: Diagnosis not present

## 2017-03-26 DIAGNOSIS — M810 Age-related osteoporosis without current pathological fracture: Secondary | ICD-10-CM | POA: Diagnosis not present

## 2017-03-27 ENCOUNTER — Ambulatory Visit: Payer: Medicare Other | Admitting: Pulmonary Disease
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
## 2017-04-07 ENCOUNTER — Ambulatory Visit: Payer: Medicare Other | Admitting: Pulmonary Disease

## 2017-04-07 ENCOUNTER — Ambulatory Visit (INDEPENDENT_AMBULATORY_CARE_PROVIDER_SITE_OTHER): Payer: Medicare Other | Admitting: Adult Health

## 2017-04-07 ENCOUNTER — Encounter: Payer: Self-pay | Admitting: Adult Health

## 2017-04-07 VITALS — BP 138/76 | HR 79 | Ht 64.0 in | Wt 84.2 lb

## 2017-04-07 DIAGNOSIS — I714 Abdominal aortic aneurysm, without rupture, unspecified: Secondary | ICD-10-CM

## 2017-04-07 DIAGNOSIS — I350 Nonrheumatic aortic (valve) stenosis: Secondary | ICD-10-CM

## 2017-04-07 DIAGNOSIS — I2510 Atherosclerotic heart disease of native coronary artery without angina pectoris: Secondary | ICD-10-CM

## 2017-04-07 DIAGNOSIS — I519 Heart disease, unspecified: Secondary | ICD-10-CM | POA: Diagnosis not present

## 2017-04-07 MED ORDER — NITROGLYCERIN 0.4 MG SL SUBL: 0.4000 mg | SUBLINGUAL_TABLET | SUBLINGUAL | 3 refills | Status: DC | PRN

## 2017-04-07 NOTE — Patient Instructions (Signed)
Medication Instructions:  NO CHANGES- Your physician recommends that you continue on your current medications as directed. Please refer to the Current Medication list given to you today.  If you need a refill on your cardiac medications before your next appointment, please call your pharmacy.  Testing/Procedures: Echocardiogram - Your physician has requested that you have an echocardiogram. Echocardiography is a painless test that uses sound waves to create images of your heart. It provides your doctor with information about the size and shape of your heart and how well your heart's chambers and valves are working. This procedure takes approximately one hour. There are no restrictions for this procedure. This will be performed at our [facility] location - [address].  Follow-Up: Your physician wants you to follow-up in: [place] should receive a reminder letter in the mail two months in advance. If you do not receive a letter, please call our office 07-2017 to schedule the 09-2017 follow-up appointment.   Thank you for choosing [facility]!!

## 2017-04-09 DIAGNOSIS — M810 Age-related osteoporosis without current pathological fracture: Secondary | ICD-10-CM | POA: Diagnosis not present

## 2017-04-09 DIAGNOSIS — E559 Vitamin D deficiency, unspecified: Secondary | ICD-10-CM | POA: Diagnosis not present

## 2017-04-10 ENCOUNTER — Encounter: Payer: Self-pay | Admitting: Adult Health

## 2017-04-10 ENCOUNTER — Ambulatory Visit (HOSPITAL_BASED_OUTPATIENT_CLINIC_OR_DEPARTMENT_OTHER)
Admission: RE | Admit: 2017-04-10 | Discharge: 2017-04-10 | Disposition: A | Discharge status: Home / Self Care | Payer: Medicare Other | Source: Ambulatory Visit | Attending: Adult Health | Admitting: Adult Health

## 2017-04-10 ENCOUNTER — Ambulatory Visit (INDEPENDENT_AMBULATORY_CARE_PROVIDER_SITE_OTHER): Payer: Medicare Other | Admitting: Adult Health

## 2017-04-10 VITALS — BP 116/68 | HR 79 | Ht 64.0 in | Wt 84.4 lb

## 2017-04-10 DIAGNOSIS — J441 Chronic obstructive pulmonary disease with (acute) exacerbation: Secondary | ICD-10-CM

## 2017-04-10 DIAGNOSIS — J449 Chronic obstructive pulmonary disease, unspecified: Secondary | ICD-10-CM | POA: Insufficient documentation

## 2017-04-10 DIAGNOSIS — I700 Atherosclerosis of aorta: Secondary | ICD-10-CM | POA: Diagnosis not present

## 2017-04-10 DIAGNOSIS — J9611 Chronic respiratory failure with hypoxia: Secondary | ICD-10-CM

## 2017-04-10 DIAGNOSIS — E43 Unspecified severe protein-calorie malnutrition: Secondary | ICD-10-CM | POA: Diagnosis not present

## 2017-04-10 DIAGNOSIS — I2510 Atherosclerotic heart disease of native coronary artery without angina pectoris: Secondary | ICD-10-CM

## 2017-04-10 DIAGNOSIS — B3781 Candidal esophagitis: Secondary | ICD-10-CM | POA: Diagnosis not present

## 2017-04-10 DIAGNOSIS — R0600 Dyspnea, unspecified: Secondary | ICD-10-CM | POA: Diagnosis present

## 2017-04-10 DIAGNOSIS — R0602 Shortness of breath: Secondary | ICD-10-CM | POA: Diagnosis not present

## 2017-04-10 MED ORDER — PREDNISONE 10 MG PO TABS: ORAL_TABLET | ORAL | 0 refills | Status: DC

## 2017-04-10 MED ORDER — ALBUTEROL SULFATE HFA 108 (90 BASE) MCG/ACT IN AERS: INHALATION_SPRAY | RESPIRATORY_TRACT | 5 refills | Status: DC

## 2017-04-10 MED ORDER — TIOTROPIUM BROMIDE-OLODATEROL 2.5-2.5 MCG/ACT IN AERS: 2.0000 | INHALATION_SPRAY | Freq: Every day | RESPIRATORY_TRACT | 5 refills | Status: DC
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
## 2017-04-10 NOTE — Assessment & Plan Note (Signed)
Cont On O2 with act

## 2017-04-10 NOTE — Progress Notes (Signed)
Reviewed, agree 

## 2017-04-10 NOTE — Assessment & Plan Note (Signed)
Recurrent exacerbation (GOLD IV COPD pt with low reserve)  Steroid taper . May have to start on low dose steroids to help with exacerbations, hold for now ) consider ICS inhaler on return  Doubt can tolerate daliresp with GI issues   Plan  Patient Instructions  Chest xray today .  Prednisone taper over next week.  Mucinex DM Twice daily  As needed  Cough/congestion .  Continue on Stiolto daily . Rinse after use.  Continue on oxygen 2 l/m with activity .  Follow up with Dr. Lake Bells in 3 months and As needed   Please contact office for sooner follow up if symptoms do not improve or worsen or seek emergency care

## 2017-04-10 NOTE — Assessment & Plan Note (Signed)
Cont w/ ensure

## 2017-04-10 NOTE — Patient Instructions (Addendum)
Chest xray today .  Prednisone taper over next week.  Mucinex DM Twice daily  As needed  Cough/congestion .  Continue on Stiolto daily . Rinse after use.  Continue on oxygen 2 l/m with activity .  Follow up with Dr. Lake Bells in 3 months and As needed   Please contact office for sooner follow up if symptoms do not improve or worsen or seek emergency care

## 2017-04-14 ENCOUNTER — Other Ambulatory Visit: Payer: Self-pay | Admitting: Cardiovascular Disease

## 2017-04-14 NOTE — Telephone Encounter (Signed)
REFILL 

## 2017-05-07 ENCOUNTER — Other Ambulatory Visit (HOSPITAL_COMMUNITY): Payer: Medicare Other

## 2017-05-09 ENCOUNTER — Ambulatory Visit (HOSPITAL_COMMUNITY): Payer: Medicare Other | Attending: Cardiology

## 2017-05-09 ENCOUNTER — Encounter (INDEPENDENT_AMBULATORY_CARE_PROVIDER_SITE_OTHER): Payer: Self-pay

## 2017-05-09 ENCOUNTER — Other Ambulatory Visit: Payer: Self-pay

## 2017-05-09 DIAGNOSIS — I739 Peripheral vascular disease, unspecified: Secondary | ICD-10-CM | POA: Diagnosis not present

## 2017-05-09 DIAGNOSIS — I509 Heart failure, unspecified: Secondary | ICD-10-CM | POA: Insufficient documentation

## 2017-05-09 DIAGNOSIS — I2510 Atherosclerotic heart disease of native coronary artery without angina pectoris: Secondary | ICD-10-CM | POA: Insufficient documentation

## 2017-05-09 DIAGNOSIS — I110 Hypertensive heart disease with heart failure: Secondary | ICD-10-CM | POA: Insufficient documentation

## 2017-05-09 DIAGNOSIS — I059 Rheumatic mitral valve disease, unspecified: Secondary | ICD-10-CM | POA: Diagnosis not present

## 2017-05-09 DIAGNOSIS — Z87891 Personal history of nicotine dependence: Secondary | ICD-10-CM | POA: Insufficient documentation

## 2017-05-09 DIAGNOSIS — E785 Hyperlipidemia, unspecified: Secondary | ICD-10-CM | POA: Diagnosis not present

## 2017-05-09 DIAGNOSIS — I255 Ischemic cardiomyopathy: Secondary | ICD-10-CM | POA: Diagnosis not present

## 2017-05-09 DIAGNOSIS — I719 Aortic aneurysm of unspecified site, without rupture: Secondary | ICD-10-CM | POA: Insufficient documentation

## 2017-05-09 DIAGNOSIS — J449 Chronic obstructive pulmonary disease, unspecified: Secondary | ICD-10-CM | POA: Insufficient documentation

## 2017-05-09 DIAGNOSIS — I252 Old myocardial infarction: Secondary | ICD-10-CM | POA: Insufficient documentation

## 2017-05-09 DIAGNOSIS — I083 Combined rheumatic disorders of mitral, aortic and tricuspid valves: Secondary | ICD-10-CM | POA: Diagnosis not present

## 2017-05-14 ENCOUNTER — Telehealth: Payer: Self-pay | Admitting: Cardiovascular Disease

## 2017-05-14 DIAGNOSIS — M1990 Unspecified osteoarthritis, unspecified site: Secondary | ICD-10-CM | POA: Diagnosis not present

## 2017-05-14 DIAGNOSIS — R21 Rash and other nonspecific skin eruption: Secondary | ICD-10-CM | POA: Diagnosis not present

## 2017-05-14 DIAGNOSIS — M2550 Pain in unspecified joint: Secondary | ICD-10-CM | POA: Diagnosis not present

## 2017-05-14 NOTE — Telephone Encounter (Signed)
New Message: ° ° ° ° ° ° °Pt is returning a call °

## 2017-05-14 NOTE — Telephone Encounter (Signed)
lmtcb

## 2017-05-15 NOTE — Telephone Encounter (Signed)
Notes recorded by Lorretta Harp, MD on 05/12/2017 at 10:39 AM EDT Normal LV systolic function, grade 2 diastolic dysfunction, moderate pulmonary hypertension.  Patient called with echo results

## 2017-05-15 NOTE — Telephone Encounter (Signed)
F/u Call:  Patient returning call

## 2017-05-18 ENCOUNTER — Other Ambulatory Visit: Payer: Self-pay | Admitting: Adult Health

## 2017-05-23 DIAGNOSIS — L408 Other psoriasis: Secondary | ICD-10-CM | POA: Diagnosis not present

## 2017-06-02 ENCOUNTER — Telehealth: Payer: Self-pay | Admitting: Pulmonary Disease

## 2017-06-02 ENCOUNTER — Encounter: Payer: Self-pay | Admitting: Pulmonary Disease

## 2017-06-02 ENCOUNTER — Ambulatory Visit (INDEPENDENT_AMBULATORY_CARE_PROVIDER_SITE_OTHER): Payer: Medicare Other | Admitting: Pulmonary Disease

## 2017-06-02 VITALS — BP 106/64 | HR 77 | Ht 64.0 in | Wt 84.4 lb

## 2017-06-02 DIAGNOSIS — J441 Chronic obstructive pulmonary disease with (acute) exacerbation: Secondary | ICD-10-CM | POA: Diagnosis not present

## 2017-06-02 DIAGNOSIS — R634 Abnormal weight loss: Secondary | ICD-10-CM | POA: Diagnosis not present

## 2017-06-02 DIAGNOSIS — J449 Chronic obstructive pulmonary disease, unspecified: Secondary | ICD-10-CM

## 2017-06-02 DIAGNOSIS — J9611 Chronic respiratory failure with hypoxia: Secondary | ICD-10-CM | POA: Diagnosis not present

## 2017-06-02 DIAGNOSIS — E43 Unspecified severe protein-calorie malnutrition: Secondary | ICD-10-CM

## 2017-06-02 MED ORDER — FLUTTER DEVI: 1.0000 | Freq: Once | 0 refills | Status: AC

## 2017-06-02 MED ORDER — PREDNISONE 10 MG PO TABS: ORAL_TABLET | ORAL | 0 refills | Status: DC

## 2017-06-02 NOTE — Patient Instructions (Addendum)
Mucinex DM -liquid As needed  Cough/congestion  Flutter Valve provided Use Albuterol Inhaler /Neb As needed  Wheezing /shortness of breath .  Prednisone taper over next week.    4 tabs for 2 days, then 3 tabs for 2 days, 2 tabs for 2 days, then 1 tab for 2 days, then stop Continue on Stiolto daily . Rinse after use.  Continue on oxygen 2 l/m with activity .  Oxymetry walk 06/02/17 in office  Set up for an overnight oximetry test on room air .  Follow up with Dr. Lake Bells in 4-6 weeks and As needed   Please contact office for sooner follow up if symptoms do not improve or worsen or seek emergency care

## 2017-06-02 NOTE — Telephone Encounter (Signed)
Shamir from [place].  Winnifred Friar ID# is not working and need a supervising provider's ID#.

## 2017-06-02 NOTE — Assessment & Plan Note (Signed)
Right lower lobe wheeze present Prednisone taper today Oximetry walk in office today Overnight exam oximetry set up with [facility] Discussed importance of triggers Medication regimen reviewed with patient and family to ensure compliance

## 2017-06-02 NOTE — Assessment & Plan Note (Signed)
Continue on O2 with activity We will do home O2 oximetry study to see if oxygen is needed at night 6-minute walk performed in office today sats did not drop below 92% patient can only perform one lap

## 2017-06-02 NOTE — Assessment & Plan Note (Addendum)
Continue Ensure Track meals Report to primary care this month status of diet and concerns about nutrition Family members to discuss protein supplements with primary care

## 2017-06-02 NOTE — Telephone Encounter (Signed)
Called and spoke to patient. Patient stated she needed an appointment for today because she isn't feeling well with increased shortness of breath. Scheduled her to see [name], NP for 12:00

## 2017-06-02 NOTE — Progress Notes (Signed)
Subjective:    Patient ID: Tonya Harper, female    DOB: July 01, 1945, 72 y.o.   MRN: [id number]   HPI  06/02/2017 Acute Visit (COPD exacerbation )  72 year old female patient today presents with Tonya Harper and Tonya Harper for acute visit due to recent sob episode on 06/01/17 when going out to eat. Using oxygen concentrator at home occasionally 2 L at rest or with activity. Family stating concerns that pt may not be compliant with use.  Patient reporting they do not use oxygen regularly but only when needed or out of breath during the day but uses 2 L at night.  Patient reporting multiple shortness of breath episodes over the past, a couple of months.  Patient reporting triggers such as seasonal pollen heat as well as physical exertion going up and down stairs or provoking shortness of breath over the past couple of months.  Family growing more concerned resulting in present appointment today.  Reporting compliance with medication use.  2 puffs stiolto respimat  2x SABA use daily 2x SABA nebulizer use daily  Former smoker 20 years - Quit 2013  Last appointment was on April 10, 2017 seen by NP chest x-ray was performed as well as prednisone taper was given patient reporting shortness of breath was better at that moment in time.   ROS:  Review of Systems  Constitutional:    +weight loss, +fatigue, no night sweats,  No fevers, or no  lassitude  HEENT:   No headaches,  +Difficulty swallowing,  Tooth/dental problems, or  Sore throat, No sneezing, itching, ear ache, nasal congestion, post nasal drip, +mucous with cough   CV:  No chest pain,  orthopnea, PND, swelling in lower extremities, anasarca, dizziness, palpitations, syncope.   GI  +loss of appetite, No heartburn, indigestion, abdominal pain, nausea, vomiting, diarrhea, change in bowel habits, bloody stools.  Resp: +multiple episodes of SOB with exertion over past few weeks, + mucus, +productive cough with green mucous, No wheezing.  No chest wall  deformity  Skin: no rash, lesions, no skin changes.  GU: no dysuria, change in color of urine, no urgency or frequency.  No flank pain, no hematuria   MS:  No joint pain or swelling.  No decreased range of motion.  No back pain.  Psych:  No change in mood or affect. No depression or anxiety.  No memory loss.      Objective:   Physical Exam BP 106/64 (BP Location: Left Arm, Cuff Size: Small)   Pulse 77   Ht 5\' 4"  (1.626 m)   Wt 84 lb 6.4 oz (38.3 kg)   SpO2 91%   BMI 14.49 kg/m  Filed Weights   06/02/17 1227  Weight: 84 lb 6.4 oz (38.3 kg)    Physical Exam  BP 106/64 (BP Location: Left Arm, Cuff Size: Small)   Pulse 77   Ht 5\' 4"  (1.626 m)   Wt 84 lb 6.4 oz (38.3 kg)   SpO2 91%   BMI 14.49 kg/m   GEN: A/Ox3; pleasant , NAD, thin, poor nutrition, appears stated age   HEENT:  [place]/AT,  EACs-clear, TMs-wnl, NOSE-clear,  THROAT-clear, no lesions, no postnasal drip or exudate noted.   NECK:  Supple w/ fair ROM; no JVD; normal carotid impulses w/o bruits; no thyromegaly or nodules palpated; no lymphadenopathy.    RESP:    +RLL Posterior expiratory wheezes otherwise clear, no accessory muscle use  CARD:  RRR, no m/r/g, no peripheral edema, pulses intact: radial 2+  bilaterally, DP 2+ bilaterally, no cyanosis or clubbing.  GI:   Soft & nt; no organomegaly or masses detected.   Musco: Warm bilaterally, no deformities or joint swelling noted.   Neuro: alert, no focal deficits noted.    Skin: Warm, no lesions or rashes  Oxymetry walk performed in office today. 1 lap completed before patient needed to stop. O2 sats remained between 92-95 percent. There were no drops below 92 percent on the walk.  Results discussed with patient will continue to do home overnight study.  SIX MIN WALK 06/02/2017 10/01/2012 10/01/2012 02/07/2011  Supplimental Oxygen during Test? (L/min) - Yes No No  O2 Flow Rate - 3 - -  Type - Continuous - -  Tech Comments: Only able to complete 1 lap, walked at a  slow rate. SOB and had to sit by end of lap.  - - -        Assessment & Plan:   Protein-calorie malnutrition, severe Continue Ensure Track meals Report to primary care this month status of diet and concerns about nutrition Family members to discuss protein supplements with primary care  Weight loss Continue Ensure Track diet and nutrition intake daily Discussed diet and nutrition intake report with primary care later on this month Discuss protein supplements with primary care  Chronic respiratory failure Continue on O2 with activity We will do home O2 oximetry study to see if oxygen is needed at night 6-minute walk performed in office today sats did not drop below 92% patient can only perform one lap    COPD exacerbation (HCC) Mild exacerbation prone to recurrent flares today with minor flare Right lower lobe wheeze present  Plan Prednisone taper Oximetry walk in office today Overnight oximetry set up at home to [facility] Discussed importance of triggers with patient and family Medication regimen reviewed and educated on to ensure compliance  COPD (chronic obstructive pulmonary disease) ([place]) Right lower lobe wheeze present Prednisone taper today Oximetry walk in office today Overnight exam oximetry set up with [facility] Discussed importance of triggers Medication regimen reviewed with patient and family to ensure compliance   Return in about 1 month (around 06/30/2017), or if symptoms worsen or fail to improve, for Follow up with Tonya Harper.   [name] FNP-C

## 2017-06-02 NOTE — Assessment & Plan Note (Deleted)
Mild Exacerbation -prone to recurrent flares . Today with  Right lower lobe wheeze present  Plan  Prednisone taper Oximetry walk in office today Overnight oximetry set up at home through [facility] Discussed importance of triggers Medication regimen reviewed and educated on to ensure compliance

## 2017-06-02 NOTE — Assessment & Plan Note (Signed)
Mild exacerbation prone to recurrent flares today with minor flare Right lower lobe wheeze present  Plan Prednisone taper Oximetry walk in office today Overnight oximetry set up at home to [facility] Discussed importance of triggers with patient and family Medication regimen reviewed and educated on to ensure compliance

## 2017-06-02 NOTE — Assessment & Plan Note (Signed)
Continue Ensure Track diet and nutrition intake daily Discussed diet and nutrition intake report with primary care later on this month Discuss protein supplements with primary care

## 2017-06-02 NOTE — Telephone Encounter (Signed)
Called and spoke with [facility] from [place]. He states that when the NPI of [name] is put in it does not show a matching provider. They asked for his DEA. When that was given and put it, it comes up still as non matching provider. Medication was placed under TP for now.    Lillia Pauls states that they are running the mediation under patients medicare. Medication has been sent under TP for now. Will speak with Daneil Dan. Nothing further needed at this time.

## 2017-06-04 ENCOUNTER — Telehealth: Payer: Self-pay | Admitting: Pulmonary Disease

## 2017-06-04 NOTE — Telephone Encounter (Signed)
Spoke with the pt  She states that in her instructions she got with the flutter valve, she is supposed to have a separate piece that attaches to the end that you can turn  I advised that the part she is referring to is already attached, and she simply can turn the end of the valve to up the resistance  She verbalized understanding and states nothing further needed

## 2017-06-04 NOTE — Progress Notes (Signed)
Reviewed, agree 

## 2017-06-12 ENCOUNTER — Other Ambulatory Visit: Payer: Self-pay | Admitting: Adult Health

## 2017-06-16 DIAGNOSIS — R0902 Hypoxemia: Secondary | ICD-10-CM | POA: Diagnosis not present

## 2017-06-16 DIAGNOSIS — J449 Chronic obstructive pulmonary disease, unspecified: Secondary | ICD-10-CM | POA: Diagnosis not present

## 2017-06-18 ENCOUNTER — Telehealth: Payer: Self-pay | Admitting: Pulmonary Disease

## 2017-06-18 NOTE — Telephone Encounter (Signed)
Patient seen by [name] NP on 5.6.19 ONO on room air ordered to see if patient still needs O2 at bedtime  Received 5.20.19 ONO results Reviewed by BM: continue home O2 2L w/ sleep and exertion  Called spoke with patient and discussed the above results Pt voiced her understanding   Pt did ask about paperwork for a POC but I see no mention of this in the office note Aaron Edelman please advise, thank you  **NOTE: if we proceed with a POC patient will need to be qualified as she receives O2 therapy through [facility].  This can be done at her upcoming visit with BQ on 6.6.19**

## 2017-06-18 NOTE — Telephone Encounter (Signed)
We did attempt an oximetry walk when the patient was seen on 06/02/2017.  During this walk patient was only able to complete one lap.  Due to fatigue/SOB.  Oxygen saturations did not drop below 90 to 92%.  It is more than fine with having the patient retested to see if she can qualify for POC system through [facility] on her 07/03/17 appointment with [place].[name] FNP

## 2017-06-19 ENCOUNTER — Other Ambulatory Visit: Payer: Self-pay | Admitting: Cardiovascular Disease

## 2017-06-19 NOTE — Telephone Encounter (Signed)
Called spoke with patient, discussed with her Brian's recommendations Pt okay with requalifying for POC at next visit Nothing further needed; will sign off

## 2017-06-25 DIAGNOSIS — J9611 Chronic respiratory failure with hypoxia: Secondary | ICD-10-CM | POA: Diagnosis not present

## 2017-06-25 DIAGNOSIS — E890 Postprocedural hypothyroidism: Secondary | ICD-10-CM | POA: Diagnosis not present

## 2017-06-25 DIAGNOSIS — L309 Dermatitis, unspecified: Secondary | ICD-10-CM | POA: Diagnosis not present

## 2017-06-25 DIAGNOSIS — M810 Age-related osteoporosis without current pathological fracture: Secondary | ICD-10-CM | POA: Diagnosis not present

## 2017-06-27 DIAGNOSIS — L408 Other psoriasis: Secondary | ICD-10-CM | POA: Diagnosis not present

## 2017-07-03 ENCOUNTER — Encounter: Payer: Self-pay | Admitting: Pulmonary Disease

## 2017-07-03 ENCOUNTER — Ambulatory Visit (INDEPENDENT_AMBULATORY_CARE_PROVIDER_SITE_OTHER): Payer: Medicare Other | Admitting: Pulmonary Disease

## 2017-07-03 VITALS — BP 90/60 | HR 69 | Ht 64.0 in | Wt 74.1 lb

## 2017-07-03 DIAGNOSIS — J9610 Chronic respiratory failure, unspecified whether with hypoxia or hypercapnia: Secondary | ICD-10-CM | POA: Diagnosis not present

## 2017-07-03 DIAGNOSIS — I2510 Atherosclerotic heart disease of native coronary artery without angina pectoris: Secondary | ICD-10-CM

## 2017-07-03 MED ORDER — FLUTICASONE-UMECLIDIN-VILANT 100-62.5-25 MCG/INH IN AEPB: 1.0000 | INHALATION_SPRAY | Freq: Every day | RESPIRATORY_TRACT | 0 refills | Status: DC

## 2017-07-03 NOTE — Patient Instructions (Signed)
Severe COPD: Stop Stiolto Try taking Trelegy 1 puff daily no matter how you feel.  We will give you a sample of this medicine today and if you are doing okay on it please call us so that we can call in a prescription Continue using albuterol as needed for chest tightness wheezing or shortness of breath Get a flu shot when they become available over the next few months  Chest congestion: Use Mucinex as needed for chest congestion Use the flutter valve 4 to 5 breaths, 4-5 times a day If you still have symptoms of chest congestion after the next visit with the addition of the inhaled steroid we can consider using hypertonic saline nebulizers to help you with mucus production  Chronic respiratory failure with hypoxemia: You really need to use 2 L of oxygen when you exert yourself.  We will send a order for a portable oxygen concentrator.  Follow up in 2 months
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
## 2017-07-11 ENCOUNTER — Telehealth: Payer: Self-pay | Admitting: Pulmonary Disease

## 2017-07-11 MED ORDER — FLUTICASONE-UMECLIDIN-VILANT 100-62.5-25 MCG/INH IN AEPB: 1.0000 | INHALATION_SPRAY | Freq: Every day | RESPIRATORY_TRACT | 3 refills | Status: DC

## 2017-07-11 NOTE — Telephone Encounter (Signed)
Called and spoke with patient she is needing a refill of Trellegy. Refill sent. Nothing further needed.

## 2017-08-06 DIAGNOSIS — L408 Other psoriasis: Secondary | ICD-10-CM | POA: Diagnosis not present

## 2017-08-15 ENCOUNTER — Other Ambulatory Visit: Payer: Self-pay | Admitting: Cardiology

## 2017-08-15 DIAGNOSIS — E785 Hyperlipidemia, unspecified: Secondary | ICD-10-CM

## 2017-08-15 NOTE — Telephone Encounter (Signed)
Rx request sent to pharmacy.  

## 2017-09-02 ENCOUNTER — Telehealth: Payer: Self-pay | Admitting: Pulmonary Disease

## 2017-09-02 MED ORDER — FLUTICASONE-UMECLIDIN-VILANT 100-62.5-25 MCG/INH IN AEPB: 1.0000 | INHALATION_SPRAY | Freq: Every day | RESPIRATORY_TRACT | 11 refills | Status: DC

## 2017-09-02 MED ORDER — FLUTICASONE-UMECLIDIN-VILANT 100-62.5-25 MCG/INH IN AEPB: 1.0000 | INHALATION_SPRAY | Freq: Every day | RESPIRATORY_TRACT | 0 refills | Status: DC

## 2017-09-02 NOTE — Telephone Encounter (Signed)
Pt requesting coupon and sample of Trelegy to be brought to [facility] office on Thursday.  Sample and copay assistance have been left in triage to be brought to [facility] on Thursday.    Forwarding to Corrine to make aware, as she is travelling to [facility] that day.

## 2017-09-03 NOTE — Telephone Encounter (Signed)
Samples taken to [facility] facility. Patient aware. Nothing further needed.

## 2017-09-04 ENCOUNTER — Encounter: Payer: Self-pay | Admitting: Pulmonary Disease

## 2017-09-04 ENCOUNTER — Ambulatory Visit (INDEPENDENT_AMBULATORY_CARE_PROVIDER_SITE_OTHER): Payer: Medicare Other | Admitting: Pulmonary Disease

## 2017-09-04 VITALS — BP 100/60 | HR 68 | Wt 81.0 lb

## 2017-09-04 DIAGNOSIS — I2510 Atherosclerotic heart disease of native coronary artery without angina pectoris: Secondary | ICD-10-CM

## 2017-09-04 DIAGNOSIS — J449 Chronic obstructive pulmonary disease, unspecified: Secondary | ICD-10-CM | POA: Diagnosis not present

## 2017-09-04 DIAGNOSIS — E43 Unspecified severe protein-calorie malnutrition: Secondary | ICD-10-CM

## 2017-09-04 DIAGNOSIS — J9610 Chronic respiratory failure, unspecified whether with hypoxia or hypercapnia: Secondary | ICD-10-CM

## 2017-09-04 MED ORDER — PREDNISONE 10 MG PO TABS: 10.0000 mg | ORAL_TABLET | Freq: Every day | ORAL | 2 refills | Status: DC

## 2017-09-04 MED FILL — predniSONE 10 MG TABS: 10 | 20 days supply | Qty: 20 | Fill #0

## 2017-09-04 NOTE — Patient Instructions (Signed)
Severe COPD: Continue Trelegy 1 puff daily no matter how you feel Continue albuterol as needed Get a high-dose flu shot in the fall Practice good hand hygiene Stay active  Osteoporosis: Have your lab work checked today as directed by Dr. Layne Benton.  I am happy to order those labs if needed  Chronic respiratory failure with hypoxemia: We are happy to prescribe an order for a portable oxygen concentrator when you are ready  We will see you back in October 2019 or sooner if needed
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
## 2017-09-05 ENCOUNTER — Telehealth: Payer: Self-pay | Admitting: Pulmonary Disease

## 2017-09-09 NOTE — Telephone Encounter (Signed)
Encounter opened by error.

## 2017-09-15 DIAGNOSIS — L404 Guttate psoriasis: Secondary | ICD-10-CM | POA: Diagnosis not present

## 2017-09-15 DIAGNOSIS — E43 Unspecified severe protein-calorie malnutrition: Secondary | ICD-10-CM | POA: Diagnosis not present

## 2017-09-15 DIAGNOSIS — Z79899 Other long term (current) drug therapy: Secondary | ICD-10-CM | POA: Diagnosis not present

## 2017-09-15 DIAGNOSIS — J449 Chronic obstructive pulmonary disease, unspecified: Secondary | ICD-10-CM | POA: Diagnosis not present

## 2017-09-15 DIAGNOSIS — J9611 Chronic respiratory failure with hypoxia: Secondary | ICD-10-CM | POA: Diagnosis not present

## 2017-09-15 DIAGNOSIS — E559 Vitamin D deficiency, unspecified: Secondary | ICD-10-CM | POA: Diagnosis not present

## 2017-09-15 DIAGNOSIS — M810 Age-related osteoporosis without current pathological fracture: Secondary | ICD-10-CM | POA: Diagnosis not present

## 2017-09-29 ENCOUNTER — Other Ambulatory Visit: Payer: Self-pay | Admitting: Pulmonary Disease

## 2017-09-30 ENCOUNTER — Telehealth: Payer: Self-pay | Admitting: Pulmonary Disease

## 2017-09-30 MED ORDER — FLUTICASONE-UMECLIDIN-VILANT 100-62.5-25 MCG/INH IN AEPB: 1.0000 | INHALATION_SPRAY | Freq: Every day | RESPIRATORY_TRACT | 0 refills | Status: DC

## 2017-09-30 NOTE — Telephone Encounter (Signed)
Spoke with patient, requests 90 day supply of Trelegy be sent to pharmacy. Pharmacy confirmed. Patient aware prescription has been sent. Nothing further needed at this time.

## 2017-10-02 MED FILL — predniSONE 10 MG TABS: 10 | 20 days supply | Qty: 20 | Fill #1

## 2017-10-06 ENCOUNTER — Other Ambulatory Visit: Payer: Self-pay | Admitting: Cardiovascular Disease
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
## 2017-10-06 NOTE — Telephone Encounter (Signed)
Rx for Carvedilol 25 BIC cancelled. Called pt who stated she is on Carvedilol 12.5 mg BID. She states she will not need this refilled until the end of the month. Pt verbalized thanks for the call.

## 2017-10-08 ENCOUNTER — Ambulatory Visit (INDEPENDENT_AMBULATORY_CARE_PROVIDER_SITE_OTHER): Payer: Medicare Other | Admitting: Cardiology

## 2017-10-08 ENCOUNTER — Encounter: Payer: Self-pay | Admitting: Cardiology

## 2017-10-08 VITALS — BP 134/68 | HR 59 | Ht 64.0 in | Wt 81.8 lb

## 2017-10-08 DIAGNOSIS — I10 Essential (primary) hypertension: Secondary | ICD-10-CM | POA: Diagnosis not present

## 2017-10-08 DIAGNOSIS — E7800 Pure hypercholesterolemia, unspecified: Secondary | ICD-10-CM | POA: Diagnosis not present

## 2017-10-08 DIAGNOSIS — I714 Abdominal aortic aneurysm, without rupture, unspecified: Secondary | ICD-10-CM

## 2017-10-08 DIAGNOSIS — I2510 Atherosclerotic heart disease of native coronary artery without angina pectoris: Secondary | ICD-10-CM

## 2017-10-08 NOTE — Patient Instructions (Addendum)
Medication Instructions: Your physician recommends that you continue on your current medications as directed.    If you need a refill on your cardiac medications before your next appointment, please call your pharmacy.   Labwork: None  Procedures/Testing: Your physician has requested that you have an abdominal aorta duplex in November . During this test, an ultrasound is used to evaluate the aorta. Allow 30 minutes for this exam. Do not eat after midnight the day before and avoid carbonated beverages [facility]- First floor  Follow-Up: Your physician wants you to follow-up in 6 month with Dr. Stanford Breed. You will receive a reminder letter in the mail two months in advance. If you don't receive a letter, please call our office at [phone] to schedule this follow-up appointment.   Special Instructions:    Thank you for choosing [facility] at [facility]!!

## 2017-10-24 DIAGNOSIS — E559 Vitamin D deficiency, unspecified: Secondary | ICD-10-CM | POA: Diagnosis not present

## 2017-10-24 DIAGNOSIS — R5383 Other fatigue: Secondary | ICD-10-CM | POA: Diagnosis not present

## 2017-10-24 DIAGNOSIS — M810 Age-related osteoporosis without current pathological fracture: Secondary | ICD-10-CM | POA: Diagnosis not present

## 2017-10-28 DIAGNOSIS — M810 Age-related osteoporosis without current pathological fracture: Secondary | ICD-10-CM | POA: Diagnosis not present

## 2017-10-28 DIAGNOSIS — E559 Vitamin D deficiency, unspecified: Secondary | ICD-10-CM | POA: Diagnosis not present

## 2017-11-06 MED FILL — predniSONE 10 MG TABS: 10 | 20 days supply | Qty: 20 | Fill #2

## 2017-11-10 ENCOUNTER — Ambulatory Visit (INDEPENDENT_AMBULATORY_CARE_PROVIDER_SITE_OTHER): Payer: Medicare Other | Admitting: Pulmonary Disease

## 2017-11-10 ENCOUNTER — Encounter: Payer: Self-pay | Admitting: Pulmonary Disease

## 2017-11-10 VITALS — BP 105/60 | HR 68 | Ht 64.0 in | Wt <= 1120 oz

## 2017-11-10 DIAGNOSIS — I2510 Atherosclerotic heart disease of native coronary artery without angina pectoris: Secondary | ICD-10-CM

## 2017-11-10 DIAGNOSIS — J9610 Chronic respiratory failure, unspecified whether with hypoxia or hypercapnia: Secondary | ICD-10-CM

## 2017-11-10 DIAGNOSIS — Z23 Encounter for immunization: Secondary | ICD-10-CM | POA: Diagnosis not present

## 2017-11-10 DIAGNOSIS — J449 Chronic obstructive pulmonary disease, unspecified: Secondary | ICD-10-CM | POA: Diagnosis not present

## 2017-11-10 MED ORDER — PREDNISONE 10 MG PO TABS: 10.0000 mg | ORAL_TABLET | Freq: Every day | ORAL | 2 refills | Status: DC
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
## 2017-11-10 NOTE — Patient Instructions (Signed)
Chronic respiratory failure with hypoxemia: Please let us know if you would like for [place] to arrange for a portable oxygen concentrator  COPD: Continue Trelegy 1 puff daily no matter how you feel Continue albuterol as needed High-dose flu shot today Practice good hand hygiene Stay active Continue using prednisone sparingly as needed

## 2017-11-29 ENCOUNTER — Other Ambulatory Visit: Payer: Self-pay | Admitting: Adult Health

## 2017-12-04 DIAGNOSIS — H524 Presbyopia: Secondary | ICD-10-CM | POA: Diagnosis not present

## 2017-12-04 DIAGNOSIS — H5319 Other subjective visual disturbances: Secondary | ICD-10-CM | POA: Diagnosis not present

## 2017-12-04 DIAGNOSIS — H35373 Puckering of macula, bilateral: Secondary | ICD-10-CM | POA: Diagnosis not present

## 2017-12-04 DIAGNOSIS — Z961 Presence of intraocular lens: Secondary | ICD-10-CM | POA: Diagnosis not present

## 2017-12-05 ENCOUNTER — Ambulatory Visit (HOSPITAL_BASED_OUTPATIENT_CLINIC_OR_DEPARTMENT_OTHER)
Admission: RE | Admit: 2017-12-05 | Discharge: 2017-12-05 | Disposition: A | Discharge status: Home / Self Care | Payer: Medicare Other | Source: Ambulatory Visit | Attending: Cardiology | Admitting: Cardiology

## 2017-12-05 DIAGNOSIS — I714 Abdominal aortic aneurysm, without rupture, unspecified: Secondary | ICD-10-CM

## 2017-12-05 NOTE — Progress Notes (Signed)
Abdominal Aorta Duplex performed     Summary: Abdominal Aorta: There is evidence of abnormal dilitation of the Mid Abdominal aorta. The largest aortic measurement is 3.3 cm.  Stenosis:  Location Stenosis  Distal Aorta Near complete Occlusion  Right Common Iliac Near complete Occlusion  Left Common Iliac Near complete Occlusion  Right External Iliac >50% stenosis   Left External Iliac >50% stenosis       Distal abdomial Aorta is near complete occlusion. Proxiaml left and right Iliac artries are near complete occlusion.    12/05/17  Cardell Peach RDCS, RVT

## 2017-12-10 ENCOUNTER — Telehealth: Payer: Self-pay

## 2017-12-10 NOTE — Telephone Encounter (Signed)
Pt aware of AAAduplex results and Dr.Crenshaw's recommendation with verbal understanding. appt scheduled with Dr.Berry on 12/16/17 @ 10am. Pt aware of appt.

## 2017-12-16 ENCOUNTER — Ambulatory Visit (INDEPENDENT_AMBULATORY_CARE_PROVIDER_SITE_OTHER): Payer: Medicare Other | Admitting: Cardiovascular Disease

## 2017-12-16 ENCOUNTER — Encounter: Payer: Self-pay | Admitting: Cardiovascular Disease

## 2017-12-16 VITALS — BP 118/68 | HR 68 | Ht 64.0 in | Wt 82.6 lb

## 2017-12-16 DIAGNOSIS — I739 Peripheral vascular disease, unspecified: Secondary | ICD-10-CM

## 2017-12-16 DIAGNOSIS — I2510 Atherosclerotic heart disease of native coronary artery without angina pectoris: Secondary | ICD-10-CM

## 2017-12-16 DIAGNOSIS — Z01812 Encounter for preprocedural laboratory examination: Secondary | ICD-10-CM | POA: Diagnosis not present

## 2017-12-16 DIAGNOSIS — R5383 Other fatigue: Secondary | ICD-10-CM

## 2017-12-16 LAB — BASIC METABOLIC PANEL
BUN/Creatinine Ratio: 18 (ref 12–28)
BUN: 17 mg/dL (ref 8–27)
CO2: 25 mmol/L (ref 20–29)
Calcium: 10.1 mg/dL (ref 8.7–10.3)
Chloride: 100 mmol/L (ref 96–106)
Creatinine, Ser: 0.95 mg/dL (ref 0.57–1.00)
GFR calc Af Amer: 70 mL/min/{1.73_m2} (ref >59)
GFR calc non Af Amer: 61 mL/min/{1.73_m2} (ref >59)
Glucose: 88 mg/dL (ref 65–99)
Potassium: 4.4 mmol/L (ref 3.5–5.2)
Sodium: 141 mmol/L (ref 134–144)

## 2017-12-16 LAB — CBC WITH DIFFERENTIAL/PLATELET
Basophils Absolute: 0 10*3/uL (ref 0.0–0.2)
Basos: 0 %
EOS (ABSOLUTE): 0.1 10*3/uL (ref 0.0–0.4)
Eos: 1 %
Hematocrit: 34.7 % (ref 34.0–46.6)
Hemoglobin: 11.1 g/dL (ref 11.1–15.9)
Immature Grans (Abs): 0 10*3/uL (ref 0.0–0.1)
Immature Granulocytes: 0 %
Lymphocytes Absolute: 1.8 10*3/uL (ref 0.7–3.1)
Lymphs: 18 %
MCH: 28 pg (ref 26.6–33.0)
MCHC: 32 g/dL (ref 31.5–35.7)
MCV: 88 fL (ref 79–97)
Monocytes Absolute: 0.2 10*3/uL (ref 0.1–0.9)
Monocytes: 2 %
Neutrophils Absolute: 7.7 10*3/uL — ABNORMAL HIGH (ref 1.4–7.0)
Neutrophils: 79 %
Platelets: 232 10*3/uL (ref 150–450)
RBC: 3.96 x10E6/uL (ref 3.77–5.28)
RDW: 11.8 % — ABNORMAL LOW (ref 12.3–15.4)
WBC: 9.8 10*3/uL (ref 3.4–10.8)

## 2017-12-16 LAB — TSH: TSH: 0.958 u[IU]/mL (ref 0.450–4.500)

## 2017-12-16 NOTE — Progress Notes (Signed)
12/16/2017 Tonya Harper   January 25, 1946  [id number]  Primary Physician Tonya Harper, Tonya Cabal, MD Primary Cardiologist: Tonya Harp MD Tonya Harper, [place]  HPI:  Tonya Harper is a 71 y.o.  thin-appearing divorced African-American female mother of 2, her mother for grandchildren referred by Dr. Stanford Breed for evaluation of symptomatic PAD. I last saw her in the office  07/09/2016 Her primary care provider is Dr. Coralyn Harper. She has a history of hypertension and hyperlipidemia. She smokes requires a pack a day up until 2013 (30-40 pack years). She does have a history of CAD and mild to moderate LV dysfunction. She has a small to moderate size infrarenal abdominal aortic aneurysm demonstrated by Dopplers October 2017 measuring 2.4 x 3.9 cm. She can walk only 20-30 feet and had recent Dopplers performed 12/11/15 revealing ABIs in the 0.6 range bilaterally with iliac, common femoral and superficial femoral artery disease. She wishedto initially pursue a pharmacologic approach and if unsuccessful a more aggressive approach. I began her on Pletal which did afford her some benefit. Since I saw her 3 months ago she continues to enjoy the benefits of pharmacologic therapy and wishes to put off an invasive approach at this time.  Since I saw her a year and a half ago her claudication has gotten progressively worse.  Her distal aorta is subocclusive as are both iliac arteries.  She has SFA disease as well.  She wishes not to pursue an invasive approach to define her anatomy and potential provide endovascular therapy for lifestyle limiting claudication.   Current Meds  Medication Sig  . acetaminophen (TYLENOL) 325 MG tablet Take 650 mg by mouth every 6 (six) hours as needed for mild pain or moderate pain. For pain  . albuterol (PROVENTIL HFA;VENTOLIN HFA) 108 (90 Base) MCG/ACT inhaler TAKE 2 PUFFS BY MOUTH EVERY 6 HOURS AS NEEDED FOR WHEEZE  . albuterol (PROVENTIL) (2.5 MG/3ML) 0.083% nebulizer  solution USE 1 AMPULE IN NEBULIZER EVERY 6 HOURS AS NEEDED FOR WHEEZING/SHORTNESS OF BREATH  . aspirin EC 81 MG tablet Take 1 tablet (81 mg total) by mouth daily.  . calcium gluconate 500 MG tablet Take 1 tablet by mouth 2 (two) times daily.  . carvedilol (COREG) 12.5 MG tablet Take 1 tablet (12.5 mg total) by mouth 2 (two) times daily with a meal.  . cilostazol (PLETAL) 50 MG tablet TAKE 1 TABLET (50 MG TOTAL) BY MOUTH 2 (TWO) TIMES DAILY.  . clotrimazole (LOTRIMIN) 1 % cream Apply 1 application topically 4 (four) times daily as needed.  . Fluticasone-Umeclidin-Vilant (TRELEGY ELLIPTA) 100-62.5-25 MCG/INH AEPB Inhale 1 puff into the lungs daily.  . lansoprazole (PREVACID) 30 MG capsule Take by mouth.  . levothyroxine (SYNTHROID, LEVOTHROID) 25 MCG tablet Take 1 tablet by mouth daily.  . nitroGLYCERIN (NITROSTAT) 0.4 MG SL tablet Place 1 tablet (0.4 mg total) under the tongue every 5 (five) minutes as needed for chest pain (X3 DOSES BEFORE CALLING 911).  . pravastatin (PRAVACHOL) 20 MG tablet TAKE 1 TABLET BY MOUTH EVERYDAY AT BEDTIME  . predniSONE (DELTASONE) 10 MG tablet Take 1 tablet (10 mg total) by mouth daily with breakfast.  . TRELEGY ELLIPTA 100-62.5-25 MCG/INH AEPB TAKE 1 PUFF BY MOUTH EVERY DAY  . UNABLE TO FIND Inhale into the lungs daily as needed (shortness of breath). (Oxygen) 2-3 liters  . Vitamin D, Ergocalciferol, 2000 units CAPS Take 1 capsule by mouth daily.     Allergies  Allergen Reactions  . Aspirin Swelling  nausea and dizziness after taking for one month at a time.. States her physician told her to use the 81 mg vs the 325mg . Other reaction(s): Dizziness (intolerance) nausea and dizziness after taking for one month at a time.. States her physician told her to use the 81 mg vs the 325mg .  . Pneumovax [Pneumococcal Polysaccharide Vaccine] Hives and Swelling  . Shellfish Allergy Swelling and Anaphylaxis    Medication withdrawal symptoms Medication withdrawal  symptoms Medication withdrawal symptoms   . Tdap [Tetanus-Diphth-Acell Pertussis] Swelling and Rash    Social History   Socioeconomic History  . Marital status: Married    Spouse name: Not on file  . Number of children: 2  . Years of education: Not on file  . Highest education level: Not on file  Occupational History  . Occupation: unemployed    Comment: Retired  Scientific laboratory technician  . Financial resource strain: Not on file  . Food insecurity:    Worry: Not on file    Inability: Not on file  . Transportation needs:    Medical: Not on file    Non-medical: Not on file  Tobacco Use  . Smoking status: Former Smoker    Packs/day: 0.50    Years: 40.00    Pack years: 20.00    Types: Cigarettes    Last attempt to quit: 02/14/2011    Years since quitting: 6.8  . Smokeless tobacco: Never Used  Substance and Sexual Activity  . Alcohol use: Yes    Alcohol/week: 0.0 standard drinks    Comment: occ  . Drug use: No    Types: Methylphenidate    Comment: denies uses 10/10/14  . Sexual activity: Yes    Birth control/protection: Surgical  Lifestyle  . Physical activity:    Days per week: Not on file    Minutes per session: Not on file  . Stress: Not on file  Relationships  . Social connections:    Talks on phone: Not on file    Gets together: Not on file    Attends religious service: Not on file    Active member of club or organization: Not on file    Attends meetings of clubs or organizations: Not on file    Relationship status: Not on file  . Intimate partner violence:    Fear of current or ex partner: Not on file    Emotionally abused: Not on file    Physically abused: Not on file    Forced sexual activity: Not on file  Other Topics Concern  . Not on file  Social History Narrative  . Not on file     Review of Systems: General: negative for chills, fever, night sweats or weight changes.  Cardiovascular: negative for chest pain, dyspnea on exertion, edema, orthopnea,  palpitations, paroxysmal nocturnal dyspnea or shortness of breath Dermatological: negative for rash Respiratory: negative for cough or wheezing Urologic: negative for hematuria Abdominal: negative for nausea, vomiting, diarrhea, bright red blood per rectum, melena, or hematemesis Neurologic: negative for visual changes, syncope, or dizziness All other systems reviewed and are otherwise negative except as noted above.    Blood pressure 118/68, pulse 68, height 5\' 4"  (1.626 m), weight 82 lb 9.6 oz (37.5 kg), SpO2 (!) 86 %.  General appearance: alert and no distress Neck: no adenopathy, no carotid bruit, no JVD, supple, symmetrical, trachea midline and thyroid not enlarged, symmetric, no tenderness/mass/nodules Lungs: clear to auscultation bilaterally Heart: regular rate and rhythm, S1, S2 normal, no murmur, click, rub  or gallop Extremities: extremities normal, atraumatic, no cyanosis or edema Pulses: Diminished pedal pulses bilaterally Skin: Skin color, texture, turgor normal. No rashes or lesions Neurologic: Alert and oriented X 3, normal strength and tone. Normal symmetric reflexes. Normal coordination and gait  EKG not performed today  ASSESSMENT AND PLAN:   Peripheral arterial disease ([place]) Ms. Errickson was sent back to me again by Dr. Stanford Breed for aggressive claudication.  I had seen her a year and a half ago.  She does have a moderate size infrarenal abdominal aortic aneurysm with ABIs in the 0.6 range and now high-grade subocclusive disease in her distal aorta, and bilateral iliac arteries as well as superficial femoral arteries.  She has progressive lifestyle limiting claudication and wishes to pursue an invasive approach.      Tonya Harp MD FACP,FACC,FAHA, [facility] 12/16/2017 10:44 AM

## 2017-12-16 NOTE — Assessment & Plan Note (Signed)
Tonya Harper was sent back to me again by Dr. Stanford Breed for aggressive claudication.  I had seen her a year and a half ago.  She does have a moderate size infrarenal abdominal aortic aneurysm with ABIs in the 0.6 range and now high-grade subocclusive disease in her distal aorta, and bilateral iliac arteries as well as superficial femoral arteries.  She has progressive lifestyle limiting claudication and wishes to pursue an invasive approach.

## 2017-12-16 NOTE — Patient Instructions (Signed)
Medication Instructions:  Your physician recommends that you continue on your current medications as directed. Please refer to the Current Medication list given to you today.  If you need a refill on your cardiac medications before your next appointment, please call your pharmacy.   Lab work: Your physician recommends that you return for lab work in: TODAY  If you have labs (blood work) drawn today and your tests are completely normal, you will receive your results only by: Marland Kitchen MyChart Message (if you have MyChart) OR . A paper copy in the mail If you have any lab test that is abnormal or we need to change your treatment, we will call you to review the results.  Testing/Procedures: Your physician has requested that you have a lower extremity arterial doppler- During this test, ultrasound is used to evaluate arterial blood flow in the legs. Allow approximately one hour for this exam.  Your physician has requested that you have an ankle brachial index (ABI). During this test an ultrasound and blood pressure cuff are used to evaluate the arteries that supply the arms and legs with blood. Allow thirty minutes for this exam. There are no restrictions or special instructions.  SCHEDULE FOR 1 WEEK AFTER 01/05/2018   Follow-Up: At [facility], you and your health needs are our priority.  As part of our continuing mission to provide you with exceptional heart care, we have created designated Provider Care Teams.  These Care Teams include your primary Cardiologist (physician) and Advanced Practice Providers (APPs -  Physician Assistants and Nurse Practitioners) who all work together to provide you with the care you need, when you need it. You will need a follow up appointment in 2 WEEKS with Dr. Gwenlyn Found from 01/05/2018. Please call our office 2 months in advance to schedule this appointment.  You may see Quay Burow, MD or one of the following Advanced Practice Providers on your designated Care Team:    Kerin Ransom, PA-C Roby Lofts, [place] . Sande Rives, PA-C  Any Other Special Instructions Will Be Listed Below (If Applicable).

## 2017-12-18 ENCOUNTER — Encounter: Payer: Self-pay | Admitting: Cardiovascular Disease

## 2017-12-29 ENCOUNTER — Telehealth: Payer: Self-pay | Admitting: Cardiovascular Disease

## 2017-12-29 NOTE — Telephone Encounter (Signed)
Pt has procedure Monday 12-9. She has a cold, coughing up green mucus. Wants to know if we can prescribe something to help get rid of it so she doesn't have to reschedule. Uses [facility]. Pls call [phone]

## 2017-12-29 NOTE — Telephone Encounter (Signed)
Called patient, advised that she should call her PCP office and advise that she is having issues, and they may could give her something. Patient verbalized understanding.

## 2017-12-30 ENCOUNTER — Emergency Department (HOSPITAL_COMMUNITY): Payer: Medicare Other

## 2017-12-30 ENCOUNTER — Inpatient Hospital Stay (HOSPITAL_COMMUNITY)
Admission: EM | Admit: 2017-12-30 | Discharge: 2018-01-01 | DRG: 190 | Disposition: A | Discharge status: Home Health | Payer: Medicare Other | Attending: Family Medicine | Admitting: Family Medicine

## 2017-12-30 ENCOUNTER — Other Ambulatory Visit: Payer: Self-pay

## 2017-12-30 ENCOUNTER — Encounter (HOSPITAL_COMMUNITY): Payer: Self-pay | Admitting: Emergency Medicine

## 2017-12-30 DIAGNOSIS — H9190 Unspecified hearing loss, unspecified ear: Secondary | ICD-10-CM | POA: Diagnosis present

## 2017-12-30 DIAGNOSIS — K219 Gastro-esophageal reflux disease without esophagitis: Secondary | ICD-10-CM | POA: Diagnosis present

## 2017-12-30 DIAGNOSIS — Z8719 Personal history of other diseases of the digestive system: Secondary | ICD-10-CM

## 2017-12-30 DIAGNOSIS — I5022 Chronic systolic (congestive) heart failure: Secondary | ICD-10-CM | POA: Diagnosis present

## 2017-12-30 DIAGNOSIS — Z91013 Allergy to seafood: Secondary | ICD-10-CM | POA: Diagnosis not present

## 2017-12-30 DIAGNOSIS — Z841 Family history of disorders of kidney and ureter: Secondary | ICD-10-CM

## 2017-12-30 DIAGNOSIS — Z887 Allergy status to serum and vaccine status: Secondary | ICD-10-CM | POA: Diagnosis not present

## 2017-12-30 DIAGNOSIS — R062 Wheezing: Secondary | ICD-10-CM | POA: Diagnosis not present

## 2017-12-30 DIAGNOSIS — R531 Weakness: Secondary | ICD-10-CM | POA: Diagnosis not present

## 2017-12-30 DIAGNOSIS — I252 Old myocardial infarction: Secondary | ICD-10-CM

## 2017-12-30 DIAGNOSIS — E7800 Pure hypercholesterolemia, unspecified: Secondary | ICD-10-CM | POA: Diagnosis present

## 2017-12-30 DIAGNOSIS — R0602 Shortness of breath: Secondary | ICD-10-CM | POA: Diagnosis not present

## 2017-12-30 DIAGNOSIS — I739 Peripheral vascular disease, unspecified: Secondary | ICD-10-CM | POA: Diagnosis present

## 2017-12-30 DIAGNOSIS — E890 Postprocedural hypothyroidism: Secondary | ICD-10-CM | POA: Diagnosis present

## 2017-12-30 DIAGNOSIS — E785 Hyperlipidemia, unspecified: Secondary | ICD-10-CM | POA: Diagnosis present

## 2017-12-30 DIAGNOSIS — R0902 Hypoxemia: Secondary | ICD-10-CM | POA: Diagnosis not present

## 2017-12-30 DIAGNOSIS — J449 Chronic obstructive pulmonary disease, unspecified: Secondary | ICD-10-CM | POA: Diagnosis not present

## 2017-12-30 DIAGNOSIS — I110 Hypertensive heart disease with heart failure: Secondary | ICD-10-CM | POA: Diagnosis present

## 2017-12-30 DIAGNOSIS — J9611 Chronic respiratory failure with hypoxia: Secondary | ICD-10-CM | POA: Diagnosis present

## 2017-12-30 DIAGNOSIS — I25119 Atherosclerotic heart disease of native coronary artery with unspecified angina pectoris: Secondary | ICD-10-CM | POA: Diagnosis present

## 2017-12-30 DIAGNOSIS — E860 Dehydration: Secondary | ICD-10-CM | POA: Diagnosis present

## 2017-12-30 DIAGNOSIS — K222 Esophageal obstruction: Secondary | ICD-10-CM | POA: Diagnosis present

## 2017-12-30 DIAGNOSIS — I499 Cardiac arrhythmia, unspecified: Secondary | ICD-10-CM | POA: Diagnosis not present

## 2017-12-30 DIAGNOSIS — E43 Unspecified severe protein-calorie malnutrition: Secondary | ICD-10-CM | POA: Diagnosis present

## 2017-12-30 DIAGNOSIS — I2510 Atherosclerotic heart disease of native coronary artery without angina pectoris: Secondary | ICD-10-CM | POA: Diagnosis present

## 2017-12-30 DIAGNOSIS — I255 Ischemic cardiomyopathy: Secondary | ICD-10-CM | POA: Diagnosis present

## 2017-12-30 DIAGNOSIS — I340 Nonrheumatic mitral (valve) insufficiency: Secondary | ICD-10-CM | POA: Diagnosis present

## 2017-12-30 DIAGNOSIS — R634 Abnormal weight loss: Secondary | ICD-10-CM | POA: Diagnosis present

## 2017-12-30 DIAGNOSIS — Z886 Allergy status to analgesic agent status: Secondary | ICD-10-CM | POA: Diagnosis not present

## 2017-12-30 DIAGNOSIS — J441 Chronic obstructive pulmonary disease with (acute) exacerbation: Secondary | ICD-10-CM | POA: Diagnosis present

## 2017-12-30 DIAGNOSIS — Z79899 Other long term (current) drug therapy: Secondary | ICD-10-CM

## 2017-12-30 DIAGNOSIS — Z681 Body mass index (BMI) 19 or less, adult: Secondary | ICD-10-CM | POA: Diagnosis not present

## 2017-12-30 DIAGNOSIS — I714 Abdominal aortic aneurysm, without rupture: Secondary | ICD-10-CM | POA: Diagnosis present

## 2017-12-30 DIAGNOSIS — Z7952 Long term (current) use of systemic steroids: Secondary | ICD-10-CM

## 2017-12-30 DIAGNOSIS — Z79890 Hormone replacement therapy (postmenopausal): Secondary | ICD-10-CM

## 2017-12-30 DIAGNOSIS — Z9981 Dependence on supplemental oxygen: Secondary | ICD-10-CM

## 2017-12-30 DIAGNOSIS — R918 Other nonspecific abnormal finding of lung field: Secondary | ICD-10-CM | POA: Diagnosis not present

## 2017-12-30 DIAGNOSIS — Z90710 Acquired absence of both cervix and uterus: Secondary | ICD-10-CM

## 2017-12-30 DIAGNOSIS — I5032 Chronic diastolic (congestive) heart failure: Secondary | ICD-10-CM | POA: Diagnosis present

## 2017-12-30 DIAGNOSIS — Z8249 Family history of ischemic heart disease and other diseases of the circulatory system: Secondary | ICD-10-CM

## 2017-12-30 DIAGNOSIS — Z7982 Long term (current) use of aspirin: Secondary | ICD-10-CM

## 2017-12-30 DIAGNOSIS — N179 Acute kidney failure, unspecified: Secondary | ICD-10-CM | POA: Diagnosis present

## 2017-12-30 DIAGNOSIS — J9610 Chronic respiratory failure, unspecified whether with hypoxia or hypercapnia: Secondary | ICD-10-CM | POA: Diagnosis present

## 2017-12-30 DIAGNOSIS — Z87891 Personal history of nicotine dependence: Secondary | ICD-10-CM

## 2017-12-30 DIAGNOSIS — R0989 Other specified symptoms and signs involving the circulatory and respiratory systems: Secondary | ICD-10-CM | POA: Diagnosis not present

## 2017-12-30 DIAGNOSIS — I10 Essential (primary) hypertension: Secondary | ICD-10-CM | POA: Diagnosis present

## 2017-12-30 DIAGNOSIS — Z823 Family history of stroke: Secondary | ICD-10-CM

## 2017-12-30 DIAGNOSIS — R05 Cough: Secondary | ICD-10-CM | POA: Diagnosis not present

## 2017-12-30 DIAGNOSIS — Z8042 Family history of malignant neoplasm of prostate: Secondary | ICD-10-CM

## 2017-12-30 HISTORY — DX: Dyspnea, unspecified: R06.00

## 2017-12-30 LAB — CBC WITH DIFFERENTIAL/PLATELET
Abs Immature Granulocytes: 0.04 10*3/uL (ref 0.00–0.07)
Basophils Absolute: 0 10*3/uL (ref 0.0–0.1)
Basophils Relative: 0 %
Eosinophils Absolute: 0 10*3/uL (ref 0.0–0.5)
Eosinophils Relative: 0 %
HCT: 36.7 % (ref 36.0–46.0)
Hemoglobin: 11.3 g/dL — ABNORMAL LOW (ref 12.0–15.0)
Immature Granulocytes: 0 %
Lymphocytes Relative: 12 %
Lymphs Abs: 1.5 10*3/uL (ref 0.7–4.0)
MCH: 28.3 pg (ref 26.0–34.0)
MCHC: 30.8 g/dL (ref 30.0–36.0)
MCV: 91.8 fL (ref 80.0–100.0)
Monocytes Absolute: 0.3 10*3/uL (ref 0.1–1.0)
Monocytes Relative: 3 %
Neutro Abs: 11.1 10*3/uL — ABNORMAL HIGH (ref 1.7–7.7)
Neutrophils Relative %: 85 %
Platelets: 291 10*3/uL (ref 150–400)
RBC: 4 MIL/uL (ref 3.87–5.11)
RDW: 13.6 % (ref 11.5–15.5)
WBC: 13.1 10*3/uL — ABNORMAL HIGH (ref 4.0–10.5)
nRBC: 0 % (ref 0.0–0.2)

## 2017-12-30 LAB — COMPREHENSIVE METABOLIC PANEL
ALT: 13 U/L (ref 0–44)
AST: 21 U/L (ref 15–41)
Albumin: 3.5 g/dL (ref 3.5–5.0)
Alkaline Phosphatase: 24 U/L — ABNORMAL LOW (ref 38–126)
Anion gap: 9 (ref 5–15)
BUN: 13 mg/dL (ref 8–23)
CO2: 29 mmol/L (ref 22–32)
Calcium: 9.3 mg/dL (ref 8.9–10.3)
Chloride: 101 mmol/L (ref 98–111)
Creatinine, Ser: 1.09 mg/dL — ABNORMAL HIGH (ref 0.44–1.00)
GFR calc Af Amer: 60 mL/min — ABNORMAL LOW (ref >60)
GFR calc non Af Amer: 51 mL/min — ABNORMAL LOW (ref >60)
Glucose, Bld: 105 mg/dL — ABNORMAL HIGH (ref 70–99)
Potassium: 3.7 mmol/L (ref 3.5–5.1)
Sodium: 139 mmol/L (ref 135–145)
Total Bilirubin: 0.6 mg/dL (ref 0.3–1.2)
Total Protein: 7.6 g/dL (ref 6.5–8.1)

## 2017-12-30 LAB — INFLUENZA PANEL BY PCR (TYPE A & B)
Influenza A By PCR: NEGATIVE
Influenza B By PCR: NEGATIVE

## 2017-12-30 LAB — TROPONIN I: Troponin I: <0.03 ng/mL (ref <0.03)

## 2017-12-30 LAB — BRAIN NATRIURETIC PEPTIDE: B Natriuretic Peptide: 86.8 pg/mL (ref 0.0–100.0)

## 2017-12-30 LAB — D-DIMER, QUANTITATIVE: D-Dimer, Quant: 1.57 ug/mL-FEU — ABNORMAL HIGH (ref 0.00–0.50)

## 2017-12-30 MED ORDER — IPRATROPIUM-ALBUTEROL 0.5-2.5 (3) MG/3ML IN SOLN
3.0000 mL | Freq: Once | RESPIRATORY_TRACT | Status: AC
  Administered 2017-12-30: 3 mL via RESPIRATORY_TRACT
  Filled 2017-12-30: qty 3

## 2017-12-30 MED ORDER — GUAIFENESIN ER 600 MG PO TB12
600.0000 mg | ORAL_TABLET | Freq: Two times a day (BID) | ORAL | Status: DC
  Filled 2017-12-30: qty 1

## 2017-12-30 MED ORDER — ALBUTEROL SULFATE (2.5 MG/3ML) 0.083% IN NEBU
5.0000 mg | INHALATION_SOLUTION | Freq: Once | RESPIRATORY_TRACT | Status: AC
  Administered 2017-12-30: 5 mg via RESPIRATORY_TRACT
  Filled 2017-12-30: qty 6

## 2017-12-30 MED ORDER — ENOXAPARIN SODIUM 40 MG/0.4ML ~~LOC~~ SOLN
40.0000 mg | SUBCUTANEOUS | Status: DC
  Administered 2017-12-31 (×2): 40 mg via SUBCUTANEOUS
  Filled 2017-12-30 (×2): qty 0.4

## 2017-12-30 MED ORDER — CALCIUM CARBONATE 1250 (500 CA) MG PO TABS
1.0000 | ORAL_TABLET | Freq: Two times a day (BID) | ORAL | Status: DC
  Administered 2017-12-30 – 2018-01-01 (×3): 500 mg via ORAL
  Filled 2017-12-30 (×5): qty 1

## 2017-12-30 MED ORDER — FLUTICASONE PROPIONATE HFA 110 MCG/ACT IN AERO
1.0000 | INHALATION_SPRAY | Freq: Every day | RESPIRATORY_TRACT | Status: DC
  Administered 2017-12-30 – 2018-01-01 (×3): 1 via RESPIRATORY_TRACT
  Filled 2017-12-30 (×2): qty 12

## 2017-12-30 MED ORDER — SODIUM CHLORIDE 0.9 % IV SOLN
500.0000 mg | INTRAVENOUS | Status: DC
  Administered 2017-12-30 – 2017-12-31 (×2): 500 mg via INTRAVENOUS
  Filled 2017-12-30 (×3): qty 500

## 2017-12-30 MED ORDER — PANTOPRAZOLE SODIUM 40 MG PO TBEC
40.0000 mg | DELAYED_RELEASE_TABLET | Freq: Every day | ORAL | Status: DC
  Administered 2017-12-30 – 2018-01-01 (×3): 40 mg via ORAL
  Filled 2017-12-30 (×3): qty 1

## 2017-12-30 MED ORDER — METHYLPREDNISOLONE SODIUM SUCC 125 MG IJ SOLR: 125.0000 mg | Freq: Once | INTRAMUSCULAR | Status: DC

## 2017-12-30 MED ORDER — UMECLIDINIUM-VILANTEROL 62.5-25 MCG/INH IN AEPB
1.0000 | INHALATION_SPRAY | Freq: Every day | RESPIRATORY_TRACT | Status: DC
  Administered 2017-12-31 – 2018-01-01 (×2): 1 via RESPIRATORY_TRACT
  Filled 2017-12-30 (×2): qty 14

## 2017-12-30 MED ORDER — LEVOTHYROXINE SODIUM 25 MCG PO TABS
25.0000 ug | ORAL_TABLET | ORAL | Status: DC
  Administered 2017-12-31 – 2018-01-01 (×2): 25 ug via ORAL
  Filled 2017-12-30 (×3): qty 1

## 2017-12-30 MED ORDER — VITAMIN D 25 MCG (1000 UNIT) PO TABS
2000.0000 [IU] | ORAL_TABLET | Freq: Every day | ORAL | Status: DC
  Administered 2017-12-31 – 2018-01-01 (×2): 2000 [IU] via ORAL
  Filled 2017-12-30 (×2): qty 2

## 2017-12-30 MED ORDER — ALBUTEROL SULFATE HFA 108 (90 BASE) MCG/ACT IN AERS: 2.0000 | INHALATION_SPRAY | RESPIRATORY_TRACT | Status: DC | PRN

## 2017-12-30 MED ORDER — ASPIRIN EC 81 MG PO TBEC
81.0000 mg | DELAYED_RELEASE_TABLET | Freq: Every day | ORAL | Status: DC
  Administered 2017-12-31 (×2): 81 mg via ORAL
  Filled 2017-12-30 (×2): qty 1

## 2017-12-30 MED ORDER — GUAIFENESIN 100 MG/5ML PO SOLN
15.0000 mL | Freq: Four times a day (QID) | ORAL | Status: DC
  Administered 2017-12-30 – 2018-01-01 (×7): 300 mg via ORAL
  Filled 2017-12-30: qty 15
  Filled 2017-12-30 (×2): qty 5
  Filled 2017-12-30 (×6): qty 15

## 2017-12-30 MED ORDER — PREDNISONE 20 MG PO TABS
40.0000 mg | ORAL_TABLET | Freq: Every day | ORAL | Status: DC
  Administered 2017-12-31 – 2018-01-01 (×2): 40 mg via ORAL
  Filled 2017-12-30 (×2): qty 2

## 2017-12-30 MED ORDER — SODIUM CHLORIDE 0.9 % IV SOLN
1.0000 g | INTRAVENOUS | Status: DC
  Administered 2017-12-30: 1 g via INTRAVENOUS
  Filled 2017-12-30: qty 10

## 2017-12-30 MED ORDER — CILOSTAZOL 50 MG PO TABS
50.0000 mg | ORAL_TABLET | Freq: Two times a day (BID) | ORAL | Status: DC
  Administered 2017-12-31 – 2018-01-01 (×4): 50 mg via ORAL
  Filled 2017-12-30 (×7): qty 1

## 2017-12-30 MED ORDER — VITAMIN D (ERGOCALCIFEROL) 50 MCG (2000 UT) PO CAPS: 2000.0000 [IU] | ORAL_CAPSULE | Freq: Every day | ORAL | Status: DC

## 2017-12-30 MED ORDER — ACETAMINOPHEN 325 MG PO TABS
650.0000 mg | ORAL_TABLET | Freq: Four times a day (QID) | ORAL | Status: DC | PRN
  Administered 2017-12-30 – 2018-01-01 (×2): 650 mg via ORAL
  Filled 2017-12-30 (×2): qty 2

## 2017-12-30 MED ORDER — FLUTICASONE-UMECLIDIN-VILANT 100-62.5-25 MCG/INH IN AEPB: 1.0000 | INHALATION_SPRAY | Freq: Every day | RESPIRATORY_TRACT | Status: DC

## 2017-12-30 MED ORDER — PRAVASTATIN SODIUM 10 MG PO TABS
20.0000 mg | ORAL_TABLET | Freq: Every day | ORAL | Status: DC
  Administered 2017-12-31 (×2): 20 mg via ORAL
  Filled 2017-12-30: qty 2
  Filled 2017-12-30: qty 1
  Filled 2017-12-30: qty 2

## 2017-12-30 MED ORDER — CARVEDILOL 12.5 MG PO TABS
12.5000 mg | ORAL_TABLET | Freq: Two times a day (BID) | ORAL | Status: DC
  Administered 2017-12-31 – 2018-01-01 (×3): 12.5 mg via ORAL
  Filled 2017-12-30 (×3): qty 1

## 2017-12-30 MED ORDER — IOPAMIDOL (ISOVUE-370) INJECTION 76%
100.0000 mL | Freq: Once | INTRAVENOUS | Status: AC | PRN
  Administered 2017-12-30: 100 mL via INTRAVENOUS

## 2017-12-30 MED ORDER — CALCIUM GLUCONATE 500 MG PO TABS
1.0000 | ORAL_TABLET | Freq: Two times a day (BID) | ORAL | Status: DC
  Filled 2017-12-30: qty 1

## 2017-12-30 MED ORDER — ALBUTEROL SULFATE (2.5 MG/3ML) 0.083% IN NEBU
2.5000 mg | INHALATION_SOLUTION | Freq: Four times a day (QID) | RESPIRATORY_TRACT | Status: DC | PRN
  Administered 2017-12-31 (×2): 2.5 mg via RESPIRATORY_TRACT
  Filled 2017-12-30 (×2): qty 3

## 2017-12-30 MED ORDER — NITROGLYCERIN 0.4 MG SL SUBL: 0.4000 mg | SUBLINGUAL_TABLET | SUBLINGUAL | Status: DC | PRN
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
## 2017-12-30 NOTE — ED Triage Notes (Signed)
Pt from dr office today , she had gone because she was tohave ? Femoral placed next week was nt feeling good , found to O2 sat in 80s   Given  80 mg solumedrol and albuteral , still not feeling well called ems and brought  Here, has little bit of a wheeze left, denies cp , has had cold and congestion laST COUPLE OF  Days, sob on exertion

## 2017-12-30 NOTE — ED Provider Notes (Signed)
Care assumed at 1600. Patient with history of COPD here for evaluation of  increased shortness of breath. She has tachypnea on examination with decreased air movement bilaterally. She is unable to ambulate even minimal distances. CTA is negative for PE but does demonstrate mucus plugging. Plan to admit for further treatment. Patient updated of findings of studies recommendation for admission and she is in agreement with treatment plan.   Quintella Reichert, MD 12/30/17 [phone]

## 2017-12-31 ENCOUNTER — Encounter (HOSPITAL_COMMUNITY): Payer: Self-pay | Admitting: General Practice

## 2017-12-31 LAB — RESPIRATORY PANEL BY PCR

## 2017-12-31 LAB — CBC
HCT: 34.9 % — ABNORMAL LOW (ref 36.0–46.0)
Hemoglobin: 11.3 g/dL — ABNORMAL LOW (ref 12.0–15.0)
MCH: 28.6 pg (ref 26.0–34.0)
MCHC: 32.4 g/dL (ref 30.0–36.0)
MCV: 88.4 fL (ref 80.0–100.0)
Platelets: 261 10*3/uL (ref 150–400)
RBC: 3.95 MIL/uL (ref 3.87–5.11)
RDW: 13.7 % (ref 11.5–15.5)
WBC: 24.9 10*3/uL — ABNORMAL HIGH (ref 4.0–10.5)
nRBC: 0 % (ref 0.0–0.2)

## 2017-12-31 LAB — BASIC METABOLIC PANEL
Anion gap: 12 (ref 5–15)
BUN: 14 mg/dL (ref 8–23)
CO2: 27 mmol/L (ref 22–32)
Calcium: 9.6 mg/dL (ref 8.9–10.3)
Chloride: 98 mmol/L (ref 98–111)
Creatinine, Ser: 0.86 mg/dL (ref 0.44–1.00)
GFR calc Af Amer: >60 mL/min (ref >60)
GFR calc non Af Amer: >60 mL/min (ref >60)
Glucose, Bld: 96 mg/dL (ref 70–99)
Potassium: 3.9 mmol/L (ref 3.5–5.1)
Sodium: 137 mmol/L (ref 135–145)

## 2017-12-31 LAB — HIV ANTIBODY (ROUTINE TESTING W REFLEX): HIV Screen 4th Generation wRfx: NONREACTIVE

## 2017-12-31 MED ORDER — ADULT MULTIVITAMIN W/MINERALS CH
1.0000 | ORAL_TABLET | Freq: Every day | ORAL | Status: DC
  Administered 2017-12-31 – 2018-01-01 (×2): 1 via ORAL
  Filled 2017-12-31 (×2): qty 1

## 2017-12-31 MED ORDER — ENSURE ENLIVE PO LIQD
237.0000 mL | Freq: Three times a day (TID) | ORAL | Status: DC
  Administered 2017-12-31 – 2018-01-01 (×3): 237 mL via ORAL

## 2017-12-31 MED ORDER — ENSURE ENLIVE PO LIQD
237.0000 mL | Freq: Two times a day (BID) | ORAL | Status: DC
  Administered 2017-12-31: 237 mL via ORAL

## 2017-12-31 NOTE — Progress Notes (Signed)
Occupational Therapy Evaluation Patient Details Name: Tonya Harper MRN: [id number] DOB: 01/24/1946 Today's Date: 12/31/2017    History of Present Illness Pt is a 72 y/o female admitted secondary to worsening SOB. Thought to be secondary to COPD exacerbation. Imgaing showed low probability for PE, but did show mucous plugging. PMH includes CHF, HTN, PAD, CAD, COPD, and ischemic cardiomyopathy.   Clinical Impression   PTA, pt modified independent with ADL and mobility. Pt states that she has become increasingly SOB with ADL and mobility just prior to recent admission. Pt states she is beginning to feel better. Pt able to walk her household distance to the bathroom with O2 desat to 88 on RA with 2/4 dyspnea. O2 returned to 91 quickly with pursed lip breathing and rest. Began education on energy conservation strategies. Educated pt on recommendation on use of rollator for energy conservation and pt is in agreement. Will follow acutely to facilitate safe DC home  With HHOT.  Pt asking about portable light weight O2 tanks.     Follow Up Recommendations  Supervision - Intermittent;Home health OT    Equipment Recommendations  3 in 1 bedside commode;Other (comment)(rollator)    Recommendations for Other Services       Precautions / Restrictions Precautions Precautions: Fall;Other (comment) Precaution Comments: watch O2 sats Restrictions Weight Bearing Restrictions: No      Mobility Bed Mobility Overal bed mobility: Modified Independent                Transfers Overall transfer level: Modified independent    Balance Overall balance assessment: Needs assistance Sitting-balance support: No upper extremity supported;Feet supported Sitting balance-Leahy Scale: Good     Standing balance support: No upper extremity supported;During functional activity Standing balance-Leahy Scale: Fair                             ADL either performed or assessed with clinical  judgement   ADL Overall ADL's : Needs assistance/impaired                                     Functional mobility during ADLs: Supervision/safety General ADL Comments: overall set up for ADL. At baseline pt uses Trilogy during the day; O2 as needed and rescue inhalers; Pt states she is normally able to function within her home without O2 but has required it lately and is not able to ambulate to the bathroom unless she uses her rescue inhaler. Pt able to ambulate to bathroom this session on RA with O2 desat to 88 and 2/4 dyspnea. Began educating pt on use of enrgy conservation techniques; recommend usw of rollator     Vision         Perception     Praxis      Pertinent Vitals/Pain Pain Assessment: No/denies pain     Hand Dominance Right   Extremity/Trunk Assessment Upper Extremity Assessment Upper Extremity Assessment: Generalized weakness   Lower Extremity Assessment Lower Extremity Assessment: Defer to PT evaluation   Cervical / Trunk Assessment Cervical / Trunk Assessment: Kyphotic   Communication Communication Communication: No difficulties   Cognition Arousal/Alertness: Awake/alert Behavior During Therapy: WFL for tasks assessed/performed Overall Cognitive Status: Within Functional Limits for tasks assessed  General Comments: Pt lives with her significant other who is available to assist as well as her son   General Comments  Pt reports she is wanting to go home at d/c and reports someone will be there to help her if needed. Pt reports recent losses/death in her immediate family. Pt discussing how she doesn't want to stop doing the things that bring her joy and the "fear of not being able to breath".     Exercises     Shoulder Instructions      Home Living Family/patient expects to be discharged to:: Private residence Living Arrangements: Non-relatives/Friends Available Help at Discharge:  Friend(s);Family;Available PRN/intermittently Type of Home: House Home Access: Stairs to enter [employer] of Steps: 3 Entrance Stairs-Rails: None Home Layout: One level     Bathroom Shower/Tub: Teacher, early years/pre: Standard Bathroom Accessibility: Yes How Accessible: Accessible via walker Home Equipment: Shower seat;Other (comment)(oxygen)   Additional Comments:      Prior Functioning/Environment Level of Independence: Independent                 OT Problem List: Decreased activity tolerance;Decreased knowledge of use of DME or AE;Cardiopulmonary status limiting activity      OT Treatment/Interventions: Self-care/ADL training;Energy conservation;DME and/or AE instruction;Therapeutic activities;Patient/family education    OT Goals(Current goals can be found in the care plan section) Acute Rehab OT Goals Patient Stated Goal: to feel better OT Goal Formulation: With patient Time For Goal Achievement: 01/14/18 Potential to Achieve Goals: Good  OT Frequency: Min 2X/week   Barriers to D/C:            Co-evaluation              AM-PAC OT "6 Clicks" Daily Activity     Outcome Measure Help from another person eating meals?: None Help from another person taking care of personal grooming?: None Help from another person toileting, which includes using toliet, bedpan, or urinal?: A Little Help from another person bathing (including washing, rinsing, drying)?: A Little Help from another person to put on and taking off regular upper body clothing?: None Help from another person to put on and taking off regular lower body clothing?: A Little 6 Click Score: 21   End of Session Equipment Utilized During Treatment: Oxygen(4L) Nurse Communication: Mobility status  Activity Tolerance: Patient tolerated treatment well Patient left: in bed;with call bell/phone within reach  OT Visit Diagnosis: Unsteadiness on feet (R26.81);Muscle weakness  (generalized) (M62.81)                Time: 1543-1610 OT Time Calculation (min): 27 min Charges:  OT General Charges $OT Visit: 1 Visit OT Evaluation $OT Eval Low Complexity: 1 Low OT Treatments $Self Care/Home Management : 8-22 mins  Maurie Boettcher, OT/L   Acute OT Clinical Specialist Acute Rehabilitation Services Pager [phone] Office [phone]   [facility] 12/31/2017, 4:25 PM

## 2017-12-31 NOTE — Discharge Instructions (Signed)

## 2017-12-31 NOTE — Progress Notes (Signed)
SATURATION QUALIFICATIONS: (This note is used to comply with regulatory documentation for home oxygen)  Patient Saturations on Room Air at Rest = 90%  Patient Saturations on Room Air while standing = 83%  Patient Saturations on 4 Liters of oxygen while Ambulating = 91%  Please briefly explain why patient needs home oxygen: Pt oxygen sats dropping to 83% on RA when standing at EOB. Required 4L to return to 90-91% during functional mobility tasks and gait. Pt requires supplemental oxygen to maintain appropriate oxygen saturation levels.   Leighton Ruff, PT, DPT  Acute Rehabilitation Services  Pager: [phone] Office: [phone]

## 2017-12-31 NOTE — Progress Notes (Addendum)
Initial Nutrition Assessment  DOCUMENTATION CODES:   Underweight, Severe malnutrition in context of chronic illness  INTERVENTION:   -MVI with minerals daily -Ensure Enlive po TID, each supplement provides 350 kcal and 20 grams of protein -Hormel Shake TID with meals, each supplement provides 520 kcals and 22 grams protein -Magic Cup TID with meals, each supplement provides 290 kcals and 9 grams protein -If pt continues with dysphagia and inadequate oral intake, may need to consider long term supplemental nutrition support (ex PEG) if consistent with pt's goals of care -Downgrade diet to dysphagia 3 (advanced mechanical soft) for ease of intake, secondary to dysphagia from esophageal stricture  NUTRITION DIAGNOSIS:   Severe Malnutrition related to chronic illness(COPD) as evidenced by energy intake < or equal to 75% for > or equal to 1 month, severe fat depletion, severe muscle depletion.  GOAL:   Patient will meet greater than or equal to 90% of their needs  MONITOR:   PO intake, Supplement acceptance, Labs, Weight trends, Skin, I & O's  REASON FOR ASSESSMENT:   Malnutrition Screening Tool, Consult Assessment of nutrition requirement/status  ASSESSMENT:   Tonya Harper is a 72 y.o. female with medical history significant for COPD, AAA, CAD, chronic systolic heart failure, hypertension, hyperlipidemia, chronic hypoxic respiratory failure, esophageal stricture, who presented to the ED today with c/o several days of progressive shortness of breath and cough.  Pt admitted with COPD exacerbation.   Case discussed with RN prior to visit, who reports that pt is fatigued, but able to medications without difficulty. RN provided a strawberry Ensure to pt just prior to visit.  Spoke pt and son at bedside. Pt reports decreased appetite for approximately one week, due to dysphagia from esophageal stricture and excess mucous. Pt typically tries to select softer texture foods due to  difficulty swallowing- she shares with this RD that if she consumes something harder in texture, such as a cracker, it takes her approximately 60-90 minutes to swallow it ("it just sits there"). Pt reports she will eat 2-3 times per day- typical intake is an Ensure supplement at breakfast, a [employer] hamburger for lunch, and spaghetti and salad at dinner. Today, pt consumed two bites of cracker in the ED ("which made me cough"), a bite of [employer] bo rounds, a a few sips of Ensure. Pt shared she attempted to consume sausage patty and french toast, but "it was too hard- like eating a styrofoam plate".   Pt reports UBW of 105#. She is unsure if she has lost weight, but "I know for sure I haven't gained any". Per review of wt hx, pt wt has been stable over the past 9 months.   Discussed importance of good meal and supplement intake to promote healing. Encouraged small, frequent meals, modified diet texture, and continuance of Ensure supplements at home.   Medications reviewed and include prednisone.   Labs reviewed.   NUTRITION - FOCUSED PHYSICAL EXAM:    Most Recent Value  Orbital Region  Severe depletion  Upper Arm Region  Severe depletion  Thoracic and Lumbar Region  Severe depletion  Buccal Region  Severe depletion  Temple Region  Severe depletion  Clavicle Bone Region  Severe depletion  Clavicle and Acromion Bone Region  Severe depletion  Scapular Bone Region  Severe depletion  Dorsal Hand  Severe depletion  Patellar Region  Severe depletion  Anterior Thigh Region  Severe depletion  Posterior Calf Region  Severe depletion  Edema (RD Assessment)  None  Hair  Reviewed  Eyes  Reviewed  Mouth  Reviewed  Skin  Reviewed  Nails  Reviewed       Diet Order:   Diet Order            DIET DYS 3 Room service appropriate? Yes; Fluid consistency: Thin  Diet effective now              EDUCATION NEEDS:   Education needs have been addressed  Skin:  Skin Assessment: Reviewed RN  Assessment  Last BM:  PTA  Height:   Ht Readings from Last 1 Encounters:  12/31/17 5\' 4"  (1.626 m)    Weight:   Wt Readings from Last 1 Encounters:  12/31/17 37.6 kg    Ideal Body Weight:  54.5 kg  BMI:  Body mass index is 14.25 kg/m.  Estimated Nutritional Needs:   Kcal:  1500-1700  Protein:  75-90 grams  Fluid:  1.5-1.7 L    Jenifer A. Jimmye Norman, RD, LDN, CDE Pager: [phone] After hours Pager: [phone]

## 2017-12-31 NOTE — Progress Notes (Signed)
Pt arrived to 6n24 from ED at this time. Oriented to room and surroundings. Son at bedside

## 2017-12-31 NOTE — Evaluation (Addendum)
Physical Therapy Evaluation Patient Details Name: Tonya Harper MRN: [id number] DOB: [birth year] Today's Date: 12/31/2017   History of Present Illness  Pt is a 72 y/o female admitted secondary to worsening SOB. Thought to be secondary to COPD exacerbation. Imgaing showed low probability for PE, but did show mucous plugging. PMH includes CHF, HTN, PAD, CAD, COPD, and ischemic cardiomyopathy.  Clinical Impression  Pt admitted secondary to problem above with deficits below. Gait limited to within the room secondary to fatigue and SOB. Pt's oxygen sats dropping to 83% on RA when standing at EOB and required seated rest and 4L of oxygen to return to 90-91%. Pt oxygen sats maintained at 90-91% on 4L throughout gait within the room. Pt reports she wants to go home at d/c and that "someone" will be there to assist; would not clarify who someone was. Educated about use of rollator to help with energy conservation during gait. Will continue to follow acutely to maximize functional mobility independence and safety.     Follow Up Recommendations Home health PT;Supervision for mobility/OOB    Equipment Recommendations  Other (comment)(rollator)    Recommendations for Other Services       Precautions / Restrictions Precautions Precautions: Fall;Other (comment) Precaution Comments: watch O2 sats Restrictions Weight Bearing Restrictions: No      Mobility  Bed Mobility Overal bed mobility: Modified Independent                Transfers Overall transfer level: Needs assistance Equipment used: None Transfers: Sit to/from Stand Sit to Stand: Min guard         General transfer comment: Min guard A for steadying assist. Pt sats dropping to 83% on RA when standing, and required seated rest and 4L of oxygen to return to 90-91%. Sats maintained at 90-91% on 4L when performing second stand.   Ambulation/Gait Ambulation/Gait assistance: Min guard Gait Distance (Feet): 20 Feet Assistive device:  None Gait Pattern/deviations: Step-through pattern;Decreased stride length Gait velocity: Decreased    General Gait Details: Slow, guarded gait. Mild unsteadiness noted, however, no LOB observed. Pt requiring 4L to maintain oxygen sats at 90-91% throughout gait. Further distance limited secondary to fatigue and SOB.   Stairs            Wheelchair Mobility    Modified Rankin (Stroke Patients Only)       Balance Overall balance assessment: Needs assistance Sitting-balance support: No upper extremity supported;Feet supported Sitting balance-Leahy Scale: Good     Standing balance support: No upper extremity supported;During functional activity Standing balance-Leahy Scale: Fair                               Pertinent Vitals/Pain Pain Assessment: No/denies pain    Home Living Family/patient expects to be discharged to:: Private residence Living Arrangements: Non-relatives/Friends Available Help at Discharge: Friend(s);Family;Available PRN/intermittently Type of Home: House Home Access: Stairs to enter Entrance Stairs-Rails: None Entrance Stairs-Number of Steps: 3 Home Layout: One level Home Equipment: Shower seat;Other (comment)(oxygen) Additional Comments: Pt reports "someone" will be with her at d/c, but did not state who she lived with.     Prior Function Level of Independence: Independent               Hand Dominance        Extremity/Trunk Assessment   Upper Extremity Assessment Upper Extremity Assessment: Defer to OT evaluation    Lower Extremity Assessment Lower Extremity Assessment: Generalized weakness  Cervical / Trunk Assessment Cervical / Trunk Assessment: Kyphotic  Communication   Communication: No difficulties  Cognition Arousal/Alertness: Lethargic Behavior During Therapy: Flat affect Overall Cognitive Status: No family/caregiver present to determine baseline cognitive functioning                                  General Comments: Pt somewhat slow to answer questions. Would have periods where she had a blank stare and then would respond to questions when asked again. Unsure of baseline.       General Comments General comments (skin integrity, edema, etc.): Pt reports she is wanting to go home at d/c and reports someone will be there to help her if needed.     Exercises     Assessment/Plan    PT Assessment Patient needs continued PT services  PT Problem List Cardiopulmonary status limiting activity;Decreased activity tolerance;Decreased balance;Decreased strength;Decreased mobility;Decreased knowledge of use of DME;Decreased knowledge of precautions       PT Treatment Interventions DME instruction;Gait training;Functional mobility training;Stair training;Therapeutic activities;Therapeutic exercise;Balance training;Patient/family education    PT Goals (Current goals can be found in the Care Plan section)  Acute Rehab PT Goals Patient Stated Goal: to feel better PT Goal Formulation: With patient Time For Goal Achievement: 01/14/18 Potential to Achieve Goals: Good    Frequency Min 3X/week   Barriers to discharge        Co-evaluation               AM-PAC PT "6 Clicks" Mobility  Outcome Measure Help needed turning from your back to your side while in a flat bed without using bedrails?: None Help needed moving from lying on your back to sitting on the side of a flat bed without using bedrails?: None Help needed moving to and from a bed to a chair (including a wheelchair)?: A Little Help needed standing up from a chair using your arms (e.g., wheelchair or bedside chair)?: A Little Help needed to walk in hospital room?: A Little Help needed climbing 3-5 steps with a railing? : A Lot 6 Click Score: 19    End of Session Equipment Utilized During Treatment: Gait belt;Oxygen Activity Tolerance: Patient limited by fatigue Patient left: in bed;with call bell/phone within  reach;with family/visitor present Nurse Communication: Mobility status;Other (comment)(oxygen sats) PT Visit Diagnosis: Unsteadiness on feet (R26.81);Muscle weakness (generalized) (M62.81);Other abnormalities of gait and mobility (R26.89)    Time: 4656-8127 PT Time Calculation (min) (ACUTE ONLY): 25 min   Charges:   PT Evaluation $PT Eval Moderate Complexity: 1 Mod PT Treatments $Therapeutic Activity: 8-22 mins        Leighton Ruff, PT, DPT  Acute Rehabilitation Services  Pager: [phone] Office: [phone]   Rudean Hitt 12/31/2017, 2:18 PM

## 2017-12-31 NOTE — Progress Notes (Signed)
[facility] PROGRESS NOTE  Tonya Harper QIO:[id number] DOB: 1945-08-08 DOA: 12/30/2017 PCP: Lanice Shirts, MD   Narrative: 72 year old female Prior history cardiac cath 09/2008 with nonobstructive disease Former smoker Diastolic dysfunction chronic based on echo for/2019 mitral regurg as well cardiomyopathy which is improved AAA 3.9 cm Peripheral vascular disease followed by Dr. Harrell Lark on Pletal Probable esophageal with decreased appetite Thyroidectomy 2013 COPD Severe malnutrition tension Hyperlipidemia  12/30/2017 with COPD exacerbation thought that she had a fever last week takes trilogy daily and has been using rescue inhaler 5-6 times a day for the past 4 to 5 days Chest x-ray negative d-dimer elevated no PE but mucous plugging   A & Plan Possible COPD exacerbation-she states she had a fever-respiratory viral panel negative l-as she is not having much sputum can use a 3-day course of azithromycin for COPD exacerbation-prednisone 40-nebulized with albuterol 2.5 every 6 as needed. Anoro Ellipta and continue Robitussin at this time-would discontinue ceftriaxone currently Cardiomyopathy and prior cardiac cath 09/2008-most recent EF is improved-continue Coreg 12.5 twice daily AAA-continue beta-blocker as above Peripheral arterial disease-continue aspirin 81 daily, Pletal 1 mg twice daily-outpatient follow-up with Dr. Alvester Chou Hyperlipidemia continue Pravachol 20 nightly Reflux and history of dysphagia stenosis-has been dilated numerous times in the past continue Prevacid 30 twice daily-may need to adjust diet if unable to eat Hypo-thyroidism secondary to thyroid surgery 2013 continue Synthroid 25 daily Very protein energy malnutrition-BMI 14  Full code presumed, Lovenox, elevation, if improved to the point with no fever and tolerating all p.o.'s may be able to discharge as early as a.m.  Verlon Au, MD  [facility] Direct contact: [phone] --Via amion app  OR  --[email or web address]; password [email or web address]  7PM-7AM contact night coverage as above 12/31/2017, 8:36 AM  LOS: 1 day   Consultants:  None  Procedures:  No  Antimicrobials:  Azithromycin ceftriaxone narrowed to ceftriaxone  Interval history/Subjective: Awake alert pleasant just came up from the ED in no distress mild cough no fever no chills no diarrhea some difficulty swallowing at times tells me she has been dilated numerous times in the past  Objective:  Vitals:  Vitals:   12/31/17 0458 12/31/17 0757  BP: 124/80 (!) 145/74  Pulse: 70 84  Resp: 20 (!) 21  Temp: 98.6 F (37 C) 98.6 F (37 C)  SpO2: 96% 96%    Exam:  . Awake pleasant oriented no distress no pallor no icterus . Very thin frail no thyromegaly no submandibular lymphadenopathy . Chest is clear with diminished posterior lateral lung sounds . Soft nontender . S1-S2 no murmur rub or gallop . Neurologically intact no focal deficit moving all 4 limbs rash   I have personally reviewed the following:   Labs:  None  Imaging studies:  No  Medical tests:    No  Test discussed with performing physician:  No  Decision to obtain old records: No  Review and summation of old records: No  Scheduled Meds: . aspirin EC  81 mg Oral QHS  . calcium carbonate  1 tablet Oral BID WC  . carvedilol  12.5 mg Oral BID WC  . cholecalciferol  2,000 Units Oral Daily  . cilostazol  50 mg Oral BID  . enoxaparin (LOVENOX) injection  40 mg Subcutaneous Q24H  . umeclidinium-vilanterol  1 puff Inhalation Daily   And  . fluticasone  1 puff Inhalation Daily  . guaiFENesin  15 mL Oral Q6H  . levothyroxine  25 mcg Oral Q24H  . pantoprazole  40 mg  Oral Daily  . pravastatin  20 mg Oral QHS  . predniSONE  40 mg Oral Q breakfast   Continuous Infusions: . azithromycin Stopped (12/30/17 2207)  . cefTRIAXone (ROCEPHIN)  IV Stopped (12/30/17 2145)    Principal Problem:   COPD with acute exacerbation (HCC) Active  Problems:   Pure hypercholesterolemia   Coronary atherosclerosis   Chronic systolic heart failure (HCC)   Weight loss   Hypertension   Protein-calorie malnutrition, severe (HCC)   Chronic respiratory failure ([place])   Peripheral arterial disease ([place])   LOS: 1 day

## 2017-12-31 NOTE — Care Management Note (Signed)
Case Management Note  Patient Details  Name: Tonya Harper MRN: [id number] Date of Birth: 04/23/1945  Subjective/Objective:                    Action/Plan: Patient from home, has oxygen uses PRN.  Consult for COPD.   If MD feels patient would benefit from home health RN for disease management, please place order and face to face.  Expected Discharge Date:                  Expected Discharge Plan:     In-House Referral:     Discharge planning Services  CM Consult  Post Acute Care Choice:  Home Health Choice offered to:     DME Arranged:    DME Agency:     HH Arranged:    HH Agency:     Status of Service:  In process, will continue to follow  If discussed at Long Length of Stay Meetings, dates discussed:    Additional Comments:  Marilu Favre, RN 12/31/2017, 8:46 AM

## 2017-12-31 NOTE — ED Notes (Signed)
Attempted report 

## 2018-01-01 ENCOUNTER — Telehealth: Payer: Self-pay | Admitting: *Deleted

## 2018-01-01 LAB — CBC WITH DIFFERENTIAL/PLATELET
Abs Immature Granulocytes: 0.04 10*3/uL (ref 0.00–0.07)
Basophils Absolute: 0 10*3/uL (ref 0.0–0.1)
Basophils Relative: 0 %
Eosinophils Absolute: 0 10*3/uL (ref 0.0–0.5)
Eosinophils Relative: 0 %
HCT: 31.9 % — ABNORMAL LOW (ref 36.0–46.0)
Hemoglobin: 9.7 g/dL — ABNORMAL LOW (ref 12.0–15.0)
Immature Granulocytes: 0 %
Lymphocytes Relative: 21 %
Lymphs Abs: 2.5 10*3/uL (ref 0.7–4.0)
MCH: 27.2 pg (ref 26.0–34.0)
MCHC: 30.4 g/dL (ref 30.0–36.0)
MCV: 89.4 fL (ref 80.0–100.0)
Monocytes Absolute: 0.7 10*3/uL (ref 0.1–1.0)
Monocytes Relative: 6 %
Neutro Abs: 8.5 10*3/uL — ABNORMAL HIGH (ref 1.7–7.7)
Neutrophils Relative %: 73 %
Platelets: 264 10*3/uL (ref 150–400)
RBC: 3.57 MIL/uL — ABNORMAL LOW (ref 3.87–5.11)
RDW: 13.9 % (ref 11.5–15.5)
WBC: 11.7 10*3/uL — ABNORMAL HIGH (ref 4.0–10.5)
nRBC: 0 % (ref 0.0–0.2)

## 2018-01-01 LAB — BASIC METABOLIC PANEL
Anion gap: 11 (ref 5–15)
BUN: 31 mg/dL — ABNORMAL HIGH (ref 8–23)
CO2: 25 mmol/L (ref 22–32)
Calcium: 9.1 mg/dL (ref 8.9–10.3)
Chloride: 101 mmol/L (ref 98–111)
Creatinine, Ser: 1.12 mg/dL — ABNORMAL HIGH (ref 0.44–1.00)
GFR calc Af Amer: 58 mL/min — ABNORMAL LOW (ref >60)
GFR calc non Af Amer: 50 mL/min — ABNORMAL LOW (ref >60)
Glucose, Bld: 101 mg/dL — ABNORMAL HIGH (ref 70–99)
Potassium: 3.6 mmol/L (ref 3.5–5.1)
Sodium: 137 mmol/L (ref 135–145)

## 2018-01-01 MED ORDER — AZITHROMYCIN 500 MG PO TABS: 500.0000 mg | ORAL_TABLET | Freq: Every day | ORAL | 0 refills | Status: AC

## 2018-01-01 MED ORDER — PREDNISONE 20 MG PO TABS: 40.0000 mg | ORAL_TABLET | Freq: Every day | ORAL | 0 refills | Status: AC

## 2018-01-01 MED ORDER — GUAIFENESIN 100 MG/5ML PO SOLN: 15.0000 mL | Freq: Four times a day (QID) | ORAL | 0 refills | Status: DC

## 2018-01-01 NOTE — Discharge Summary (Signed)
Physician Discharge Summary  Tonya Harper OZH:[id number] DOB: [birth year] DOA: 12/30/2017  PCP: Lanice Shirts, MD  Admit date: 12/30/2017 Discharge date: 01/01/2018  Time spent: 20 minutes  Recommendations for Outpatient Follow-up:  1. Complete 3-day course of azithromycin and prednisone 2. Continue inhalers 3. Get x-ray in 1 month as outpatient 4. Recheck Chem-7 in about 4 days to a week because of mild dehydration on discharge  Discharge Diagnoses:  Principal Problem:   COPD with acute exacerbation ([place]) Active Problems:   Pure hypercholesterolemia   Coronary atherosclerosis   Chronic systolic heart failure (HCC)   Weight loss   Hypertension   Protein-calorie malnutrition, severe (HCC)   Chronic respiratory failure ([place])   Peripheral arterial disease ([place])   Discharge Condition: Improved  Diet recommendation: Heart healthy  Filed Weights   12/31/17 0757  Weight: 37.6 kg    History of present illness:  72 year old female Prior history cardiac cath 09/2008 with nonobstructive disease Former smoker Diastolic dysfunction chronic based on echo for/2019 mitral regurg as well cardiomyopathy which is improved AAA 3.9 cm Peripheral vascular disease followed by Dr. Harrell Lark on Pletal Probable esophageal with decreased appetite Thyroidectomy 2013 COPD Severe malnutrition tension Hyperlipidemia  12/30/2017 with COPD exacerbation thought that she had a fever last week takes trilogy daily and has been using rescue inhaler 5-6 times a day for the past 4 to 5 days Chest x-ray negative d-dimer elevated no PE but mucous plugging  Hospital Course:  Possible COPD exacerbation-she states she had a fever-respiratory viral panel negative l-as she is not having much sputum can use a 3-day course of azithromycin for COPD exacerbation-prednisone 40-nebulized with albuterol 2.5 every 6 as needed. Anoro Ellipta and continue Robitussin at this time  Mild AKI-BUN/creatinine  slightly elevated on discharge-I have encouraged her to drink 2 L a day and get labs in the outpatient setting  Cardiomyopathy and prior cardiac cath 09/2008-most recent EF is improved-continue Coreg 12.5 twice daily AAA-continue beta-blocker as above  Peripheral arterial disease-continue aspirin 81 daily, Pletal 1 mg twice daily-outpatient follow-up with Dr. Alvester Chou  Hyperlipidemia continue Pravachol 20 nightly  Reflux and history of dysphagia stenosis-has been dilated numerous times in the past continue Prevacid 30 twice daily-may  need to adjust diet if unable to eat  Hypo-thyroidism secondary to thyroid surgery 2013 continue Synthroid 25 daily  Severe protein energy malnutrition likely secondary to dysphagia and stenosis-BMI 14    Discharge Exam: Vitals:   01/01/18 0549 01/01/18 0754  BP: (!) 104/58   Pulse: 68   Resp: 16   Temp: 98.4 F (36.9 C)   SpO2: 93% 95%    General: Awake alert pleasant no distress EOMI NCAT frail appearing Cardiovascular: S1-S2 no murmur rub or gallop Respiratory: Clinically clear no rales no rhonchi Abdomen soft nontender no rebound no guarding  Discharge Instructions   Discharge Instructions    Diet - low sodium heart healthy   Complete by:  As directed    Discharge instructions   Complete by:  As directed    We have prescribed for you azithromycin and prednisone on discharge please pick them up from your pharmacy in the next day and take them as directed Please drink ample amounts of water in the next 24 to 48 hours about 2 L a day as your kidneys were little dehydrated and you will need labs to check on this in the future in about 1 week's time I would recommend that you also get an x-ray in about 1 month  Please follow with your gastroenterologist and make sure that you get follow-up for your esophageal stenosis   Increase activity slowly   Complete by:  As directed      Allergies as of 01/01/2018      Reactions   Aspirin Swelling    nausea and dizziness after taking for one month at a time.. States her physician told her to use the 81 mg vs the 325mg . Other reaction(s): Dizziness (intolerance) nausea and dizziness after taking for one month at a time.. States her physician told her to use the 81 mg vs the 325mg .   Pneumovax [pneumococcal Polysaccharide Vaccine] Hives, Swelling   Shellfish Allergy Swelling, Anaphylaxis   Medication withdrawal symptoms Medication withdrawal symptoms Medication withdrawal symptoms   Tdap [tetanus-diphth-acell Pertussis] Swelling, Rash      Medication List    TAKE these medications   acetaminophen 325 MG tablet Commonly known as:  TYLENOL Take 650 mg by mouth every 6 (six) hours as needed for mild pain or moderate pain. For pain   albuterol (2.5 MG/3ML) 0.083% nebulizer solution Commonly known as:  PROVENTIL USE 1 AMPULE IN NEBULIZER EVERY 6 HOURS AS NEEDED FOR WHEEZING/SHORTNESS OF BREATH What changed:    See the new instructions.  Another medication with the same name was removed. Continue taking this medication, and follow the directions you see here.   aspirin EC 81 MG tablet Take 1 tablet (81 mg total) by mouth daily. What changed:  when to take this   azithromycin 500 MG tablet Commonly known as:  ZITHROMAX Take 1 tablet (500 mg total) by mouth daily for 3 days. Take 1 tablet daily for 3 days.   calcium gluconate 500 MG tablet Take 1 tablet by mouth 2 (two) times daily.   carvedilol 12.5 MG tablet Commonly known as:  COREG Take 1 tablet (12.5 mg total) by mouth 2 (two) times daily with a meal.   cilostazol 50 MG tablet Commonly known as:  PLETAL TAKE 1 TABLET (50 MG TOTAL) BY MOUTH 2 (TWO) TIMES DAILY.   clotrimazole 1 % cream Commonly known as:  LOTRIMIN Apply 1 application topically 4 (four) times daily as needed. What changed:  reasons to take this   Fluticasone-Umeclidin-Vilant 100-62.5-25 MCG/INH Aepb Inhale 1 puff into the lungs daily. What changed:   Another medication with the same name was removed. Continue taking this medication, and follow the directions you see here.   guaiFENesin 100 MG/5ML Soln Commonly known as:  ROBITUSSIN Take 15 mLs (300 mg total) by mouth every 6 (six) hours.   lansoprazole 30 MG capsule Commonly known as:  PREVACID Take 30 mg by mouth 2 (two) times daily before a meal.   levothyroxine 25 MCG tablet Commonly known as:  SYNTHROID, LEVOTHROID Take 25 mcg by mouth daily.   nitroGLYCERIN 0.4 MG SL tablet Commonly known as:  NITROSTAT Place 1 tablet (0.4 mg total) under the tongue every 5 (five) minutes as needed for chest pain (X3 DOSES BEFORE CALLING 911).   pravastatin 20 MG tablet Commonly known as:  PRAVACHOL TAKE 1 TABLET BY MOUTH EVERYDAY AT BEDTIME What changed:  See the new instructions.   predniSONE 20 MG tablet Commonly known as:  DELTASONE Take 2 tablets (40 mg total) by mouth daily with breakfast for 3 days. Start taking on:  01/02/2018 What changed:    medication strength  how much to take   [place] into the lungs daily as needed (shortness of breath). (Oxygen) 2-3 liters  Vitamin D (Ergocalciferol) 50 MCG (2000 UT) Caps Take 2,000 Units by mouth daily.      Allergies  Allergen Reactions  . Aspirin Swelling    nausea and dizziness after taking for one month at a time.. States her physician told her to use the 81 mg vs the 325mg . Other reaction(s): Dizziness (intolerance) nausea and dizziness after taking for one month at a time.. States her physician told her to use the 81 mg vs the 325mg .  . Pneumovax [Pneumococcal Polysaccharide Vaccine] Hives and Swelling  . Shellfish Allergy Swelling and Anaphylaxis    Medication withdrawal symptoms Medication withdrawal symptoms Medication withdrawal symptoms   . Tdap [Tetanus-Diphth-Acell Pertussis] Swelling and Rash      The results of significant diagnostics from this hospitalization (including imaging,  microbiology, ancillary and laboratory) are listed below for reference.    Significant Diagnostic Studies: Dg Chest 2 View  Result Date: 12/30/2017 CLINICAL DATA:  Cough and congestion.  Wheezing. EXAM: CHEST - 2 VIEW COMPARISON:  CT scan May 03, 2014.  Chest x-ray April 10, 2017. FINDINGS: Hyperexpansion of the lungs consistent with known emphysema. The heart, hila, mediastinum, lungs, and pleura are otherwise unremarkable. IMPRESSION: Emphysematous changes in the lungs.  No acute infiltrate. Electronically Signed   By: Dorise Bullion III M.D   On: 12/30/2017 12:53   Ct Angio Chest Pe W And/or Wo Contrast  Result Date: 12/30/2017 CLINICAL DATA:  Hypoxia. EXAM: CT ANGIOGRAPHY CHEST WITH CONTRAST TECHNIQUE: Multidetector CT imaging of the chest was performed using the standard protocol during bolus administration of intravenous contrast. Multiplanar CT image reconstructions and MIPs were obtained to evaluate the vascular anatomy. CONTRAST:  168mL ISOVUE-370 IOPAMIDOL (ISOVUE-370) INJECTION 76% COMPARISON:  Radiographs of same day.  CT scan of May 03, 2014. FINDINGS: Cardiovascular: Satisfactory opacification of the pulmonary arteries to the segmental level. No evidence of pulmonary embolism. Normal heart size. No pericardial effusion. Coronary artery calcifications are noted. Atherosclerosis of thoracic aorta is noted without aneurysm. Mediastinum/Nodes: Thyroid gland is not well visualized on this study. No definite adenopathy is noted. There remains diffuse wall thickening of the esophagus which is not changed compared to prior exam. Lungs/Pleura: No pneumothorax or pleural effusion is noted. Emphysematous disease is noted in the upper lobes bilaterally. Minimal bilateral posterior basilar subsegmental atelectasis is noted. Soft tissue density is seen within the lower lobe bronchi bilaterally concerning for mucous plugging or aspirated material. Upper Abdomen: No acute abnormality. Musculoskeletal: No  chest wall abnormality. No acute or significant osseous findings. Review of the MIP images confirms the above findings. IMPRESSION: No definite evidence of pulmonary embolus. Coronary artery calcifications are noted suggesting coronary artery disease. Stable diffuse wall thickening of the esophagus is noted suggesting chronic inflammatory etiology. Probable mucous plugging or aspirated material is seen in the lower lobe bronchi bilaterally. Aortic Atherosclerosis (ICD10-I70.0) and Emphysema (ICD10-J43.9). Electronically Signed   By: Marijo Conception, M.D.   On: 12/30/2017 16:57   Vas [place] Aaa Duplex  Result Date: 12/09/2017 ABDOMINAL AORTA STUDY Indications: Follow up exam for known AAA. Risk Factors: Hypertension.  Performing Technologist: Cardell Peach RDCS, RVT  Examination Guidelines: A complete evaluation includes B-mode imaging, spectral Doppler, color Doppler, and power Doppler as needed of all accessible portions of each vessel. Bilateral testing is considered an integral part of a complete examination. Limited examinations for reoccurring indications may be performed as noted.  Abdominal Aorta Findings: +-----------+-------+----------+----------+----------+--------+--------+ Location   AP (cm)Trans (cm)PSV (cm/s)Waveform  ThrombusComments +-----------+-------+----------+----------+----------+--------+--------+ Proximal   250.00  2.88      33                                   +-----------+-------+----------+----------+----------+--------+--------+ Mid        2.40   3.28      34                                   +-----------+-------+----------+----------+----------+--------+--------+ Distal     1.69   1.89      370       monophasicPresent fusiform +-----------+-------+----------+----------+----------+--------+--------+ RT CIA Prox0.8    0.8                 monophasicPresent fusiform +-----------+-------+----------+----------+----------+--------+--------+ RT EIA Prox                  48        monophasicPresent fusiform +-----------+-------+----------+----------+----------+--------+--------+ LT CIA Prox0.8    0.9       26        monophasicPresent fusiform +-----------+-------+----------+----------+----------+--------+--------+ LT EIA Prox                 48        biphasic  Present fusiform +-----------+-------+----------+----------+----------+--------+--------+  Summary: Abdominal Aorta: There is evidence of abnormal dilitation of the Mid Abdominal aorta. The largest aortic measurement is 3.3 cm. Stenosis: +--------------------+-----------------------+ Location            Stenosis                +--------------------+-----------------------+ Distal Aorta        Near complete Occlusion +--------------------+-----------------------+ Right Common Iliac  Near complete Occlusion +--------------------+-----------------------+ Left Common Iliac   Near complete Occlusion +--------------------+-----------------------+ Right External Iliac>50% stenosis           +--------------------+-----------------------+ Left External Iliac >50% stenosis           +--------------------+-----------------------+  Distal abdomial Aorta is near complete occlusion. Proxiaml left and right Iliac artries are near complete occlusion. *See table(s) above for measurements and observations.  Electronically signed by Jenne Campus MD on 12/09/2017 at 4:59:03 PM.    Final     Microbiology: Recent Results (from the past 240 hour(s))  Respiratory Panel by PCR     Status: None   Collection Time: 12/31/17  2:30 AM  Result Value Ref Range Status   Adenovirus NOT DETECTED NOT DETECTED Final   Coronavirus 229E NOT DETECTED NOT DETECTED Final   Coronavirus HKU1 NOT DETECTED NOT DETECTED Final   Coronavirus NL63 NOT DETECTED NOT DETECTED Final   Coronavirus OC43 NOT DETECTED NOT DETECTED Final   Metapneumovirus NOT DETECTED NOT DETECTED Final   Rhinovirus /  Enterovirus NOT DETECTED NOT DETECTED Final   Influenza A NOT DETECTED NOT DETECTED Final   Influenza B NOT DETECTED NOT DETECTED Final   Parainfluenza Virus 1 NOT DETECTED NOT DETECTED Final   Parainfluenza Virus 2 NOT DETECTED NOT DETECTED Final   Parainfluenza Virus 3 NOT DETECTED NOT DETECTED Final   Parainfluenza Virus 4 NOT DETECTED NOT DETECTED Final   Respiratory Syncytial Virus NOT DETECTED NOT DETECTED Final   Bordetella pertussis NOT DETECTED NOT DETECTED Final   Chlamydophila pneumoniae NOT DETECTED NOT DETECTED Final   Mycoplasma pneumoniae NOT DETECTED NOT DETECTED Final    Comment: Performed at [facility], [address] [place], [place] [address]  Labs: Basic Metabolic Panel: Recent Labs  Lab 12/30/17 1228 12/31/17 0213 01/01/18 0250  NA 139 137 137  K 3.7 3.9 3.6  CL 101 98 101  CO2 29 27 25   GLUCOSE 105* 96 101*  BUN 13 14 31*  CREATININE 1.09* 0.86 1.12*  CALCIUM 9.3 9.6 9.1   Liver Function Tests: Recent Labs  Lab 12/30/17 1228  AST 21  ALT 13  ALKPHOS 24*  BILITOT 0.6  PROT 7.6  ALBUMIN 3.5   No results for input(s): LIPASE, AMYLASE in the last 168 hours. No results for input(s): AMMONIA in the last 168 hours. CBC: Recent Labs  Lab 12/30/17 1228 12/31/17 0213 01/01/18 0250  WBC 13.1* 24.9* 11.7*  NEUTROABS 11.1*  --  8.5*  HGB 11.3* 11.3* 9.7*  HCT 36.7 34.9* 31.9*  MCV 91.8 88.4 89.4  PLT 291 261 264   Cardiac Enzymes: Recent Labs  Lab 12/30/17 1228  TROPONINI <0.03   BNP: BNP (last 3 results) Recent Labs    12/30/17 1223  BNP 86.8    ProBNP (last 3 results) No results for input(s): PROBNP in the last 8760 hours.  CBG: No results for input(s): GLUCAP in the last 168 hours.     Signed:  Nita Sells MD   [facility] 01/01/2018, 9:19 AM

## 2018-01-01 NOTE — Care Management Note (Signed)
Case Management Note  Patient Details  Name: Tonya Harper MRN: [id number] Date of Birth: Sep 24, 1945  Subjective/Objective:                    Action/Plan: Discussed discharge planning with patient at bedside. Patient states one of her sons is here , but just went downstairs to get something to eat. He will drive her home today.   When asked if she lives alone , she reports no , someone is with me 90 percent of the time.   Home address is : [address], [place], [place] [address]  Phone [phone]  PCP: Emi Belfast  Patient has home oxygen through [facility] already, however she does not have a portable tank for ride home today.  Explained to patient NCM will order a portable tank from [facility] who will bring it to her room prior to discharge for her drive home. Patient states "if it is one of those big rolling ones I do not want it. I will just use inhalers on the way home."  Advised patient to use portable oxygen tank for drive home. Explained her saturation was 83 on room air while standing and with activity it would decrease further. Staff and her son will help her with the portable tank. Explained to patient it is against medical advise for her not to use portable oxygen tank. Patient voiced understanding.   Ordered portable oxygen tank through [facility]. Dan with [facility] and Patti bedside nurse aware of above, and will also discuss concern of her not using portable tank.  Expected Discharge Date:  01/01/18               Expected Discharge Plan:  [place]  In-House Referral:     Discharge planning Services  CM Consult  Post Acute Care Choice:  Home Health Choice offered to:  Patient  DME Arranged:  3-N-1, Walker rolling with seat, Oxygen DME Agency:  [place]:  PT [place] Agency:  [place]  Status of Service:  Completed, signed off  If discussed at [place] of Stay Meetings, dates discussed:    Additional  Comments:  Marilu Favre, RN 01/01/2018, 11:01 AM

## 2018-01-01 NOTE — Telephone Encounter (Signed)
I called patient to review procedure instructions for Monday January 05, 2018. Pt states she is being discharged from [facility] today-will be given course of antibiotic and prednisone as outpatient, has been told she still has some deep congestion. Pt states  will follow up with Dr Lake Bells (pulmonary) or Dr Coralyn Mark (PCP) in the next week. Pt will contact Dr Kennon Holter office once she has follow-up and is ready to reschedule PV procedure.  I have cancelled abdominal aortogram scheduled for Monday January 05, 2018 and will forward this message to Dr Gwenlyn Found. Pt states Dr Coralyn Mark also recommended cancelling procedure for now.

## 2018-01-02 DIAGNOSIS — E7800 Pure hypercholesterolemia, unspecified: Secondary | ICD-10-CM | POA: Diagnosis not present

## 2018-01-02 DIAGNOSIS — I5022 Chronic systolic (congestive) heart failure: Secondary | ICD-10-CM | POA: Diagnosis not present

## 2018-01-02 DIAGNOSIS — J9690 Respiratory failure, unspecified, unspecified whether with hypoxia or hypercapnia: Secondary | ICD-10-CM | POA: Diagnosis not present

## 2018-01-02 DIAGNOSIS — J441 Chronic obstructive pulmonary disease with (acute) exacerbation: Secondary | ICD-10-CM | POA: Diagnosis not present

## 2018-01-02 DIAGNOSIS — R1310 Dysphagia, unspecified: Secondary | ICD-10-CM | POA: Diagnosis not present

## 2018-01-02 DIAGNOSIS — I739 Peripheral vascular disease, unspecified: Secondary | ICD-10-CM | POA: Diagnosis not present

## 2018-01-02 DIAGNOSIS — E43 Unspecified severe protein-calorie malnutrition: Secondary | ICD-10-CM | POA: Diagnosis not present

## 2018-01-02 DIAGNOSIS — Z7982 Long term (current) use of aspirin: Secondary | ICD-10-CM | POA: Diagnosis not present

## 2018-01-02 DIAGNOSIS — I2510 Atherosclerotic heart disease of native coronary artery without angina pectoris: Secondary | ICD-10-CM | POA: Diagnosis not present

## 2018-01-02 DIAGNOSIS — I110 Hypertensive heart disease with heart failure: Secondary | ICD-10-CM | POA: Diagnosis not present

## 2018-01-02 DIAGNOSIS — I429 Cardiomyopathy, unspecified: Secondary | ICD-10-CM | POA: Diagnosis not present

## 2018-01-02 DIAGNOSIS — Z87891 Personal history of nicotine dependence: Secondary | ICD-10-CM | POA: Diagnosis not present

## 2018-01-05 ENCOUNTER — Encounter (HOSPITAL_COMMUNITY): Admission: RE | Payer: Self-pay | Source: Ambulatory Visit

## 2018-01-05 ENCOUNTER — Ambulatory Visit (HOSPITAL_COMMUNITY): Admission: RE | Admit: 2018-01-05 | Payer: Medicare Other | Source: Ambulatory Visit | Admitting: Cardiovascular Disease

## 2018-01-05 SURGERY — ABDOMINAL AORTOGRAM W/LOWER EXTREMITY
Anesthesia: LOCAL

## 2018-01-06 ENCOUNTER — Other Ambulatory Visit: Payer: Self-pay | Admitting: Cardiology

## 2018-01-06 NOTE — Consult Note (Signed)
            [facility] CM Primary Care Navigator  01/06/2018  Tonya Harper 21-May-1945 [id number]   Attempt to seepatient at the bedside to identify possible discharge needs butshehad already been discharged. Patient went home with home health services.  Per MD note,patientwasadmitted for COPD with acute exacerbation. (cardiomyopathy, mitral regurgitation, severe malnutrition)  Patient noted having a follow-up appointment with cardiology and pulmonology in 1 week.   Primary care provider's office is listed as providing transition of care (TOC) follow-up.   For additional questions please contact:  Edwena Felty A. , BSN, RN-BC [facility] PRIMARY CARE Navigator Cell: [phone]

## 2018-01-07 DIAGNOSIS — I110 Hypertensive heart disease with heart failure: Secondary | ICD-10-CM | POA: Diagnosis not present

## 2018-01-07 DIAGNOSIS — I5022 Chronic systolic (congestive) heart failure: Secondary | ICD-10-CM | POA: Diagnosis not present

## 2018-01-07 DIAGNOSIS — I2510 Atherosclerotic heart disease of native coronary artery without angina pectoris: Secondary | ICD-10-CM | POA: Diagnosis not present

## 2018-01-07 DIAGNOSIS — J9690 Respiratory failure, unspecified, unspecified whether with hypoxia or hypercapnia: Secondary | ICD-10-CM | POA: Diagnosis not present

## 2018-01-07 DIAGNOSIS — E43 Unspecified severe protein-calorie malnutrition: Secondary | ICD-10-CM | POA: Diagnosis not present

## 2018-01-07 DIAGNOSIS — J441 Chronic obstructive pulmonary disease with (acute) exacerbation: Secondary | ICD-10-CM | POA: Diagnosis not present

## 2018-01-08 ENCOUNTER — Telehealth: Payer: Self-pay | Admitting: Cardiology

## 2018-01-08 DIAGNOSIS — E890 Postprocedural hypothyroidism: Secondary | ICD-10-CM | POA: Diagnosis not present

## 2018-01-08 MED ORDER — CARVEDILOL 12.5 MG PO TABS: 12.5000 mg | ORAL_TABLET | Freq: Two times a day (BID) | ORAL | 3 refills | Status: DC

## 2018-01-08 NOTE — Telephone Encounter (Signed)
New Message     *STAT* If patient is at the pharmacy, call can be transferred to refill team.   1. Which medications need to be refilled? (please list name of each medication and dose if known) carvedilol (COREG) 12.5 MG tablet   2. Which pharmacy/location (including street and city if local pharmacy) is medication to be sent to? [id number] - [place], [place] [address] AT [address]. Do they need a 30 day or 90 day supply? [place]

## 2018-01-08 NOTE — Telephone Encounter (Signed)
Coreg 12.5 mg 90 day prescription sent to pharmacy.

## 2018-01-09 ENCOUNTER — Other Ambulatory Visit: Payer: Self-pay | Admitting: Cardiovascular Disease

## 2018-01-09 DIAGNOSIS — I2510 Atherosclerotic heart disease of native coronary artery without angina pectoris: Secondary | ICD-10-CM | POA: Diagnosis not present

## 2018-01-09 DIAGNOSIS — J441 Chronic obstructive pulmonary disease with (acute) exacerbation: Secondary | ICD-10-CM | POA: Diagnosis not present

## 2018-01-09 DIAGNOSIS — I5022 Chronic systolic (congestive) heart failure: Secondary | ICD-10-CM | POA: Diagnosis not present

## 2018-01-09 DIAGNOSIS — I110 Hypertensive heart disease with heart failure: Secondary | ICD-10-CM | POA: Diagnosis not present

## 2018-01-09 DIAGNOSIS — I739 Peripheral vascular disease, unspecified: Secondary | ICD-10-CM

## 2018-01-09 DIAGNOSIS — E43 Unspecified severe protein-calorie malnutrition: Secondary | ICD-10-CM | POA: Diagnosis not present

## 2018-01-09 DIAGNOSIS — J9690 Respiratory failure, unspecified, unspecified whether with hypoxia or hypercapnia: Secondary | ICD-10-CM | POA: Diagnosis not present

## 2018-01-12 DIAGNOSIS — E46 Unspecified protein-calorie malnutrition: Secondary | ICD-10-CM | POA: Diagnosis not present

## 2018-01-12 DIAGNOSIS — I739 Peripheral vascular disease, unspecified: Secondary | ICD-10-CM | POA: Diagnosis not present

## 2018-01-12 DIAGNOSIS — I110 Hypertensive heart disease with heart failure: Secondary | ICD-10-CM | POA: Diagnosis not present

## 2018-01-12 DIAGNOSIS — J9690 Respiratory failure, unspecified, unspecified whether with hypoxia or hypercapnia: Secondary | ICD-10-CM | POA: Diagnosis not present

## 2018-01-12 DIAGNOSIS — E43 Unspecified severe protein-calorie malnutrition: Secondary | ICD-10-CM | POA: Diagnosis not present

## 2018-01-12 DIAGNOSIS — I5022 Chronic systolic (congestive) heart failure: Secondary | ICD-10-CM | POA: Diagnosis not present

## 2018-01-12 DIAGNOSIS — I255 Ischemic cardiomyopathy: Secondary | ICD-10-CM | POA: Diagnosis not present

## 2018-01-12 DIAGNOSIS — I2510 Atherosclerotic heart disease of native coronary artery without angina pectoris: Secondary | ICD-10-CM | POA: Diagnosis not present

## 2018-01-12 DIAGNOSIS — J441 Chronic obstructive pulmonary disease with (acute) exacerbation: Secondary | ICD-10-CM | POA: Diagnosis not present

## 2018-01-13 ENCOUNTER — Encounter (HOSPITAL_COMMUNITY): Payer: Medicare Other

## 2018-01-13 ENCOUNTER — Ambulatory Visit (INDEPENDENT_AMBULATORY_CARE_PROVIDER_SITE_OTHER): Payer: Medicare Other | Admitting: Adult Health

## 2018-01-13 ENCOUNTER — Encounter: Payer: Self-pay | Admitting: Adult Health

## 2018-01-13 ENCOUNTER — Inpatient Hospital Stay (HOSPITAL_COMMUNITY): Admission: RE | Admit: 2018-01-13 | Payer: Medicare Other | Source: Ambulatory Visit

## 2018-01-13 DIAGNOSIS — J449 Chronic obstructive pulmonary disease, unspecified: Secondary | ICD-10-CM | POA: Diagnosis not present

## 2018-01-13 DIAGNOSIS — I2510 Atherosclerotic heart disease of native coronary artery without angina pectoris: Secondary | ICD-10-CM

## 2018-01-13 DIAGNOSIS — J9611 Chronic respiratory failure with hypoxia: Secondary | ICD-10-CM

## 2018-01-13 DIAGNOSIS — E43 Unspecified severe protein-calorie malnutrition: Secondary | ICD-10-CM | POA: Diagnosis not present

## 2018-01-13 MED ORDER — PREDNISONE 10 MG PO TABS: ORAL_TABLET | ORAL | 3 refills | Status: DC

## 2018-01-13 NOTE — Assessment & Plan Note (Signed)
Encouraged on a healthy high-protein diet.  Advised to add a protein shake twice a day between meals.  Would like for her to replace her 5 pounds that was recently lost during her hospitalization.

## 2018-01-13 NOTE — Patient Instructions (Addendum)
Continue on TRELEGY 1 puff daily , rinse after use.  Mucinex DM Twice daily  As needed  Cough/congestion .  Add ensure/protein drink Twice daily  Between meals.  Continue on oxygen 2 l/m with activity and At bedtime  .  Follow up with Dr. Lake Bells in 3 weeks with spirometry for surgical clearance.  Please contact office for sooner follow up if symptoms do not improve or worsen or seek emergency care
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
## 2018-01-13 NOTE — Assessment & Plan Note (Signed)
O2 saturations normal on room air at rest.  Patient is to continue on oxygen 2 L with activity and at bedtime.

## 2018-01-13 NOTE — Progress Notes (Signed)
Reviewed, agree 

## 2018-01-13 NOTE — Assessment & Plan Note (Addendum)
Recent exacerbation now resolving Patient does have a standing order for short course of prednisone to have if she has a COPD flare.  Patient education given and advised on cautious use with steroids. She is continue on her current regimen.  Advised to increase the protein in her diet. She is to follow-up in 3 weeks with a spirometry.  As she may have to a have a preop clearance if she is to have surgery in the near future.  Plan  Patient Instructions  Continue on TRELEGY 1 puff daily , rinse after use.  Mucinex DM Twice daily  As needed  Cough/congestion .  Add ensure/protein drink Twice daily  Between meals.  Continue on oxygen 2 l/m with activity and At bedtime  .  Follow up with Dr. Lake Bells in 3 weeks with spirometry for surgical clearance.  Please contact office for sooner follow up if symptoms do not improve or worsen or seek emergency care

## 2018-01-15 DIAGNOSIS — E890 Postprocedural hypothyroidism: Secondary | ICD-10-CM | POA: Diagnosis not present

## 2018-01-16 ENCOUNTER — Ambulatory Visit: Payer: Medicare Other | Admitting: Cardiovascular Disease

## 2018-01-20 DIAGNOSIS — I5022 Chronic systolic (congestive) heart failure: Secondary | ICD-10-CM | POA: Diagnosis not present

## 2018-01-20 DIAGNOSIS — J9690 Respiratory failure, unspecified, unspecified whether with hypoxia or hypercapnia: Secondary | ICD-10-CM | POA: Diagnosis not present

## 2018-01-20 DIAGNOSIS — J441 Chronic obstructive pulmonary disease with (acute) exacerbation: Secondary | ICD-10-CM | POA: Diagnosis not present

## 2018-01-20 DIAGNOSIS — I2510 Atherosclerotic heart disease of native coronary artery without angina pectoris: Secondary | ICD-10-CM | POA: Diagnosis not present

## 2018-01-20 DIAGNOSIS — I110 Hypertensive heart disease with heart failure: Secondary | ICD-10-CM | POA: Diagnosis not present

## 2018-01-20 DIAGNOSIS — E43 Unspecified severe protein-calorie malnutrition: Secondary | ICD-10-CM | POA: Diagnosis not present

## 2018-01-23 DIAGNOSIS — Z1231 Encounter for screening mammogram for malignant neoplasm of breast: Secondary | ICD-10-CM | POA: Diagnosis not present

## 2018-01-29 DIAGNOSIS — J441 Chronic obstructive pulmonary disease with (acute) exacerbation: Secondary | ICD-10-CM | POA: Diagnosis not present

## 2018-01-29 DIAGNOSIS — E43 Unspecified severe protein-calorie malnutrition: Secondary | ICD-10-CM | POA: Diagnosis not present

## 2018-01-29 DIAGNOSIS — I2510 Atherosclerotic heart disease of native coronary artery without angina pectoris: Secondary | ICD-10-CM | POA: Diagnosis not present

## 2018-01-29 DIAGNOSIS — J9690 Respiratory failure, unspecified, unspecified whether with hypoxia or hypercapnia: Secondary | ICD-10-CM | POA: Diagnosis not present

## 2018-01-29 DIAGNOSIS — I5022 Chronic systolic (congestive) heart failure: Secondary | ICD-10-CM | POA: Diagnosis not present

## 2018-01-29 DIAGNOSIS — I110 Hypertensive heart disease with heart failure: Secondary | ICD-10-CM | POA: Diagnosis not present

## 2018-02-02 ENCOUNTER — Ambulatory Visit (HOSPITAL_BASED_OUTPATIENT_CLINIC_OR_DEPARTMENT_OTHER)
Admission: RE | Admit: 2018-02-02 | Discharge: 2018-02-02 | Disposition: A | Discharge status: Home / Self Care | Payer: Medicare Other | Source: Ambulatory Visit | Attending: Internal Medicine | Admitting: Internal Medicine

## 2018-02-02 ENCOUNTER — Other Ambulatory Visit (HOSPITAL_BASED_OUTPATIENT_CLINIC_OR_DEPARTMENT_OTHER): Payer: Self-pay | Admitting: Internal Medicine

## 2018-02-02 DIAGNOSIS — J441 Chronic obstructive pulmonary disease with (acute) exacerbation: Secondary | ICD-10-CM | POA: Insufficient documentation

## 2018-02-02 DIAGNOSIS — R0602 Shortness of breath: Secondary | ICD-10-CM | POA: Diagnosis not present

## 2018-02-03 ENCOUNTER — Ambulatory Visit (INDEPENDENT_AMBULATORY_CARE_PROVIDER_SITE_OTHER): Payer: Medicare Other | Admitting: Pulmonary Disease

## 2018-02-03 ENCOUNTER — Encounter: Payer: Self-pay | Admitting: Pulmonary Disease

## 2018-02-03 VITALS — BP 122/60 | HR 71 | Ht 64.0 in | Wt 80.8 lb

## 2018-02-03 DIAGNOSIS — E43 Unspecified severe protein-calorie malnutrition: Secondary | ICD-10-CM

## 2018-02-03 DIAGNOSIS — J449 Chronic obstructive pulmonary disease, unspecified: Secondary | ICD-10-CM | POA: Diagnosis not present

## 2018-02-03 DIAGNOSIS — J9611 Chronic respiratory failure with hypoxia: Secondary | ICD-10-CM
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
## 2018-02-03 NOTE — Patient Instructions (Signed)
Peripheral vascular disease > I think it is a good idea for you to have an angiogran and I do not feel you rlung disease is prohibitive of this  COPD: > continue Trelegy one puff daily > use albuterol as needed for dyspnea  Physical deconditioning: > exercise as much as possible  Chronic respiratory failure with hypoxemia > Use 2 L O2 when you exert yourself.    Follow up in 2-3 months or sooner if needed

## 2018-02-04 DIAGNOSIS — E43 Unspecified severe protein-calorie malnutrition: Secondary | ICD-10-CM | POA: Diagnosis not present

## 2018-02-04 DIAGNOSIS — E890 Postprocedural hypothyroidism: Secondary | ICD-10-CM | POA: Diagnosis not present

## 2018-02-04 DIAGNOSIS — J9611 Chronic respiratory failure with hypoxia: Secondary | ICD-10-CM | POA: Diagnosis not present

## 2018-02-04 DIAGNOSIS — Z1389 Encounter for screening for other disorder: Secondary | ICD-10-CM | POA: Diagnosis not present

## 2018-02-04 DIAGNOSIS — I2510 Atherosclerotic heart disease of native coronary artery without angina pectoris: Secondary | ICD-10-CM | POA: Diagnosis not present

## 2018-02-04 DIAGNOSIS — I10 Essential (primary) hypertension: Secondary | ICD-10-CM | POA: Diagnosis not present

## 2018-02-04 DIAGNOSIS — Z1211 Encounter for screening for malignant neoplasm of colon: Secondary | ICD-10-CM | POA: Diagnosis not present

## 2018-02-04 DIAGNOSIS — Z7189 Other specified counseling: Secondary | ICD-10-CM | POA: Diagnosis not present

## 2018-02-04 DIAGNOSIS — Z0000 Encounter for general adult medical examination without abnormal findings: Secondary | ICD-10-CM | POA: Diagnosis not present

## 2018-02-04 DIAGNOSIS — M810 Age-related osteoporosis without current pathological fracture: Secondary | ICD-10-CM | POA: Diagnosis not present

## 2018-02-04 DIAGNOSIS — J449 Chronic obstructive pulmonary disease, unspecified: Secondary | ICD-10-CM | POA: Diagnosis not present

## 2018-02-04 DIAGNOSIS — E7800 Pure hypercholesterolemia, unspecified: Secondary | ICD-10-CM | POA: Diagnosis not present

## 2018-02-04 DIAGNOSIS — E559 Vitamin D deficiency, unspecified: Secondary | ICD-10-CM | POA: Diagnosis not present

## 2018-02-07 ENCOUNTER — Other Ambulatory Visit: Payer: Self-pay | Admitting: Cardiology

## 2018-02-07 DIAGNOSIS — E785 Hyperlipidemia, unspecified: Secondary | ICD-10-CM

## 2018-02-12 ENCOUNTER — Other Ambulatory Visit: Payer: Self-pay | Admitting: Pulmonary Disease

## 2018-02-12 NOTE — Telephone Encounter (Signed)
BQ please advise on refill- last office note on 02/03/18 makes no note of continuing maintenance prednisone.  Thanks!

## 2018-02-16 DIAGNOSIS — Z9189 Other specified personal risk factors, not elsewhere classified: Secondary | ICD-10-CM | POA: Diagnosis not present

## 2018-02-16 DIAGNOSIS — Z7251 High risk heterosexual behavior: Secondary | ICD-10-CM | POA: Diagnosis not present
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
## 2018-02-20 ENCOUNTER — Ambulatory Visit (INDEPENDENT_AMBULATORY_CARE_PROVIDER_SITE_OTHER): Payer: Medicare Other | Admitting: Cardiology

## 2018-02-20 ENCOUNTER — Encounter: Payer: Self-pay | Admitting: Cardiology

## 2018-02-20 VITALS — BP 108/80 | HR 72 | Ht 64.0 in | Wt 79.0 lb

## 2018-02-20 DIAGNOSIS — I714 Abdominal aortic aneurysm, without rupture, unspecified: Secondary | ICD-10-CM

## 2018-02-20 DIAGNOSIS — I2510 Atherosclerotic heart disease of native coronary artery without angina pectoris: Secondary | ICD-10-CM

## 2018-02-20 DIAGNOSIS — I10 Essential (primary) hypertension: Secondary | ICD-10-CM | POA: Diagnosis not present

## 2018-02-20 DIAGNOSIS — E7800 Pure hypercholesterolemia, unspecified: Secondary | ICD-10-CM | POA: Diagnosis not present

## 2018-02-20 MED ORDER — NITROGLYCERIN 0.4 MG SL SUBL: 0.4000 mg | SUBLINGUAL_TABLET | SUBLINGUAL | 3 refills | Status: DC | PRN

## 2018-02-20 NOTE — Patient Instructions (Signed)
Medication Instructions:  NO CHANGE If you need a refill on your cardiac medications before your next appointment, please call your pharmacy.   Lab work: If you have labs (blood work) drawn today and your tests are completely normal, you will receive your results only by: Marland Kitchen MyChart Message (if you have MyChart) OR . A paper copy in the mail If you have any lab test that is abnormal or we need to change your treatment, we will call you to review the results.  Follow-Up: At [facility], you and your health needs are our priority.  As part of our continuing mission to provide you with exceptional heart care, we have created designated Provider Care Teams.  These Care Teams include your primary Cardiologist (physician) and Advanced Practice Providers (APPs -  Physician Assistants and Nurse Practitioners) who all work together to provide you with the care you need, when you need it. You will need a follow up appointment in 6 months.  Please call our office 2 months in advance to schedule this appointment.  You may see Kirk Ruths MD or one of the following Advanced Practice Providers on your designated Care Team:   Kerin Ransom, PA-C Roby Lofts, [place] . Sande Rives, PA-C  CALL IN MAY TO SCHEDULE APPOINTMENT IN [place]

## 2018-02-27 ENCOUNTER — Telehealth: Payer: Self-pay | Admitting: *Deleted

## 2018-02-27 DIAGNOSIS — H01001 Unspecified blepharitis right upper eyelid: Secondary | ICD-10-CM | POA: Diagnosis not present

## 2018-02-27 NOTE — Telephone Encounter (Signed)
Patient had to cancel her procedure with dr berry because she was sick. She is ready to reschedule PV procedure. Will she need to be seen again or can the procedure just be rescheduled? Will forward to dr berry to review and advise

## 2018-02-28 NOTE — Telephone Encounter (Signed)
She will need to be seen in the office again prior to scheduling her procedure.

## 2018-03-02 NOTE — Telephone Encounter (Signed)
Advised pt that she would need to be seen in office before rescheduling PV procedure. Pt scheduled for appt on 03/03/2018 at [place]. Pt agreeable with this and verbalized understanding

## 2018-03-03 ENCOUNTER — Ambulatory Visit: Payer: Medicare Other | Admitting: Cardiovascular Disease

## 2018-03-05 ENCOUNTER — Ambulatory Visit (INDEPENDENT_AMBULATORY_CARE_PROVIDER_SITE_OTHER): Payer: Medicare Other | Admitting: Adult Health

## 2018-03-05 ENCOUNTER — Encounter: Payer: Self-pay | Admitting: Adult Health

## 2018-03-05 DIAGNOSIS — E43 Unspecified severe protein-calorie malnutrition: Secondary | ICD-10-CM

## 2018-03-05 DIAGNOSIS — I2510 Atherosclerotic heart disease of native coronary artery without angina pectoris: Secondary | ICD-10-CM | POA: Diagnosis not present

## 2018-03-05 DIAGNOSIS — J449 Chronic obstructive pulmonary disease, unspecified: Secondary | ICD-10-CM

## 2018-03-05 DIAGNOSIS — J9611 Chronic respiratory failure with hypoxia: Secondary | ICD-10-CM

## 2018-03-05 MED ORDER — PREDNISONE 10 MG PO TABS: ORAL_TABLET | ORAL | 0 refills | Status: DC

## 2018-03-05 MED ORDER — AMOXICILLIN-POT CLAVULANATE 875-125 MG PO TABS: 1.0000 | ORAL_TABLET | Freq: Two times a day (BID) | ORAL | 0 refills | Status: AC

## 2018-03-05 NOTE — Patient Instructions (Addendum)
Augmentin 875mg  Twice daily  For 1 week, take with food.  Prednisone taper over next week.  Continue on TRELEGY 1 puff daily , rinse after use.  Mucinex DM Twice daily  As needed  Cough/congestion .  Add ensure/protein drink Twice daily  Between meals.  Continue on oxygen 2 l/m with activity and At bedtime  .  Follow up with Dr. Lake Bells or Parrett NP in [facility]  in 6 weeks and As needed   Please contact office for sooner follow up if symptoms do not improve or worsen or seek emergency care

## 2018-03-05 NOTE — Assessment & Plan Note (Signed)
Flare with URI/Bronchitis   Plan  Patient Instructions  Augmentin 875mg  Twice daily  For 1 week, take with food.  Prednisone taper over next week.  Continue on TRELEGY 1 puff daily , rinse after use.  Mucinex DM Twice daily  As needed  Cough/congestion .  Add ensure/protein drink Twice daily  Between meals.  Continue on oxygen 2 l/m with activity and At bedtime  .  Follow up with Dr. Lake Bells or Parrett NP in [facility]  in 6 weeks and As needed   Please contact office for sooner follow up if symptoms do not improve or worsen or seek emergency care

## 2018-03-05 NOTE — Assessment & Plan Note (Signed)
Encouraged on oxygen compliance   Plan  Patient Instructions  Augmentin 875mg  Twice daily  For 1 week, take with food.  Prednisone taper over next week.  Continue on TRELEGY 1 puff daily , rinse after use.  Mucinex DM Twice daily  As needed  Cough/congestion .  Add ensure/protein drink Twice daily  Between meals.  Continue on oxygen 2 l/m with activity and At bedtime  .  Follow up with Dr. Lake Bells or Parrett NP in [facility]  in 6 weeks and As needed   Please contact office for sooner follow up if symptoms do not improve or worsen or seek emergency care
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
## 2018-03-05 NOTE — Assessment & Plan Note (Signed)
Cont w/ ensure Sande Rives b/tw meals

## 2018-03-16 NOTE — Progress Notes (Signed)
Reviewed, agree 

## 2018-03-18 ENCOUNTER — Ambulatory Visit (INDEPENDENT_AMBULATORY_CARE_PROVIDER_SITE_OTHER): Payer: Medicare Other | Admitting: Cardiovascular Disease

## 2018-03-18 ENCOUNTER — Encounter: Payer: Self-pay | Admitting: Cardiovascular Disease

## 2018-03-18 ENCOUNTER — Other Ambulatory Visit: Payer: Self-pay | Admitting: Cardiovascular Disease

## 2018-03-18 VITALS — BP 100/68 | HR 69 | Ht 64.0 in | Wt 79.0 lb

## 2018-03-18 DIAGNOSIS — I2510 Atherosclerotic heart disease of native coronary artery without angina pectoris: Secondary | ICD-10-CM | POA: Diagnosis not present

## 2018-03-18 DIAGNOSIS — I739 Peripheral vascular disease, unspecified: Secondary | ICD-10-CM | POA: Diagnosis not present

## 2018-03-18 NOTE — Assessment & Plan Note (Signed)
Tonya Harper returns for follow-up.  I last saw her November.  She was referred by Dr. Stanford Breed.  She is now the point where she is symptomatic from her PAD.  She has a small abdominal aortic aneurysm and what appears to have been abdominal aortic occlusion by recent Dopplers Dellis Anes syndrome).  Again to get a CT angiogram to further evaluate prior to deciding whether to perform invasive angiography.  Given her aneurysm and aortoiliac disease I suspect that she probably would require surgical therapy.

## 2018-03-18 NOTE — Patient Instructions (Addendum)
Medication Instructions:  Your physician recommends that you continue on your current medications as directed. Please refer to the Current Medication list given to you today.  If you need a refill on your cardiac medications before your next appointment, please call your pharmacy.   Lab work: Your physician recommends that you return for lab work in: 1-2 WEEKS before your abdominal CTA : CBC, BMP  If you have labs (blood work) drawn today and your tests are completely normal, you will receive your results only by: Marland Kitchen MyChart Message (if you have MyChart) OR . A paper copy in the mail If you have any lab test that is abnormal or we need to change your treatment, we will call you to review the results.  Testing/Procedures: Non-Cardiac CT Angiography (CTA), is a special type of CT scan that uses a computer to produce multi-dimensional views of major blood vessels throughout the body. In CT angiography, a contrast material is injected through an IV to help visualize the blood vessels  Follow-Up: At [facility], you and your health needs are our priority.  As part of our continuing mission to provide you with exceptional heart care, we have created designated Provider Care Teams.  These Care Teams include your primary Cardiologist (physician) and Advanced Practice Providers (APPs -  Physician Assistants and Nurse Practitioners) who all work together to provide you with the care you need, when you need it. You will need a follow up appointment with Dr. Gwenlyn Found after your CTA.

## 2018-03-18 NOTE — Telephone Encounter (Signed)
Rx(s) sent to pharmacy electronically.  

## 2018-03-18 NOTE — Addendum Note (Signed)
Addended by: Annita Brod on: 03/18/2018 05:25 PM   Modules accepted: Orders

## 2018-03-18 NOTE — Progress Notes (Signed)
Ms. Ashmore returns for follow-up.  I last saw her November.  She was referred by Dr. Stanford Breed.  She is now the point where she is symptomatic from her PAD.  She has a small abdominal aortic aneurysm and what appears to have been abdominal aortic occlusion by recent Dopplers Dellis Anes syndrome).  Again to get a CT angiogram to further evaluate prior to deciding whether to perform invasive angiography.  Given her aneurysm and aortoiliac disease I suspect that she probably would require surgical therapy.  Lorretta Harp, M.D., [place], [facility], Laverta Baltimore IXL [address] [place], [place]  [address]  [phone] 03/18/2018 4:56 PM

## 2018-03-20 DIAGNOSIS — I739 Peripheral vascular disease, unspecified: Secondary | ICD-10-CM | POA: Diagnosis not present

## 2018-03-21 LAB — BASIC METABOLIC PANEL
BUN/Creatinine Ratio: 8 — ABNORMAL LOW (ref 12–28)
BUN: 6 mg/dL — ABNORMAL LOW (ref 8–27)
CO2: 26 mmol/L (ref 20–29)
Calcium: 8.5 mg/dL — ABNORMAL LOW (ref 8.7–10.3)
Chloride: 98 mmol/L (ref 96–106)
Creatinine, Ser: 0.78 mg/dL (ref 0.57–1.00)
GFR calc Af Amer: 88 mL/min/{1.73_m2} (ref >59)
GFR calc non Af Amer: 77 mL/min/{1.73_m2} (ref >59)
Glucose: 82 mg/dL (ref 65–99)
Potassium: 3.8 mmol/L (ref 3.5–5.2)
Sodium: 140 mmol/L (ref 134–144)

## 2018-03-21 LAB — CBC
Hematocrit: 32.5 % — ABNORMAL LOW (ref 34.0–46.6)
Hemoglobin: 10.3 g/dL — ABNORMAL LOW (ref 11.1–15.9)
MCH: 28.6 pg (ref 26.6–33.0)
MCHC: 31.7 g/dL (ref 31.5–35.7)
MCV: 90 fL (ref 79–97)
Platelets: 227 10*3/uL (ref 150–450)
RBC: 3.6 x10E6/uL — ABNORMAL LOW (ref 3.77–5.28)
RDW: 11.8 % (ref 11.7–15.4)
WBC: 9.7 10*3/uL (ref 3.4–10.8)

## 2018-03-24 ENCOUNTER — Ambulatory Visit (HOSPITAL_BASED_OUTPATIENT_CLINIC_OR_DEPARTMENT_OTHER): Admission: RE | Admit: 2018-03-24 | Payer: Medicare Other | Source: Ambulatory Visit

## 2018-03-24 ENCOUNTER — Encounter (HOSPITAL_BASED_OUTPATIENT_CLINIC_OR_DEPARTMENT_OTHER): Payer: Self-pay

## 2018-03-24 ENCOUNTER — Ambulatory Visit (HOSPITAL_BASED_OUTPATIENT_CLINIC_OR_DEPARTMENT_OTHER)
Admission: RE | Admit: 2018-03-24 | Discharge: 2018-03-24 | Disposition: A | Discharge status: Home / Self Care | Payer: Medicare Other | Source: Ambulatory Visit | Attending: Cardiovascular Disease | Admitting: Cardiovascular Disease

## 2018-03-24 ENCOUNTER — Other Ambulatory Visit: Payer: Self-pay

## 2018-03-24 DIAGNOSIS — I714 Abdominal aortic aneurysm, without rupture, unspecified: Secondary | ICD-10-CM

## 2018-03-24 DIAGNOSIS — I739 Peripheral vascular disease, unspecified: Secondary | ICD-10-CM | POA: Insufficient documentation

## 2018-03-24 MED ORDER — IOPAMIDOL (ISOVUE-370) INJECTION 76%
100.0000 mL | Freq: Once | INTRAVENOUS | Status: AC | PRN
  Administered 2018-03-24: 100 mL via INTRAVENOUS

## 2018-03-25 ENCOUNTER — Telehealth: Payer: Self-pay | Admitting: Cardiovascular Disease

## 2018-03-25 NOTE — Telephone Encounter (Signed)
New Message:   Patient calling concerning a CT scan on yesterday patient would like for Dr. Stanford Breed to look at it. Please call patient back.

## 2018-03-25 NOTE — Telephone Encounter (Signed)
Pt aware Dr Gwenlyn Found has not reviewed ct .Will call pt  once study reviewed with recommendations ./cy

## 2018-03-26 ENCOUNTER — Telehealth: Payer: Self-pay

## 2018-03-26 NOTE — Telephone Encounter (Signed)
Spoke with pt and advised that Dr. Gwenlyn Found would like pt to have abdominal aortogram with runoff after reviewing recent CT abdomen. Pt states she has appt scheduled for 04/01/2018 with Dr. Gwenlyn Found and would like to discuss before proceeding. Advised pt to keep appt and that message would be routed to Dr. Gwenlyn Found for review

## 2018-03-30 NOTE — Telephone Encounter (Signed)
That is fine with me.

## 2018-03-31 ENCOUNTER — Other Ambulatory Visit: Payer: Self-pay

## 2018-03-31 ENCOUNTER — Telehealth: Payer: Self-pay

## 2018-03-31 DIAGNOSIS — I739 Peripheral vascular disease, unspecified: Secondary | ICD-10-CM

## 2018-03-31 NOTE — Telephone Encounter (Signed)
Pt aware that PV procedure rescheduled for 04/13/2018. Pt aware that preparation for procedure and corresponding paperwork to be discussed in office on 3/4. Pt agreeable with this

## 2018-04-01 ENCOUNTER — Encounter: Payer: Self-pay | Admitting: Cardiovascular Disease

## 2018-04-01 ENCOUNTER — Ambulatory Visit (INDEPENDENT_AMBULATORY_CARE_PROVIDER_SITE_OTHER): Payer: Medicare Other | Admitting: Cardiovascular Disease

## 2018-04-01 DIAGNOSIS — I2510 Atherosclerotic heart disease of native coronary artery without angina pectoris: Secondary | ICD-10-CM | POA: Diagnosis not present

## 2018-04-01 DIAGNOSIS — I739 Peripheral vascular disease, unspecified: Secondary | ICD-10-CM | POA: Diagnosis not present

## 2018-04-01 MED ORDER — PREDNISONE 50 MG PO TABS: ORAL_TABLET | ORAL | 0 refills | Status: DC

## 2018-04-01 MED ORDER — DIPHENHYDRAMINE HCL 50 MG PO TABS: 50.0000 mg | ORAL_TABLET | Freq: Once | ORAL | 0 refills | Status: DC

## 2018-04-01 NOTE — Assessment & Plan Note (Signed)
Tonya Harper returns today prior to her anticipated peripheral angiogram scheduled for 04/13/2018.  She has less tolerating claudication right greater than left small abdominal aortic aneurysm and a recent recent CTA that showed severe bilateral iliac occlusive disease left greater than right with right SFA occlusive disease.  On exam she is got up palpable right common femoral pulse and absent left femoral pulse.  We will try to access her right common femoral artery and if we cannot do that we will perform radial diagnostic peripheral angiography.

## 2018-04-01 NOTE — H&P (View-Only) (Signed)
04/01/2018 Tonya Harper   Jun 22, 1945  [id number]  Primary Physician Coralyn Mark, Altamese Cabal, MD Primary Cardiologist: Lorretta Harp MD [name], [place]  HPI:  Tonya Harper is a 73 y.o.   thin-appearing divorced African-American female mother of 2, her mother for grandchildren referred by Dr. Stanford Breed for evaluation of symptomatic PAD. I last saw her in the office  03/18/2018. Her primary care provider is Dr. Coralyn Mark. She has a history of hypertension and hyperlipidemia. She smokes requires a pack a day up until 2013 (30-40 pack years). She does have a history of CAD and mild to moderate LV dysfunction. She has a small to moderate size infrarenal abdominal aortic aneurysm demonstrated by Dopplers October 2017 measuring 2.4 x 3.9 cm. She can walk only 20-30 feet and had recent Dopplers performed 12/11/15 revealing ABIs in the 0.6 range bilaterally with iliac, common femoral and superficial femoral artery disease. She wishedto initially pursue a pharmacologic approach and if unsuccessful a more aggressive approach. I began her on Pletal which did afford her some benefit.Since I saw her 3 months ago she continues to enjoy the benefits of pharmacologic therapy and wishes to put off an invasive approach at this time.  Since I saw her a year and a half ago her claudication has gotten progressively worse.  Her distal aorta is subocclusive as are both iliac arteries.  She has SFA disease as well.  She wishes not to pursue an invasive approach to define her anatomy and potential provide endovascular therapy for lifestyle limiting claudication.  I had a CT angiogram performed on her 03/24/2018 revealing small abdominal aortic aneurysm and severe bilateral iliac occlusive disease left greater than right with right SFA occlusive disease as well.  She is now ready to proceed with angiography and potential intervention.   Current Meds  Medication Sig  . acetaminophen (TYLENOL) 325 MG tablet Take  650 mg by mouth every 6 (six) hours as needed for mild pain or moderate pain. For pain  . albuterol (PROVENTIL) (2.5 MG/3ML) 0.083% nebulizer solution USE 1 AMPULE IN NEBULIZER EVERY 6 HOURS AS NEEDED FOR WHEEZING/SHORTNESS OF BREATH (Patient taking differently: Take 2.5 mg by nebulization every 6 (six) hours as needed for wheezing or shortness of breath. )  . aspirin EC 81 MG tablet Take 1 tablet (81 mg total) by mouth daily. (Patient taking differently: Take 81 mg by mouth at bedtime. )  . calcium gluconate 500 MG tablet Take 1 tablet by mouth 2 (two) times daily.  . carvedilol (COREG) 12.5 MG tablet Take 1 tablet (12.5 mg total) by mouth 2 (two) times daily with a meal.  . cilostazol (PLETAL) 50 MG tablet Take 1 tablet (50 mg total) by mouth 2 (two) times daily.  . clotrimazole (LOTRIMIN) 1 % cream Apply 1 application topically 4 (four) times daily as needed. (Patient taking differently: Apply 1 application topically 4 (four) times daily as needed (for rash). )  . Fluticasone-Umeclidin-Vilant (TRELEGY ELLIPTA) 100-62.5-25 MCG/INH AEPB Inhale 1 puff into the lungs daily.  Marland Kitchen guaiFENesin (ROBITUSSIN) 100 MG/5ML SOLN Take 15 mLs (300 mg total) by mouth every 6 (six) hours.  . lansoprazole (PREVACID) 30 MG capsule Take 30 mg by mouth 2 (two) times daily before a meal.   . levothyroxine (SYNTHROID, LEVOTHROID) 25 MCG tablet Take 25 mcg by mouth daily.   . nitroGLYCERIN (NITROSTAT) 0.4 MG SL tablet Place 1 tablet (0.4 mg total) under the tongue every 5 (five) minutes as needed for chest pain (  X3 DOSES BEFORE CALLING 911).  . pravastatin (PRAVACHOL) 20 MG tablet Take 1 tablet (20 mg total) by mouth daily.  . predniSONE (DELTASONE) 10 MG tablet Take 1 tablet (10 mg total) by mouth as needed.  . predniSONE (DELTASONE) 10 MG tablet 4 tabs for 2 days, then 3 tabs for 2 days, 2 tabs for 2 days, then 1 tab for 2 days, then stop  . UNABLE TO FIND Inhale into the lungs daily as needed (shortness of breath).  (Oxygen) 2-3 liters  . Vitamin D, Ergocalciferol, 2000 units CAPS Take 2,000 Units by mouth daily.      Allergies  Allergen Reactions  . Aspirin Swelling    nausea and dizziness after taking for one month at a time.. States her physician told her to use the 81 mg vs the 325mg . Other reaction(s): Dizziness (intolerance) nausea and dizziness after taking for one month at a time.. States her physician told her to use the 81 mg vs the 325mg .  . Pneumovax [Pneumococcal Polysaccharide Vaccine] Hives and Swelling  . Shellfish Allergy Swelling and Anaphylaxis    Medication withdrawal symptoms Medication withdrawal symptoms Medication withdrawal symptoms   . Tdap [Tetanus-Diphth-Acell Pertussis] Swelling and Rash    Social History   Socioeconomic History  . Marital status: Married    Spouse name: Not on file  . Number of children: 2  . Years of education: Not on file  . Highest education level: Not on file  Occupational History  . Occupation: unemployed    Comment: Retired  Scientific laboratory technician  . Financial resource strain: Not on file  . Food insecurity:    Worry: Not on file    Inability: Not on file  . Transportation needs:    Medical: Not on file    Non-medical: Not on file  Tobacco Use  . Smoking status: Former Smoker    Packs/day: 0.50    Years: 40.00    Pack years: 20.00    Types: Cigarettes    Last attempt to quit: 02/14/2011    Years since quitting: 7.1  . Smokeless tobacco: Never Used  Substance and Sexual Activity  . Alcohol use: Yes    Alcohol/week: 0.0 standard drinks    Comment: occ  . Drug use: No    Types: Methylphenidate    Comment: denies uses 10/10/14  . Sexual activity: Yes    Birth control/protection: Surgical  Lifestyle  . Physical activity:    Days per week: Not on file    Minutes per session: Not on file  . Stress: Not on file  Relationships  . Social connections:    Talks on phone: Not on file    Gets together: Not on file    Attends religious  service: Not on file    Active member of club or organization: Not on file    Attends meetings of clubs or organizations: Not on file    Relationship status: Not on file  . Intimate partner violence:    Fear of current or ex partner: Not on file    Emotionally abused: Not on file    Physically abused: Not on file    Forced sexual activity: Not on file  Other Topics Concern  . Not on file  Social History Narrative  . Not on file     Review of Systems: General: negative for chills, fever, night sweats or weight changes.  Cardiovascular: negative for chest pain, dyspnea on exertion, edema, orthopnea, palpitations, paroxysmal nocturnal dyspnea or shortness  of breath Dermatological: negative for rash Respiratory: negative for cough or wheezing Urologic: negative for hematuria Abdominal: negative for nausea, vomiting, diarrhea, bright red blood per rectum, melena, or hematemesis Neurologic: negative for visual changes, syncope, or dizziness All other systems reviewed and are otherwise negative except as noted above.    Blood pressure (!) 148/68, pulse 74, height 5\' 4"  (1.626 m), weight 81 lb (36.7 kg), SpO2 90 %.  General appearance: alert and no distress Neck: no adenopathy, no carotid bruit, no JVD, supple, symmetrical, trachea midline and thyroid not enlarged, symmetric, no tenderness/mass/nodules Lungs: clear to auscultation bilaterally Heart: regular rate and rhythm, S1, S2 normal, no murmur, click, rub or gallop Extremities: 1-2+ left ankle edema Pulses: 1+ right femoral, absent left femoral pulse Skin: Skin color, texture, turgor normal. No rashes or lesions Neurologic: Alert and oriented X 3, normal strength and tone. Normal symmetric reflexes. Normal coordination and gait  EKG not performed today  ASSESSMENT AND PLAN:   Peripheral arterial disease ([place]) Ms. Brandenburger returns today prior to her anticipated peripheral angiogram scheduled for 04/13/2018.  She has less  tolerating claudication right greater than left small abdominal aortic aneurysm and a recent recent CTA that showed severe bilateral iliac occlusive disease left greater than right with right SFA occlusive disease.  On exam she is got up palpable right common femoral pulse and absent left femoral pulse.  We will try to access her right common femoral artery and if we cannot do that we will perform radial diagnostic peripheral angiography.      Lorretta Harp MD FACP,FACC,FAHA, [facility] 04/01/2018 12:12 PM

## 2018-04-01 NOTE — Progress Notes (Signed)
04/01/2018 Tonya Harper   12/02/1945  [id number]  Primary Physician Coralyn Mark, Altamese Cabal, MD Primary Cardiologist: Lorretta Harp MD [name], [place]  HPI:  Tonya Harper is a 73 y.o.   thin-appearing divorced African-American female mother of 2, her mother for grandchildren referred by Dr. Stanford Breed for evaluation of symptomatic PAD. I last saw her in the office  03/18/2018. Her primary care provider is Dr. Coralyn Mark. She has a history of hypertension and hyperlipidemia. She smokes requires a pack a day up until 2013 (30-40 pack years). She does have a history of CAD and mild to moderate LV dysfunction. She has a small to moderate size infrarenal abdominal aortic aneurysm demonstrated by Dopplers October 2017 measuring 2.4 x 3.9 cm. She can walk only 20-30 feet and had recent Dopplers performed 12/11/15 revealing ABIs in the 0.6 range bilaterally with iliac, common femoral and superficial femoral artery disease. She wishedto initially pursue a pharmacologic approach and if unsuccessful a more aggressive approach. I began her on Pletal which did afford her some benefit.Since I saw her 3 months ago she continues to enjoy the benefits of pharmacologic therapy and wishes to put off an invasive approach at this time.  Since I saw her a year and a half ago her claudication has gotten progressively worse.  Her distal aorta is subocclusive as are both iliac arteries.  She has SFA disease as well.  She wishes not to pursue an invasive approach to define her anatomy and potential provide endovascular therapy for lifestyle limiting claudication.  I had a CT angiogram performed on her 03/24/2018 revealing small abdominal aortic aneurysm and severe bilateral iliac occlusive disease left greater than right with right SFA occlusive disease as well.  She is now ready to proceed with angiography and potential intervention.   Current Meds  Medication Sig  . acetaminophen (TYLENOL) 325 MG tablet Take  650 mg by mouth every 6 (six) hours as needed for mild pain or moderate pain. For pain  . albuterol (PROVENTIL) (2.5 MG/3ML) 0.083% nebulizer solution USE 1 AMPULE IN NEBULIZER EVERY 6 HOURS AS NEEDED FOR WHEEZING/SHORTNESS OF BREATH (Patient taking differently: Take 2.5 mg by nebulization every 6 (six) hours as needed for wheezing or shortness of breath. )  . aspirin EC 81 MG tablet Take 1 tablet (81 mg total) by mouth daily. (Patient taking differently: Take 81 mg by mouth at bedtime. )  . calcium gluconate 500 MG tablet Take 1 tablet by mouth 2 (two) times daily.  . carvedilol (COREG) 12.5 MG tablet Take 1 tablet (12.5 mg total) by mouth 2 (two) times daily with a meal.  . cilostazol (PLETAL) 50 MG tablet Take 1 tablet (50 mg total) by mouth 2 (two) times daily.  . clotrimazole (LOTRIMIN) 1 % cream Apply 1 application topically 4 (four) times daily as needed. (Patient taking differently: Apply 1 application topically 4 (four) times daily as needed (for rash). )  . Fluticasone-Umeclidin-Vilant (TRELEGY ELLIPTA) 100-62.5-25 MCG/INH AEPB Inhale 1 puff into the lungs daily.  Marland Kitchen guaiFENesin (ROBITUSSIN) 100 MG/5ML SOLN Take 15 mLs (300 mg total) by mouth every 6 (six) hours.  . lansoprazole (PREVACID) 30 MG capsule Take 30 mg by mouth 2 (two) times daily before a meal.   . levothyroxine (SYNTHROID, LEVOTHROID) 25 MCG tablet Take 25 mcg by mouth daily.   . nitroGLYCERIN (NITROSTAT) 0.4 MG SL tablet Place 1 tablet (0.4 mg total) under the tongue every 5 (five) minutes as needed for chest pain (  X3 DOSES BEFORE CALLING 911).  . pravastatin (PRAVACHOL) 20 MG tablet Take 1 tablet (20 mg total) by mouth daily.  . predniSONE (DELTASONE) 10 MG tablet Take 1 tablet (10 mg total) by mouth as needed.  . predniSONE (DELTASONE) 10 MG tablet 4 tabs for 2 days, then 3 tabs for 2 days, 2 tabs for 2 days, then 1 tab for 2 days, then stop  . UNABLE TO FIND Inhale into the lungs daily as needed (shortness of breath).  (Oxygen) 2-3 liters  . Vitamin D, Ergocalciferol, 2000 units CAPS Take 2,000 Units by mouth daily.      Allergies  Allergen Reactions  . Aspirin Swelling    nausea and dizziness after taking for one month at a time.. States her physician told her to use the 81 mg vs the 325mg . Other reaction(s): Dizziness (intolerance) nausea and dizziness after taking for one month at a time.. States her physician told her to use the 81 mg vs the 325mg .  . Pneumovax [Pneumococcal Polysaccharide Vaccine] Hives and Swelling  . Shellfish Allergy Swelling and Anaphylaxis    Medication withdrawal symptoms Medication withdrawal symptoms Medication withdrawal symptoms   . Tdap [Tetanus-Diphth-Acell Pertussis] Swelling and Rash    Social History   Socioeconomic History  . Marital status: Married    Spouse name: Not on file  . Number of children: 2  . Years of education: Not on file  . Highest education level: Not on file  Occupational History  . Occupation: unemployed    Comment: Retired  Scientific laboratory technician  . Financial resource strain: Not on file  . Food insecurity:    Worry: Not on file    Inability: Not on file  . Transportation needs:    Medical: Not on file    Non-medical: Not on file  Tobacco Use  . Smoking status: Former Smoker    Packs/day: 0.50    Years: 40.00    Pack years: 20.00    Types: Cigarettes    Last attempt to quit: 02/14/2011    Years since quitting: 7.1  . Smokeless tobacco: Never Used  Substance and Sexual Activity  . Alcohol use: Yes    Alcohol/week: 0.0 standard drinks    Comment: occ  . Drug use: No    Types: Methylphenidate    Comment: denies uses 10/10/14  . Sexual activity: Yes    Birth control/protection: Surgical  Lifestyle  . Physical activity:    Days per week: Not on file    Minutes per session: Not on file  . Stress: Not on file  Relationships  . Social connections:    Talks on phone: Not on file    Gets together: Not on file    Attends religious  service: Not on file    Active member of club or organization: Not on file    Attends meetings of clubs or organizations: Not on file    Relationship status: Not on file  . Intimate partner violence:    Fear of current or ex partner: Not on file    Emotionally abused: Not on file    Physically abused: Not on file    Forced sexual activity: Not on file  Other Topics Concern  . Not on file  Social History Narrative  . Not on file     Review of Systems: General: negative for chills, fever, night sweats or weight changes.  Cardiovascular: negative for chest pain, dyspnea on exertion, edema, orthopnea, palpitations, paroxysmal nocturnal dyspnea or shortness  of breath Dermatological: negative for rash Respiratory: negative for cough or wheezing Urologic: negative for hematuria Abdominal: negative for nausea, vomiting, diarrhea, bright red blood per rectum, melena, or hematemesis Neurologic: negative for visual changes, syncope, or dizziness All other systems reviewed and are otherwise negative except as noted above.    Blood pressure (!) 148/68, pulse 74, height 5\' 4"  (1.626 m), weight 81 lb (36.7 kg), SpO2 90 %.  General appearance: alert and no distress Neck: no adenopathy, no carotid bruit, no JVD, supple, symmetrical, trachea midline and thyroid not enlarged, symmetric, no tenderness/mass/nodules Lungs: clear to auscultation bilaterally Heart: regular rate and rhythm, S1, S2 normal, no murmur, click, rub or gallop Extremities: 1-2+ left ankle edema Pulses: 1+ right femoral, absent left femoral pulse Skin: Skin color, texture, turgor normal. No rashes or lesions Neurologic: Alert and oriented X 3, normal strength and tone. Normal symmetric reflexes. Normal coordination and gait  EKG not performed today  ASSESSMENT AND PLAN:   Peripheral arterial disease ([place]) Ms. Seivert returns today prior to her anticipated peripheral angiogram scheduled for 04/13/2018.  She has less  tolerating claudication right greater than left small abdominal aortic aneurysm and a recent recent CTA that showed severe bilateral iliac occlusive disease left greater than right with right SFA occlusive disease.  On exam she is got up palpable right common femoral pulse and absent left femoral pulse.  We will try to access her right common femoral artery and if we cannot do that we will perform radial diagnostic peripheral angiography.      Lorretta Harp MD FACP,FACC,FAHA, [facility] 04/01/2018 12:12 PM

## 2018-04-01 NOTE — Patient Instructions (Addendum)
    [place] [address] Dept: [phone] Loc: [place]  04/01/2018  You are scheduled for a Peripheral Angiogram on Monday, March 16 with Dr. Quay Burow.  1. Please arrive at the [facility] (Main Entrance A) at [facility]: [address] [place], [place] [address] at 7:30 AM (This time is two hours before your procedure to ensure your preparation). Free valet parking service is available.   Special note: Every effort is made to have your procedure done on time. Please understand that emergencies sometimes delay scheduled procedures.  2. Diet: Do not eat solid foods after midnight.  The patient may have clear liquids until 5am upon the day of the procedure.  3. Labs: You had blood drawn on on 03/20/2018 for CBC and BMP  4. Medication instructions in preparation for your procedure:   Contrast Allergy: Yes, Please take Prednisone 50mg  by mouth at: Thirteen hours prior to your arrival time of 7:30am on 04/13/2018 for your peripheral angiogram procedure Seven hours prior to your arrival time of 7:30am on 04/13/2018 for your peripheral angiogram procedure And prior to leaving home please take last dose of Prednisone 50mg  and Benadryl 50mg  by mouth.    On the morning of your procedure, take your Aspirin and any morning medicines NOT listed above.  You may use sips of water.  5. Plan for one night stay--bring personal belongings. 6. Bring a current list of your medications and current insurance cards. 7. You MUST have a responsible person to drive you home. 8. Someone MUST be with you the first 24 hours after you arrive home or your discharge will be delayed. 9. Please wear clothes that are easy to get on and off and wear slip-on shoes.   Testing/Procedures: Your physician has requested that you have an aorta/iliac duplex. During this test, an  ultrasound is used to evaluate blood flow to the aorta and iliac arteries. Allow one hour for this exam. Do not eat after midnight the day before and avoid carbonated beverages. SCHEDULE 2-3 WEEKS AFTER YOUR PROCEDURE  Your physician has requested that you have an ankle brachial index (ABI). During this test an ultrasound and blood pressure cuff are used to evaluate the arteries that supply the arms and legs with blood. Allow thirty minutes for this exam. There are no restrictions or special instructions. SCHEDULE 2-3 WEEKS AFTER YOUR PROCEDURE   Follow-Up: At [facility], you and your health needs are our priority.  As part of our continuing mission to provide you with exceptional heart care, we have created designated Provider Care Teams.  These Care Teams include your primary Cardiologist (physician) and Advanced Practice Providers (APPs -  Physician Assistants and Nurse Practitioners) who all work together to provide you with the care you need, when you need it. You will need a follow up appointment 3 weeks after your procedure.    Thank you for allowing [place] to care for you!   -- [place] [facility]

## 2018-04-06 DIAGNOSIS — E559 Vitamin D deficiency, unspecified: Secondary | ICD-10-CM | POA: Diagnosis not present

## 2018-04-06 DIAGNOSIS — M810 Age-related osteoporosis without current pathological fracture: Secondary | ICD-10-CM | POA: Diagnosis not present

## 2018-04-06 DIAGNOSIS — R5383 Other fatigue: Secondary | ICD-10-CM | POA: Diagnosis not present

## 2018-04-09 ENCOUNTER — Telehealth: Payer: Self-pay | Admitting: *Deleted

## 2018-04-09 NOTE — Telephone Encounter (Signed)
Pt contacted pre-PV procedure scheduled at [facility] for: Monday April 13, 2018 9:30 AM Verified arrival time and place: [place] Entrance A at: 7:30 AM  No solid food after midnight prior to cath, clear liquids until 5 AM day of procedure.  Contrast allergy: yes-13 hour Prednisone and Benadryl Prep reviewed with patient. 04/12/18 8:30 PM Prednisone 50 mg 04/13/18 2:30 AM Prednisone 50 mg 04/13/18 AM prior to leaving for hospital-Prednisone 50 mg and benadryl 50 mg   AM meds can be  taken pre-cath with sip of water including: ASA 81 mg  Confirmed patient has responsible person to drive home post procedure and observe 24 hours after arriving home: yes

## 2018-04-13 ENCOUNTER — Encounter (HOSPITAL_COMMUNITY)
Admission: RE | Disposition: A | Discharge status: Home / Self Care | Payer: Self-pay | Source: Home / Self Care | Attending: Cardiovascular Disease

## 2018-04-13 ENCOUNTER — Ambulatory Visit (HOSPITAL_COMMUNITY)
Admission: RE | Admit: 2018-04-13 | Discharge: 2018-04-14 | Disposition: A | Discharge status: Home / Self Care | Payer: Medicare Other | Attending: Cardiovascular Disease | Admitting: Cardiovascular Disease

## 2018-04-13 ENCOUNTER — Encounter (HOSPITAL_COMMUNITY): Payer: Self-pay

## 2018-04-13 ENCOUNTER — Other Ambulatory Visit: Payer: Self-pay

## 2018-04-13 DIAGNOSIS — I739 Peripheral vascular disease, unspecified: Secondary | ICD-10-CM | POA: Diagnosis present

## 2018-04-13 DIAGNOSIS — I10 Essential (primary) hypertension: Secondary | ICD-10-CM | POA: Insufficient documentation

## 2018-04-13 DIAGNOSIS — Z79899 Other long term (current) drug therapy: Secondary | ICD-10-CM | POA: Insufficient documentation

## 2018-04-13 DIAGNOSIS — Z87891 Personal history of nicotine dependence: Secondary | ICD-10-CM | POA: Diagnosis not present

## 2018-04-13 DIAGNOSIS — Z7982 Long term (current) use of aspirin: Secondary | ICD-10-CM | POA: Insufficient documentation

## 2018-04-13 DIAGNOSIS — Z887 Allergy status to serum and vaccine status: Secondary | ICD-10-CM | POA: Insufficient documentation

## 2018-04-13 DIAGNOSIS — E785 Hyperlipidemia, unspecified: Secondary | ICD-10-CM | POA: Diagnosis not present

## 2018-04-13 DIAGNOSIS — I714 Abdominal aortic aneurysm, without rupture: Secondary | ICD-10-CM | POA: Diagnosis not present

## 2018-04-13 DIAGNOSIS — I2510 Atherosclerotic heart disease of native coronary artery without angina pectoris: Secondary | ICD-10-CM | POA: Insufficient documentation

## 2018-04-13 DIAGNOSIS — I70213 Atherosclerosis of native arteries of extremities with intermittent claudication, bilateral legs: Secondary | ICD-10-CM | POA: Diagnosis not present

## 2018-04-13 DIAGNOSIS — Z79890 Hormone replacement therapy (postmenopausal): Secondary | ICD-10-CM | POA: Diagnosis not present

## 2018-04-13 DIAGNOSIS — Z886 Allergy status to analgesic agent status: Secondary | ICD-10-CM | POA: Insufficient documentation

## 2018-04-13 DIAGNOSIS — Z7951 Long term (current) use of inhaled steroids: Secondary | ICD-10-CM | POA: Diagnosis not present

## 2018-04-13 HISTORY — PX: ABDOMINAL AORTOGRAM W/LOWER EXTREMITY: CATH118223

## 2018-04-13 HISTORY — DX: Peripheral vascular disease, unspecified: I73.9

## 2018-04-13 SURGERY — ABDOMINAL AORTOGRAM W/LOWER EXTREMITY
Anesthesia: LOCAL | Laterality: Bilateral

## 2018-04-13 MED ORDER — UMECLIDINIUM BROMIDE 62.5 MCG/INH IN AEPB
1.0000 | INHALATION_SPRAY | Freq: Every day | RESPIRATORY_TRACT | Status: DC
  Administered 2018-04-14: 1 via RESPIRATORY_TRACT
  Filled 2018-04-13: qty 7

## 2018-04-13 MED ORDER — HYDRALAZINE HCL 20 MG/ML IJ SOLN
5.0000 mg | INTRAMUSCULAR | Status: DC | PRN
  Administered 2018-04-13: 12:00:00 5 mg via INTRAVENOUS
  Filled 2018-04-13: qty 1

## 2018-04-13 MED ORDER — PRAVASTATIN SODIUM 10 MG PO TABS
20.0000 mg | ORAL_TABLET | Freq: Every day | ORAL | Status: DC
  Administered 2018-04-13 – 2018-04-14 (×2): 20 mg via ORAL
  Filled 2018-04-13 (×2): qty 2

## 2018-04-13 MED ORDER — DIPHENHYDRAMINE HCL 50 MG/ML IJ SOLN
INTRAMUSCULAR | Status: DC | PRN
  Administered 2018-04-13: 25 mg via INTRAVENOUS

## 2018-04-13 MED ORDER — SODIUM CHLORIDE 0.9% FLUSH
3.0000 mL | Freq: Two times a day (BID) | INTRAVENOUS | Status: DC
  Administered 2018-04-13: 3 mL via INTRAVENOUS

## 2018-04-13 MED ORDER — MIDAZOLAM HCL 2 MG/2ML IJ SOLN
INTRAMUSCULAR | Status: AC
  Filled 2018-04-13: qty 2

## 2018-04-13 MED ORDER — LEVOTHYROXINE SODIUM 25 MCG PO TABS
25.0000 ug | ORAL_TABLET | Freq: Every day | ORAL | Status: DC
  Administered 2018-04-14: 07:00:00 25 ug via ORAL
  Filled 2018-04-13: qty 1

## 2018-04-13 MED ORDER — METHYLPREDNISOLONE SODIUM SUCC 125 MG IJ SOLR
INTRAMUSCULAR | Status: DC | PRN
  Administered 2018-04-13: 125 mg via INTRAVENOUS

## 2018-04-13 MED ORDER — SODIUM CHLORIDE 0.9% FLUSH: 3.0000 mL | INTRAVENOUS | Status: DC | PRN

## 2018-04-13 MED ORDER — FENTANYL CITRATE (PF) 100 MCG/2ML IJ SOLN
INTRAMUSCULAR | Status: DC | PRN
  Administered 2018-04-13: 25 ug via INTRAVENOUS

## 2018-04-13 MED ORDER — ACETAMINOPHEN 325 MG PO TABS
650.0000 mg | ORAL_TABLET | ORAL | Status: DC | PRN
  Administered 2018-04-13 – 2018-04-14 (×2): 650 mg via ORAL
  Filled 2018-04-13 (×2): qty 2

## 2018-04-13 MED ORDER — LABETALOL HCL 5 MG/ML IV SOLN: 10.0000 mg | INTRAVENOUS | Status: DC | PRN

## 2018-04-13 MED ORDER — PANTOPRAZOLE SODIUM 20 MG PO TBEC
20.0000 mg | DELAYED_RELEASE_TABLET | Freq: Every day | ORAL | Status: DC
  Administered 2018-04-14: 20 mg via ORAL
  Filled 2018-04-13: qty 1

## 2018-04-13 MED ORDER — ONDANSETRON HCL 4 MG/2ML IJ SOLN: 4.0000 mg | Freq: Four times a day (QID) | INTRAMUSCULAR | Status: DC | PRN

## 2018-04-13 MED ORDER — METHYLPREDNISOLONE SODIUM SUCC 125 MG IJ SOLR
INTRAMUSCULAR | Status: AC
  Filled 2018-04-13: qty 2

## 2018-04-13 MED ORDER — LIDOCAINE HCL (PF) 1 % IJ SOLN
INTRAMUSCULAR | Status: DC | PRN
  Administered 2018-04-13: 28 mL

## 2018-04-13 MED ORDER — ASPIRIN 81 MG PO CHEW: 81.0000 mg | CHEWABLE_TABLET | ORAL | Status: DC

## 2018-04-13 MED ORDER — SODIUM CHLORIDE 0.9 % WEIGHT BASED INFUSION: 1.0000 mL/kg/h | INTRAVENOUS | Status: DC

## 2018-04-13 MED ORDER — NITROGLYCERIN 0.4 MG SL SUBL: 0.4000 mg | SUBLINGUAL_TABLET | SUBLINGUAL | Status: DC | PRN

## 2018-04-13 MED ORDER — HEPARIN (PORCINE) IN NACL 1000-0.9 UT/500ML-% IV SOLN
INTRAVENOUS | Status: DC | PRN
  Administered 2018-04-13 (×2): 500 mL

## 2018-04-13 MED ORDER — FLUTICASONE-UMECLIDIN-VILANT 100-62.5-25 MCG/INH IN AEPB: 1.0000 | INHALATION_SPRAY | Freq: Every day | RESPIRATORY_TRACT | Status: DC

## 2018-04-13 MED ORDER — ALBUTEROL SULFATE (2.5 MG/3ML) 0.083% IN NEBU: 2.5000 mg | INHALATION_SOLUTION | Freq: Four times a day (QID) | RESPIRATORY_TRACT | Status: DC | PRN

## 2018-04-13 MED ORDER — SODIUM CHLORIDE 0.9 % IV SOLN: 250.0000 mL | INTRAVENOUS | Status: DC | PRN

## 2018-04-13 MED ORDER — HEPARIN (PORCINE) IN NACL 1000-0.9 UT/500ML-% IV SOLN
INTRAVENOUS | Status: AC
  Filled 2018-04-13: qty 1000

## 2018-04-13 MED ORDER — SODIUM CHLORIDE 0.9 % IV SOLN
INTRAVENOUS | Status: AC
  Administered 2018-04-13: 12:00:00 via INTRAVENOUS

## 2018-04-13 MED ORDER — ALBUTEROL SULFATE (2.5 MG/3ML) 0.083% IN NEBU: 3.0000 mL | INHALATION_SOLUTION | Freq: Four times a day (QID) | RESPIRATORY_TRACT | Status: DC | PRN

## 2018-04-13 MED ORDER — SODIUM CHLORIDE 0.9% FLUSH: 3.0000 mL | Freq: Two times a day (BID) | INTRAVENOUS | Status: DC

## 2018-04-13 MED ORDER — FLUTICASONE FUROATE-VILANTEROL 100-25 MCG/INH IN AEPB
1.0000 | INHALATION_SPRAY | Freq: Every day | RESPIRATORY_TRACT | Status: DC
  Administered 2018-04-14: 1 via RESPIRATORY_TRACT
  Filled 2018-04-13: qty 28

## 2018-04-13 MED ORDER — MORPHINE SULFATE (PF) 2 MG/ML IV SOLN: 2.0000 mg | INTRAVENOUS | Status: DC | PRN

## 2018-04-13 MED ORDER — LIDOCAINE HCL (PF) 1 % IJ SOLN
INTRAMUSCULAR | Status: AC
  Filled 2018-04-13: qty 30

## 2018-04-13 MED ORDER — IODIXANOL 320 MG/ML IV SOLN
INTRAVENOUS | Status: DC | PRN
  Administered 2018-04-13: 165 mL via INTRA_ARTERIAL

## 2018-04-13 MED ORDER — SODIUM CHLORIDE 0.9 % WEIGHT BASED INFUSION
3.0000 mL/kg/h | INTRAVENOUS | Status: DC
  Administered 2018-04-13: 3 mL/kg/h via INTRAVENOUS

## 2018-04-13 MED ORDER — GUAIFENESIN 100 MG/5ML PO SOLN
15.0000 mL | Freq: Four times a day (QID) | ORAL | Status: DC
  Administered 2018-04-13 – 2018-04-14 (×3): 300 mg via ORAL
  Filled 2018-04-13 (×6): qty 15

## 2018-04-13 MED ORDER — CARVEDILOL 12.5 MG PO TABS
12.5000 mg | ORAL_TABLET | Freq: Two times a day (BID) | ORAL | Status: DC
  Administered 2018-04-13 – 2018-04-14 (×2): 12.5 mg via ORAL
  Filled 2018-04-13 (×3): qty 1

## 2018-04-13 MED ORDER — FENTANYL CITRATE (PF) 100 MCG/2ML IJ SOLN
INTRAMUSCULAR | Status: AC
  Filled 2018-04-13: qty 2

## 2018-04-13 MED ORDER — DIPHENHYDRAMINE HCL 50 MG/ML IJ SOLN
INTRAMUSCULAR | Status: AC
  Filled 2018-04-13: qty 1

## 2018-04-13 MED ORDER — MIDAZOLAM HCL 2 MG/2ML IJ SOLN
INTRAMUSCULAR | Status: DC | PRN
  Administered 2018-04-13: 1 mg via INTRAVENOUS

## 2018-04-13 SURGICAL SUPPLY — 11 items
CATH ANGIO 5F PIGTAIL 65CM (CATHETERS) ×2 IMPLANT
CATH CROSS OVER TEMPO 5F (CATHETERS) ×2 IMPLANT
KIT PV (KITS) ×2 IMPLANT
SHEATH PINNACLE 5F 10CM (SHEATH) ×2 IMPLANT
SHEATH PROBE COVER 6X72 (BAG) ×2 IMPLANT
STOPCOCK MORSE 400PSI 3WAY (MISCELLANEOUS) ×2 IMPLANT
SYR MEDRAD MARK 7 150ML (SYRINGE) ×2 IMPLANT
TRANSDUCER W/STOPCOCK (MISCELLANEOUS) ×2 IMPLANT
TRAY PV CATH (CUSTOM PROCEDURE TRAY) ×2 IMPLANT
TUBING CIL FLEX 10 FLL-RA (TUBING) ×2 IMPLANT
WIRE HITORQ VERSACORE ST 145CM (WIRE) ×2 IMPLANT

## 2018-04-13 NOTE — Interval H&P Note (Signed)
History and Physical Interval Note:  04/13/2018 10:18 AM  Tonya Harper  has presented today for surgery, with the diagnosis of Peripheral Arterial Disease.  The various methods of treatment have been discussed with the patient and family. After consideration of risks, benefits and other options for treatment, the patient has consented to  Procedure(s): ABDOMINAL AORTOGRAM W/LOWER EXTREMITY (Bilateral) as a surgical intervention.  The patient's history has been reviewed, patient examined, no change in status, stable for surgery.  I have reviewed the patient's chart and labs.  Questions were answered to the patient's satisfaction.     Quay Burow

## 2018-04-14 ENCOUNTER — Other Ambulatory Visit: Payer: Self-pay | Admitting: Cardiology

## 2018-04-14 DIAGNOSIS — I70213 Atherosclerosis of native arteries of extremities with intermittent claudication, bilateral legs: Secondary | ICD-10-CM | POA: Diagnosis not present

## 2018-04-14 DIAGNOSIS — E785 Hyperlipidemia, unspecified: Secondary | ICD-10-CM | POA: Diagnosis not present

## 2018-04-14 DIAGNOSIS — I10 Essential (primary) hypertension: Secondary | ICD-10-CM | POA: Diagnosis not present

## 2018-04-14 DIAGNOSIS — I739 Peripheral vascular disease, unspecified: Secondary | ICD-10-CM | POA: Diagnosis not present

## 2018-04-14 DIAGNOSIS — Z87891 Personal history of nicotine dependence: Secondary | ICD-10-CM | POA: Diagnosis not present

## 2018-04-14 DIAGNOSIS — I714 Abdominal aortic aneurysm, without rupture: Secondary | ICD-10-CM | POA: Diagnosis not present

## 2018-04-14 DIAGNOSIS — I493 Ventricular premature depolarization: Secondary | ICD-10-CM

## 2018-04-14 DIAGNOSIS — I2510 Atherosclerotic heart disease of native coronary artery without angina pectoris: Secondary | ICD-10-CM | POA: Diagnosis not present

## 2018-04-14 LAB — CBC
HCT: 29.8 % — ABNORMAL LOW (ref 36.0–46.0)
Hemoglobin: 9.5 g/dL — ABNORMAL LOW (ref 12.0–15.0)
MCH: 29.1 pg (ref 26.0–34.0)
MCHC: 31.9 g/dL (ref 30.0–36.0)
MCV: 91.4 fL (ref 80.0–100.0)
Platelets: 206 10*3/uL (ref 150–400)
RBC: 3.26 MIL/uL — ABNORMAL LOW (ref 3.87–5.11)
RDW: 14.5 % (ref 11.5–15.5)
WBC: 14.2 10*3/uL — ABNORMAL HIGH (ref 4.0–10.5)
nRBC: 0 % (ref 0.0–0.2)

## 2018-04-14 LAB — BASIC METABOLIC PANEL
Anion gap: 8 (ref 5–15)
BUN: 14 mg/dL (ref 8–23)
CO2: 27 mmol/L (ref 22–32)
Calcium: 8.6 mg/dL — ABNORMAL LOW (ref 8.9–10.3)
Chloride: 105 mmol/L (ref 98–111)
Creatinine, Ser: 0.97 mg/dL (ref 0.44–1.00)
GFR calc Af Amer: >60 mL/min (ref >60)
GFR calc non Af Amer: 59 mL/min — ABNORMAL LOW (ref >60)
Glucose, Bld: 117 mg/dL — ABNORMAL HIGH (ref 70–99)
Potassium: 3.3 mmol/L — ABNORMAL LOW (ref 3.5–5.1)
Sodium: 140 mmol/L (ref 135–145)

## 2018-04-14 MED ORDER — POTASSIUM CHLORIDE CRYS ER 20 MEQ PO TBCR
40.0000 meq | EXTENDED_RELEASE_TABLET | Freq: Once | ORAL | Status: AC
  Administered 2018-04-14: 07:00:00 40 meq via ORAL
  Filled 2018-04-14: qty 2

## 2018-04-14 MED ORDER — CARVEDILOL 12.5 MG PO TABS: 25.0000 mg | ORAL_TABLET | Freq: Two times a day (BID) | ORAL | Status: DC

## 2018-04-14 MED ORDER — CARVEDILOL 25 MG PO TABS: 25.0000 mg | ORAL_TABLET | Freq: Two times a day (BID) | ORAL | 2 refills | Status: DC

## 2018-04-14 MED ORDER — CARVEDILOL 12.5 MG PO TABS
12.5000 mg | ORAL_TABLET | Freq: Once | ORAL | Status: AC
  Administered 2018-04-14: 12.5 mg via ORAL
  Filled 2018-04-14: qty 1

## 2018-04-14 NOTE — Progress Notes (Signed)
Pt with 21 runs VT @5 :37 am; asymptomatic; V/S stable. K=3.3. NP Mancel Bale informed. Potassium 40 meq po given as ordered.

## 2018-04-14 NOTE — Discharge Summary (Addendum)
Discharge Summary    Patient ID: Tonya Harper,  MRN: [id number], DOB/AGE: Feb 25, 1945 73 y.o.  Admit date: 04/13/2018 Discharge date: 04/14/2018  Primary Care Provider: Lanice Shirts Primary Cardiologist: Dr. Daleen Snook PV  Discharge Diagnoses    Active Problems:   Peripheral arterial disease ([place])   Claudication in peripheral vascular disease (HCC)   Allergies Allergies  Allergen Reactions   Aspirin Swelling    nausea and dizziness after taking for one month at a time.. Tolerates the 81 mg    Pneumovax [Pneumococcal Polysaccharide Vaccine] Hives and Swelling   Shellfish Allergy Anaphylaxis and Swelling    Medication withdrawal symptoms     Ibuprofen Nausea And Vomiting    dizziness   Tdap [Tetanus-Diphth-Acell Pertussis] Swelling and Rash    Diagnostic Studies/Procedures    PV Angiogram: 04/13/2018  Angiographic Data:   1: Abdominal aorta- the renal arteries widely patent.  The infrarenal abdominal aorta appears moderately atherosclerotic.  There was a hint of aneurysmal dilatation of the descending thoracic aorta which will need to be further evaluated 2: Left lower extremity- 95% proximal tandem left external iliac artery stenoses followed by a long 80% segmental left extra iliac artery stenosis.  95% ostial profundus stenosis on the left, 99% segmental proximal left SFA with short segment occlusion mid left SFA with one-vessel runoff via the anterior tibial 3: Right lower extremity- 50 to 60% segmental right common iliac artery stenosis, 90% right common femoral artery stenosis, 90% ostial right profunda stenosis, long CTO right SFA beginning at the origin and reconstituting in the adductor canal by profunda femoris collaterals with two-vessel runoff.  The posterior tibial was occluded.  IMPRESSION: Tonya Harper has severe bilateral multilevel disease involving her iliac arteries, profunda, and SFAs.  There is no good endovascular option.  Given  the fact that she was almost obtunded with her premeds I am going to keep her overnight for observation and hydration I will send her home tomorrow morning.  I will see her back in the office in several weeks to discuss her options.  The sheath was removed in the lab and pressure held.  The patient left lab in stable condition.   Quay Burow. MD, [facility]  _____________   History of Present Illness    Tonya Harper is a 73 y.o.female who was referred by Dr. Stanford Breed for evaluation of symptomatic PAD.Her primary care provider is Dr. Coralyn Mark. She has a history of hypertension and hyperlipidemia. She smokes requires a pack a day up until 2013 (30-40 pack years). She does have a history of nonobstructive CAD and mild to moderate LV dysfunction, now recovered. She has a small to moderate size infrarenal abdominal aortic aneurysm demonstrated by Dopplers October 2017 measuring 2.4 x 3.9 cm. She could walk only 20-30 feet and had recent Dopplers performed 12/11/15 revealing ABIs in the 0.6 range bilaterally with iliac, common femoral and superficial femoral artery disease. She wishedto initially pursue a pharmacologic approach and if unsuccessful a more aggressive approach. She was started on Pletal which did afford her some benefit.  Since last seen in the office about a year and a half ago her claudication has gotten progressively worse. Her distal aorta was noted subocclusive as are both iliac arteries. She has SFA disease as well. She wished not to pursue an invasive approach to define her anatomy and potential provide endovascular therapy for lifestyle limiting claudication. A CT angiogram performed on her 03/24/2018 revealing small abdominal aortic aneurysm and severe bilateral iliac  occlusive disease left greater than right with right SFA occlusive disease as well.  Given this finding she was agreeable to undergo PV angiogram.    Hospital Course     Underwent PV angiogram noted above with  severe bilateral multivessel disease involving the iliac, profunda and SFAs. It was felt there was no good targets for endovascular intervention. She was given several pre-meds for case and very lethargic post procedure. Admitted for observation overnight. Morning labs with K+ 3.3, supplemented, WBC 14.2 though on chronic prednisone. On telemetry noted to have ectopy with 20 beat run of VT, though asymptomatic. In talking with patient, she reported having episodes at home of palpitations at times. Will further increase Coreg to 25mg  BID. Order outpatient cardiac monitor.   General: Thin, frail older AA female appearing in no acute distress. Head: Normocephalic, atraumatic.  Neck: Supple without bruits, JVD. Lungs:  Resp regular and unlabored, CTA. Heart: RRR, S1, S2, no murmur; no rub. Abdomen: Soft, non-tender, non-distended with normoactive bowel sounds.  Extremities: No clubbing, cyanosis, edema. Distal pedal pulses are 2+ bilaterally. Right femoral cath site stable without bruising or hematoma Neuro: Alert and oriented X 3. Moves all extremities spontaneously. Psych: Normal affect.  Tonya Harper was seen by Dr. Burt Knack and determined stable for discharge home. Follow up in the office has been arranged. Medications are listed below.   _____________  Discharge Vitals Blood pressure (!) 143/79, pulse 70, temperature 98.3 F (36.8 C), temperature source Oral, resp. rate 18, height 5\' 4"  (1.626 m), weight 38.7 kg, SpO2 94 %.  Filed Weights   04/13/18 0735 04/14/18 0627  Weight: 38.1 kg 38.7 kg    Labs & Radiologic Studies    CBC Recent Labs    04/14/18 0403  WBC 14.2*  HGB 9.5*  HCT 29.8*  MCV 91.4  PLT 720   Basic Metabolic Panel Recent Labs    04/14/18 0403  NA 140  K 3.3*  CL 105  CO2 27  GLUCOSE 117*  BUN 14  CREATININE 0.97  CALCIUM 8.6*   Liver Function Tests No results for input(s): AST, ALT, ALKPHOS, BILITOT, PROT, ALBUMIN in the last 72 hours. No results for  input(s): LIPASE, AMYLASE in the last 72 hours. Cardiac Enzymes No results for input(s): CKTOTAL, CKMB, CKMBINDEX, TROPONINI in the last 72 hours. BNP Invalid input(s): POCBNP D-Dimer No results for input(s): DDIMER in the last 72 hours. Hemoglobin A1C No results for input(s): HGBA1C in the last 72 hours. Fasting Lipid Panel No results for input(s): CHOL, HDL, LDLCALC, TRIG, CHOLHDL, LDLDIRECT in the last 72 hours. Thyroid Function Tests No results for input(s): TSH, T4TOTAL, T3FREE, THYROIDAB in the last 72 hours.  Invalid input(s): FREET3 _____________  Ct Angio Abd/pel W/ And/or W/o  Result Date: 03/24/2018 CLINICAL DATA:  Follow-up abdominal aortic aneurysm EXAM: CTA ABDOMEN AND PELVIS WITH CONTRAST TECHNIQUE: Multidetector CT imaging of the abdomen and pelvis was performed using the standard protocol during bolus administration of intravenous contrast. Multiplanar reconstructed images and MIPs were obtained and reviewed to evaluate the vascular anatomy. CONTRAST:  142mL ISOVUE-370 IOPAMIDOL (ISOVUE-370) INJECTION 76% COMPARISON:  Ultrasound 12/06/2016, CT 03/15/2013 FINDINGS: VASCULAR Aorta: Partially calcified atheromatous plaque in the visualized distal descending thoracic segment. Ectatic proximal abdominal aorta measures 3.2 cm maximum transverse diameter at the level of the celiac axis origin (previously 3.1 cm on 03/15/2013), tapering to 2.1 cm at the level of the renal artery origins. Mildly ectatic infrarenal segment 2.2 cm with some eccentric nonocclusive intraluminal mural thrombus.  No dissection or stenosis. Celiac: Patent without evidence of aneurysm, dissection, vasculitis or significant stenosis. SMA: Patent without evidence of aneurysm, dissection, vasculitis or significant stenosis. Renals: Duplicated left, superior dominant, both patent. Single right, with partially calcified ostial plaque resulting in short segment stenosis of at least mild severity, patent distally. IMA:  Origin occlusion Inflow: On the right, heavily calcified plaque through the iliac arterial system with stenosis of at least mild severity in the distal common iliac. On the left, heavily calcified plaque through the common iliac. High-grade short-segment stenosis in the proximal external iliac, with tandem areas of at least mild stenosis distally. Proximal Outflow: Origin occlusion of the right SFA. high-grade stenosis or short segment occlusion in the proximal left SFA. Veins: No obvious venous pathology on this arterial phase study. Review of the MIP images confirms the above findings. NON-VASCULAR Lower chest: No acute abnormality. Hepatobiliary: No focal liver abnormality is seen. No gallstones, gallbladder wall thickening, or biliary dilatation. Pancreas: Unremarkable. No pancreatic ductal dilatation or surrounding inflammatory changes. Spleen: Normal in size without focal abnormality. Adrenals/Urinary Tract: Kidneys and adrenal glands unremarkable. Urinary bladder nondistended. Stomach/Bowel: Stomach and small bowel decompressed. Appendix surgically absent. Colon is nondilated, unremarkable. Lymphatic: No abdominal or pelvic adenopathy. Reproductive: Status post hysterectomy. No adnexal masses. Other: No ascites. No free air. Musculoskeletal: No acute or significant osseous findings. IMPRESSION: VASCULAR 1. 3.2 cm proximal abdominal aortic aneurysm. Recommend followup by [place] in 3 years. This recommendation follows ACR consensus guidelines: White Paper of the ACR Incidental Findings Committee II on Vascular Findings. J Am Coll Radiol 2013; 62:947-654 2. Bilateral iliac arterial occlusive disease, left worse than right. 3. Occlusive disease in bilateral proximal superficial femoral arteries. NON-VASCULAR 1. No acute findings. Electronically Signed   By: Lucrezia Europe M.D.   On: 03/24/2018 14:41   Disposition   Pt is being discharged home today in good condition.  Follow-up Plans & Appointments    Follow-up  Information    Lorretta Harp, MD Follow up on 05/05/2018.   Specialties:  Cardiology, Radiology Why:  at 10am for your follow up appt.  Contact information: [address] [place] [address] [phone]          Discharge Instructions    Diet - low sodium heart healthy   Complete by:  As directed    Discharge instructions   Complete by:  As directed    Groin Site Care Refer to this sheet in the next few weeks. These instructions provide you with information on caring for yourself after your procedure. Your caregiver may also give you more specific instructions. Your treatment has been planned according to current medical practices, but problems sometimes occur. Call your caregiver if you have any problems or questions after your procedure. HOME CARE INSTRUCTIONS You may shower 24 hours after the procedure. Remove the bandage (dressing) and gently wash the site with plain soap and water. Gently pat the site dry.  Do not apply powder or lotion to the site.  Do not sit in a bathtub, swimming pool, or whirlpool for 5 to 7 days.  No bending, squatting, or lifting anything over 10 pounds (4.5 kg) as directed by your caregiver.  Inspect the site at least twice daily.  Do not drive home if you are discharged the same day of the procedure. Have someone else drive you.  You may drive 24 hours after the procedure unless otherwise instructed by your caregiver.  What to expect: Any bruising will usually  fade within 1 to 2 weeks.  Blood that collects in the tissue (hematoma) may be painful to the touch. It should usually decrease in size and tenderness within 1 to 2 weeks.  SEEK IMMEDIATE MEDICAL CARE IF: You have unusual pain at the groin site or down the affected leg.  You have redness, warmth, swelling, or pain at the groin site.  You have drainage (other than a small amount of blood on the dressing).  You have chills.  You have a fever or persistent symptoms for more  than 72 hours.  You have a fever and your symptoms suddenly get worse.  Your leg becomes pale, cool, tingly, or numb.  You have heavy bleeding from the site. Hold pressure on the site. .   Increase activity slowly   Complete by:  As directed        Discharge Medications     Medication List    STOP taking these medications   diphenhydrAMINE 50 MG tablet Commonly known as:  BENADRYL   guaiFENesin 100 MG/5ML Soln Commonly known as:  ROBITUSSIN     TAKE these medications   acetaminophen 325 MG tablet Commonly known as:  TYLENOL Take 650 mg by mouth every 6 (six) hours as needed for mild pain or moderate pain. For pain   albuterol 108 (90 Base) MCG/ACT inhaler Commonly known as:  PROVENTIL HFA;VENTOLIN HFA Inhale 2 puffs into the lungs every 6 (six) hours as needed for wheezing or shortness of breath. What changed:  Another medication with the same name was changed. Make sure you understand how and when to take each.   albuterol (2.5 MG/3ML) 0.083% nebulizer solution Commonly known as:  PROVENTIL USE 1 AMPULE IN NEBULIZER EVERY 6 HOURS AS NEEDED FOR WHEEZING/SHORTNESS OF BREATH What changed:  See the new instructions.   aspirin EC 81 MG tablet Take 1 tablet (81 mg total) by mouth daily. What changed:  when to take this   calcium gluconate 500 MG tablet Take 500 mg by mouth 2 (two) times daily.   carvedilol 25 MG tablet Commonly known as:  COREG Take 1 tablet (25 mg total) by mouth 2 (two) times daily with a meal. What changed:    medication strength  how much to take   cholecalciferol 25 MCG (1000 UT) tablet Commonly known as:  VITAMIN D3 Take 2,000 Units by mouth every evening.   cilostazol 50 MG tablet Commonly known as:  PLETAL Take 1 tablet (50 mg total) by mouth 2 (two) times daily.   clotrimazole 1 % cream Commonly known as:  LOTRIMIN Apply 1 application topically 4 (four) times daily as needed. What changed:  reasons to take this     Fluticasone-Umeclidin-Vilant 100-62.5-25 MCG/INH Aepb Commonly known as:  Trelegy Ellipta Inhale 1 puff into the lungs daily.   lansoprazole 30 MG capsule Commonly known as:  PREVACID Take 30 mg by mouth 2 (two) times daily before a meal.   levothyroxine 25 MCG tablet Commonly known as:  SYNTHROID, LEVOTHROID Take 25 mcg by mouth daily.   nitroGLYCERIN 0.4 MG SL tablet Commonly known as:  NITROSTAT Place 1 tablet (0.4 mg total) under the tongue every 5 (five) minutes as needed for chest pain (X3 DOSES BEFORE CALLING 911).   pravastatin 20 MG tablet Commonly known as:  PRAVACHOL Take 1 tablet (20 mg total) by mouth daily. What changed:  when to take this   predniSONE 10 MG tablet Commonly known as:  DELTASONE Take 1 tablet (10 mg  total) by mouth as needed. What changed:    when to take this  reasons to take this  Another medication with the same name was removed. Continue taking this medication, and follow the directions you see here.   UNABLE TO FIND Inhale into the lungs daily as needed (shortness of breath). (Oxygen) 2-3 liters        Acute coronary syndrome (MI, NSTEMI, STEMI, etc) this admission?: No.     Outstanding Labs/Studies   N/a  Duration of Discharge Encounter   Greater than 30 minutes including physician time.  Signed, Reino Bellis NP-C 04/14/2018, 8:44 AM  Patient seen, examined. Available data reviewed. Agree with findings, assessment, and plan as outlined by Reino Bellis, NP-C.  On my exam today, the patient is an alert, oriented, elderly frail-appearing woman in no distress.  Lung fields are clear, heart is regular rate and rhythm no murmur or gallop, abdomen is soft, thin, nontender.  The right groin site is clear with no ecchymosis or hematoma.  Lower extremities are thin with no edema.  Telemetry is reviewed and shows an episode of nonsustained VT this morning.  The patient was asymptomatic and sleeping when this occurred.  She has had  normal LV systolic function by echo assessment.  We will increase her beta-blocker at discharge to a dose of carvedilol 25 mg twice daily.  We will arrange ambulatory telemetry monitoring for her.  She will follow-up in the office with Dr. Gwenlyn Found to determine options at revascularization of her lower extremities considering her complex disease.  Suspect her options will be limited to endovascular treatment with her frailty and home O2 dependence.    Sherren Mocha, M.D. 04/14/2018 10:38 AM

## 2018-04-16 ENCOUNTER — Other Ambulatory Visit: Payer: Self-pay

## 2018-04-16 ENCOUNTER — Ambulatory Visit (INDEPENDENT_AMBULATORY_CARE_PROVIDER_SITE_OTHER): Payer: Medicare Other | Admitting: Pulmonary Disease

## 2018-04-16 ENCOUNTER — Encounter: Payer: Self-pay | Admitting: Pulmonary Disease

## 2018-04-16 ENCOUNTER — Ambulatory Visit: Payer: Medicare Other | Admitting: Pulmonary Disease

## 2018-04-16 VITALS — BP 113/64 | HR 67 | Ht 64.0 in | Wt 82.0 lb

## 2018-04-16 DIAGNOSIS — E43 Unspecified severe protein-calorie malnutrition: Secondary | ICD-10-CM

## 2018-04-16 DIAGNOSIS — I2510 Atherosclerotic heart disease of native coronary artery without angina pectoris: Secondary | ICD-10-CM | POA: Diagnosis not present

## 2018-04-16 DIAGNOSIS — J9611 Chronic respiratory failure with hypoxia: Secondary | ICD-10-CM

## 2018-04-16 DIAGNOSIS — J449 Chronic obstructive pulmonary disease, unspecified: Secondary | ICD-10-CM | POA: Diagnosis not present

## 2018-04-16 MED ORDER — FUROSEMIDE 20 MG PO TABS: ORAL_TABLET | ORAL | 3 refills | Status: DC
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
## 2018-04-16 NOTE — Patient Instructions (Addendum)
Severe COPD with recurrent exacerbations: Continue Trelegy 1 puff daily Continue to use albuterol sparingly as needed for increasing shortness of breath or wheezing Practice good hand hygiene Stay physically active Use albuterol as needed for chest tightness wheezing or shortness of breath Stay away from sick people Do not touch your face  Ankle swelling Use lasix 20mg  daily as needed for ankle swelling Weigh daily  We will see you back in 3 to 4 months or sooner if needed

## 2018-04-24 ENCOUNTER — Telehealth: Payer: Self-pay | Admitting: Cardiovascular Disease

## 2018-04-24 NOTE — Telephone Encounter (Signed)
New Message   Jeri from [place] [facility] wants to make sure patient should keep appointment for Monday or be rescheduled.

## 2018-04-24 NOTE — Telephone Encounter (Signed)
Spoke with Dr Gwenlyn Found and he advised patient does not need any studies at this time. Clemencia Course and she will cancel and call patient.

## 2018-04-27 ENCOUNTER — Encounter (HOSPITAL_BASED_OUTPATIENT_CLINIC_OR_DEPARTMENT_OTHER): Payer: Medicare Other

## 2018-04-27 ENCOUNTER — Ambulatory Visit (HOSPITAL_BASED_OUTPATIENT_CLINIC_OR_DEPARTMENT_OTHER): Payer: Medicare Other

## 2018-04-30 ENCOUNTER — Telehealth: Payer: Self-pay | Admitting: Cardiovascular Disease

## 2018-04-30 NOTE — Telephone Encounter (Signed)
New Message:   Pt is scheduled for an appt on 05-05-18. She does not have My-Chart. She wants to know what does she need to do please.?

## 2018-04-30 NOTE — Telephone Encounter (Signed)
Virtual Visit Pre-Appointment Phone Call  Steps For Call:  1. Confirm consent - "In the setting of the current Covid19 crisis, you are scheduled for a (phone or video) visit with your provider on (date) at (time).  Just as we do with many in-office visits, in order for you to participate in this visit, we must obtain consent.  If you'd like, I can send this to your mychart (if signed up) or email for you to review.  Otherwise, I can obtain your verbal consent now.  All virtual visits are billed to your insurance company just like a normal visit would be.  By agreeing to a virtual visit, we'd like you to understand that the technology does not allow for your provider to perform an examination, and thus may limit your provider's ability to fully assess your condition.  Finally, though the technology is pretty good, we cannot assure that it will always work on either your or our end, and in the setting of a video visit, we may have to convert it to a phone-only visit.  In either situation, we cannot ensure that we have a secure connection.  Are you willing to proceed?"  2. Give patient instructions for WebEx download to smartphone as below if video visit  3. Advise patient to be prepared with any vital sign or heart rhythm information, their current medicines, and a piece of paper and pen handy for any instructions they may receive the day of their visit  4. Inform patient they will receive a phone call 15 minutes prior to their appointment time (may be from unknown caller ID) so they should be prepared to answer  5. Confirm that appointment type is correct in Epic appointment notes (video vs telephone)    TELEPHONE CALL NOTE  Tonya Harper has been deemed a candidate for a follow-up tele-health visit to limit community exposure during the Covid-19 pandemic. I spoke with the patient via phone to ensure availability of phone/video source, confirm preferred email & phone number, and discuss  instructions and expectations.  I reminded Tonya Harper to be prepared with any vital sign and/or heart rhythm information that could potentially be obtained via home monitoring, at the time of her visit. I reminded Tonya Harper to expect a phone call at the time of her visit if her visit.  Did the patient verbally acknowledge consent to treatment? YES  Sochima Dawna Part, RN 04/30/2018    DOWNLOADING THE [place], go to App Store and type in WebEx in the search bar. [place] [employer], the blue/green circle. The app is free but as with any other app downloads, their phone may require them to verify saved payment information or Apple password. The patient does NOT have to create an account.  - If Android, ask patient to go to [employer] and type in WebEx in the search bar. [place] [employer], the blue/green circle. The app is free but as with any other app downloads, their phone may require them to verify saved payment information or Android password. The patient does NOT have to create an account.   CONSENT FOR TELE-HEALTH VISIT - PLEASE REVIEW  I hereby voluntarily request, consent and authorize [facility] and its employed or contracted physicians, physician assistants, nurse practitioners or other licensed health care professionals (the Practitioner), to provide me with telemedicine health care services (the "Services") as deemed necessary by the treating Practitioner. I acknowledge and consent  to receive the Services by the Practitioner via telemedicine. I understand that the telemedicine visit will involve communicating with the Practitioner through live audiovisual communication technology and the disclosure of certain medical information by electronic transmission. I acknowledge that I have been given the opportunity to request an in-person assessment or other available alternative prior to the telemedicine visit and am  voluntarily participating in the telemedicine visit.  I understand that I have the right to withhold or withdraw my consent to the use of telemedicine in the course of my care at any time, without affecting my right to future care or treatment, and that the Practitioner or I may terminate the telemedicine visit at any time. I understand that I have the right to inspect all information obtained and/or recorded in the course of the telemedicine visit and may receive copies of available information for a reasonable fee.  I understand that some of the potential risks of receiving the Services via telemedicine include:  Marland Kitchen Delay or interruption in medical evaluation due to technological equipment failure or disruption; . Information transmitted may not be sufficient (e.g. poor resolution of images) to allow for appropriate medical decision making by the Practitioner; and/or  . In rare instances, security protocols could fail, causing a breach of personal health information.  Furthermore, I acknowledge that it is my responsibility to provide information about my medical history, conditions and care that is complete and accurate to the best of my ability. I acknowledge that Practitioner's advice, recommendations, and/or decision may be based on factors not within their control, such as incomplete or inaccurate data provided by me or distortions of diagnostic images or specimens that may result from electronic transmissions. I understand that the practice of medicine is not an exact science and that Practitioner makes no warranties or guarantees regarding treatment outcomes. I acknowledge that I will receive a copy of this consent concurrently upon execution via email to the email address I last provided but may also request a printed copy by calling the office of [facility].    I understand that my insurance will be billed for this visit.   I have read or had this consent read to me. . I understand the  contents of this consent, which adequately explains the benefits and risks of the Services being provided via telemedicine.  . I have been provided ample opportunity to ask questions regarding this consent and the Services and have had my questions answered to my satisfaction. . I give my informed consent for the services to be provided through the use of telemedicine in my medical care  By participating in this telemedicine visit I agree to the above.  Reviewed current medication list, allergies, pharmacy, MyChart status, and address with patient or DPR-approved person. Instructed on set up of virtual telephone or video visit. Advised to collect blood pressure, heart rate, and weight 1 hour prior to appointment if possible.

## 2018-05-04 ENCOUNTER — Telehealth: Payer: Self-pay

## 2018-05-04 NOTE — Telephone Encounter (Signed)
Left voicemail with new appt time of 9:15am on 4/10

## 2018-05-04 NOTE — Telephone Encounter (Signed)
   Cardiac Questionnaire:    Since your last visit or hospitalization:    1. Have you been having new or worsening chest pain? NO   2. Have you been having new or worsening shortness of breath? NO 3. Have you been having new or worsening leg swelling, wt gain, or increase in abdominal girth (pants fitting more tightly)? NO   4. Have you had any passing out spells? NO    *A YES to any of these questions would result in the appointment being kept. *If all the answers to these questions are NO, we should indicate that given the current situation regarding the worldwide coronarvirus pandemic, at the recommendation of the CDC, we are looking to limit gatherings in our waiting area, and thus will reschedule their appointment beyond four weeks from today.   _____________   COVID-19 Pre-Screening Questions:  . Do you currently have a fever? NO . Have you recently travelled on a cruise, internationally, or to [place], [place], [place], [place], [place], or [place], [place] ([employer])? NO . Have you been in contact with someone that is currently pending confirmation of Covid19 testing or has been confirmed to have the Covid19 virus?  NO Are you currently experiencing fatigue or cough?NO          

## 2018-05-05 ENCOUNTER — Telehealth: Payer: Medicare Other | Admitting: Cardiovascular Disease

## 2018-05-07 ENCOUNTER — Telehealth: Payer: Self-pay | Admitting: Cardiovascular Disease

## 2018-05-07 NOTE — Telephone Encounter (Signed)
Smartphone/ my chart at a later date/ pre reg completed

## 2018-05-08 ENCOUNTER — Telehealth (INDEPENDENT_AMBULATORY_CARE_PROVIDER_SITE_OTHER): Payer: Medicare Other | Admitting: Cardiovascular Disease

## 2018-05-08 ENCOUNTER — Telehealth: Payer: Self-pay

## 2018-05-08 ENCOUNTER — Other Ambulatory Visit: Payer: Self-pay

## 2018-05-08 DIAGNOSIS — I714 Abdominal aortic aneurysm, without rupture, unspecified: Secondary | ICD-10-CM

## 2018-05-08 DIAGNOSIS — I739 Peripheral vascular disease, unspecified: Secondary | ICD-10-CM | POA: Diagnosis not present
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
## 2018-05-08 NOTE — Patient Instructions (Signed)

## 2018-05-08 NOTE — Telephone Encounter (Signed)
Virtual Visit Pre-Appointment Phone Call  Steps For Call:  1. Confirm consent - "In the setting of the current Covid19 crisis, you are scheduled for a (phone or video) visit with your provider on (date) at (time).  Just as we do with many in-office visits, in order for you to participate in this visit, we must obtain consent.  If you'd like, I can send this to your mychart (if signed up) or email for you to review.  Otherwise, I can obtain your verbal consent now.  All virtual visits are billed to your insurance company just like a normal visit would be.  By agreeing to a virtual visit, we'd like you to understand that the technology does not allow for your provider to perform an examination, and thus may limit your provider's ability to fully assess your condition.  Finally, though the technology is pretty good, we cannot assure that it will always work on either your or our end, and in the setting of a video visit, we may have to convert it to a phone-only visit.  In either situation, we cannot ensure that we have a secure connection.  Are you willing to proceed?"  2. Give patient instructions for WebEx download to smartphone as below if video visit  3. Advise patient to be prepared with any vital sign or heart rhythm information, their current medicines, and a piece of paper and pen handy for any instructions they may receive the day of their visit  4. Inform patient they will receive a phone call 15 minutes prior to their appointment time (may be from unknown caller ID) so they should be prepared to answer  5. Confirm that appointment type is correct in Epic appointment notes (video vs telephone)    TELEPHONE CALL NOTE  Tonya Harper has been deemed a candidate for a follow-up tele-health visit to limit community exposure during the Covid-19 pandemic. I spoke with the patient via phone to ensure availability of phone/video source, confirm preferred email & phone number, and discuss  instructions and expectations.  I reminded Tonya Harper to be prepared with any vital sign and/or heart rhythm information that could potentially be obtained via home monitoring, at the time of her visit. I reminded Tonya Harper to expect a phone call at the time of her visit if her visit.  Did the patient verbally acknowledge consent to treatment? yes  Tonya Brod, RN 05/08/2018    DOWNLOADING THE [place], go to App Store and type in WebEx in the search bar. [place] [employer], the blue/green circle. The app is free but as with any other app downloads, their phone may require them to verify saved payment information or Apple password. The patient does NOT have to create an account.  - If Android, ask patient to go to [employer] and type in WebEx in the search bar. [place] [employer], the blue/green circle. The app is free but as with any other app downloads, their phone may require them to verify saved payment information or Android password. The patient does NOT have to create an account.   CONSENT FOR TELE-HEALTH VISIT - PLEASE REVIEW  I hereby voluntarily request, consent and authorize [facility] and its employed or contracted physicians, physician assistants, nurse practitioners or other licensed health care professionals (the Practitioner), to provide me with telemedicine health care services (the "Services") as deemed necessary by the treating Practitioner. I acknowledge and consent  to receive the Services by the Practitioner via telemedicine. I understand that the telemedicine visit will involve communicating with the Practitioner through live audiovisual communication technology and the disclosure of certain medical information by electronic transmission. I acknowledge that I have been given the opportunity to request an in-person assessment or other available alternative prior to the telemedicine visit and am  voluntarily participating in the telemedicine visit.  I understand that I have the right to withhold or withdraw my consent to the use of telemedicine in the course of my care at any time, without affecting my right to future care or treatment, and that the Practitioner or I may terminate the telemedicine visit at any time. I understand that I have the right to inspect all information obtained and/or recorded in the course of the telemedicine visit and may receive copies of available information for a reasonable fee.  I understand that some of the potential risks of receiving the Services via telemedicine include:  [place] Delay or interruption in medical evaluation due to technological equipment failure or disruption; . Information transmitted may not be sufficient (e.g. poor resolution of images) to allow for appropriate medical decision making by the Practitioner; and/or  . In rare instances, security protocols could fail, causing a breach of personal health information.  Furthermore, I acknowledge that it is my responsibility to provide information about my medical history, conditions and care that is complete and accurate to the best of my ability. I acknowledge that Practitioner's advice, recommendations, and/or decision may be based on factors not within their control, such as incomplete or inaccurate data provided by me or distortions of diagnostic images or specimens that may result from electronic transmissions. I understand that the practice of medicine is not an exact science and that Practitioner makes no warranties or guarantees regarding treatment outcomes. I acknowledge that I will receive a copy of this consent concurrently upon execution via email to the email address I last provided but may also request a printed copy by calling the office of [place].    I understand that my insurance will be billed for this visit.   I have read or had this consent read to me. . I understand the  contents of this consent, which adequately explains the benefits and risks of the Services being provided via telemedicine.  . I have been provided ample opportunity to ask questions regarding this consent and the Services and have had my questions answered to my satisfaction. . I give my informed consent for the services to be provided through the use of telemedicine in my medical care  By participating in this telemedicine visit I agree to the above.

## 2018-05-08 NOTE — Telephone Encounter (Signed)
Left message to f/u in 6 mos per AVS; will message med records to mail AVS

## 2018-05-11 ENCOUNTER — Telehealth: Payer: Self-pay | Admitting: *Deleted

## 2018-05-11 NOTE — Telephone Encounter (Signed)
Contacted patient to arrange to have 30 day cardiac event monitor shipped to her home.  Obtained land address:  [address] [place],  [address].  Patient will be enrolled with [employer] to ship monitor.  [employer] will contact patient today to confirm shipping address.  Once patient receives.  She needs to contact [employer] to assist in application and send her baseline recording. Patient also mentioned she was having swelling in her ankles.  Patient was instructed to contact Dr. Stanford Breed regarding symptoms.

## 2018-05-14 ENCOUNTER — Ambulatory Visit (INDEPENDENT_AMBULATORY_CARE_PROVIDER_SITE_OTHER): Payer: Medicare Other

## 2018-05-14 DIAGNOSIS — I472 Ventricular tachycardia, unspecified: Secondary | ICD-10-CM

## 2018-05-14 DIAGNOSIS — R002 Palpitations: Secondary | ICD-10-CM | POA: Diagnosis not present

## 2018-05-14 DIAGNOSIS — I493 Ventricular premature depolarization: Secondary | ICD-10-CM

## 2018-05-30 ENCOUNTER — Other Ambulatory Visit: Payer: Self-pay | Admitting: Pulmonary Disease

## 2018-06-02 DIAGNOSIS — E559 Vitamin D deficiency, unspecified: Secondary | ICD-10-CM | POA: Diagnosis not present

## 2018-06-02 DIAGNOSIS — I10 Essential (primary) hypertension: Secondary | ICD-10-CM | POA: Diagnosis not present

## 2018-06-02 DIAGNOSIS — Z87891 Personal history of nicotine dependence: Secondary | ICD-10-CM | POA: Diagnosis not present

## 2018-06-02 DIAGNOSIS — E7800 Pure hypercholesterolemia, unspecified: Secondary | ICD-10-CM | POA: Diagnosis not present

## 2018-06-02 DIAGNOSIS — M25572 Pain in left ankle and joints of left foot: Secondary | ICD-10-CM | POA: Diagnosis not present

## 2018-06-02 DIAGNOSIS — J449 Chronic obstructive pulmonary disease, unspecified: Secondary | ICD-10-CM | POA: Diagnosis not present

## 2018-06-02 DIAGNOSIS — I739 Peripheral vascular disease, unspecified: Secondary | ICD-10-CM | POA: Diagnosis not present

## 2018-06-02 DIAGNOSIS — E890 Postprocedural hypothyroidism: Secondary | ICD-10-CM | POA: Diagnosis not present

## 2018-06-02 DIAGNOSIS — M810 Age-related osteoporosis without current pathological fracture: Secondary | ICD-10-CM | POA: Diagnosis not present

## 2018-06-02 DIAGNOSIS — E43 Unspecified severe protein-calorie malnutrition: Secondary | ICD-10-CM | POA: Diagnosis not present

## 2018-06-02 DIAGNOSIS — J9611 Chronic respiratory failure with hypoxia: Secondary | ICD-10-CM | POA: Diagnosis not present

## 2018-06-09 ENCOUNTER — Other Ambulatory Visit: Payer: Self-pay

## 2018-06-09 ENCOUNTER — Other Ambulatory Visit: Payer: Self-pay | Admitting: Cardiology

## 2018-06-09 DIAGNOSIS — I472 Ventricular tachycardia, unspecified: Secondary | ICD-10-CM

## 2018-06-09 DIAGNOSIS — I493 Ventricular premature depolarization: Secondary | ICD-10-CM

## 2018-06-09 DIAGNOSIS — R002 Palpitations: Secondary | ICD-10-CM

## 2018-06-17 ENCOUNTER — Other Ambulatory Visit: Payer: Self-pay

## 2018-06-17 MED ORDER — CARVEDILOL 25 MG PO TABS: 25.0000 mg | ORAL_TABLET | Freq: Two times a day (BID) | ORAL | 2 refills | Status: DC

## 2018-06-19 ENCOUNTER — Other Ambulatory Visit: Payer: Self-pay

## 2018-06-19 MED ORDER — CARVEDILOL 25 MG PO TABS: 25.0000 mg | ORAL_TABLET | Freq: Two times a day (BID) | ORAL | 3 refills | Status: DC

## 2018-06-26 ENCOUNTER — Telehealth: Payer: Self-pay | Admitting: *Deleted

## 2018-06-26 DIAGNOSIS — Z79899 Other long term (current) drug therapy: Secondary | ICD-10-CM

## 2018-06-26 MED ORDER — APIXABAN 5 MG PO TABS: 5.0000 mg | ORAL_TABLET | Freq: Two times a day (BID) | ORAL | 6 refills | Status: DC

## 2018-06-26 NOTE — Telephone Encounter (Signed)
Follow up  ° ° °Patient is returning call.  °

## 2018-06-26 NOTE — Telephone Encounter (Signed)
Spoke with pt, Aware of dr crenshaw's recommendations.  °

## 2018-06-26 NOTE — Telephone Encounter (Addendum)
Left message for pt to call    ----- Message from [name], MD sent at 06/26/2018  7:33 AM EDT ----- DC ASA and pletal; apixaban 5 mg BID; bmet and CBC 4 weeks; fu virtual ov  Kirk Ruths

## 2018-06-26 NOTE — Telephone Encounter (Signed)
Pt informed of providers result & recommendations. Pt verbalized understanding. No further questions . Video appt scheduled for the 1st available(there is nothing available in 1 month)for 7-28 for vir OV she will come in for lab 6-29

## 2018-06-29 ENCOUNTER — Telehealth: Payer: Self-pay | Admitting: Cardiology

## 2018-06-29 NOTE — Telephone Encounter (Signed)
Spoke with pt, savings card and patient assistance paperwork mailed to patients home address.

## 2018-06-29 NOTE — Telephone Encounter (Signed)
Pt had medication changed after wearing a heart monitor. The new copay for her medication (eliquis) is $95/mo. She wants to know if there is an alternate medication that she can be prescribed

## 2018-07-29 DIAGNOSIS — Z87891 Personal history of nicotine dependence: Secondary | ICD-10-CM | POA: Diagnosis not present

## 2018-07-29 DIAGNOSIS — I739 Peripheral vascular disease, unspecified: Secondary | ICD-10-CM | POA: Diagnosis not present

## 2018-07-29 DIAGNOSIS — E43 Unspecified severe protein-calorie malnutrition: Secondary | ICD-10-CM | POA: Diagnosis not present

## 2018-07-29 DIAGNOSIS — E890 Postprocedural hypothyroidism: Secondary | ICD-10-CM | POA: Diagnosis not present

## 2018-07-29 DIAGNOSIS — I10 Essential (primary) hypertension: Secondary | ICD-10-CM | POA: Diagnosis not present

## 2018-07-29 DIAGNOSIS — I2510 Atherosclerotic heart disease of native coronary artery without angina pectoris: Secondary | ICD-10-CM | POA: Diagnosis not present

## 2018-07-29 DIAGNOSIS — I5022 Chronic systolic (congestive) heart failure: Secondary | ICD-10-CM | POA: Diagnosis not present

## 2018-07-29 DIAGNOSIS — J449 Chronic obstructive pulmonary disease, unspecified: Secondary | ICD-10-CM | POA: Diagnosis not present

## 2018-07-29 DIAGNOSIS — J9611 Chronic respiratory failure with hypoxia: Secondary | ICD-10-CM | POA: Diagnosis not present
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
## 2018-08-21 DIAGNOSIS — Z79899 Other long term (current) drug therapy: Secondary | ICD-10-CM | POA: Diagnosis not present

## 2018-08-22 LAB — BASIC METABOLIC PANEL
BUN/Creatinine Ratio: 10 — ABNORMAL LOW (ref 12–28)
BUN: 9 mg/dL (ref 8–27)
CO2: 25 mmol/L (ref 20–29)
Calcium: 9 mg/dL (ref 8.7–10.3)
Chloride: 104 mmol/L (ref 96–106)
Creatinine, Ser: 0.94 mg/dL (ref 0.57–1.00)
GFR calc Af Amer: 71 mL/min/{1.73_m2} (ref >59)
GFR calc non Af Amer: 61 mL/min/{1.73_m2} (ref >59)
Glucose: 96 mg/dL (ref 65–99)
Potassium: 4.5 mmol/L (ref 3.5–5.2)
Sodium: 143 mmol/L (ref 134–144)

## 2018-08-22 LAB — CBC
Hematocrit: 35.1 % (ref 34.0–46.6)
Hemoglobin: 11.2 g/dL (ref 11.1–15.9)
MCH: 28 pg (ref 26.6–33.0)
MCHC: 31.9 g/dL (ref 31.5–35.7)
MCV: 88 fL (ref 79–97)
Platelets: 226 10*3/uL (ref 150–450)
RBC: 4 x10E6/uL (ref 3.77–5.28)
RDW: 12.3 % (ref 11.7–15.4)
WBC: 11 10*3/uL — ABNORMAL HIGH (ref 3.4–10.8)

## 2018-08-24 ENCOUNTER — Telehealth: Payer: Self-pay | Admitting: Cardiology

## 2018-08-24 NOTE — Telephone Encounter (Signed)
LVM, asking pt to call back to give consent for his Virtual Visit on 08-25-18. apptc

## 2018-08-25 ENCOUNTER — Telehealth (INDEPENDENT_AMBULATORY_CARE_PROVIDER_SITE_OTHER): Payer: Medicare Other | Admitting: Cardiology

## 2018-08-25 VITALS — Ht 64.0 in | Wt 85.0 lb

## 2018-08-25 DIAGNOSIS — I10 Essential (primary) hypertension: Secondary | ICD-10-CM | POA: Diagnosis not present

## 2018-08-25 DIAGNOSIS — E7800 Pure hypercholesterolemia, unspecified: Secondary | ICD-10-CM

## 2018-08-25 DIAGNOSIS — I739 Peripheral vascular disease, unspecified: Secondary | ICD-10-CM | POA: Diagnosis not present

## 2018-08-25 DIAGNOSIS — I2510 Atherosclerotic heart disease of native coronary artery without angina pectoris: Secondary | ICD-10-CM | POA: Diagnosis not present

## 2018-08-25 DIAGNOSIS — I714 Abdominal aortic aneurysm, without rupture, unspecified: Secondary | ICD-10-CM

## 2018-08-25 NOTE — Patient Instructions (Signed)
Medication Instructions:  NO CHANGE If you need a refill on your cardiac medications before your next appointment, please call your pharmacy.   Lab work: Your physician recommends that you return for lab work PRIOR TO EATING If you have labs (blood work) drawn today and your tests are completely normal, you will receive your results only by: . MyChart Message (if you have MyChart) OR . A paper copy in the mail If you have any lab test that is abnormal or we need to change your treatment, we will call you to review the results.  Follow-Up: At [facility], you and your health needs are our priority.  As part of our continuing mission to provide you with exceptional heart care, we have created designated Provider Care Teams.  These Care Teams include your primary Cardiologist (physician) and Advanced Practice Providers (APPs -  Physician Assistants and Nurse Practitioners) who all work together to provide you with the care you need, when you need it. You will need a follow up appointment in 6 months.  Please call our office 2 months in advance to schedule this appointment.  You may see BRIAN CRENSHAW MD or one of the following Advanced Practice Providers on your designated Care Team:   Luke Kilroy, PA-C Krista Kroeger, PA-C . Callie Goodrich, PA-C     

## 2018-08-29 ENCOUNTER — Other Ambulatory Visit: Payer: Self-pay | Admitting: Pulmonary Disease

## 2018-09-18 DIAGNOSIS — E7800 Pure hypercholesterolemia, unspecified: Secondary | ICD-10-CM | POA: Diagnosis not present

## 2018-09-19 LAB — LIPID PANEL
Chol/HDL Ratio: 2.6 ratio (ref 0.0–4.4)
Cholesterol, Total: 182 mg/dL (ref 100–199)
HDL: 69 mg/dL (ref >39)
LDL Calculated: 88 mg/dL (ref 0–99)
Triglycerides: 127 mg/dL (ref 0–149)
VLDL Cholesterol Cal: 25 mg/dL (ref 5–40)

## 2018-09-19 LAB — HEPATIC FUNCTION PANEL
ALT: 5 IU/L (ref 0–32)
AST: 12 IU/L (ref 0–40)
Albumin: 4.1 g/dL (ref 3.7–4.7)
Alkaline Phosphatase: 27 IU/L — ABNORMAL LOW (ref 39–117)
Bilirubin Total: 0.7 mg/dL (ref 0.0–1.2)
Bilirubin, Direct: 0.16 mg/dL (ref 0.00–0.40)
Total Protein: 7 g/dL (ref 6.0–8.5)

## 2018-09-21 ENCOUNTER — Encounter: Payer: Self-pay | Admitting: *Deleted

## 2018-10-16 ENCOUNTER — Other Ambulatory Visit: Payer: Self-pay | Admitting: Cardiovascular Disease

## 2018-10-16 DIAGNOSIS — E785 Hyperlipidemia, unspecified: Secondary | ICD-10-CM

## 2018-10-16 NOTE — Telephone Encounter (Signed)
New message    *STAT* If patient is at the pharmacy, call can be transferred to refill team.   1. Which medications need to be refilled? (please list name of each medication and dose if known)pravastatin (PRAVACHOL) 20 MG tablet  2. Which pharmacy/location (including street and city if local pharmacy) is medication to be sent to?[id number] [place] [place] [address] AT [address]  3. Do they need a 30 day or 90 day supply? [place]

## 2018-10-19 MED ORDER — PRAVASTATIN SODIUM 20 MG PO TABS: 20.0000 mg | ORAL_TABLET | Freq: Every evening | ORAL | 3 refills | Status: DC

## 2018-10-19 NOTE — Telephone Encounter (Signed)
LVM letting patient know that I refilled her Pravastatin.

## 2018-10-30 ENCOUNTER — Telehealth: Payer: Self-pay | Admitting: Pulmonary Disease

## 2018-10-30 MED ORDER — TRELEGY ELLIPTA 100-62.5-25 MCG/INH IN AEPB: 1.0000 | INHALATION_SPRAY | Freq: Every day | RESPIRATORY_TRACT | 0 refills | Status: DC

## 2018-10-30 NOTE — Telephone Encounter (Signed)
Spoke with the pt and notified will refill her trelegy, but she needs to est care with new doc  She verbalized understanding  Appt was scheduled with Dr Carlis Abbott on 11/16/18  Nothing further needed

## 2018-11-03 DIAGNOSIS — R5383 Other fatigue: Secondary | ICD-10-CM | POA: Diagnosis not present

## 2018-11-03 DIAGNOSIS — E559 Vitamin D deficiency, unspecified: Secondary | ICD-10-CM | POA: Diagnosis not present

## 2018-11-03 DIAGNOSIS — M810 Age-related osteoporosis without current pathological fracture: Secondary | ICD-10-CM | POA: Diagnosis not present

## 2018-11-03 DIAGNOSIS — E0590 Thyrotoxicosis, unspecified without thyrotoxic crisis or storm: Secondary | ICD-10-CM | POA: Diagnosis not present

## 2018-11-05 DIAGNOSIS — Z23 Encounter for immunization: Secondary | ICD-10-CM | POA: Diagnosis not present

## 2018-11-05 DIAGNOSIS — J449 Chronic obstructive pulmonary disease, unspecified: Secondary | ICD-10-CM | POA: Diagnosis not present

## 2018-11-05 DIAGNOSIS — E890 Postprocedural hypothyroidism: Secondary | ICD-10-CM | POA: Diagnosis not present

## 2018-11-05 DIAGNOSIS — I10 Essential (primary) hypertension: Secondary | ICD-10-CM | POA: Diagnosis not present

## 2018-11-05 DIAGNOSIS — E43 Unspecified severe protein-calorie malnutrition: Secondary | ICD-10-CM | POA: Diagnosis not present

## 2018-11-05 DIAGNOSIS — I2510 Atherosclerotic heart disease of native coronary artery without angina pectoris: Secondary | ICD-10-CM | POA: Diagnosis not present

## 2018-11-06 ENCOUNTER — Ambulatory Visit (INDEPENDENT_AMBULATORY_CARE_PROVIDER_SITE_OTHER): Payer: Medicare Other | Admitting: Cardiovascular Disease

## 2018-11-06 ENCOUNTER — Other Ambulatory Visit: Payer: Self-pay

## 2018-11-06 ENCOUNTER — Encounter: Payer: Self-pay | Admitting: Cardiovascular Disease

## 2018-11-06 VITALS — BP 144/60 | HR 62 | Temp 97.7°F | Ht 64.0 in | Wt 86.0 lb

## 2018-11-06 DIAGNOSIS — I739 Peripheral vascular disease, unspecified: Secondary | ICD-10-CM | POA: Diagnosis not present

## 2018-11-06 DIAGNOSIS — I2510 Atherosclerotic heart disease of native coronary artery without angina pectoris: Secondary | ICD-10-CM | POA: Diagnosis not present

## 2018-11-06 DIAGNOSIS — Z008 Encounter for other general examination: Secondary | ICD-10-CM

## 2018-11-06 NOTE — Progress Notes (Signed)
11/06/2018 Tonya Harper   March 28, 1945  XN:[id number]  Primary Physician Tonya Harper, Tonya Cabal, MD Primary Cardiologist: Tonya Harp MD Tonya Harper, [place]  HPI:  Tonya Harper is a 73 y.o.  thin-appearing divorced African-American female mother of 2, her mother for grandchildren referred by Dr. Stanford Breed for evaluation of symptomatic PAD. I last had a virtual telemedicine phone visit with her on 05/08/2018.Her primary care provider is Dr. Coralyn Harper. She has a history of hypertension and hyperlipidemia. She smokes requires a pack a day up until 2013 (30-40 pack years). She does have a history of CAD and mild to moderate LV dysfunction. She has a small to moderate size infrarenal abdominal aortic aneurysm demonstrated by Dopplers October 2017 measuring 2.4 x 3.9 cm. She can walk only 20-30 feet and had recent Dopplers performed 12/11/15 revealing ABIs in the 0.6 range bilaterally with iliac, common femoral and superficial femoral artery disease. She wishedto initially pursue a pharmacologic approach and if unsuccessful a more aggressive approach. I began her on Pletal which did afford her some benefit.Since I saw her 3 months ago she continues to enjoy the benefits of pharmacologic therapy and wishes to put off an invasive approach at this time.  Since I saw her a year and a half ago her claudication has gotten progressively worse. Her distal aorta is subocclusive as are both iliac arteries. She has SFA disease as well. She wishes not to pursue an invasive approach to define her anatomy and potential provide endovascular therapy for lifestyle limiting claudication.I had a CT angiogram performed on her 03/24/2018 revealing small abdominal aortic aneurysm and severe bilateral iliac occlusive disease left greater than right with right SFA occlusive disease as well. She is now ready to proceed with angiography and potential intervention.  I performed peripheral angiography on her 04/15/2018  in the right femoral approach revealing diffuse iliac disease bilaterally, total SFAs bilaterally.  I did not think she was either a surgical candidate for revascularization or an endovascular candidate.  She is on Pletal which offers her some mild benefit with regards to her claudication.  Since I spoke to her 6 months ago she continues to do well.  Her Pletal was discontinued and she was begun on apixaban because of PAF.  She really denies claudication chest pain or shortness of breath.   Current Meds  Medication Sig  . acetaminophen (TYLENOL) 325 MG tablet Take 650 mg by mouth every 6 (six) hours as needed for mild pain or moderate pain. For pain  . albuterol (PROVENTIL HFA;VENTOLIN HFA) 108 (90 Base) MCG/ACT inhaler Inhale 2 puffs into the lungs every 6 (six) hours as needed for wheezing or shortness of breath.  Marland Kitchen albuterol (PROVENTIL) (2.5 MG/3ML) 0.083% nebulizer solution USE 1 AMPULE IN NEBULIZER EVERY 6 HOURS AS NEEDED FOR WHEEZING/SHORTNESS OF BREATH (Patient taking differently: Take 2.5 mg by nebulization every 6 (six) hours as needed for wheezing or shortness of breath. )  . apixaban (ELIQUIS) 5 MG TABS tablet Take 1 tablet (5 mg total) by mouth 2 (two) times daily.  . calcium gluconate 500 MG tablet Take 500 mg by mouth 2 (two) times daily.   . carvedilol (COREG) 25 MG tablet Take 1 tablet (25 mg total) by mouth 2 (two) times daily with a meal.  . cholecalciferol (VITAMIN D3) 25 MCG (1000 UT) tablet Take 2,000 Units by mouth every evening.  . clotrimazole (LOTRIMIN) 1 % cream Apply 1 application topically 4 (four) times daily as needed. (Patient taking  differently: Apply 1 application topically 4 (four) times daily as needed (for rash). )  . Fluticasone-Umeclidin-Vilant (TRELEGY ELLIPTA) 100-62.5-25 MCG/INH AEPB Inhale 1 puff into the lungs daily.  . furosemide (LASIX) 20 MG tablet Take 1 tablet daily as needed for increased ankle swelling  . lansoprazole (PREVACID) 30 MG capsule Take 30  mg by mouth 2 (two) times daily before a meal.   . levothyroxine (SYNTHROID, LEVOTHROID) 25 MCG tablet Take 25 mcg by mouth daily.   . nitroGLYCERIN (NITROSTAT) 0.4 MG SL tablet Place 1 tablet (0.4 mg total) under the tongue every 5 (five) minutes as needed for chest pain (X3 DOSES BEFORE CALLING 911).  . pravastatin (PRAVACHOL) 20 MG tablet Take 1 tablet (20 mg total) by mouth every evening.  . predniSONE (DELTASONE) 10 MG tablet TAKE 1 TABLET (10 MG TOTAL) BY MOUTH AS NEEDED.  Marland Kitchen UNABLE TO FIND Inhale into the lungs daily as needed (shortness of breath). (Oxygen) 2-3 liters     Allergies  Allergen Reactions  . Aspirin Swelling    nausea and dizziness after taking for one month at a time.. Tolerates the 81 mg   . Pneumovax [Pneumococcal Polysaccharide Vaccine] Hives and Swelling  . Shellfish Allergy Anaphylaxis and Swelling    Medication withdrawal symptoms    . Ibuprofen Nausea And Vomiting    dizziness  . Tdap [Tetanus-Diphth-Acell Pertussis] Swelling and Rash    Social History   Socioeconomic History  . Marital status: Married    Spouse name: Not on file  . Number of children: 2  . Years of education: Not on file  . Highest education level: Not on file  Occupational History  . Occupation: unemployed    Comment: Retired  Scientific laboratory technician  . Financial resource strain: Not on file  . Food insecurity    Worry: Not on file    Inability: Not on file  . Transportation needs    Medical: Not on file    Non-medical: Not on file  Tobacco Use  . Smoking status: Former Smoker    Packs/day: 0.50    Years: 40.00    Pack years: 20.00    Types: Cigarettes    Quit date: 02/14/2011    Years since quitting: 7.7  . Smokeless tobacco: Never Used  Substance and Sexual Activity  . Alcohol use: Yes    Alcohol/week: 0.0 standard drinks    Comment: occ  . Drug use: No    Types: Methylphenidate    Comment: denies uses 10/10/14  . Sexual activity: Yes    Birth control/protection: Surgical   Lifestyle  . Physical activity    Days per week: Not on file    Minutes per session: Not on file  . Stress: Not on file  Relationships  . Social Herbalist on phone: Not on file    Gets together: Not on file    Attends religious service: Not on file    Active member of club or organization: Not on file    Attends meetings of clubs or organizations: Not on file    Relationship status: Not on file  . Intimate partner violence    Fear of current or ex partner: Not on file    Emotionally abused: Not on file    Physically abused: Not on file    Forced sexual activity: Not on file  Other Topics Concern  . Not on file  Social History Narrative  . Not on file     Review  of Systems: General: negative for chills, fever, night sweats or weight changes.  Cardiovascular: negative for chest pain, dyspnea on exertion, edema, orthopnea, palpitations, paroxysmal nocturnal dyspnea or shortness of breath Dermatological: negative for rash Respiratory: negative for cough or wheezing Urologic: negative for hematuria Abdominal: negative for nausea, vomiting, diarrhea, bright red blood per rectum, melena, or hematemesis Neurologic: negative for visual changes, syncope, or dizziness All other systems reviewed and are otherwise negative except as noted above.    Blood pressure (!) 144/60, pulse 62, temperature 97.7 F (36.5 C), height 5\' 4"  (1.626 m), weight 86 lb (39 kg).  General appearance: alert and no distress Neck: no adenopathy, no carotid bruit, no JVD, supple, symmetrical, trachea midline and thyroid not enlarged, symmetric, no tenderness/mass/nodules Lungs: clear to auscultation bilaterally Heart: regular rate and rhythm, S1, S2 normal, no murmur, click, rub or gallop Extremities: extremities normal, atraumatic, no cyanosis or edema Pulses: 2+ and symmetric Absent pedal pulses Skin: Skin color, texture, turgor normal. No rashes or lesions Neurologic: Alert and oriented X 3,  normal strength and tone. Normal symmetric reflexes. Normal coordination and gait  EKG sinus rhythm at 62 without ST or T wave changes.  Personally reviewed this EKG.  ASSESSMENT AND PLAN:   Claudication in peripheral vascular disease ([place]) History of PAD status post lower extremity angiography by myself 04/13/2018 revealing diffuse iliac disease, high-grade bilateral ostial profunda femoris disease and occluded SFAs bilaterally with two-vessel runoff on the right and 1 on the left.  She denies claudication.  There is no evidence of critical limb ischemia.  She was on Pletal which was discontinued when she started apixaban because of PAF.  I will see her back as needed.      Tonya Harp MD FACP,FACC,FAHA, [facility] 11/06/2018 3:56 PM

## 2018-11-06 NOTE — Assessment & Plan Note (Signed)
History of PAD status post lower extremity angiography by myself 04/13/2018 revealing diffuse iliac disease, high-grade bilateral ostial profunda femoris disease and occluded SFAs bilaterally with two-vessel runoff on the right and 1 on the left.  She denies claudication.  There is no evidence of critical limb ischemia.  She was on Pletal which was discontinued when she started apixaban because of PAF.  I will see her back as needed.

## 2018-11-06 NOTE — Patient Instructions (Signed)
Medication Instructions:  Your physician recommends that you continue on your current medications as directed. Please refer to the Current Medication list given to you today.  If you need a refill on your cardiac medications before your next appointment, please call your pharmacy.   Lab work: none If you have labs (blood work) drawn today and your tests are completely normal, you will receive your results only by: Marland Kitchen MyChart Message (if you have MyChart) OR . A paper copy in the mail If you have any lab test that is abnormal or we need to change your treatment, we will call you to review the results.  Testing/Procedures: none  Follow-Up: At [facility], you and your health needs are our priority.  As part of our continuing mission to provide you with exceptional heart care, we have created designated Provider Care Teams.  These Care Teams include your primary Cardiologist (physician) and Advanced Practice Providers (APPs -  Physician Assistants and Nurse Practitioners) who all work together to provide you with the care you need, when you need it. . You may schedule a follow up appointment as needed.  You may see Dr. Gwenlyn Found or one of the following Advanced Practice Providers on your designated Care Team:   . Kerin Ransom, PA-C . Daleen Snook Kroeger, PA-C . Sande Rives, PA-C .

## 2018-11-16 ENCOUNTER — Ambulatory Visit (INDEPENDENT_AMBULATORY_CARE_PROVIDER_SITE_OTHER): Payer: Medicare Other | Admitting: Critical Care Medicine

## 2018-11-16 ENCOUNTER — Other Ambulatory Visit: Payer: Self-pay

## 2018-11-16 ENCOUNTER — Encounter: Payer: Self-pay | Admitting: Critical Care Medicine

## 2018-11-16 VITALS — BP 136/64 | HR 75 | Temp 98.7°F | Ht 64.0 in | Wt 86.0 lb

## 2018-11-16 DIAGNOSIS — R1310 Dysphagia, unspecified: Secondary | ICD-10-CM

## 2018-11-16 DIAGNOSIS — E43 Unspecified severe protein-calorie malnutrition: Secondary | ICD-10-CM

## 2018-11-16 DIAGNOSIS — R1319 Other dysphagia: Secondary | ICD-10-CM

## 2018-11-16 DIAGNOSIS — Z8719 Personal history of other diseases of the digestive system: Secondary | ICD-10-CM

## 2018-11-16 DIAGNOSIS — R5381 Other malaise: Secondary | ICD-10-CM | POA: Diagnosis not present

## 2018-11-16 DIAGNOSIS — J449 Chronic obstructive pulmonary disease, unspecified: Secondary | ICD-10-CM | POA: Diagnosis not present

## 2018-11-16 DIAGNOSIS — J9611 Chronic respiratory failure with hypoxia: Secondary | ICD-10-CM

## 2018-11-16 MED ORDER — ALBUTEROL SULFATE (2.5 MG/3ML) 0.083% IN NEBU: 2.5000 mg | INHALATION_SOLUTION | Freq: Four times a day (QID) | RESPIRATORY_TRACT | 11 refills | Status: DC | PRN

## 2018-11-16 MED ORDER — TRELEGY ELLIPTA 100-62.5-25 MCG/INH IN AEPB: 1.0000 | INHALATION_SPRAY | Freq: Every day | RESPIRATORY_TRACT | 0 refills | Status: DC

## 2018-11-16 MED ORDER — ALBUTEROL SULFATE HFA 108 (90 BASE) MCG/ACT IN AERS: 2.0000 | INHALATION_SPRAY | Freq: Four times a day (QID) | RESPIRATORY_TRACT | 11 refills | Status: DC | PRN
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
## 2018-11-16 NOTE — Patient Instructions (Addendum)
Thank you for visiting Dr. Carlis Abbott at [facility]. We recommend the following:  Call [facility] to make an appointment to be seed about your esophageal strictures.  Meds ordered this encounter  Medications  . Fluticasone-Umeclidin-Vilant (TRELEGY ELLIPTA) 100-62.5-25 MCG/INH AEPB    Sig: Inhale 1 puff into the lungs daily.    Dispense:  180 each    Refill:  0    Order Specific Question:   Lot Number?    Answer:   [id number]    Order Specific Question:   Manufacturer?    Answer:   [employer] [[id number]]  . albuterol (VENTOLIN HFA) 108 (90 Base) MCG/ACT inhaler    Sig: Inhale 2 puffs into the lungs every 6 (six) hours as needed for wheezing or shortness of breath.    Dispense:  6.7 g    Refill:  11  . albuterol (PROVENTIL) (2.5 MG/3ML) 0.083% nebulizer solution    Sig: Take 3 mLs (2.5 mg total) by nebulization every 6 (six) hours as needed for wheezing or shortness of breath.    Dispense:  300 mL    Refill:  11    Return in about 3 months (around 02/16/2019).    Please do your part to reduce the spread of COVID-19.

## 2018-11-23 ENCOUNTER — Telehealth: Payer: Self-pay | Admitting: Cardiology

## 2018-11-23 NOTE — Telephone Encounter (Signed)
  Pt c/o of Chest Pain: STAT if CP now or developed within 24 hours  1. Are you having CP right now? no  2. Are you experiencing any other symptoms (ex. SOB, nausea, vomiting, sweating)? lightheadedness on Friday but not since then, SOB, fatigue  3. How long have you been experiencing CP? Friday 11/20/18  4. Is your CP continuous or coming and going? Comes and goes  5. Have you taken Nitroglycerin? Yes, took one earlier this morning ?

## 2018-11-23 NOTE — Telephone Encounter (Signed)
Called pt. Stated she was having slight chest pressure that radiated to her arm with a tingling feeling. She states she is SOB while resting and when she is walking her O2 drops to 78-80%. She states her ankles are swollen and she has not taken lasix. She took one Nitro this morning and it relieved the pain. Advised pt to take another nitro and call 911 or have someone drive her to the hospital. Pt verbalized understanding.

## 2018-11-24 ENCOUNTER — Emergency Department (HOSPITAL_COMMUNITY): Payer: Medicare Other

## 2018-11-24 ENCOUNTER — Encounter (HOSPITAL_COMMUNITY): Payer: Self-pay | Admitting: Emergency Medicine

## 2018-11-24 ENCOUNTER — Other Ambulatory Visit: Payer: Self-pay

## 2018-11-24 ENCOUNTER — Inpatient Hospital Stay (HOSPITAL_COMMUNITY)
Admission: EM | Admit: 2018-11-24 | Discharge: 2018-11-27 | DRG: 286 | Disposition: A | Discharge status: Home / Self Care | Payer: Medicare Other | Attending: Internal Medicine | Admitting: Internal Medicine

## 2018-11-24 DIAGNOSIS — Z823 Family history of stroke: Secondary | ICD-10-CM

## 2018-11-24 DIAGNOSIS — Z887 Allergy status to serum and vaccine status: Secondary | ICD-10-CM

## 2018-11-24 DIAGNOSIS — I5043 Acute on chronic combined systolic (congestive) and diastolic (congestive) heart failure: Secondary | ICD-10-CM | POA: Diagnosis not present

## 2018-11-24 DIAGNOSIS — Z841 Family history of disorders of kidney and ureter: Secondary | ICD-10-CM

## 2018-11-24 DIAGNOSIS — I2510 Atherosclerotic heart disease of native coronary artery without angina pectoris: Secondary | ICD-10-CM | POA: Diagnosis present

## 2018-11-24 DIAGNOSIS — I509 Heart failure, unspecified: Secondary | ICD-10-CM | POA: Diagnosis not present

## 2018-11-24 DIAGNOSIS — I110 Hypertensive heart disease with heart failure: Principal | ICD-10-CM | POA: Diagnosis present

## 2018-11-24 DIAGNOSIS — Z886 Allergy status to analgesic agent status: Secondary | ICD-10-CM

## 2018-11-24 DIAGNOSIS — J441 Chronic obstructive pulmonary disease with (acute) exacerbation: Secondary | ICD-10-CM | POA: Diagnosis not present

## 2018-11-24 DIAGNOSIS — Z9981 Dependence on supplemental oxygen: Secondary | ICD-10-CM

## 2018-11-24 DIAGNOSIS — Z7901 Long term (current) use of anticoagulants: Secondary | ICD-10-CM

## 2018-11-24 DIAGNOSIS — J9621 Acute and chronic respiratory failure with hypoxia: Secondary | ICD-10-CM | POA: Diagnosis not present

## 2018-11-24 DIAGNOSIS — K449 Diaphragmatic hernia without obstruction or gangrene: Secondary | ICD-10-CM | POA: Diagnosis present

## 2018-11-24 DIAGNOSIS — H9190 Unspecified hearing loss, unspecified ear: Secondary | ICD-10-CM | POA: Diagnosis present

## 2018-11-24 DIAGNOSIS — Z20828 Contact with and (suspected) exposure to other viral communicable diseases: Secondary | ICD-10-CM | POA: Diagnosis present

## 2018-11-24 DIAGNOSIS — I200 Unstable angina: Secondary | ICD-10-CM | POA: Clinically undetermined

## 2018-11-24 DIAGNOSIS — I25110 Atherosclerotic heart disease of native coronary artery with unstable angina pectoris: Secondary | ICD-10-CM | POA: Diagnosis present

## 2018-11-24 DIAGNOSIS — Z8042 Family history of malignant neoplasm of prostate: Secondary | ICD-10-CM

## 2018-11-24 DIAGNOSIS — Z7952 Long term (current) use of systemic steroids: Secondary | ICD-10-CM

## 2018-11-24 DIAGNOSIS — I714 Abdominal aortic aneurysm, without rupture: Secondary | ICD-10-CM | POA: Diagnosis present

## 2018-11-24 DIAGNOSIS — R0789 Other chest pain: Secondary | ICD-10-CM | POA: Diagnosis not present

## 2018-11-24 DIAGNOSIS — Z8249 Family history of ischemic heart disease and other diseases of the circulatory system: Secondary | ICD-10-CM

## 2018-11-24 DIAGNOSIS — E039 Hypothyroidism, unspecified: Secondary | ICD-10-CM | POA: Diagnosis present

## 2018-11-24 DIAGNOSIS — I70201 Unspecified atherosclerosis of native arteries of extremities, right leg: Secondary | ICD-10-CM | POA: Diagnosis present

## 2018-11-24 DIAGNOSIS — E785 Hyperlipidemia, unspecified: Secondary | ICD-10-CM | POA: Diagnosis present

## 2018-11-24 DIAGNOSIS — I255 Ischemic cardiomyopathy: Secondary | ICD-10-CM | POA: Diagnosis present

## 2018-11-24 DIAGNOSIS — Z79890 Hormone replacement therapy (postmenopausal): Secondary | ICD-10-CM

## 2018-11-24 DIAGNOSIS — E876 Hypokalemia: Secondary | ICD-10-CM | POA: Diagnosis present

## 2018-11-24 DIAGNOSIS — I5032 Chronic diastolic (congestive) heart failure: Secondary | ICD-10-CM | POA: Diagnosis present

## 2018-11-24 DIAGNOSIS — Z91013 Allergy to seafood: Secondary | ICD-10-CM

## 2018-11-24 DIAGNOSIS — Z87891 Personal history of nicotine dependence: Secondary | ICD-10-CM

## 2018-11-24 DIAGNOSIS — I10 Essential (primary) hypertension: Secondary | ICD-10-CM | POA: Diagnosis present

## 2018-11-24 DIAGNOSIS — J449 Chronic obstructive pulmonary disease, unspecified: Secondary | ICD-10-CM | POA: Diagnosis present

## 2018-11-24 DIAGNOSIS — I208 Other forms of angina pectoris: Secondary | ICD-10-CM | POA: Clinically undetermined

## 2018-11-24 DIAGNOSIS — I25119 Atherosclerotic heart disease of native coronary artery with unspecified angina pectoris: Secondary | ICD-10-CM | POA: Diagnosis present

## 2018-11-24 DIAGNOSIS — I480 Paroxysmal atrial fibrillation: Secondary | ICD-10-CM | POA: Diagnosis present

## 2018-11-24 LAB — BASIC METABOLIC PANEL
Anion gap: 10 (ref 5–15)
BUN: 6 mg/dL — ABNORMAL LOW (ref 8–23)
CO2: 25 mmol/L (ref 22–32)
Calcium: 8.9 mg/dL (ref 8.9–10.3)
Chloride: 100 mmol/L (ref 98–111)
Creatinine, Ser: 0.98 mg/dL (ref 0.44–1.00)
GFR calc Af Amer: >60 mL/min (ref >60)
GFR calc non Af Amer: 58 mL/min — ABNORMAL LOW (ref >60)
Glucose, Bld: 84 mg/dL (ref 70–99)
Potassium: 3.3 mmol/L — ABNORMAL LOW (ref 3.5–5.1)
Sodium: 135 mmol/L (ref 135–145)

## 2018-11-24 LAB — CBC
HCT: 35.5 % — ABNORMAL LOW (ref 36.0–46.0)
Hemoglobin: 10.9 g/dL — ABNORMAL LOW (ref 12.0–15.0)
MCH: 28.5 pg (ref 26.0–34.0)
MCHC: 30.7 g/dL (ref 30.0–36.0)
MCV: 92.7 fL (ref 80.0–100.0)
Platelets: 232 10*3/uL (ref 150–400)
RBC: 3.83 MIL/uL — ABNORMAL LOW (ref 3.87–5.11)
RDW: 13.3 % (ref 11.5–15.5)
WBC: 9 10*3/uL (ref 4.0–10.5)
nRBC: 0 % (ref 0.0–0.2)

## 2018-11-24 LAB — TROPONIN I (HIGH SENSITIVITY): Troponin I (High Sensitivity): 17 ng/L (ref <18)

## 2018-11-24 MED ORDER — SODIUM CHLORIDE 0.9% FLUSH: 3.0000 mL | Freq: Once | INTRAVENOUS | Status: DC

## 2018-11-24 NOTE — Telephone Encounter (Signed)
Add Imdur 15 mg a day.  If she still having chest pain she needs to be seen this week by an APP and I suspect I will need to do a heart cath on her at some point next week.  If she has remained stable I can see her in the office next Tuesday.

## 2018-11-24 NOTE — ED Triage Notes (Signed)
Patient reports chest pressure with SOB this week , denies cough or fever , no emesis or diaphoresis .

## 2018-11-24 NOTE — Telephone Encounter (Signed)
Called pt to check to see if she was still having chest pains and SOB. Pt never went to ER. Was going to give Dr Kennon Holter recommendations but with symptoms, DOD and Dr Gwenlyn Found agreed that hospital was best option. Pt verbalized understanding to call 911.

## 2018-11-25 ENCOUNTER — Encounter (HOSPITAL_COMMUNITY): Payer: Self-pay | Admitting: Internal Medicine

## 2018-11-25 ENCOUNTER — Inpatient Hospital Stay (HOSPITAL_COMMUNITY): Payer: Medicare Other

## 2018-11-25 DIAGNOSIS — I10 Essential (primary) hypertension: Secondary | ICD-10-CM | POA: Diagnosis not present

## 2018-11-25 DIAGNOSIS — I255 Ischemic cardiomyopathy: Secondary | ICD-10-CM | POA: Diagnosis not present

## 2018-11-25 DIAGNOSIS — Z79890 Hormone replacement therapy (postmenopausal): Secondary | ICD-10-CM | POA: Diagnosis not present

## 2018-11-25 DIAGNOSIS — I110 Hypertensive heart disease with heart failure: Secondary | ICD-10-CM | POA: Diagnosis not present

## 2018-11-25 DIAGNOSIS — I5032 Chronic diastolic (congestive) heart failure: Secondary | ICD-10-CM | POA: Diagnosis not present

## 2018-11-25 DIAGNOSIS — I361 Nonrheumatic tricuspid (valve) insufficiency: Secondary | ICD-10-CM | POA: Diagnosis not present

## 2018-11-25 DIAGNOSIS — I25110 Atherosclerotic heart disease of native coronary artery with unstable angina pectoris: Secondary | ICD-10-CM | POA: Diagnosis not present

## 2018-11-25 DIAGNOSIS — I714 Abdominal aortic aneurysm, without rupture: Secondary | ICD-10-CM | POA: Diagnosis not present

## 2018-11-25 DIAGNOSIS — Z7952 Long term (current) use of systemic steroids: Secondary | ICD-10-CM | POA: Diagnosis not present

## 2018-11-25 DIAGNOSIS — R0789 Other chest pain: Secondary | ICD-10-CM | POA: Diagnosis not present

## 2018-11-25 DIAGNOSIS — R0600 Dyspnea, unspecified: Secondary | ICD-10-CM | POA: Diagnosis not present

## 2018-11-25 DIAGNOSIS — I70201 Unspecified atherosclerosis of native arteries of extremities, right leg: Secondary | ICD-10-CM | POA: Diagnosis not present

## 2018-11-25 DIAGNOSIS — Z823 Family history of stroke: Secondary | ICD-10-CM | POA: Diagnosis not present

## 2018-11-25 DIAGNOSIS — K449 Diaphragmatic hernia without obstruction or gangrene: Secondary | ICD-10-CM | POA: Diagnosis not present

## 2018-11-25 DIAGNOSIS — I208 Other forms of angina pectoris: Secondary | ICD-10-CM | POA: Diagnosis not present

## 2018-11-25 DIAGNOSIS — Z886 Allergy status to analgesic agent status: Secondary | ICD-10-CM | POA: Diagnosis not present

## 2018-11-25 DIAGNOSIS — I340 Nonrheumatic mitral (valve) insufficiency: Secondary | ICD-10-CM | POA: Diagnosis not present

## 2018-11-25 DIAGNOSIS — J9621 Acute and chronic respiratory failure with hypoxia: Secondary | ICD-10-CM | POA: Diagnosis not present

## 2018-11-25 DIAGNOSIS — Z7901 Long term (current) use of anticoagulants: Secondary | ICD-10-CM | POA: Diagnosis not present

## 2018-11-25 DIAGNOSIS — J42 Unspecified chronic bronchitis: Secondary | ICD-10-CM | POA: Diagnosis not present

## 2018-11-25 DIAGNOSIS — Z887 Allergy status to serum and vaccine status: Secondary | ICD-10-CM | POA: Diagnosis not present

## 2018-11-25 DIAGNOSIS — Z841 Family history of disorders of kidney and ureter: Secondary | ICD-10-CM | POA: Diagnosis not present

## 2018-11-25 DIAGNOSIS — I5043 Acute on chronic combined systolic (congestive) and diastolic (congestive) heart failure: Secondary | ICD-10-CM | POA: Diagnosis not present

## 2018-11-25 DIAGNOSIS — Z20828 Contact with and (suspected) exposure to other viral communicable diseases: Secondary | ICD-10-CM | POA: Diagnosis not present

## 2018-11-25 DIAGNOSIS — I480 Paroxysmal atrial fibrillation: Secondary | ICD-10-CM | POA: Diagnosis present

## 2018-11-25 DIAGNOSIS — M7989 Other specified soft tissue disorders: Secondary | ICD-10-CM | POA: Diagnosis not present

## 2018-11-25 DIAGNOSIS — E039 Hypothyroidism, unspecified: Secondary | ICD-10-CM | POA: Diagnosis present

## 2018-11-25 DIAGNOSIS — I25118 Atherosclerotic heart disease of native coronary artery with other forms of angina pectoris: Secondary | ICD-10-CM | POA: Diagnosis not present

## 2018-11-25 DIAGNOSIS — J441 Chronic obstructive pulmonary disease with (acute) exacerbation: Secondary | ICD-10-CM | POA: Diagnosis not present

## 2018-11-25 DIAGNOSIS — I200 Unstable angina: Secondary | ICD-10-CM | POA: Diagnosis not present

## 2018-11-25 DIAGNOSIS — I25119 Atherosclerotic heart disease of native coronary artery with unspecified angina pectoris: Secondary | ICD-10-CM | POA: Diagnosis not present

## 2018-11-25 DIAGNOSIS — E785 Hyperlipidemia, unspecified: Secondary | ICD-10-CM | POA: Diagnosis not present

## 2018-11-25 DIAGNOSIS — Z8249 Family history of ischemic heart disease and other diseases of the circulatory system: Secondary | ICD-10-CM | POA: Diagnosis not present

## 2018-11-25 DIAGNOSIS — Z8042 Family history of malignant neoplasm of prostate: Secondary | ICD-10-CM | POA: Diagnosis not present

## 2018-11-25 DIAGNOSIS — Z87891 Personal history of nicotine dependence: Secondary | ICD-10-CM | POA: Diagnosis not present

## 2018-11-25 DIAGNOSIS — H9190 Unspecified hearing loss, unspecified ear: Secondary | ICD-10-CM | POA: Diagnosis not present

## 2018-11-25 DIAGNOSIS — Z91013 Allergy to seafood: Secondary | ICD-10-CM | POA: Diagnosis not present

## 2018-11-25 LAB — SARS CORONAVIRUS 2 (TAT 6-24 HRS): SARS Coronavirus 2: NEGATIVE

## 2018-11-25 LAB — BRAIN NATRIURETIC PEPTIDE: B Natriuretic Peptide: 318.2 pg/mL — ABNORMAL HIGH (ref 0.0–100.0)

## 2018-11-25 LAB — ECHOCARDIOGRAM COMPLETE

## 2018-11-25 LAB — TROPONIN I (HIGH SENSITIVITY): Troponin I (High Sensitivity): 21 ng/L — ABNORMAL HIGH (ref <18)

## 2018-11-25 MED ORDER — ALBUTEROL SULFATE (2.5 MG/3ML) 0.083% IN NEBU: 2.5000 mg | INHALATION_SOLUTION | RESPIRATORY_TRACT | Status: DC | PRN

## 2018-11-25 MED ORDER — ONDANSETRON HCL 4 MG/2ML IJ SOLN: 4.0000 mg | Freq: Four times a day (QID) | INTRAMUSCULAR | Status: DC | PRN

## 2018-11-25 MED ORDER — ATORVASTATIN CALCIUM 40 MG PO TABS
40.0000 mg | ORAL_TABLET | Freq: Every day | ORAL | Status: DC
  Administered 2018-11-25 – 2018-11-26 (×2): 40 mg via ORAL
  Filled 2018-11-25 (×2): qty 1

## 2018-11-25 MED ORDER — FUROSEMIDE 10 MG/ML IJ SOLN: 40.0000 mg | Freq: Every day | INTRAMUSCULAR | Status: DC

## 2018-11-25 MED ORDER — APIXABAN 5 MG PO TABS
5.0000 mg | ORAL_TABLET | Freq: Two times a day (BID) | ORAL | Status: DC
  Filled 2018-11-25: qty 1

## 2018-11-25 MED ORDER — UMECLIDINIUM BROMIDE 62.5 MCG/INH IN AEPB
1.0000 | INHALATION_SPRAY | Freq: Every day | RESPIRATORY_TRACT | Status: DC
  Administered 2018-11-26 – 2018-11-27 (×2): 1 via RESPIRATORY_TRACT
  Filled 2018-11-25: qty 7

## 2018-11-25 MED ORDER — PANTOPRAZOLE SODIUM 40 MG PO TBEC
40.0000 mg | DELAYED_RELEASE_TABLET | Freq: Every day | ORAL | Status: DC
  Administered 2018-11-27: 40 mg via ORAL
  Filled 2018-11-25: qty 1

## 2018-11-25 MED ORDER — LEVOTHYROXINE SODIUM 25 MCG PO TABS
25.0000 ug | ORAL_TABLET | Freq: Every day | ORAL | Status: DC
  Administered 2018-11-26 – 2018-11-27 (×2): 25 ug via ORAL
  Filled 2018-11-25 (×2): qty 1

## 2018-11-25 MED ORDER — FUROSEMIDE 10 MG/ML IJ SOLN: 40.0000 mg | Freq: Two times a day (BID) | INTRAMUSCULAR | Status: DC

## 2018-11-25 MED ORDER — PREDNISONE 20 MG PO TABS
60.0000 mg | ORAL_TABLET | Freq: Once | ORAL | Status: AC
  Administered 2018-11-25: 60 mg via ORAL
  Filled 2018-11-25: qty 3

## 2018-11-25 MED ORDER — ACETAMINOPHEN 325 MG PO TABS
650.0000 mg | ORAL_TABLET | ORAL | Status: DC | PRN
  Administered 2018-11-25: 650 mg via ORAL
  Filled 2018-11-25: qty 2

## 2018-11-25 MED ORDER — ATORVASTATIN CALCIUM 10 MG PO TABS: 20.0000 mg | ORAL_TABLET | Freq: Every day | ORAL | Status: DC

## 2018-11-25 MED ORDER — SODIUM CHLORIDE 0.9% FLUSH: 3.0000 mL | INTRAVENOUS | Status: DC | PRN

## 2018-11-25 MED ORDER — CARVEDILOL 25 MG PO TABS
25.0000 mg | ORAL_TABLET | Freq: Two times a day (BID) | ORAL | Status: DC
  Administered 2018-11-25 – 2018-11-27 (×4): 25 mg via ORAL
  Filled 2018-11-25 (×4): qty 1

## 2018-11-25 MED ORDER — ALBUTEROL SULFATE HFA 108 (90 BASE) MCG/ACT IN AERS
2.0000 | INHALATION_SPRAY | RESPIRATORY_TRACT | Status: DC | PRN
  Administered 2018-11-25: 2 via RESPIRATORY_TRACT
  Filled 2018-11-25: qty 6.7

## 2018-11-25 MED ORDER — SODIUM CHLORIDE 0.9% FLUSH
3.0000 mL | Freq: Two times a day (BID) | INTRAVENOUS | Status: DC
  Administered 2018-11-25: 3 mL via INTRAVENOUS

## 2018-11-25 MED ORDER — ENOXAPARIN SODIUM 40 MG/0.4ML ~~LOC~~ SOLN: 40.0000 mg | SUBCUTANEOUS | Status: DC

## 2018-11-25 MED ORDER — SODIUM CHLORIDE 0.9 % IV SOLN: 250.0000 mL | INTRAVENOUS | Status: DC | PRN

## 2018-11-25 MED ORDER — POTASSIUM CHLORIDE CRYS ER 20 MEQ PO TBCR
40.0000 meq | EXTENDED_RELEASE_TABLET | Freq: Once | ORAL | Status: AC
  Administered 2018-11-25: 40 meq via ORAL
  Filled 2018-11-25: qty 2

## 2018-11-25 MED ORDER — IPRATROPIUM-ALBUTEROL 0.5-2.5 (3) MG/3ML IN SOLN
3.0000 mL | Freq: Four times a day (QID) | RESPIRATORY_TRACT | Status: DC
  Administered 2018-11-26: 3 mL via RESPIRATORY_TRACT
  Filled 2018-11-25 (×2): qty 3

## 2018-11-25 MED ORDER — PRAVASTATIN SODIUM 10 MG PO TABS: 20.0000 mg | ORAL_TABLET | Freq: Every evening | ORAL | Status: DC

## 2018-11-25 MED ORDER — FLUTICASONE-UMECLIDIN-VILANT 100-62.5-25 MCG/INH IN AEPB: 1.0000 | INHALATION_SPRAY | Freq: Every day | RESPIRATORY_TRACT | Status: DC

## 2018-11-25 MED ORDER — FUROSEMIDE 10 MG/ML IJ SOLN
40.0000 mg | Freq: Once | INTRAMUSCULAR | Status: AC
  Administered 2018-11-25: 40 mg via INTRAVENOUS
  Filled 2018-11-25: qty 4

## 2018-11-25 MED ORDER — FLUTICASONE FUROATE-VILANTEROL 100-25 MCG/INH IN AEPB
1.0000 | INHALATION_SPRAY | Freq: Every day | RESPIRATORY_TRACT | Status: DC
  Filled 2018-11-25 (×2): qty 28
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
## 2018-11-25 NOTE — ED Notes (Signed)
Pt needs extensive assistance when going to the bathroom. Pts o2 drops in the 70s when ambulating. Pt becomes exteremly SOB and becomes weak. Pt refused bedpan and purewick. This tech notified elizabeth, Therapist, sports.

## 2018-11-25 NOTE — Progress Notes (Signed)
  Echocardiogram 2D Echocardiogram has been performed.  Jennette Dubin 11/25/2018, 3:52 PM

## 2018-11-25 NOTE — ED Notes (Signed)
Lunch tray ordered 

## 2018-11-25 NOTE — Consult Note (Addendum)
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 medicines for now but hold Eliquis for cardiac cath.   Procedure: RIGHT AND LEFT HEART CATHETERIZATION WITH NATIVE CORONARY ANGIOGRAPHY AND POSSIBLE PERCUTANEOUS CORONARY INTERVENTION  The procedure with Risks/Benefits/Alternatives and Indications was reviewed with the patient .  All questions were answered.    Risks / Complications include, but not limited to: Death, MI, CVA/TIA, VF/VT (with defibrillation), Bradycardia (need for temporary pacer placement), contrast induced nephropathy, bleeding / bruising / hematoma / pseudoaneurysm, vascular or coronary injury (with possible emergent CT or Vascular Surgery), adverse medication reactions, infection.  Additional risks involving the use of radiation with the possibility of radiation burns and cancer were explained in  detail.  The patient voices understanding and agree to proceed.  Consent form signed by MD and patient along with RN witness and placed on chart.       Glenetta Hew, M.D., M.S. Interventional Cardiologist   Pager # [phone] Phone # [phone] [address]. [place], [place] [address]

## 2018-11-25 NOTE — ED Notes (Signed)
Placed pt on 3 L O2

## 2018-11-25 NOTE — ED Notes (Signed)
Pt had

## 2018-11-26 ENCOUNTER — Encounter (HOSPITAL_COMMUNITY)
Admission: EM | Disposition: A | Discharge status: Home / Self Care | Payer: Self-pay | Source: Home / Self Care | Attending: Internal Medicine

## 2018-11-26 ENCOUNTER — Inpatient Hospital Stay (HOSPITAL_COMMUNITY): Payer: Medicare Other

## 2018-11-26 DIAGNOSIS — M7989 Other specified soft tissue disorders: Secondary | ICD-10-CM | POA: Diagnosis not present

## 2018-11-26 DIAGNOSIS — R0600 Dyspnea, unspecified: Secondary | ICD-10-CM

## 2018-11-26 DIAGNOSIS — I208 Other forms of angina pectoris: Secondary | ICD-10-CM | POA: Clinically undetermined

## 2018-11-26 DIAGNOSIS — J42 Unspecified chronic bronchitis: Secondary | ICD-10-CM | POA: Diagnosis not present

## 2018-11-26 DIAGNOSIS — J9621 Acute and chronic respiratory failure with hypoxia: Secondary | ICD-10-CM | POA: Diagnosis not present

## 2018-11-26 DIAGNOSIS — I5032 Chronic diastolic (congestive) heart failure: Secondary | ICD-10-CM | POA: Diagnosis not present

## 2018-11-26 DIAGNOSIS — I25118 Atherosclerotic heart disease of native coronary artery with other forms of angina pectoris: Secondary | ICD-10-CM

## 2018-11-26 DIAGNOSIS — E785 Hyperlipidemia, unspecified: Secondary | ICD-10-CM

## 2018-11-26 DIAGNOSIS — I2089 Other forms of angina pectoris: Secondary | ICD-10-CM | POA: Clinically undetermined

## 2018-11-26 DIAGNOSIS — I10 Essential (primary) hypertension: Secondary | ICD-10-CM

## 2018-11-26 HISTORY — PX: RIGHT/LEFT HEART CATH AND CORONARY ANGIOGRAPHY: CATH118266

## 2018-11-26 LAB — CBC WITH DIFFERENTIAL/PLATELET
Abs Immature Granulocytes: 0.01 10*3/uL (ref 0.00–0.07)
Basophils Absolute: 0 10*3/uL (ref 0.0–0.1)
Basophils Relative: 0 %
Eosinophils Absolute: 0 10*3/uL (ref 0.0–0.5)
Eosinophils Relative: 0 %
HCT: 34 % — ABNORMAL LOW (ref 36.0–46.0)
Hemoglobin: 10.7 g/dL — ABNORMAL LOW (ref 12.0–15.0)
Immature Granulocytes: 0 %
Lymphocytes Relative: 23 %
Lymphs Abs: 1.6 10*3/uL (ref 0.7–4.0)
MCH: 28.7 pg (ref 26.0–34.0)
MCHC: 31.5 g/dL (ref 30.0–36.0)
MCV: 91.2 fL (ref 80.0–100.0)
Monocytes Absolute: 0.6 10*3/uL (ref 0.1–1.0)
Monocytes Relative: 8 %
Neutro Abs: 4.8 10*3/uL (ref 1.7–7.7)
Neutrophils Relative %: 69 %
Platelets: 221 10*3/uL (ref 150–400)
RBC: 3.73 MIL/uL — ABNORMAL LOW (ref 3.87–5.11)
RDW: 13 % (ref 11.5–15.5)
WBC: 6.9 10*3/uL (ref 4.0–10.5)
nRBC: 0 % (ref 0.0–0.2)

## 2018-11-26 LAB — POCT I-STAT 7, (LYTES, BLD GAS, ICA,H+H)
Acid-Base Excess: 1 mmol/L (ref 0.0–2.0)
Bicarbonate: 28.9 mmol/L — ABNORMAL HIGH (ref 20.0–28.0)
Calcium, Ion: 1.28 mmol/L (ref 1.15–1.40)
HCT: 31 % — ABNORMAL LOW (ref 36.0–46.0)
Hemoglobin: 10.5 g/dL — ABNORMAL LOW (ref 12.0–15.0)
O2 Saturation: 99 %
Potassium: 4.3 mmol/L (ref 3.5–5.1)
Sodium: 140 mmol/L (ref 135–145)
TCO2: 31 mmol/L (ref 22–32)
pCO2 arterial: 60.4 mmHg — ABNORMAL HIGH (ref 32.0–48.0)
pH, Arterial: 7.288 — ABNORMAL LOW (ref 7.350–7.450)
pO2, Arterial: 182 mmHg — ABNORMAL HIGH (ref 83.0–108.0)

## 2018-11-26 LAB — BASIC METABOLIC PANEL
Anion gap: 8 (ref 5–15)
BUN: 20 mg/dL (ref 8–23)
CO2: 28 mmol/L (ref 22–32)
Calcium: 8.8 mg/dL — ABNORMAL LOW (ref 8.9–10.3)
Chloride: 101 mmol/L (ref 98–111)
Creatinine, Ser: 1.05 mg/dL — ABNORMAL HIGH (ref 0.44–1.00)
GFR calc Af Amer: >60 mL/min (ref >60)
GFR calc non Af Amer: 53 mL/min — ABNORMAL LOW (ref >60)
Glucose, Bld: 121 mg/dL — ABNORMAL HIGH (ref 70–99)
Potassium: 4.5 mmol/L (ref 3.5–5.1)
Sodium: 137 mmol/L (ref 135–145)

## 2018-11-26 LAB — POCT I-STAT EG7
Acid-Base Excess: 1 mmol/L (ref 0.0–2.0)
Bicarbonate: 28.8 mmol/L — ABNORMAL HIGH (ref 20.0–28.0)
Calcium, Ion: 1.32 mmol/L (ref 1.15–1.40)
HCT: 31 % — ABNORMAL LOW (ref 36.0–46.0)
Hemoglobin: 10.5 g/dL — ABNORMAL LOW (ref 12.0–15.0)
O2 Saturation: 76 %
Potassium: 4.3 mmol/L (ref 3.5–5.1)
Sodium: 141 mmol/L (ref 135–145)
TCO2: 31 mmol/L (ref 22–32)
pCO2, Ven: 64.3 mmHg — ABNORMAL HIGH (ref 44.0–60.0)
pH, Ven: 7.259 (ref 7.250–7.430)
pO2, Ven: 48 mmHg — ABNORMAL HIGH (ref 32.0–45.0)

## 2018-11-26 LAB — HIV ANTIBODY (ROUTINE TESTING W REFLEX): HIV Screen 4th Generation wRfx: NONREACTIVE

## 2018-11-26 SURGERY — RIGHT/LEFT HEART CATH AND CORONARY ANGIOGRAPHY
Anesthesia: LOCAL

## 2018-11-26 MED ORDER — SODIUM CHLORIDE 0.9 % WEIGHT BASED INFUSION: 1.0000 mL/kg/h | INTRAVENOUS | Status: DC

## 2018-11-26 MED ORDER — VERAPAMIL HCL 2.5 MG/ML IV SOLN
INTRAVENOUS | Status: AC
  Filled 2018-11-26: qty 2

## 2018-11-26 MED ORDER — FENTANYL CITRATE (PF) 100 MCG/2ML IJ SOLN
INTRAMUSCULAR | Status: AC
  Filled 2018-11-26: qty 2

## 2018-11-26 MED ORDER — MIDAZOLAM HCL 2 MG/2ML IJ SOLN
INTRAMUSCULAR | Status: AC
  Filled 2018-11-26: qty 2

## 2018-11-26 MED ORDER — ASPIRIN EC 81 MG PO TBEC: 81.0000 mg | DELAYED_RELEASE_TABLET | Freq: Every day | ORAL | Status: DC

## 2018-11-26 MED ORDER — SODIUM CHLORIDE 0.9 % IV SOLN: INTRAVENOUS | Status: AC

## 2018-11-26 MED ORDER — PROMETHAZINE HCL 25 MG/ML IJ SOLN: 12.5000 mg | Freq: Four times a day (QID) | INTRAMUSCULAR | Status: DC | PRN

## 2018-11-26 MED ORDER — LIDOCAINE HCL (PF) 1 % IJ SOLN
INTRAMUSCULAR | Status: AC
  Filled 2018-11-26: qty 30

## 2018-11-26 MED ORDER — LIDOCAINE HCL (PF) 1 % IJ SOLN
INTRAMUSCULAR | Status: DC | PRN
  Administered 2018-11-26 (×2): 2 mL

## 2018-11-26 MED ORDER — SODIUM CHLORIDE 0.9 % IV SOLN: 250.0000 mL | INTRAVENOUS | Status: DC | PRN

## 2018-11-26 MED ORDER — MIDAZOLAM HCL 2 MG/2ML IJ SOLN
INTRAMUSCULAR | Status: DC | PRN
  Administered 2018-11-26: 1 mg via INTRAVENOUS

## 2018-11-26 MED ORDER — HEPARIN (PORCINE) IN NACL 1000-0.9 UT/500ML-% IV SOLN
INTRAVENOUS | Status: DC | PRN
  Administered 2018-11-26 (×2): 500 mL

## 2018-11-26 MED ORDER — IOHEXOL 350 MG/ML SOLN
INTRAVENOUS | Status: DC | PRN
  Administered 2018-11-26: 40 mL

## 2018-11-26 MED ORDER — HEPARIN SODIUM (PORCINE) 1000 UNIT/ML IJ SOLN
INTRAMUSCULAR | Status: DC | PRN
  Administered 2018-11-26: 2500 [IU] via INTRAVENOUS

## 2018-11-26 MED ORDER — LABETALOL HCL 5 MG/ML IV SOLN: 10.0000 mg | INTRAVENOUS | Status: AC | PRN

## 2018-11-26 MED ORDER — SODIUM CHLORIDE 0.9% FLUSH
3.0000 mL | Freq: Two times a day (BID) | INTRAVENOUS | Status: DC
  Administered 2018-11-26: 3 mL via INTRAVENOUS

## 2018-11-26 MED ORDER — HEPARIN SODIUM (PORCINE) 1000 UNIT/ML IJ SOLN
INTRAMUSCULAR | Status: AC
  Filled 2018-11-26: qty 1

## 2018-11-26 MED ORDER — SODIUM CHLORIDE 0.9% FLUSH: 3.0000 mL | INTRAVENOUS | Status: DC | PRN

## 2018-11-26 MED ORDER — ACETAMINOPHEN 325 MG PO TABS: 650.0000 mg | ORAL_TABLET | ORAL | Status: DC | PRN

## 2018-11-26 MED ORDER — SODIUM CHLORIDE 0.9 % WEIGHT BASED INFUSION: 3.0000 mL/kg/h | INTRAVENOUS | Status: DC

## 2018-11-26 MED ORDER — HYDRALAZINE HCL 20 MG/ML IJ SOLN: 10.0000 mg | INTRAMUSCULAR | Status: AC | PRN

## 2018-11-26 MED ORDER — IPRATROPIUM-ALBUTEROL 0.5-2.5 (3) MG/3ML IN SOLN
3.0000 mL | Freq: Three times a day (TID) | RESPIRATORY_TRACT | Status: DC
  Administered 2018-11-26 – 2018-11-27 (×4): 3 mL via RESPIRATORY_TRACT
  Filled 2018-11-26 (×5): qty 3

## 2018-11-26 MED ORDER — ASPIRIN 81 MG PO CHEW
81.0000 mg | CHEWABLE_TABLET | ORAL | Status: AC
  Administered 2018-11-26: 81 mg via ORAL
  Filled 2018-11-26: qty 1

## 2018-11-26 MED ORDER — FENTANYL CITRATE (PF) 100 MCG/2ML IJ SOLN
INTRAMUSCULAR | Status: DC | PRN
  Administered 2018-11-26: 25 ug via INTRAVENOUS

## 2018-11-26 MED ORDER — HEPARIN (PORCINE) IN NACL 1000-0.9 UT/500ML-% IV SOLN
INTRAVENOUS | Status: AC
  Filled 2018-11-26: qty 1000

## 2018-11-26 MED ORDER — ONDANSETRON HCL 4 MG/2ML IJ SOLN: 4.0000 mg | Freq: Four times a day (QID) | INTRAMUSCULAR | Status: DC | PRN

## 2018-11-26 MED ORDER — ASPIRIN EC 81 MG PO TBEC
81.0000 mg | DELAYED_RELEASE_TABLET | Freq: Every day | ORAL | Status: DC
  Administered 2018-11-27: 81 mg via ORAL
  Filled 2018-11-26: qty 1

## 2018-11-26 MED ORDER — SODIUM CHLORIDE 0.9 % WEIGHT BASED INFUSION
3.0000 mL/kg/h | INTRAVENOUS | Status: DC
  Administered 2018-11-26: 3 mL/kg/h via INTRAVENOUS

## 2018-11-26 MED ORDER — SODIUM CHLORIDE 0.9% FLUSH
3.0000 mL | Freq: Two times a day (BID) | INTRAVENOUS | Status: DC
  Administered 2018-11-27: 3 mL via INTRAVENOUS

## 2018-11-26 MED ORDER — VERAPAMIL HCL 2.5 MG/ML IV SOLN
INTRAVENOUS | Status: DC | PRN
  Administered 2018-11-26: 10 mL via INTRA_ARTERIAL

## 2018-11-26 SURGICAL SUPPLY — 13 items
CATH 5FR JL3.5 JR4 ANG PIG MP (CATHETERS) ×1 IMPLANT
CATH BALLN WEDGE 5F 110CM (CATHETERS) ×1 IMPLANT
DEVICE RAD TR BAND REGULAR (VASCULAR PRODUCTS) ×1 IMPLANT
GLIDESHEATH SLEND SS 6F .021 (SHEATH) ×1 IMPLANT
GUIDEWIRE INQWIRE 1.5J.035X260 (WIRE) IMPLANT
INQWIRE 1.5J .035X260CM (WIRE) ×2
KIT HEART LEFT (KITS) ×2 IMPLANT
PACK CARDIAC CATHETERIZATION (CUSTOM PROCEDURE TRAY) ×2 IMPLANT
SHEATH GLIDE SLENDER 4/5FR (SHEATH) ×1 IMPLANT
SHEATH PROBE COVER 6X72 (BAG) ×1 IMPLANT
TRANSDUCER W/STOPCOCK (MISCELLANEOUS) ×2 IMPLANT
TUBING CIL FLEX 10 FLL-RA (TUBING) ×2 IMPLANT
WIRE EMERALD 3MM-J .025X260CM (WIRE) ×1 IMPLANT

## 2018-11-26 NOTE — Progress Notes (Signed)
Initial Nutrition Assessment  DOCUMENTATION CODES:   Underweight  INTERVENTION:   Once diet advanced: -Ensure Enlive po TID, each supplement provides 350 kcal and 20 grams of protein -Multivitamin with minerals daily  NUTRITION DIAGNOSIS:   Increased nutrient needs related to chronic illness(COPD, CHF) as evidenced by estimated needs.  GOAL:   Patient will meet greater than or equal to 90% of their needs  MONITOR:   PO intake, Supplement acceptance, Diet advancement, Labs, Weight trends, I & O's  REASON FOR ASSESSMENT:   Consult COPD Protocol  ASSESSMENT:   73 y.o. female with medical history significant of PAD; CAD; HTN; HLD; chronic systolic CHF; and COPD previously on [place] O2 presenting with SOB.  She reports SOB, chest pressure, LE edema.  Symptoms started Friday, but they worsened Sunday.  **RD working remotely**  Patient currently NPO for planned cardiac cath today. Pt with history of severe malnutrition and underweight status. Once diet is advanced, pt would benefit from protein supplements and daily MVI.   Per weight records, pt's weight has remained stable over the past year. Current weight: 80 lbs.   I/Os: +75 ml since admit UOP: 225 ml x 24 hrs  Labs reviewed. Medications reviewed.  NUTRITION - FOCUSED PHYSICAL EXAM:  Unable to perform -working remotely.  Diet Order:   Diet Order            Diet NPO time specified Except for: Sips with Meds  Diet effective midnight              EDUCATION NEEDS:   No education needs have been identified at this time  Skin:  Skin Assessment: Reviewed RN Assessment  Last BM:  10/29  Height:   Ht Readings from Last 1 Encounters:  11/26/18 5\' 4"  (1.626 m)    Weight:   Wt Readings from Last 1 Encounters:  11/26/18 36.4 kg    Ideal Body Weight:  54.5 kg  BMI:  Body mass index is 13.77 kg/m.  Estimated Nutritional Needs:   Kcal:  1400-1600  Protein:  65-75g  Fluid:  1.6L/day  Clayton Bibles,  MS, RD, LDN Inpatient Clinical Dietitian Pager: [phone] After Hours Pager: [phone]

## 2018-11-26 NOTE — Interval H&P Note (Signed)
Cath Lab Visit (complete for each Cath Lab visit)  Clinical Evaluation Leading to the Procedure:   ACS: Yes.    Non-ACS:    Anginal Classification: CCS IV  Anti-ischemic medical therapy: Minimal Therapy (1 class of medications)  Non-Invasive Test Results: No non-invasive testing performed  Prior CABG: No previous CABG      History and Physical Interval Note:  11/26/2018 2:03 PM  Tonya Harper  has presented today for surgery, with the diagnosis of angina.  The various methods of treatment have been discussed with the patient and family. After consideration of risks, benefits and other options for treatment, the patient has consented to  Procedure(s): RIGHT/LEFT HEART CATH AND CORONARY ANGIOGRAPHY (N/A) as a surgical intervention.  The patient's history has been reviewed, patient examined, no change in status, stable for surgery.  I have reviewed the patient's chart and labs.  Questions were answered to the patient's satisfaction.     Larae Grooms

## 2018-11-26 NOTE — Progress Notes (Addendum)
Progress Note  Patient Name: Tonya Harper Date of Encounter: 11/26/2018  Primary Cardiologist: Kirk Ruths, MD   Subjective   Very mild chest pressure this morning.   Inpatient Medications    Scheduled Meds: . aspirin EC  81 mg Oral Daily  . atorvastatin  40 mg Oral q1800  . carvedilol  25 mg Oral BID WC  . fluticasone furoate-vilanterol  1 puff Inhalation Daily   And  . umeclidinium bromide  1 puff Inhalation Daily  . furosemide  40 mg Intravenous Daily  . ipratropium-albuterol  3 mL Inhalation TID  . levothyroxine  25 mcg Oral Daily  . pantoprazole  40 mg Oral Q breakfast  . sodium chloride flush  3 mL Intravenous Q12H  . sodium chloride flush  3 mL Intravenous Q12H   Continuous Infusions: . sodium chloride    . sodium chloride    . sodium chloride     PRN Meds: sodium chloride, sodium chloride, acetaminophen, albuterol, ondansetron (ZOFRAN) IV, sodium chloride flush, sodium chloride flush   Vital Signs    Vitals:   11/26/18 0124 11/26/18 0335 11/26/18 0341 11/26/18 0551  BP: 140/67   (!) 139/94  Pulse: 70   65  Resp:    20  Temp: 98 F (36.7 C)   98.5 F (36.9 C)  TempSrc: Oral   Oral  SpO2: 100%  97% 98%  Weight:    36.4 kg  Height: 5\' 4"  (1.626 m) 5\' 4"  (1.626 m)      Intake/Output Summary (Last 24 hours) at 11/26/2018 0829 Last data filed at 11/25/2018 1800 Gross per 24 hour  Intake 300 ml  Output -  Net 300 ml   Last 3 Weights 11/26/2018 11/16/2018 11/06/2018  Weight (lbs) 80 lb 3.2 oz 86 lb 86 lb  Weight (kg) 36.378 kg 39.009 kg 39.009 kg  Some encounter information is confidential and restricted. Go to Review Flowsheets activity to see all data.      Telemetry    SR - Personally Reviewed  ECG    SR with prolonged QTc - Personally Reviewed  Physical Exam  Pleasant frail older AAF GEN: No acute distress.   Neck: No JVD Cardiac: RRR, soft systolic murmur, no rubs, or gallops.  Respiratory: Clear to auscultation bilaterally.  GI: Soft, nontender, non-distended  MS: No edema; No deformity. Neuro:  Nonfocal  Psych: Normal affect   Labs    High Sensitivity Troponin:   Recent Labs  Lab 11/24/18 1942 11/24/18 2345  TROPONINIHS 17 21*      Chemistry Recent Labs  Lab 11/24/18 1942 11/26/18 0446  NA 135 137  K 3.3* 4.5  CL 100 101  CO2 25 28  GLUCOSE 84 121*  BUN 6* 20  CREATININE 0.98 1.05*  CALCIUM 8.9 8.8*  GFRNONAA 58* 53*  GFRAA >60 >60  ANIONGAP 10 8     Hematology Recent Labs  Lab 11/24/18 1942 11/26/18 0446  WBC 9.0 6.9  RBC 3.83* 3.73*  HGB 10.9* 10.7*  HCT 35.5* 34.0*  MCV 92.7 91.2  MCH 28.5 28.7  MCHC 30.7 31.5  RDW 13.3 13.0  PLT 232 221    BNP Recent Labs  Lab 11/25/18 0932  BNP 318.2*     DDimer No results for input(s): DDIMER in the last 168 hours.   Radiology    Dg Chest 2 View  Result Date: 11/24/2018 CLINICAL DATA:  Chest pressure EXAM: CHEST - 2 VIEW COMPARISON:  Radiograph 02/02/2018, CTA chest 12/30/2017 FINDINGS: Stable  hyperinflation and chronic bronchitic changes. No pneumothorax or effusion. No focal consolidative process. Cardiomediastinal contours are unchanged from priors with a calcified aorta and few surgical clips projecting over the upper mediastinum. No acute osseous or soft tissue abnormality. IMPRESSION: Stable findings of COPD and chronic bronchitis. No acute cardiopulmonary disease. Electronically Signed   By: Lovena Le M.D.   On: 11/24/2018 20:52    Cardiac Studies   TTE: 11/25/18  IMPRESSIONS    1. Left ventricular ejection fraction, by visual estimation, is 45 to 50%. The left ventricle has mildly decreased function. There is no left ventricular hypertrophy.  2. Left ventricular diastolic parameters are consistent with Grade I diastolic dysfunction (impaired relaxation).  3. Global right ventricle has normal systolic function.The right ventricular size is normal. No increase in right ventricular wall thickness.  4. Left  atrial size was moderately dilated.  5. Right atrial size was normal.  6. The mitral valve is normal in structure. Mild to moderate mitral valve regurgitation. No evidence of mitral stenosis.  7. The tricuspid valve is normal in structure. Tricuspid valve regurgitation is mild.  8. The aortic valve is normal in structure. Aortic valve regurgitation is mild. No evidence of aortic valve sclerosis or stenosis.  9. There is Mild calcification of the aortic valve. 10. There is Moderate thickening of the aortic valve. 11. The pulmonic valve was normal in structure. Pulmonic valve regurgitation is not visualized. 12. Mildly elevated pulmonary artery systolic pressure. 13. The inferior vena cava is normal in size with greater than 50% respiratory variability, suggesting right atrial pressure of 3 mmHg.  Patient Profile     73 y.o. female with a history of CAD, ischemic cardiomyopathy, paroxysmal atrial fibrillation, PAD, COPD on O2 as needed at home, hypertension, hyperlipidemia, who was seen for the evaluation of chest pain at the request of Dr. Lorin Mercy.  Assessment & Plan    1. Acute Combine CHF/ Ischemic Cardiomyopathy: presented with worsening shortness of breath with very minimal activity. Chest x-ray showed stable findings of COPD and chronic bronchitis but no acute findings. BNP 318.2. Given lasix in the ED, no recorded output this morning. Does not appear volume overloaded on exam. Will hold lasix given plan for cath today. Echo showed mildly reduced EF of 45-50% - Continue home Coreg 25 mg twice daily.  2. Chest Pain with Known [id number] some left-sided chest pressure some possible radiation/tingling to left arm with worsening of her breathing. She has known multivessel CAD based off cath in 2010.  Medical management was recommended at that time.  Myoview in 2013 showed prior area of infarct and mild to moderate.  The infarct ischemia correlating with known occluded left circumflex.  Dobutamine  echo in 03/2015 showed resting inferior akinesis but no new wall motion abnormalities with stress. High-sensitivity troponin borderline elevated at 17 >> 21. Given her chest pain and worsening shortness of breath, planned for left and right cardiac cath today.   3. Acute on Chronic Respiratory Failure with Hypoxia with Known COPD: Hypoxic to 70's with ambulation while in the ED. COVID negative. - Management per primary team.   4. Paroxysmal Atrial Fibrillation: Maintaining sinus rhythm at this time. - Continue Coreg, Eliquis held for with plans for cath.  5. PAD: Followed by Dr. Gwenlyn Found. -No active claudication  6. Hypertension: BP currently well controlled. - Continue Coreg as above.  7. Hyperlipidemia: Last LDL 88 in 08/2018. - LDL goal <70 given significant CAD and PAD. - Currently on Pravastatin 20mg  daily.  Changed to Lipitor 40mg  daily in favor of high-intensity statin.  8. Hypokalemia: resolved this morning.  9. Hypothyroidism - Continue Synthroid per primary team.  For questions or updates, please contact [place] Please consult [email or web address] for contact info under        Signed, Reino Bellis, NP  11/26/2018, 8:29 AM    ATTENDING ATTESTATION  I have seen, examined and evaluated the patient this AM along with Reino Bellis, NP-C.  After reviewing all the available data and chart, we discussed the patients laboratory, study & physical findings as well as symptoms in detail. I agree with her findings, examination as well as impression recommendations as per our discussion.    Is feeling better today, but still has a mild aching in her chest.  However when she does get up to do anything it becomes worse and more of a tightness.  While this could simply be related to COPD, we do need to exclude coronary etiology.  There may not be anything we do from PCI standpoint, but we at least need to identify if there is a potential culprit lesion.  We will also evaluate for  potential pulmonary hypertension with her longstanding COPD. Plan right left heart cath today.  More plans pending results.  Currently on Eliquis, and convert from pravastatin to Lipitor     Glenetta Hew, M.D., M.S. Interventional Cardiologist   Pager # [phone] Phone # [phone] [address] [place], [place] [address]

## 2018-11-26 NOTE — H&P (View-Only) (Signed)
Progress Note  Patient Name: Tonya Harper Date of Encounter: 11/26/2018  Primary Cardiologist: Kirk Ruths, MD   Subjective   Very mild chest pressure this morning.   Inpatient Medications    Scheduled Meds: . aspirin EC  81 mg Oral Daily  . atorvastatin  40 mg Oral q1800  . carvedilol  25 mg Oral BID WC  . fluticasone furoate-vilanterol  1 puff Inhalation Daily   And  . umeclidinium bromide  1 puff Inhalation Daily  . furosemide  40 mg Intravenous Daily  . ipratropium-albuterol  3 mL Inhalation TID  . levothyroxine  25 mcg Oral Daily  . pantoprazole  40 mg Oral Q breakfast  . sodium chloride flush  3 mL Intravenous Q12H  . sodium chloride flush  3 mL Intravenous Q12H   Continuous Infusions: . sodium chloride    . sodium chloride    . sodium chloride     PRN Meds: sodium chloride, sodium chloride, acetaminophen, albuterol, ondansetron (ZOFRAN) IV, sodium chloride flush, sodium chloride flush   Vital Signs    Vitals:   11/26/18 0124 11/26/18 0335 11/26/18 0341 11/26/18 0551  BP: 140/67   (!) 139/94  Pulse: 70   65  Resp:    20  Temp: 98 F (36.7 C)   98.5 F (36.9 C)  TempSrc: Oral   Oral  SpO2: 100%  97% 98%  Weight:    36.4 kg  Height: 5\' 4"  (1.626 m) 5\' 4"  (1.626 m)      Intake/Output Summary (Last 24 hours) at 11/26/2018 0829 Last data filed at 11/25/2018 1800 Gross per 24 hour  Intake 300 ml  Output -  Net 300 ml   Last 3 Weights 11/26/2018 11/16/2018 11/06/2018  Weight (lbs) 80 lb 3.2 oz 86 lb 86 lb  Weight (kg) 36.378 kg 39.009 kg 39.009 kg  Some encounter information is confidential and restricted. Go to Review Flowsheets activity to see all data.      Telemetry    SR - Personally Reviewed  ECG    SR with prolonged QTc - Personally Reviewed  Physical Exam  Pleasant frail older AAF GEN: No acute distress.   Neck: No JVD Cardiac: RRR, soft systolic murmur, no rubs, or gallops.  Respiratory: Clear to auscultation bilaterally.  GI: Soft, nontender, non-distended  MS: No edema; No deformity. Neuro:  Nonfocal  Psych: Normal affect   Labs    High Sensitivity Troponin:   Recent Labs  Lab 11/24/18 1942 11/24/18 2345  TROPONINIHS 17 21*      Chemistry Recent Labs  Lab 11/24/18 1942 11/26/18 0446  NA 135 137  K 3.3* 4.5  CL 100 101  CO2 25 28  GLUCOSE 84 121*  BUN 6* 20  CREATININE 0.98 1.05*  CALCIUM 8.9 8.8*  GFRNONAA 58* 53*  GFRAA >60 >60  ANIONGAP 10 8     Hematology Recent Labs  Lab 11/24/18 1942 11/26/18 0446  WBC 9.0 6.9  RBC 3.83* 3.73*  HGB 10.9* 10.7*  HCT 35.5* 34.0*  MCV 92.7 91.2  MCH 28.5 28.7  MCHC 30.7 31.5  RDW 13.3 13.0  PLT 232 221    BNP Recent Labs  Lab 11/25/18 0932  BNP 318.2*     DDimer No results for input(s): DDIMER in the last 168 hours.   Radiology    Dg Chest 2 View  Result Date: 11/24/2018 CLINICAL DATA:  Chest pressure EXAM: CHEST - 2 VIEW COMPARISON:  Radiograph 02/02/2018, CTA chest 12/30/2017 FINDINGS: Stable  hyperinflation and chronic bronchitic changes. No pneumothorax or effusion. No focal consolidative process. Cardiomediastinal contours are unchanged from priors with a calcified aorta and few surgical clips projecting over the upper mediastinum. No acute osseous or soft tissue abnormality. IMPRESSION: Stable findings of COPD and chronic bronchitis. No acute cardiopulmonary disease. Electronically Signed   By: Lovena Le M.D.   On: 11/24/2018 20:52    Cardiac Studies   TTE: 11/25/18  IMPRESSIONS    1. Left ventricular ejection fraction, by visual estimation, is 45 to 50%. The left ventricle has mildly decreased function. There is no left ventricular hypertrophy.  2. Left ventricular diastolic parameters are consistent with Grade I diastolic dysfunction (impaired relaxation).  3. Global right ventricle has normal systolic function.The right ventricular size is normal. No increase in right ventricular wall thickness.  4. Left  atrial size was moderately dilated.  5. Right atrial size was normal.  6. The mitral valve is normal in structure. Mild to moderate mitral valve regurgitation. No evidence of mitral stenosis.  7. The tricuspid valve is normal in structure. Tricuspid valve regurgitation is mild.  8. The aortic valve is normal in structure. Aortic valve regurgitation is mild. No evidence of aortic valve sclerosis or stenosis.  9. There is Mild calcification of the aortic valve. 10. There is Moderate thickening of the aortic valve. 11. The pulmonic valve was normal in structure. Pulmonic valve regurgitation is not visualized. 12. Mildly elevated pulmonary artery systolic pressure. 13. The inferior vena cava is normal in size with greater than 50% respiratory variability, suggesting right atrial pressure of 3 mmHg.  Patient Profile     73 y.o. female with a history of CAD, ischemic cardiomyopathy, paroxysmal atrial fibrillation, PAD, COPD on O2 as needed at home, hypertension, hyperlipidemia, who was seen for the evaluation of chest pain at the request of Dr. Lorin Mercy.  Assessment & Plan    1. Acute Combine CHF/ Ischemic Cardiomyopathy: presented with worsening shortness of breath with very minimal activity. Chest x-ray showed stable findings of COPD and chronic bronchitis but no acute findings. BNP 318.2. Given lasix in the ED, no recorded output this morning. Does not appear volume overloaded on exam. Will hold lasix given plan for cath today. Echo showed mildly reduced EF of 45-50% - Continue home Coreg 25 mg twice daily.  2. Chest Pain with Known [id number] some left-sided chest pressure some possible radiation/tingling to left arm with worsening of her breathing. She has known multivessel CAD based off cath in 2010.  Medical management was recommended at that time.  Myoview in 2013 showed prior area of infarct and mild to moderate.  The infarct ischemia correlating with known occluded left circumflex.  Dobutamine  echo in 03/2015 showed resting inferior akinesis but no new wall motion abnormalities with stress. High-sensitivity troponin borderline elevated at 17 >> 21. Given her chest pain and worsening shortness of breath, planned for left and right cardiac cath today.   3. Acute on Chronic Respiratory Failure with Hypoxia with Known COPD: Hypoxic to 70's with ambulation while in the ED. COVID negative. - Management per primary team.   4. Paroxysmal Atrial Fibrillation: Maintaining sinus rhythm at this time. - Continue Coreg, Eliquis held for with plans for cath.  5. PAD: Followed by Dr. Gwenlyn Found. -No active claudication  6. Hypertension: BP currently well controlled. - Continue Coreg as above.  7. Hyperlipidemia: Last LDL 88 in 08/2018. - LDL goal <70 given significant CAD and PAD. - Currently on Pravastatin 20mg  daily.  Changed to Lipitor 40mg  daily in favor of high-intensity statin.  8. Hypokalemia: resolved this morning.  9. Hypothyroidism - Continue Synthroid per primary team.  For questions or updates, please contact [place] Please consult [email or web address] for contact info under        Signed, Reino Bellis, NP  11/26/2018, 8:29 AM    ATTENDING ATTESTATION  I have seen, examined and evaluated the patient this AM along with Reino Bellis, NP-C.  After reviewing all the available data and chart, we discussed the patients laboratory, study & physical findings as well as symptoms in detail. I agree with her findings, examination as well as impression recommendations as per our discussion.    Is feeling better today, but still has a mild aching in her chest.  However when she does get up to do anything it becomes worse and more of a tightness.  While this could simply be related to COPD, we do need to exclude coronary etiology.  There may not be anything we do from PCI standpoint, but we at least need to identify if there is a potential culprit lesion.  We will also evaluate for  potential pulmonary hypertension with her longstanding COPD. Plan right left heart cath today.  More plans pending results.  Currently on Eliquis, and convert from pravastatin to Lipitor     Glenetta Hew, M.D., M.S. Interventional Cardiologist   Pager # [phone] Phone # [phone] [address] [place], [place] [address]

## 2018-11-26 NOTE — Progress Notes (Signed)
PROGRESS NOTE    Tonya Harper   [id number] DOB: [birth year] DOA: 11/24/2018  Admitted from: Home, lives with significant other  PCP: Lanice Shirts, MD   Hospital Summary  Tonya Harper is a 73 y.o. female with medical history significant of PAD; CAD; HTN; HLD; chronic systolic CHF, recently started on Imdur; and COPD previously on as needed [place] O2 presenting with SOB, chest pressure and LE edema, orthopnea.  Symptoms started Friday, but they worsened Sunday requiring continuous home O2 use.  In the ED patient was hypoxic to 70s with ambulation.  Covid negative.  She was admitted for COPD vs. CHF exacerbation.  Cardiology consulted and patient was diuresed, continued on home Coreg, had 2D echo (45 to [id number], grade 1 diastolic dysfunction) and she underwent right/left heart cath on 10/29 which showed normal right heart pressures and no target for PCI, recommended to continue medical therapy.  A & P   Principal Problem:   Acute on chronic respiratory failure with hypoxia (HCC) Active Problems:   Coronary artery disease involving native coronary artery of native heart with angina pectoris (HCC)   Chronic diastolic heart failure (HCC)   Progressive angina (HCC)   Hypertension   Chronic obstructive pulmonary disease (HCC)   Hypothyroidism (acquired)   Atypical angina (HCC)   Acute combined CHF/ischemic cardiomyopathy presented with dyspnea on minimal exertion with out acute findings on chest x-ray, mildly elevated BNP (318).  Received Lasix in ED, without recorded output today.  Lasix held by cardio.  EF 45 to [id number] with diastolic dysfunction. . Continue home Coreg 25 mg twice daily  Chest pain with known CAD left-sided chest pressure with radiation/tingling to left arm and worsening on breathing with known multivessel CAD.  Right and left heart cath today, most notably: Mid LAD lesion 25% stenosed, mid circumflex 100% stenosed with collaterals, RCA lesion 100% stenosed with  collaterals, distal LM 25% stenosed,Ao sat 99%, PA sat 76%, mean PA pressure 24 mm Hg; mean PCWP 7 mm Hg; CO 5.25 L/min; CI 3.97. Marland Kitchen Continue medical therapy on aspirin and Lipitor 40 mg as well as Coreg  Acute on chronic respiratory failure with hypoxia in setting of known COPD hypoxic on ambulation in ED, Covid negative..  Currently SPO2 96% on 2 L nasal cannula . Continue DuoNeb standing and albuterol neb as needed . Continue Trelegy . Continue off steroids for now unless patient has worsening respiratory status  Paroxysmal atrial fibrillation Eliquis held for cath today . Continue Coreg  Hypertension stable . Continue Coreg  Hyperlipidemia LDL 88 in 08/2018 . Goal LDL less than 70 . Pravastatin changed to Lipitor 40 mg-continue at discharge  Hypokalemia resolved  Hypothyroidism continue Synthroid  DVT prophylaxis: SCD   Code Status: Full Code  Diet: Heart healthy Family Communication: Patient's son has been updated over the phone  Disposition Plan: Pending clinical stability.  Consultants  . Cardiology  Procedures  . Right and left heart cath 10/29     Subjective   Patient seen and examined at bedside no acute distress and resting comfortably.  No events overnight.  N.p.o. this morning prior to cath  Admits to shortness of breath leading to presentation, somewhat improved this morning, without any chest pressure currently.  Denied fever, nausea, vomiting, urinary complaints.  Otherwise ROS negative   Objective   Vitals:   11/26/18 1517 11/26/18 1532 11/26/18 1547 11/26/18 1617  BP: 110/63 (!) 113/57 (!) 101/59 (!) 112/59  Pulse: (!) 57     Resp: 14  14 13 14   Temp: (!) 97.4 F (36.3 C)     TempSrc: Oral     SpO2: 99% 100% 99% 100%  Weight:      Height:        Intake/Output Summary (Last 24 hours) at 11/26/2018 1721 Last data filed at 11/26/2018 1033 Gross per 24 hour  Intake 300 ml  Output 225 ml  Net 75 ml   Filed Weights   11/26/18 0551  Weight:  36.4 kg    Examination:  Physical Exam Vitals signs and nursing note reviewed.  Constitutional:      General: She is not in acute distress.    Comments: Frail elderly female  HENT:     Head: Normocephalic and atraumatic.     Nose: Nose normal.     Mouth/Throat:     Mouth: Mucous membranes are moist.  Eyes:     Extraocular Movements: Extraocular movements intact.  Neck:     Musculoskeletal: Normal range of motion. No neck rigidity.  Cardiovascular:     Rate and Rhythm: Normal rate.     Heart sounds: No murmur.  Pulmonary:     Effort: Pulmonary effort is normal. No respiratory distress.     Comments: Diffusely decreased breath sounds Abdominal:     General: Abdomen is flat.     Palpations: Abdomen is soft.  Musculoskeletal: Normal range of motion.        General: No swelling.  Neurological:     General: No focal deficit present.     Mental Status: She is alert. Mental status is at baseline.  Psychiatric:        Mood and Affect: Mood normal.        Behavior: Behavior normal.     Data Reviewed: I have personally reviewed following labs and imaging studies  CBC: Recent Labs  Lab 11/24/18 1942 11/26/18 0446 11/26/18 1431 11/26/18 1438  WBC 9.0 6.9  --   --   NEUTROABS  --  4.8  --   --   HGB 10.9* 10.7* 10.5* 10.5*  HCT 35.5* 34.0* 31.0* 31.0*  MCV 92.7 91.2  --   --   PLT 232 221  --   --    Basic Metabolic Panel: Recent Labs  Lab 11/24/18 1942 11/26/18 0446 11/26/18 1431 11/26/18 1438  NA 135 137 140 141  K 3.3* 4.5 4.3 4.3  CL 100 101  --   --   CO2 25 28  --   --   GLUCOSE 84 121*  --   --   BUN 6* 20  --   --   CREATININE 0.98 1.05*  --   --   CALCIUM 8.9 8.8*  --   --    GFR: Estimated Creatinine Clearance: 28.2 mL/min (A) (by C-G formula based on SCr of 1.05 mg/dL (H)). Liver Function Tests: No results for input(s): AST, ALT, ALKPHOS, BILITOT, PROT, ALBUMIN in the last 168 hours. No results for input(s): LIPASE, AMYLASE in the last 168  hours. No results for input(s): AMMONIA in the last 168 hours. Coagulation Profile: No results for input(s): INR, PROTIME in the last 168 hours. Cardiac Enzymes: No results for input(s): CKTOTAL, CKMB, CKMBINDEX, TROPONINI in the last 168 hours. BNP (last 3 results) No results for input(s): PROBNP in the last 8760 hours. HbA1C: No results for input(s): HGBA1C in the last 72 hours. CBG: No results for input(s): GLUCAP in the last 168 hours. Lipid Profile: No results for input(s): CHOL, HDL,  LDLCALC, TRIG, CHOLHDL, LDLDIRECT in the last 72 hours. Thyroid Function Tests: No results for input(s): TSH, T4TOTAL, FREET4, T3FREE, THYROIDAB in the last 72 hours. Anemia Panel: No results for input(s): VITAMINB12, FOLATE, FERRITIN, TIBC, IRON, RETICCTPCT in the last 72 hours. Sepsis Labs: No results for input(s): PROCALCITON, LATICACIDVEN in the last 168 hours.  Recent Results (from the past 240 hour(s))  SARS CORONAVIRUS 2 (TAT 6-24 HRS) Nasopharyngeal Nasopharyngeal Swab     Status: None   Collection Time: 11/25/18  9:48 AM   Specimen: Nasopharyngeal Swab  Result Value Ref Range Status   SARS Coronavirus 2 NEGATIVE NEGATIVE Final    Comment: (NOTE) SARS-CoV-2 target nucleic acids are NOT DETECTED. The SARS-CoV-2 RNA is generally detectable in upper and lower respiratory specimens during the acute phase of infection. Negative results do not preclude SARS-CoV-2 infection, do not rule out co-infections with other pathogens, and should not be used as the sole basis for treatment or other patient management decisions. Negative results must be combined with clinical observations, patient history, and epidemiological information. The expected result is Negative. Fact Sheet for Patients: SugarRoll.be Fact Sheet for Healthcare Providers: https://www.woods-mathews.com/ This test is not yet approved or cleared by the [place] FDA and  has been  authorized for detection and/or diagnosis of SARS-CoV-2 by FDA under an Emergency Use Authorization (EUA). This EUA will remain  in effect (meaning this test can be used) for the duration of the COVID-19 declaration under Section 56 4(b)(1) of the Act, 21 U.S.C. section 360bbb-3(b)(1), unless the authorization is terminated or revoked sooner. Performed at [facility], [place] [address] [place], [place] [address]          Radiology Studies: Dg Chest 2 View  Result Date: 11/24/2018 CLINICAL DATA:  Chest pressure EXAM: CHEST - 2 VIEW COMPARISON:  Radiograph 02/02/2018, CTA chest 12/30/2017 FINDINGS: Stable hyperinflation and chronic bronchitic changes. No pneumothorax or effusion. No focal consolidative process. Cardiomediastinal contours are unchanged from priors with a calcified aorta and few surgical clips projecting over the upper mediastinum. No acute osseous or soft tissue abnormality. IMPRESSION: Stable findings of COPD and chronic bronchitis. No acute cardiopulmonary disease. Electronically Signed   By: Lovena Le M.D.   On: 11/24/2018 20:52   Vas [place] Lower Extremity Venous (dvt) ([facility] And [facility] 7a-7p)  Result Date: 11/26/2018  Lower Venous Study Indications: Swelling.  Comparison Study: No prior study Performing Technologist: Maudry Mayhew MHA, RDMS, RVT, RDCS  Examination Guidelines: A complete evaluation includes B-mode imaging, spectral Doppler, color Doppler, and power Doppler as needed of all accessible portions of each vessel. Bilateral testing is considered an integral part of a complete examination. Limited examinations for reoccurring indications may be performed as noted.  +---------+---------------+---------+-----------+----------+--------------+ LEFT     CompressibilityPhasicitySpontaneityPropertiesThrombus Aging +---------+---------------+---------+-----------+----------+--------------+ CFV      Full           Yes      Yes                                  +---------+---------------+---------+-----------+----------+--------------+ SFJ      Full                                                        +---------+---------------+---------+-----------+----------+--------------+ FV Prox  Full                                                        +---------+---------------+---------+-----------+----------+--------------+ FV Mid   Full                                                        +---------+---------------+---------+-----------+----------+--------------+ FV DistalFull                                                        +---------+---------------+---------+-----------+----------+--------------+ PFV      Full                                                        +---------+---------------+---------+-----------+----------+--------------+ POP      Full           Yes      Yes                                 +---------+---------------+---------+-----------+----------+--------------+ PTV      Full                                                        +---------+---------------+---------+-----------+----------+--------------+ PERO     Full                                                        +---------+---------------+---------+-----------+----------+--------------+     Summary: Left: There is no evidence of deep vein thrombosis in the lower extremity. No cystic structure found in the popliteal fossa.  *See table(s) above for measurements and observations. Electronically signed by Servando Snare MD on 11/26/2018 at 4:26:48 PM.    Final         Scheduled Meds: . [START ON 11/27/2018] aspirin EC  81 mg Oral Daily  . atorvastatin  40 mg Oral q1800  . carvedilol  25 mg Oral BID WC  . fluticasone furoate-vilanterol  1 puff Inhalation Daily   And  . umeclidinium bromide  1 puff Inhalation Daily  . ipratropium-albuterol  3 mL Inhalation TID  . levothyroxine  25 mcg Oral Daily  . pantoprazole  40 mg  Oral Q breakfast  . sodium chloride flush  3 mL Intravenous Q12H   Continuous Infusions: . sodium chloride 50 mL/hr at 11/26/18 1525  . sodium chloride       LOS: 1 day    Time spent: 30 minutes  Harold Hedge, DO [facility] Pager [phone]  If 7PM-7AM, please contact night-coverage [email or web address] Password [email or web address] 11/26/2018, 5:21 PM

## 2018-11-26 NOTE — Progress Notes (Signed)
Left lower extremity venous duplex completed. Refer to "CV Proc" under chart review to view preliminary results.  11/26/2018 12:17 PM Maudry Mayhew, MHA, RVT, RDCS, RDMS

## 2018-11-26 NOTE — Progress Notes (Signed)
PT Cancellation Note  Patient Details Name: Cresta Nell MRN: [id number] DOB: 1945/09/20   Cancelled Treatment:    Reason Eval/Treat Not Completed: Patient not medically ready. Pt continues to have chest tightness with activity and is to have cardiac cath today.   Shary Decamp [facility] 11/26/2018, 11:32 AM [place] Pager [phone] Office [phone]

## 2018-11-27 ENCOUNTER — Telehealth: Payer: Self-pay | Admitting: Cardiology

## 2018-11-27 ENCOUNTER — Encounter (HOSPITAL_COMMUNITY): Payer: Self-pay | Admitting: Interventional Cardiology

## 2018-11-27 DIAGNOSIS — J9621 Acute and chronic respiratory failure with hypoxia: Secondary | ICD-10-CM | POA: Diagnosis not present

## 2018-11-27 DIAGNOSIS — I5032 Chronic diastolic (congestive) heart failure: Secondary | ICD-10-CM | POA: Diagnosis not present

## 2018-11-27 DIAGNOSIS — J42 Unspecified chronic bronchitis: Secondary | ICD-10-CM | POA: Diagnosis not present

## 2018-11-27 DIAGNOSIS — I208 Other forms of angina pectoris: Secondary | ICD-10-CM | POA: Diagnosis not present

## 2018-11-27 LAB — GLUCOSE, CAPILLARY: Glucose-Capillary: 92 mg/dL (ref 70–99)

## 2018-11-27 LAB — BASIC METABOLIC PANEL
Anion gap: 7 (ref 5–15)
BUN: 21 mg/dL (ref 8–23)
CO2: 29 mmol/L (ref 22–32)
Calcium: 8.9 mg/dL (ref 8.9–10.3)
Chloride: 103 mmol/L (ref 98–111)
Creatinine, Ser: 1.1 mg/dL — ABNORMAL HIGH (ref 0.44–1.00)
GFR calc Af Amer: 58 mL/min — ABNORMAL LOW (ref >60)
GFR calc non Af Amer: 50 mL/min — ABNORMAL LOW (ref >60)
Glucose, Bld: 79 mg/dL (ref 70–99)
Potassium: 4.3 mmol/L (ref 3.5–5.1)
Sodium: 139 mmol/L (ref 135–145)

## 2018-11-27 MED ORDER — APIXABAN 5 MG PO TABS
5.0000 mg | ORAL_TABLET | Freq: Two times a day (BID) | ORAL | Status: DC
  Administered 2018-11-27: 5 mg via ORAL
  Filled 2018-11-27: qty 1

## 2018-11-27 MED ORDER — RANOLAZINE ER 500 MG PO TB12
500.0000 mg | ORAL_TABLET | Freq: Two times a day (BID) | ORAL | Status: DC
  Administered 2018-11-27: 500 mg via ORAL
  Filled 2018-11-27: qty 1

## 2018-11-27 MED ORDER — ASPIRIN 81 MG PO TBEC: 81.0000 mg | DELAYED_RELEASE_TABLET | Freq: Every day | ORAL | 1 refills | Status: DC

## 2018-11-27 MED ORDER — RANOLAZINE ER 500 MG PO TB12: 500.0000 mg | ORAL_TABLET | Freq: Two times a day (BID) | ORAL | 1 refills | Status: DC

## 2018-11-27 MED ORDER — ATORVASTATIN CALCIUM 40 MG PO TABS: 40.0000 mg | ORAL_TABLET | Freq: Every day | ORAL | 0 refills | Status: DC

## 2018-11-27 NOTE — Discharge Summary (Signed)
Physician Discharge Summary  Tonya Harper [id number] DOB: [birth year] DOA: 11/24/2018  PCP: Lanice Shirts, MD  Admit date: 11/24/2018 Discharge date: 11/27/2018  Admitted From: Home Discharged to: [place]: No Equipment/Devices: No Discharge Condition: Stable  Recommendations for Outpatient Follow-up    1. Follow up with PCP in 1-2 weeks 2. Please follow up BMP/CBC   Medication Adjustments at Discharge  1. Ranolazine 500 mg twice daily 2. Aspirin 81 mg, hold Plavix while patient is also on Eliquis 3. Pravastatin switched to atorvastatin   Hospital Summary  Tonya Harper a 73 y.o.femalewith medical history significant ofPAD; CAD; HTN; HLD; chronic systolic CHF, recently started on Imdur; and COPD previously on as needed [place] O2 presenting with SOB, chest pressure and LE edema, orthopnea. Symptoms started Friday, but they worsened Sunday requiring continuous home O2 use.  In the ED patient was hypoxic to 70s with ambulation.  Covid negative.  She was admitted for COPD with CHF exacerbation.  Cardiology consulted and patient was diuresed, continued on home Coreg, had 2D echo (45 to [id number], grade 1 diastolic dysfunction) and she underwent right/left heart cath on 10/29 which showed normal right heart pressures and no target for PCI, recommended to continue medical therapy.  A & P   Principal Problem:   Acute on chronic respiratory failure with hypoxia (HCC) Active Problems:   Coronary artery disease involving native coronary artery of native heart with angina pectoris (HCC)   Chronic diastolic heart failure (HCC)   Progressive angina (HCC)   Hypertension   Chronic obstructive pulmonary disease (HCC)   Hypothyroidism (acquired)   Atypical angina (HCC)   Acute combined CHF/ischemic cardiomyopathy   Continue home Coreg 25 mg twice daily  May need to be switched to bisoprolol  Chest pain with known CAD  status post right and left heart cath 10/29 without  any intervention (see cath report for further details)  Continue medical therapy on aspirin   Pravastatin switch to Lipitor 40 mg   Continue Coreg  Started on ranolazine this admission  Ambulated with PT with no recommendations  Acute on chronic respiratory failure with hypoxia in setting of known COPD hypoxic on ambulation in ED, Covid negative.  Improved with diuresis and nebulizers.  Did not receive steroid treatment or antibiotics.  Patient desatted to 88% on room air with ambulation requiring 2 L.  Has as needed home O2  Use 2 L O2 nasal cannula while ambulating until patient follows up with her PCP  Continue Trelegy and home meds  Paroxysmal atrial fibrillation   Continue Coreg  Continue Eliquis  Hypertension stable  Continue Coreg  Hyperlipidemia LDL 88 in 08/2018  Goal LDL less than 70  Pravastatin changed to Lipitor 40 mg-continue at discharge  Hypokalemia resolved  Hypothyroidism continue Synthroid   Code Status: Full Code Diet recommendation: Heart healthy  Consultants  . Cardiology  Procedures  . RHC/LHC 10/29   Subjective  Patient seen and examined at bedside no acute distress and resting comfortably.  No events overnight.  Tolerating diet. In good spirits and anticipating discharge.    Denies any chest pain, shortness of breath, fever, nausea, vomiting, urinary or bowel complaints. Otherwise ROS negative   Objective   Discharge Exam: Vitals:   11/27/18 0807 11/27/18 0934  BP: 127/66   Pulse:  77  Resp: 18   Temp:    SpO2: 98%    Vitals:   11/27/18 0420 11/27/18 0425 11/27/18 0807 11/27/18 0934  BP: (!) 122/59  127/66  Pulse: 62   77  Resp: 17  18   Temp: 98.1 F (36.7 C)     TempSrc: Oral     SpO2: 99%  98%   Weight:  37.7 kg    Height:        Physical Exam Vitals signs and nursing note reviewed.  Constitutional:      Appearance: She is not ill-appearing.     Comments: Frail elderly female  HENT:     Head:  Normocephalic and atraumatic.     Mouth/Throat:     Mouth: Mucous membranes are moist.  Eyes:     Extraocular Movements: Extraocular movements intact.  Neck:     Musculoskeletal: Normal range of motion.  Cardiovascular:     Rate and Rhythm: Normal rate and regular rhythm.  Pulmonary:     Effort: Pulmonary effort is normal.     Comments: Diffusely decreased breath sounds Abdominal:     General: Abdomen is flat.     Palpations: Abdomen is soft.  Musculoskeletal: Normal range of motion.        General: No swelling.  Skin:    General: Skin is warm.     Coloration: Skin is not pale.  Neurological:     Mental Status: She is alert. Mental status is at baseline.  Psychiatric:        Mood and Affect: Mood normal.        Behavior: Behavior normal.        Thought Content: Thought content normal.       The results of significant diagnostics from this hospitalization (including imaging, microbiology, ancillary and laboratory) are listed below for reference.     Microbiology: Recent Results (from the past 240 hour(s))  SARS CORONAVIRUS 2 (TAT 6-24 HRS) Nasopharyngeal Nasopharyngeal Swab     Status: None   Collection Time: 11/25/18  9:48 AM   Specimen: Nasopharyngeal Swab  Result Value Ref Range Status   SARS Coronavirus 2 NEGATIVE NEGATIVE Final    Comment: (NOTE) SARS-CoV-2 target nucleic acids are NOT DETECTED. The SARS-CoV-2 RNA is generally detectable in upper and lower respiratory specimens during the acute phase of infection. Negative results do not preclude SARS-CoV-2 infection, do not rule out co-infections with other pathogens, and should not be used as the sole basis for treatment or other patient management decisions. Negative results must be combined with clinical observations, patient history, and epidemiological information. The expected result is Negative. Fact Sheet for Patients: SugarRoll.be Fact Sheet for Healthcare  Providers: https://www.woods-mathews.com/ This test is not yet approved or cleared by the [place] FDA and  has been authorized for detection and/or diagnosis of SARS-CoV-2 by FDA under an Emergency Use Authorization (EUA). This EUA will remain  in effect (meaning this test can be used) for the duration of the COVID-19 declaration under Section 56 4(b)(1) of the Act, 21 U.S.C. section 360bbb-3(b)(1), unless the authorization is terminated or revoked sooner. Performed at [facility], [place] [address] [place], [place] [address]      Labs: BNP (last 3 results) Recent Labs    12/30/17 1223 11/25/18 0932  BNP 86.8 123456*   Basic Metabolic Panel: Recent Labs  Lab 11/24/18 1942 11/26/18 0446 11/26/18 1431 11/26/18 1438 11/27/18 0407  NA 135 137 140 141 139  K 3.3* 4.5 4.3 4.3 4.3  CL 100 101  --   --  103  CO2 25 28  --   --  29  GLUCOSE 84 121*  --   --  79  BUN 6* 20  --   --  21  CREATININE 0.98 1.05*  --   --  1.10*  CALCIUM 8.9 8.8*  --   --  8.9   Liver Function Tests: No results for input(s): AST, ALT, ALKPHOS, BILITOT, PROT, ALBUMIN in the last 168 hours. No results for input(s): LIPASE, AMYLASE in the last 168 hours. No results for input(s): AMMONIA in the last 168 hours. CBC: Recent Labs  Lab 11/24/18 1942 11/26/18 0446 11/26/18 1431 11/26/18 1438  WBC 9.0 6.9  --   --   NEUTROABS  --  4.8  --   --   HGB 10.9* 10.7* 10.5* 10.5*  HCT 35.5* 34.0* 31.0* 31.0*  MCV 92.7 91.2  --   --   PLT 232 221  --   --    Cardiac Enzymes: No results for input(s): CKTOTAL, CKMB, CKMBINDEX, TROPONINI in the last 168 hours. BNP: Invalid input(s): POCBNP CBG: Recent Labs  Lab 11/27/18 1147  GLUCAP 92   D-Dimer No results for input(s): DDIMER in the last 72 hours. Hgb A1c No results for input(s): HGBA1C in the last 72 hours. Lipid Profile No results for input(s): CHOL, HDL, LDLCALC, TRIG, CHOLHDL, LDLDIRECT in the last 72 hours. Thyroid  function studies No results for input(s): TSH, T4TOTAL, T3FREE, THYROIDAB in the last 72 hours.  Invalid input(s): FREET3 Anemia work up No results for input(s): VITAMINB12, FOLATE, FERRITIN, TIBC, IRON, RETICCTPCT in the last 72 hours. Urinalysis    Component Value Date/Time   BILIRUBINUR neg 10/21/2012 1022   PROTEINUR neg 10/21/2012 1022   UROBILINOGEN negative 10/21/2012 1022   NITRITE neg 10/21/2012 1022   LEUKOCYTESUR Negative 10/21/2012 1022   Sepsis Labs Invalid input(s): PROCALCITONIN,  WBC,  LACTICIDVEN Microbiology Recent Results (from the past 240 hour(s))  SARS CORONAVIRUS 2 (TAT 6-24 HRS) Nasopharyngeal Nasopharyngeal Swab     Status: None   Collection Time: 11/25/18  9:48 AM   Specimen: Nasopharyngeal Swab  Result Value Ref Range Status   SARS Coronavirus 2 NEGATIVE NEGATIVE Final    Comment: (NOTE) SARS-CoV-2 target nucleic acids are NOT DETECTED. The SARS-CoV-2 RNA is generally detectable in upper and lower respiratory specimens during the acute phase of infection. Negative results do not preclude SARS-CoV-2 infection, do not rule out co-infections with other pathogens, and should not be used as the sole basis for treatment or other patient management decisions. Negative results must be combined with clinical observations, patient history, and epidemiological information. The expected result is Negative. Fact Sheet for Patients: SugarRoll.be Fact Sheet for Healthcare Providers: https://www.woods-mathews.com/ This test is not yet approved or cleared by the [place] FDA and  has been authorized for detection and/or diagnosis of SARS-CoV-2 by FDA under an Emergency Use Authorization (EUA). This EUA will remain  in effect (meaning this test can be used) for the duration of the COVID-19 declaration under Section 56 4(b)(1) of the Act, 21 U.S.C. section 360bbb-3(b)(1), unless the authorization is terminated  or revoked sooner. Performed at [facility], [place] [address] [place], [place] [address]     Discharge Instructions     Discharge Instructions    Diet - low sodium heart healthy   Complete by: As directed    Discharge instructions   Complete by: As directed    You were seen in the hospital for evaluation of your shortness of breath and found to be heart failure added to COPD exacerbation.  You underwent a heart catheterization without any  remarkable findings.  You have been started on ranolazine to be taken by mouth 2 times daily for chest pain.  Also, your pravastatin is being switched to atorvastatin 40 mg to be taken once daily.  Get lab work in 1 week and follow-up with your PCP Follow-up with cardiology Use 1 to 2 L of oxygen while walking throughout the day until you follow-up with your PCP  If you have any significant change or worsening of your symptoms please not hesitate to contact your primary care physician or return to the ED   Heart Failure patients record your daily weight using the same scale at the same time of day   Complete by: As directed    Increase activity slowly   Complete by: As directed    STOP any activity that causes chest pain, shortness of breath, dizziness, sweating, or exessive weakness   Complete by: As directed      Allergies as of 11/27/2018      Reactions   Aspirin Swelling   nausea and dizziness after taking for one month at a time.. Tolerates the 81 mg    Pneumovax [pneumococcal Polysaccharide Vaccine] Hives, Swelling   Shellfish Allergy Anaphylaxis, Swelling   Medication withdrawal symptoms   Ibuprofen Nausea And Vomiting   dizziness   Tdap [tetanus-diphth-acell Pertussis] Swelling, Rash      Medication List    STOP taking these medications   pravastatin 20 MG tablet Commonly known as: PRAVACHOL   predniSONE 10 MG tablet Commonly known as: DELTASONE     TAKE these medications   acetaminophen 325 MG tablet Commonly  known as: TYLENOL Take 650 mg by mouth every 6 (six) hours as needed for mild pain or moderate pain.   albuterol 108 (90 Base) MCG/ACT inhaler Commonly known as: VENTOLIN HFA Inhale 2 puffs into the lungs every 6 (six) hours as needed for wheezing or shortness of breath.   albuterol (2.5 MG/3ML) 0.083% nebulizer solution Commonly known as: PROVENTIL Take 3 mLs (2.5 mg total) by nebulization every 6 (six) hours as needed for wheezing or shortness of breath.   apixaban 5 MG Tabs tablet Commonly known as: ELIQUIS Take 1 tablet (5 mg total) by mouth 2 (two) times daily.   aspirin 81 MG EC tablet Take 1 tablet (81 mg total) by mouth daily. Start taking on: November 28, 2018   atorvastatin 40 MG tablet Commonly known as: LIPITOR Take 1 tablet (40 mg total) by mouth daily at 6 PM.   calcium gluconate 500 MG tablet Take 500 mg by mouth 2 (two) times daily.   carvedilol 25 MG tablet Commonly known as: COREG Take 1 tablet (25 mg total) by mouth 2 (two) times daily with a meal.   cholecalciferol 25 MCG (1000 UT) tablet Commonly known as: VITAMIN D3 Take 2,000 Units by mouth every evening.   clotrimazole 1 % cream Commonly known as: LOTRIMIN Apply 1 application topically 4 (four) times daily as needed. What changed: reasons to take this   furosemide 20 MG tablet Commonly known as: Lasix Take 1 tablet daily as needed for increased ankle swelling   lansoprazole 30 MG capsule Commonly known as: PREVACID Take 30 mg by mouth 2 (two) times daily before a meal.   levothyroxine 25 MCG tablet Commonly known as: SYNTHROID Take 25 mcg by mouth daily.   nitroGLYCERIN 0.4 MG SL tablet Commonly known as: NITROSTAT Place 1 tablet (0.4 mg total) under the tongue every 5 (five) minutes as needed for chest  pain (X3 DOSES BEFORE CALLING 911).   ranolazine 500 MG 12 hr tablet Commonly known as: RANEXA Take 1 tablet (500 mg total) by mouth 2 (two) times daily.   Trelegy Ellipta 100-62.5-25  MCG/INH Aepb Generic drug: Fluticasone-Umeclidin-Vilant Inhale 1 puff into the lungs daily.   UNABLE TO FIND Inhale into the lungs daily as needed (shortness of breath). (Oxygen) 2-3 liters      Follow-up Information    Erlene Quan, PA-C Follow up on 12/04/2018.   Specialties: Cardiology, Radiology Why: 8:30 am for [facility] Contact information: [address] [place] [address] [phone]          Allergies  Allergen Reactions  . Aspirin Swelling    nausea and dizziness after taking for one month at a time.. Tolerates the 81 mg   . Pneumovax [Pneumococcal Polysaccharide Vaccine] Hives and Swelling  . Shellfish Allergy Anaphylaxis and Swelling    Medication withdrawal symptoms    . Ibuprofen Nausea And Vomiting    dizziness  . Tdap [Tetanus-Diphth-Acell Pertussis] Swelling and Rash    Time coordinating discharge: Over 30 minutes   SIGNED:   Harold Hedge, D.O. [facility] 11/27/2018, 1:33 PM

## 2018-11-27 NOTE — Telephone Encounter (Signed)
TOC appt with Kerin Ransom on 12/04/18 at 8:30am per staff message.

## 2018-11-27 NOTE — Care Management Important Message (Signed)
Important Message  Patient Details  Name: Tonya Harper MRN: [id number] Date of Birth: 10-Jun-1945   Medicare Important Message Given:  Yes     Shelda Altes 11/27/2018, 1:04 PM

## 2018-11-27 NOTE — Progress Notes (Signed)
OT Cancellation Note  Patient Details Name: Tonya Harper MRN: [id number] DOB: 07-22-1945   Cancelled Treatment:    Reason Eval/Treat Not Completed: OT screened, no needs identified, will sign off. Per conversation with PT and pt, pt appears to be at/close to baseline with ADLs.   Tyrone Schimke, OT Acute Rehabilitation Services Pager: [phone] Office: [phone]  11/27/2018, 11:26 AM

## 2018-11-27 NOTE — TOC Initial Note (Signed)
Transition of Care [facility]) - Initial/Assessment Note    Patient Details  Name: Tonya Harper MRN: [id number] Date of Birth: 10/30/1945  Transition of Care [facility]) CM/SW Contact:    Bethena Roys, RN Phone Number: 11/27/2018, 12:57 PM  Clinical Narrative:  Pt presented for Acute on Chronic Respiratory Failure. CM received Heart Failure and COPD consults. CM spoke with patient regarding home needs and if patient needed [place]. Patient declined [place] at this time. [facility]- has PCP and she is adherent with appointments. Pt has support of significant other and son. DME in the home is Rollator and riser for commode. No Home health and DME needs at this time. No further needs from CM at this time.                Expected Discharge Plan: Home/Self Care Barriers to Discharge: No Barriers Identified   Patient Goals and CMS Choice Patient states their goals for this hospitalization and ongoing recovery are:: "to return home"   Choice offered to / list presented to : NA  Expected Discharge Plan and Services Expected Discharge Plan: Home/Self Care In-house Referral: NA Discharge Planning Services: CM Consult Post Acute Care Choice: NA Living arrangements for the past 2 months: Single Family Home                   HH Arranged: Refused HH  Prior Living Arrangements/Services Living arrangements for the past 2 months: Single Family Home Lives with:: Significant Other Patient language and need for interpreter reviewed:: Yes Do you feel safe going back to the place where you live?: Yes      Need for Family Participation in Patient Care: Yes (Comment) Care giver support system in place?: Yes (comment) Current home services: DME(Pt has DME Rollator and riser for commode) Criminal Activity/Legal Involvement Pertinent to Current Situation/Hospitalization: No - Comment as needed  Activities of Daily Living Home Assistive  Devices/Equipment: None ADL Screening (condition at time of admission) Patient's cognitive ability adequate to safely complete daily activities?: Yes Is the patient deaf or have difficulty hearing?: No Does the patient have difficulty seeing, even when wearing glasses/contacts?: No Does the patient have difficulty concentrating, remembering, or making decisions?: No Patient able to express need for assistance with ADLs?: No Does the patient have difficulty dressing or bathing?: No Independently performs ADLs?: Yes (appropriate for developmental age) Does the patient have difficulty walking or climbing stairs?: No Weakness of Legs: None Weakness of Arms/Hands: None  Permission Sought/Granted Permission sought to share information with : Family Supports                Emotional Assessment Appearance:: Appears stated age Attitude/Demeanor/Rapport: Engaged Affect (typically observed): Accepting Orientation: : Oriented to Situation, Oriented to  Time, Oriented to Place, Oriented to Self Alcohol / Substance Use: Not Applicable Psych Involvement: No (comment)  Admission diagnosis:  COPD exacerbation ([place]) [J44.1] Acute on chronic congestive heart failure, unspecified heart failure type [facility]) [I50.9] Patient Active Problem List   Diagnosis Date Noted  . Atypical angina ([place]) 11/26/2018  . Acute on chronic respiratory failure with hypoxia ([place]) 11/25/2018  . Hypothyroidism (acquired) 11/25/2018  . Claudication in peripheral vascular disease ([place]) 04/13/2018  . COPD with acute exacerbation ([place]) 12/30/2017  . Renal failure 02/28/2016  . Peripheral arterial disease ([place]) 01/31/2016  . Esophageal candidiasis ([place]) 06/15/2015  . Leukocytosis 04/19/2015  . Pyuria 04/19/2015  . UTI (urinary tract infection) 04/19/2015  .  AAA (abdominal aortic aneurysm) without rupture ([place]) 11/23/2014  . Midline low back pain without sciatica 10/21/2014  . Chest discomfort 10/13/2014  . Elevated  rheumatoid factor 12/01/2012  . History of renal calculi 10/21/2012  . Chronic respiratory failure ([place]) 10/01/2012  . Protein-calorie malnutrition, severe ([place]) 05/28/2012  . Severe protein-calorie malnutrition Altamease O[place]: less than 60% of standard weight) ([place]) 05/28/2012  . Hypertension 06/26/2011  . Hypersensitivity reaction 06/18/2011  . Thyroid nodule, cold 06/11/2011  . Thyroid nodule 06/11/2011  . Stricture of esophagus 05/23/2011  . Injury of esophagus 04/22/2011  . Weight loss 04/09/2011  . Internal hemorrhoids 02/27/2011  . History of colonic polyps 02/27/2011  . Bruit 11/28/2010  . Progressive angina ([place]) 03/28/2010  . COPD exacerbation ([place]) 09/20/2009  . Chronic obstructive pulmonary disease ([place]) 09/20/2009  . Chronic diastolic heart failure ([place]) 05/25/2009  . Pure hypercholesterolemia 11/02/2008  . Anemia 11/02/2008  . TOBACCO ABUSE 11/02/2008  . Tobacco dependence syndrome 11/02/2008  . Hypokalemia 10/28/2008  . Coronary artery disease involving native coronary artery of native heart with angina pectoris ([place]) 10/28/2008  . Cardiomyopathy ([place]) 10/28/2008   PCP:  Lanice Shirts, MD Pharmacy[facility]   [id number] [place],  - [address] AT [address] [place] [address] Phone: [phone] Fax: [phone]     Social Determinants of Health (SDOH) Interventions    Readmission Risk Interventions No flowsheet data found.

## 2018-11-27 NOTE — Evaluation (Signed)
Physical Therapy Evaluation Patient Details Name: Tonya Harper MRN: [id number] DOB: May 24, 1945 Today's Date: 11/27/2018   History of Present Illness  Pt adm with chest pain. Pt with known CAD and underwent cath 10/29. Recommend continued medical treatment. Pt also with acute on chronic combined heart failure. PMH - cad, htn, pad, chf, copd, ischemic cardiomyopathy  Clinical Impression  Pt doing well with mobility and no further PT needed.  Ready for dc from PT standpoint.      Follow Up Recommendations No PT follow up    Equipment Recommendations  None recommended by PT    Recommendations for Other Services       Precautions / Restrictions Precautions Precautions: None      Mobility  Bed Mobility               General bed mobility comments: Pt sitting EOB  Transfers Overall transfer level: Modified independent Equipment used: 4-wheeled walker                Ambulation/Gait Ambulation/Gait assistance: Modified independent (Device/Increase time) Gait Distance (Feet): 300 Feet Assistive device: 4-wheeled walker Gait Pattern/deviations: Step-through pattern Gait velocity: adequate Gait velocity interpretation: 1.31 - 2.62 ft/sec, indicative of limited community ambulator General Gait Details: Steady gait with rollator. SpO2 on RA 88%. SpO2 on 2L 95%.  Stairs            Wheelchair Mobility    Modified Rankin (Stroke Patients Only)       Balance Overall balance assessment: Mild deficits observed, not formally tested                                           Pertinent Vitals/Pain Pain Assessment: No/denies pain    Home Living Family/patient expects to be discharged to:: Private residence Living Arrangements: Spouse/significant other Available Help at Discharge: Friend(s);Family;Available PRN/intermittently Type of Home: House Home Access: Stairs to enter Entrance Stairs-Rails: None Entrance Stairs-Number of Steps:  3 Home Layout: One level Home Equipment: Walker - 4 wheels;Shower seat;Bedside commode Additional Comments: Pt has O2 at home which she uses as needed.     Prior Function Level of Independence: Independent with assistive device(s)         Comments: Uses rollator at times and O2 as needed. Drives, shopping     Hand Dominance   Dominant Hand: Right    Extremity/Trunk Assessment        Lower Extremity Assessment Lower Extremity Assessment: Generalized weakness       Communication   Communication: No difficulties  Cognition Arousal/Alertness: Awake/alert Behavior During Therapy: WFL for tasks assessed/performed Overall Cognitive Status: Within Functional Limits for tasks assessed                                        General Comments      Exercises     Assessment/Plan    PT Assessment Patent does not need any further PT services  PT Problem List         PT Treatment Interventions      PT Goals (Current goals can be found in the Care Plan section)  Acute Rehab PT Goals PT Goal Formulation: All assessment and education complete, DC therapy    Frequency     Barriers to discharge  Co-evaluation               AM-PAC PT "6 Clicks" Mobility  Outcome Measure Help needed turning from your back to your side while in a flat bed without using bedrails?: None Help needed moving from lying on your back to sitting on the side of a flat bed without using bedrails?: None Help needed moving to and from a bed to a chair (including a wheelchair)?: None Help needed standing up from a chair using your arms (e.g., wheelchair or bedside chair)?: None Help needed to walk in hospital room?: None Help needed climbing 3-5 steps with a railing? : A Little 6 Click Score: 23    End of Session Equipment Utilized During Treatment: Oxygen Activity Tolerance: Patient tolerated treatment well Patient left: in bed;with call bell/phone within  reach(sitting EOB) Nurse Communication: Mobility status PT Visit Diagnosis: Muscle weakness (generalized) (M62.81)    Time: 0912-0926 PT Time Calculation (min) (ACUTE ONLY): 14 min   Charges:   PT Evaluation $PT Eval Low Complexity: 1 [place] Pager [phone] Office [place] 11/27/2018, 9:40 AM

## 2018-11-27 NOTE — Telephone Encounter (Signed)
TOC- patient currently admitted 

## 2018-11-27 NOTE — Progress Notes (Addendum)
Progress Note  Patient Name: Tonya Harper Date of Encounter: 11/27/2018  Primary Cardiologist: Kirk Ruths, MD   Subjective   She denies chest pain, has not walked in halls yet.  Inpatient Medications    Scheduled Meds:  apixaban  5 mg Oral BID   aspirin EC  81 mg Oral Daily   atorvastatin  40 mg Oral q1800   carvedilol  25 mg Oral BID WC   fluticasone furoate-vilanterol  1 puff Inhalation Daily   And   umeclidinium bromide  1 puff Inhalation Daily   ipratropium-albuterol  3 mL Inhalation TID   levothyroxine  25 mcg Oral Daily   pantoprazole  40 mg Oral Q breakfast   ranolazine  500 mg Oral BID   sodium chloride flush  3 mL Intravenous Q12H   Continuous Infusions:  sodium chloride     PRN Meds: sodium chloride, acetaminophen, albuterol, promethazine, sodium chloride flush   Vital Signs    Vitals:   11/27/18 0420 11/27/18 0425 11/27/18 0807 11/27/18 0934  BP: (!) 122/59  127/66   Pulse: 62   77  Resp: 17  18   Temp: 98.1 F (36.7 C)     TempSrc: Oral     SpO2: 99%  98%   Weight:  37.7 kg    Height:        Intake/Output Summary (Last 24 hours) at 11/27/2018 1128 Last data filed at 11/27/2018 0420 Gross per 24 hour  Intake 240 ml  Output 575 ml  Net -335 ml   Last 3 Weights 11/27/2018 11/26/2018 11/16/2018  Weight (lbs) 83 lb 3.2 oz 80 lb 3.2 oz 86 lb  Weight (kg) 37.739 kg 36.378 kg 39.009 kg  Some encounter information is confidential and restricted. Go to Review Flowsheets activity to see all data.      Telemetry    Sinus in the 70s, PVCs - Personally Reviewed  ECG    No new tracings - Personally Reviewed  Physical Exam   GEN: No acute distress.   Neck: No JVD Cardiac: RRR, no murmurs, rubs, or gallops.  Respiratory: coarse sounds throughout GI: Soft, nontender, non-distended  MS: No edema; No deformity. Neuro:  Nonfocal  Psych: Normal affect  Right radial cath site C/D/I, no hematoma  Labs    High Sensitivity  Troponin:   Recent Labs  Lab 11/24/18 1942 11/24/18 2345  TROPONINIHS 17 21*      Chemistry Recent Labs  Lab 11/24/18 1942 11/26/18 0446 11/26/18 1431 11/26/18 1438 11/27/18 0407  NA 135 137 140 141 139  K 3.3* 4.5 4.3 4.3 4.3  CL 100 101  --   --  103  CO2 25 28  --   --  29  GLUCOSE 84 121*  --   --  79  BUN 6* 20  --   --  21  CREATININE 0.98 1.05*  --   --  1.10*  CALCIUM 8.9 8.8*  --   --  8.9  GFRNONAA 58* 53*  --   --  50*  GFRAA >60 >60  --   --  58*  ANIONGAP 10 8  --   --  7     Hematology Recent Labs  Lab 11/24/18 1942 11/26/18 0446 11/26/18 1431 11/26/18 1438  WBC 9.0 6.9  --   --   RBC 3.83* 3.73*  --   --   HGB 10.9* 10.7* 10.5* 10.5*  HCT 35.5* 34.0* 31.0* 31.0*  MCV 92.7 91.2  --   --  MCH 28.5 28.7  --   --   MCHC 30.7 31.5  --   --   RDW 13.3 13.0  --   --   PLT 232 221  --   --     BNP Recent Labs  Lab 11/25/18 0932  BNP 318.2*     DDimer No results for input(s): DDIMER in the last 168 hours.   Radiology    Vas [place] Lower Extremity Venous (dvt) (mc And Wl 7a-7p)  Result Date: 11/26/2018  Lower Venous Study Indications: Swelling.  Comparison Study: No prior study Performing Technologist: Maudry Mayhew MHA, RDMS, RVT, RDCS  Examination Guidelines: A complete evaluation includes B-mode imaging, spectral Doppler, color Doppler, and power Doppler as needed of all accessible portions of each vessel. Bilateral testing is considered an integral part of a complete examination. Limited examinations for reoccurring indications may be performed as noted.  +---------+---------------+---------+-----------+----------+--------------+  LEFT      Compressibility Phasicity Spontaneity Properties Thrombus Aging  +---------+---------------+---------+-----------+----------+--------------+  CFV       Full            Yes       Yes                                    +---------+---------------+---------+-----------+----------+--------------+  SFJ       Full                                                              +---------+---------------+---------+-----------+----------+--------------+  FV Prox   Full                                                             +---------+---------------+---------+-----------+----------+--------------+  FV Mid    Full                                                             +---------+---------------+---------+-----------+----------+--------------+  FV Distal Full                                                             +---------+---------------+---------+-----------+----------+--------------+  PFV       Full                                                             +---------+---------------+---------+-----------+----------+--------------+  POP       Full            Yes  Yes                                    +---------+---------------+---------+-----------+----------+--------------+  PTV       Full                                                             +---------+---------------+---------+-----------+----------+--------------+  PERO      Full                                                             +---------+---------------+---------+-----------+----------+--------------+     Summary: Left: There is no evidence of deep vein thrombosis in the lower extremity. No cystic structure found in the popliteal fossa.  *See table(s) above for measurements and observations. Electronically signed by Servando Snare MD on 11/26/2018 at 4:26:48 PM.    Final     Cardiac Studies   TTE: 11/25/18  1. Left ventricular ejection fraction, by visual estimation, is 45 to 50%. The left ventricle has mildly decreased function. There is no left ventricular hypertrophy. 2. Left ventricular diastolic parameters are consistent with Grade I diastolic dysfunction (impaired relaxation). 3. Global right ventricle has normal systolic function.The right ventricular size is normal. No increase in right ventricular wall thickness. 4.  Left atrial size was moderately dilated. 5. Right atrial size was normal. 6. The mitral valve is normal in structure. Mild to moderate mitral valve regurgitation. No evidence of mitral stenosis. 7. The tricuspid valve is normal in structure. Tricuspid valve regurgitation is mild. 8. The aortic valve is normal in structure. Aortic valve regurgitation is mild. No evidence of aortic valve sclerosis or stenosis. 9. There is Mild calcification of the aortic valve. 10. There is Moderate thickening of the aortic valve. 11. The pulmonic valve was normal in structure. Pulmonic valve regurgitation is not visualized. 12. Mildly elevated pulmonary artery systolic pressure. 13. The inferior vena cava is normal in size with greater than 50% respiratory variability, suggesting right atrial pressure of 3 mmHg.   Right and left heart cath 11/26/18:  Mid LAD lesion is 25% stenosed.  Mid Cx lesion is 100% stenosed. Left to left collaterals.  Mid RCA lesion is 100% stenosed. Left to right collaterals.  Dist LM lesion is 25% stenosed.  There is mild left ventricular systolic dysfunction.  The left ventricular ejection fraction is 45-50% by visual estimate.  LV end diastolic pressure is normal.  There is no aortic valve stenosis.  Ao sat 99%, PA sat 76%, mean PA pressure 24 mm Hg; mean PCWP 7 mm Hg; CO 5.25 L/min; CI 3.97   No target for PCI.  Continue medical therapy.   Normal right heart pressures.    Patient Profile     73 y.o. female with a history of CAD, ischemic cardiomyopathy, paroxysmal atrial fibrillation, PAD, COPD on O2 as needed at home, hypertension, and HLD. She presented to [facility] with onset of CP. Cardiology was consulted.  Assessment & Plan    1. Chest pain 2. Coronary artery disease by heart cath  in 2010 - known CAD by heart cath 2010 with medical management recommended at that time; myoview 2013 with prior area of infarct; dobutamine echo 2017 no new wall motion  abnormalities with stress - HS troponin 17 --> 21 - given her known disease, pt underwent left and right heart catheterization on 11/26/18 which revealed 100% occlusion in the mid RCA and mid Cx with left to right collaterals and left to left collaterals. - no target for PCI, no intervention, medical therapy recommended - continue ASA, BB, 40 mg lipitor - no plavix with eliquis - will add ranexa for chest pain that occurs with hypoxia, BP likely won't tolerate imdur - given her COPD, may need to consider bisoprolol in the future   3. Acute on chronic combined systolic and diastolic heart failure Mildly reduced EF on echo - 45-50%, was 20-25% in 2010-2011 - right heart cath with normal LVEDP and normal right heart pressures - BNP mildly elevated to 318 - continue coreg 25 mg BID - 20 mg lasix PRN - likely contributing to her respirator failure   4. Hyperlipidemia - 09/18/2018: Cholesterol, Total 182; HDL 69; LDL Calculated 88; Triglycerides 127 - pravastatin 20 mg transitioned to lipitor 40 mg - repeat lipid panel in 6 weeks, may need to increase to 80 mg lipitor outpatient   5. Paroxysmal atrial fibrillation - continue eliquis for a CHA2DS2-VASc Score and unadjusted Ischemic Stroke Rate (% per year) is equal to 4.8 % stroke rate/year from a score of 4 (age, female, HTN, MI) - sinus rhythm on telemetry - resume eliquis this morning   6. Hypertension - continue coreg - pressures well-controlled    7. PAD - followed by Dr. Gwenlyn Found   She needs to walk this morning. If she ambulates well, may be discharged home. I will make follow up in the cardiology clinic.    [facility] will sign off.   Medication Recommendations:  As in [facility] -add Ranexa 500 mg p.o. twice daily Other recommendations (labs, testing, etc):  Fasting lipids in 6 weeks Follow up as an outpatient:  1-2 weeks with cardiology, appt will be made  For questions or updates, please contact [place] Please  consult [email or web address] for contact info under        Signed, Ledora Bottcher, PA  11/27/2018, 11:28 AM      ATTENDING ATTESTATION  I have seen, examined and evaluated the patient this AM along with Angie Duke, PA-C.  After reviewing all the available data and chart, we discussed the patients laboratory, study & physical findings as well as symptoms in detail. I agree with her findings, examination as well as impression recommendations as per our discussion.    Extremely frail elderly woman.  Has yet to walk today. I have personally reviewed her Films and agree that there is no real change from previous films from 2010.  She is basically living collaterals from the LAD diagonal system with one OM branch from the circumflex.  RCA and mid circumflex occluded.  Not any PCI options, probably related to some microvascular disease.  Thankfully the right heart cath pressures are normal indicating no evidence of pulmonary hypertension with her severe COPD.  She is on max dose of carvedilol (would potentially consider switching to a more beta 1 selective agent in the outpatient setting) -> is on aspirin and statin as well.  Not on Plavix because of Eliquis. Borderline pressures therefore I would like to try treating medically with Ranexa (this may become cost  prohibitive, but will try with medical assistance).  No signs of A. fib.  Restart Eliquis today No active signs of heart failure.  Right heart cath numbers look fine.  Totally euvolemic.  Agree with only as needed Lasix.  BNP elevation is probably simply chronic.   She seems relatively stable overall from a cardiac standpoint.  Other than starting the Ranexa no further medication changes recommended.  We will sign off for now.   Glenetta Hew, M.D., M.S. Interventional Cardiologist   Pager # [phone] Phone # [phone] [address] [place], [place] [address]

## 2018-12-01 ENCOUNTER — Telehealth: Payer: Self-pay | Admitting: Cardiology

## 2018-12-01 NOTE — Telephone Encounter (Signed)
Pt c/o medication issue:  1. Name of Medication:  Ranolaze  2. How are you currently taking this medication (dosage and times per day)?  2  times a day  3. Are you having a reaction (difficulty breathing--STAT)? no  4. What is your medication issue? Nauseated, broke out in a cold sweat, vomiting

## 2018-12-01 NOTE — Telephone Encounter (Signed)
Spoke with pt, she took the first dose of ranolaze yesterday and she got a little nauseated and lightheaded. This morning after taking it she got very sick. Dry mouth, sweating, nausea and vomited all her medications back up. She feels better now. She has  Narrowing of the esophagus and is cutting the pills in 1/2 to be able to swallow them but she starts they crushed them in the hospital. Will forward for dr Stanford Breed review

## 2018-12-01 NOTE — Telephone Encounter (Signed)
Spoke with pt, Aware of dr crenshaw's recommendations.  °

## 2018-12-01 NOTE — Telephone Encounter (Signed)
DC Tonya Harper Tonya Harper Tonya Harper

## 2018-12-01 NOTE — Telephone Encounter (Signed)
Patient contacted regarding discharge from [facility] on 11/27/2018.  Patient understands to follow up with provider Kerin Ransom, [place] on 11/06 at 8:30 AM at [facility]. Patient understands discharge instructions? yes Patient understands medications and regiment? yes Patient understands to bring all medications to this visit? Yes

## 2018-12-04 ENCOUNTER — Encounter: Payer: Self-pay | Admitting: Cardiology

## 2018-12-04 ENCOUNTER — Ambulatory Visit (INDEPENDENT_AMBULATORY_CARE_PROVIDER_SITE_OTHER): Payer: Medicare Other | Admitting: Cardiology

## 2018-12-04 ENCOUNTER — Other Ambulatory Visit: Payer: Self-pay

## 2018-12-04 VITALS — BP 116/63 | HR 70 | Temp 97.2°F | Ht 64.0 in | Wt 80.0 lb

## 2018-12-04 DIAGNOSIS — I25110 Atherosclerotic heart disease of native coronary artery with unstable angina pectoris: Secondary | ICD-10-CM

## 2018-12-04 DIAGNOSIS — I214 Non-ST elevation (NSTEMI) myocardial infarction: Secondary | ICD-10-CM

## 2018-12-04 DIAGNOSIS — I255 Ischemic cardiomyopathy: Secondary | ICD-10-CM | POA: Diagnosis not present

## 2018-12-04 DIAGNOSIS — I739 Peripheral vascular disease, unspecified: Secondary | ICD-10-CM | POA: Diagnosis not present

## 2018-12-04 DIAGNOSIS — I10 Essential (primary) hypertension: Secondary | ICD-10-CM | POA: Diagnosis not present

## 2018-12-04 DIAGNOSIS — J42 Unspecified chronic bronchitis: Secondary | ICD-10-CM | POA: Diagnosis not present

## 2018-12-04 DIAGNOSIS — E43 Unspecified severe protein-calorie malnutrition: Secondary | ICD-10-CM

## 2018-12-04 MED ORDER — NITROGLYCERIN 0.2 MG/HR TD PT24: 0.2000 mg | MEDICATED_PATCH | Freq: Every day | TRANSDERMAL | 12 refills | Status: DC

## 2018-12-04 NOTE — Patient Instructions (Signed)
Medication Instructions:  START Nitroglycerin patches 0.2mg  Put one patch on in the monring and remove it at night  *If you need a refill on your cardiac medications before your next appointment, please call your pharmacy*  Lab Work: None  If you have labs (blood work) drawn today and your tests are completely normal, you will receive your results only by: Marland Kitchen MyChart Message (if you have MyChart) OR . A paper copy in the mail If you have any lab test that is abnormal or we need to change your treatment, we will call you to review the results.  Testing/Procedures: None   Follow-Up: At [facility], you and your health needs are our priority.  As part of our continuing mission to provide you with exceptional heart care, we have created designated Provider Care Teams.  These Care Teams include your primary Cardiologist (physician) and Advanced Practice Providers (APPs -  Physician Assistants and Nurse Practitioners) who all work together to provide you with the care you need, when you need it.  Your next appointment:   2-3 WEEKS  The format for your next appointment:   Virtual Visit   Provider:   Kerin Ransom, PA-C  Other Instructions

## 2018-12-04 NOTE — Assessment & Plan Note (Signed)
Admitted 11/25/2018 with chest pain- HS Troponin peak-21- Cath- medical Rx.  She could not tolerate Ranexa- nausea

## 2018-12-04 NOTE — Assessment & Plan Note (Signed)
The patient was advised to use resource supplements in between meals a least 2-3 times daily
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
## 2018-12-04 NOTE — Assessment & Plan Note (Signed)
EF 45-50% by echo 11/25/2018
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
## 2018-12-04 NOTE — Assessment & Plan Note (Signed)
Cath 11/26/2018- occluded mRCA, occluded mCFX with L-L collaterals, 25% LAD. Plan is for medical Rx

## 2018-12-04 NOTE — Assessment & Plan Note (Signed)
Controlled.  

## 2018-12-04 NOTE — Assessment & Plan Note (Signed)
O2 PRN

## 2018-12-08 DIAGNOSIS — E559 Vitamin D deficiency, unspecified: Secondary | ICD-10-CM | POA: Diagnosis not present

## 2018-12-08 DIAGNOSIS — M810 Age-related osteoporosis without current pathological fracture: Secondary | ICD-10-CM | POA: Diagnosis not present

## 2018-12-15 ENCOUNTER — Telehealth: Payer: Self-pay

## 2018-12-15 ENCOUNTER — Encounter: Payer: Self-pay | Admitting: Cardiology

## 2018-12-15 ENCOUNTER — Telehealth (INDEPENDENT_AMBULATORY_CARE_PROVIDER_SITE_OTHER): Payer: Medicare Other | Admitting: Cardiology

## 2018-12-15 VITALS — Ht 64.0 in | Wt 85.0 lb

## 2018-12-15 DIAGNOSIS — E785 Hyperlipidemia, unspecified: Secondary | ICD-10-CM | POA: Insufficient documentation

## 2018-12-15 DIAGNOSIS — I2510 Atherosclerotic heart disease of native coronary artery without angina pectoris: Secondary | ICD-10-CM

## 2018-12-15 DIAGNOSIS — I739 Peripheral vascular disease, unspecified: Secondary | ICD-10-CM

## 2018-12-15 DIAGNOSIS — J42 Unspecified chronic bronchitis: Secondary | ICD-10-CM

## 2018-12-15 DIAGNOSIS — I10 Essential (primary) hypertension: Secondary | ICD-10-CM
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
## 2018-12-15 NOTE — Patient Instructions (Signed)
Medication Instructions:  STOP Lipitor *If you need a refill on your cardiac medications before your next appointment, please call your pharmacy*  Lab Work: None  If you have labs (blood work) drawn today and your tests are completely normal, you will receive your results only by: Marland Kitchen MyChart Message (if you have MyChart) OR . A paper copy in the mail If you have any lab test that is abnormal or we need to change your treatment, we will call you to review the results.  Testing/Procedures: None   Follow-Up: At [facility], you and your health needs are our priority.  As part of our continuing mission to provide you with exceptional heart care, we have created designated Provider Care Teams.  These Care Teams include your primary Cardiologist (physician) and Advanced Practice Providers (APPs -  Physician Assistants and Nurse Practitioners) who all work together to provide you with the care you need, when you need it.  Your next appointment:   3-4 MONTHS   The format for your next appointment:   Virtual Visit   Provider:   You may see Kirk Ruths, MD or one of the following Advanced Practice Providers on your designated Care Team:    Kerin Ransom, PA-C  [place], [place]  Coletta Memos, [place]   Other Instructions

## 2018-12-15 NOTE — Telephone Encounter (Signed)
Contacted patient to discuss AVS Instructions. Gave patient Luke's recommendations from today's virtual office visit. Informed patient that someone from the scheduling dept will be in contact with them to schedule their follow up appt. Patient voiced understanding and AVS mailed.    

## 2018-12-15 NOTE — Telephone Encounter (Signed)

## 2018-12-16 NOTE — Addendum Note (Signed)
Addended by: Ulice Brilliant T on: 12/16/2018 07:04 AM   Modules accepted: Orders

## 2018-12-17 ENCOUNTER — Telehealth: Payer: Medicare Other | Admitting: Cardiology
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
## 2019-01-19 ENCOUNTER — Ambulatory Visit (INDEPENDENT_AMBULATORY_CARE_PROVIDER_SITE_OTHER): Payer: Medicare Other | Admitting: Pharmacist Clinician (PhC)/ Clinical Pharmacy Specialist

## 2019-01-19 ENCOUNTER — Other Ambulatory Visit: Payer: Self-pay

## 2019-01-19 VITALS — BP 106/78 | HR 73 | Resp 15 | Ht 64.0 in | Wt 84.6 lb

## 2019-01-19 DIAGNOSIS — I255 Ischemic cardiomyopathy: Secondary | ICD-10-CM | POA: Diagnosis not present

## 2019-01-19 DIAGNOSIS — G720 Drug-induced myopathy: Secondary | ICD-10-CM | POA: Diagnosis not present

## 2019-01-19 DIAGNOSIS — T466X5A Adverse effect of antihyperlipidemic and antiarteriosclerotic drugs, initial encounter: Secondary | ICD-10-CM | POA: Diagnosis not present

## 2019-01-19 DIAGNOSIS — E7800 Pure hypercholesterolemia, unspecified: Secondary | ICD-10-CM

## 2019-01-19 DIAGNOSIS — E785 Hyperlipidemia, unspecified: Secondary | ICD-10-CM | POA: Diagnosis not present

## 2019-01-19 MED ORDER — NEXLETOL 180 MG PO TABS: 180.0000 mg | ORAL_TABLET | Freq: Every day | ORAL | 1 refills | Status: DC

## 2019-01-19 NOTE — Patient Instructions (Addendum)
Your Results:             Your most recent labs (08/2018) Goal  Total Cholesterol 182 < 200  Triglycerides 127 < 150  HDL (happy/good cholesterol) 69 > 40  LDL (lousy/bad cholesterol 88 < 70   < 100   Medication changes:  Start Nexeltol 180 mg once daily  Lab orders:  Go to the lab today to get cholesterol levels checked   Patient Assistance:  The [employer] foundation offers assistance to help pay for medication copays.  They will cover copays for all cholesterol lowering meds, including statins, fibrates, omega-3 oils, ezetimibe, Repatha, Praluent, Nexletol, Nexlizet.  The cards are usually good for $2,500 or 12 months, whichever comes first. 1. Go to healthwellfoundation.org 2. Click on "Apply Now" 3. Answer questions as to whom is applying (patient or representative) 4. Your disease fund will be "hypercholesterolemia - Medicare access" 5. They will ask questions about finances and which medications you are taking for cholesterol 6. When you submit, the approval is usually within minutes.  You will need to print the card information from the site 7. You will need to show this information to your pharmacy, they will bill your Medicare Part D plan first -then bill [employer] --for the copay.   You can also call them at [phone], although the hold times can be quite long.   Thank you for choosing [facility]
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
## 2019-01-20 ENCOUNTER — Encounter: Payer: Self-pay | Admitting: Pharmacist Clinician (PhC)/ Clinical Pharmacy Specialist

## 2019-01-20 LAB — LIPID PANEL
Chol/HDL Ratio: 3.8 ratio (ref 0.0–4.4)
Cholesterol, Total: 161 mg/dL (ref 100–199)
HDL: 42 mg/dL (ref >39)
LDL Chol Calc (NIH): 102 mg/dL — ABNORMAL HIGH (ref 0–99)
Triglycerides: 91 mg/dL (ref 0–149)
VLDL Cholesterol Cal: 17 mg/dL (ref 5–40)

## 2019-01-20 LAB — HEPATIC FUNCTION PANEL
ALT: 4 IU/L (ref 0–32)
AST: 9 IU/L (ref 0–40)
Albumin: 3.6 g/dL — ABNORMAL LOW (ref 3.7–4.7)
Alkaline Phosphatase: 22 IU/L — ABNORMAL LOW (ref 39–117)
Bilirubin Total: 0.8 mg/dL (ref 0.0–1.2)
Bilirubin, Direct: 0.18 mg/dL (ref 0.00–0.40)
Total Protein: 6.4 g/dL (ref 6.0–8.5)

## 2019-01-20 NOTE — Assessment & Plan Note (Signed)
Patient with known ASCVD and PAD, currently not at goal and statin intolerant.  Reviewed options for ezetimibe, PCSK-9 inhibitors and bempedoic acid.  Patient admits to needle-phobia.  Also, pt is quite thin at 5'4" and only 38 kg.  She literally does not have any belly fat for injections.  After discussion, she will start with bempedoic acid (Nexletol) 180 mg daily.  Will give her samples for 2 weeks until we can get labs back and determine if PA is needed, or if this will be a covered medication in 2021.  Patient aware to contact us if any problems with swallowing medication or side effects.  If she tolerates, will have her repeat labs in 3 months.

## 2019-01-21 ENCOUNTER — Encounter: Payer: Self-pay | Admitting: *Deleted

## 2019-01-21 NOTE — Telephone Encounter (Signed)
-----   Message from [name], MD sent at 01/20/2019  7:45 AM EST ----- DC pravastatin; crestor 40 mg daily; lipids and liver 12 weeks [facility]

## 2019-01-21 NOTE — Telephone Encounter (Signed)
This encounter was created in error - please disregard.

## 2019-01-26 ENCOUNTER — Encounter

## 2019-01-26 ENCOUNTER — Ambulatory Visit: Payer: Medicare Other
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
## 2019-02-23 ENCOUNTER — Ambulatory Visit: Payer: Medicare Other | Admitting: Critical Care Medicine

## 2019-02-24 ENCOUNTER — Encounter: Payer: Self-pay | Admitting: Critical Care Medicine

## 2019-02-24 ENCOUNTER — Encounter: Payer: Self-pay | Admitting: Cardiology

## 2019-02-24 ENCOUNTER — Other Ambulatory Visit: Payer: Self-pay

## 2019-02-24 ENCOUNTER — Other Ambulatory Visit: Payer: Self-pay | Admitting: Internal Medicine

## 2019-02-24 ENCOUNTER — Other Ambulatory Visit: Payer: Self-pay | Admitting: *Deleted

## 2019-02-24 ENCOUNTER — Ambulatory Visit (INDEPENDENT_AMBULATORY_CARE_PROVIDER_SITE_OTHER): Payer: Medicare Other | Admitting: Cardiology

## 2019-02-24 ENCOUNTER — Ambulatory Visit (INDEPENDENT_AMBULATORY_CARE_PROVIDER_SITE_OTHER): Payer: Medicare Other | Admitting: Critical Care Medicine

## 2019-02-24 VITALS — BP 106/60 | HR 73 | Temp 97.7°F | Ht 64.0 in | Wt 83.6 lb

## 2019-02-24 VITALS — BP 126/60 | HR 75 | Ht 64.0 in | Wt 83.0 lb

## 2019-02-24 DIAGNOSIS — I714 Abdominal aortic aneurysm, without rupture, unspecified: Secondary | ICD-10-CM

## 2019-02-24 DIAGNOSIS — I10 Essential (primary) hypertension: Secondary | ICD-10-CM | POA: Diagnosis not present

## 2019-02-24 DIAGNOSIS — R64 Cachexia: Secondary | ICD-10-CM

## 2019-02-24 DIAGNOSIS — R1310 Dysphagia, unspecified: Secondary | ICD-10-CM

## 2019-02-24 DIAGNOSIS — R5381 Other malaise: Secondary | ICD-10-CM

## 2019-02-24 DIAGNOSIS — E43 Unspecified severe protein-calorie malnutrition: Secondary | ICD-10-CM | POA: Diagnosis not present

## 2019-02-24 DIAGNOSIS — I255 Ischemic cardiomyopathy: Secondary | ICD-10-CM | POA: Diagnosis not present

## 2019-02-24 DIAGNOSIS — E785 Hyperlipidemia, unspecified: Secondary | ICD-10-CM

## 2019-02-24 DIAGNOSIS — J449 Chronic obstructive pulmonary disease, unspecified: Secondary | ICD-10-CM

## 2019-02-24 DIAGNOSIS — J9611 Chronic respiratory failure with hypoxia: Secondary | ICD-10-CM | POA: Diagnosis not present

## 2019-02-24 DIAGNOSIS — R1319 Other dysphagia: Secondary | ICD-10-CM

## 2019-02-24 DIAGNOSIS — I5042 Chronic combined systolic (congestive) and diastolic (congestive) heart failure: Secondary | ICD-10-CM

## 2019-02-24 DIAGNOSIS — Z7189 Other specified counseling: Secondary | ICD-10-CM

## 2019-02-24 DIAGNOSIS — Z8719 Personal history of other diseases of the digestive system: Secondary | ICD-10-CM

## 2019-02-24 DIAGNOSIS — Z1231 Encounter for screening mammogram for malignant neoplasm of breast: Secondary | ICD-10-CM

## 2019-02-24 DIAGNOSIS — I25110 Atherosclerotic heart disease of native coronary artery with unstable angina pectoris: Secondary | ICD-10-CM

## 2019-02-24 MED ORDER — ALBUTEROL SULFATE (2.5 MG/3ML) 0.083% IN NEBU: 2.5000 mg | INHALATION_SOLUTION | Freq: Four times a day (QID) | RESPIRATORY_TRACT | 11 refills | Status: DC | PRN

## 2019-02-24 MED ORDER — TRELEGY ELLIPTA 100-62.5-25 MCG/INH IN AEPB: 1.0000 | INHALATION_SPRAY | Freq: Every day | RESPIRATORY_TRACT | 0 refills | Status: DC

## 2019-02-24 MED ORDER — ALBUTEROL SULFATE HFA 108 (90 BASE) MCG/ACT IN AERS: 2.0000 | INHALATION_SPRAY | RESPIRATORY_TRACT | 11 refills | Status: DC | PRN

## 2019-02-24 NOTE — Patient Instructions (Signed)
Medication Instructions:  STOP ASPIRIN  TAKE 40 MG OF FUROSEMIDE TODAY AND THEN TAKE 20 MG ONCE DAILY  *If you need a refill on your cardiac medications before your next appointment, please call your pharmacy*  Lab Work: Your physician recommends that you return for [place]  If you have labs (blood work) drawn today and your tests are completely normal, you will receive your results only by: Marland Kitchen MyChart Message (if you have MyChart) OR . A paper copy in the mail If you have any lab test that is abnormal or we need to change your treatment, we will call you to review the results.   Follow-Up: At [facility], you and your health needs are our priority.  As part of our continuing mission to provide you with exceptional heart care, we have created designated Provider Care Teams.  These Care Teams include your primary Cardiologist (physician) and Advanced Practice Providers (APPs -  Physician Assistants and Nurse Practitioners) who all work together to provide you with the care you need, when you need it.  Your next appointment:   4 week(s)  The format for your next appointment:   In Person  Provider:   You may see one of the following Advanced Practice Providers on your designated Care Team:    Kerin Ransom, PA-C  [place], [place]  Coletta Memos, [place]   Your physician recommends that you schedule a follow-up appointment in: [place]

## 2019-02-24 NOTE — Patient Instructions (Addendum)
Thank you for visiting Dr. Carlis Abbott at [facility]. We recommend the following: No orders of the defined types were placed in this encounter.  No orders of the defined types were placed in this encounter.   Meds ordered this encounter  Medications  . Fluticasone-Umeclidin-Vilant (TRELEGY ELLIPTA) 100-62.5-25 MCG/INH AEPB    Sig: Inhale 1 puff into the lungs daily.    Dispense:  180 each    Refill:  0    Order Specific Question:   Lot Number?    Answer:   [id number]    Order Specific Question:   Manufacturer?    Answer:   [employer] [[id number]]  . albuterol (VENTOLIN HFA) 108 (90 Base) MCG/ACT inhaler    Sig: Inhale 2 puffs into the lungs every 4 (four) hours as needed for wheezing or shortness of breath.    Dispense:  18 g    Refill:  11  . albuterol (PROVENTIL) (2.5 MG/3ML) 0.083% nebulizer solution    Sig: Take 3 mLs (2.5 mg total) by nebulization every 6 (six) hours as needed for wheezing or shortness of breath.    Dispense:  300 mL    Refill:  11    Return in about 6 months (around 08/24/2019).    Please do your part to reduce the spread of COVID-19.
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
## 2019-03-04 LAB — BASIC METABOLIC PANEL
BUN/Creatinine Ratio: 19 (ref 12–28)
BUN: 23 mg/dL (ref 8–27)
CO2: 27 mmol/L (ref 20–29)
Calcium: 9.4 mg/dL (ref 8.7–10.3)
Chloride: 100 mmol/L (ref 96–106)
Creatinine, Ser: 1.23 mg/dL — ABNORMAL HIGH (ref 0.57–1.00)
GFR calc Af Amer: 51 mL/min/{1.73_m2} — ABNORMAL LOW (ref >59)
GFR calc non Af Amer: 44 mL/min/{1.73_m2} — ABNORMAL LOW (ref >59)
Glucose: 85 mg/dL (ref 65–99)
Potassium: 5.3 mmol/L — ABNORMAL HIGH (ref 3.5–5.2)
Sodium: 138 mmol/L (ref 134–144)

## 2019-03-04 LAB — PRO B NATRIURETIC PEPTIDE: NT-Pro BNP: 1331 pg/mL — ABNORMAL HIGH (ref 0–301)

## 2019-03-05 ENCOUNTER — Encounter: Payer: Self-pay | Admitting: *Deleted

## 2019-03-09 ENCOUNTER — Telehealth: Payer: Self-pay | Admitting: Critical Care Medicine

## 2019-03-09 NOTE — Telephone Encounter (Signed)
Hey have you seen this come through for Dr. Carlis Abbott.

## 2019-03-11 NOTE — Telephone Encounter (Signed)
Looked through Dr. Ainsley Spinner papers to see if there was a CMN in there and I did not see one in there on pt.  Rodena Piety, please advise if you have a CMN on pt from [facility]?

## 2019-03-11 NOTE — Telephone Encounter (Signed)
I have the CMN but Dr. Carlis Abbott hasn't been here to sign since I got the from

## 2019-03-12 NOTE — Telephone Encounter (Signed)
Noted  

## 2019-03-15 NOTE — Telephone Encounter (Signed)
Dr. Carlis Abbott is back in the office on 03/17/19.

## 2019-03-16 NOTE — Telephone Encounter (Signed)
Dr. Carlis Abbott signed this CMN while she was in the office today. It has been faxed and I received confirmation that the fax was received

## 2019-03-17 ENCOUNTER — Telehealth: Payer: Self-pay | Admitting: Critical Care Medicine

## 2019-03-17 NOTE — Telephone Encounter (Signed)
Spoke with pt. She is wanting a CXR done to compare to the one she had done last month at [place] is currently trying to get a copy of the images of this CXR.  Dr. Carlis Abbott - please advise if you are okay with ordering this. Thanks.

## 2019-03-17 NOTE — Telephone Encounter (Signed)
That CXR (02/02/2019) was read as no acute abnormalities. I am not sure there is anything I need to follow up from it since it didn't have acute abnormalities. Is she having new symptoms she is concerned about?  LPC

## 2019-03-17 NOTE — Telephone Encounter (Signed)
Spoke with pt. She is aware of Dr. Ainsley Spinner response. Nothing further was needed.
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
## 2019-03-24 ENCOUNTER — Ambulatory Visit (HOSPITAL_COMMUNITY)
Admission: RE | Admit: 2019-03-24 | Discharge: 2019-03-24 | Disposition: A | Discharge status: Home / Self Care | Payer: Medicare Other | Source: Ambulatory Visit | Attending: Cardiovascular Disease | Admitting: Cardiovascular Disease

## 2019-03-24 ENCOUNTER — Other Ambulatory Visit: Payer: Self-pay

## 2019-03-24 ENCOUNTER — Encounter: Payer: Self-pay | Admitting: General Practice

## 2019-03-24 ENCOUNTER — Ambulatory Visit (INDEPENDENT_AMBULATORY_CARE_PROVIDER_SITE_OTHER): Payer: Medicare Other | Admitting: General Practice

## 2019-03-24 VITALS — BP 96/52 | HR 63 | Temp 97.5°F | Ht 64.0 in | Wt 80.6 lb

## 2019-03-24 DIAGNOSIS — I739 Peripheral vascular disease, unspecified: Secondary | ICD-10-CM

## 2019-03-24 DIAGNOSIS — I10 Essential (primary) hypertension: Secondary | ICD-10-CM

## 2019-03-24 DIAGNOSIS — I255 Ischemic cardiomyopathy: Secondary | ICD-10-CM | POA: Diagnosis not present

## 2019-03-24 DIAGNOSIS — I714 Abdominal aortic aneurysm, without rupture, unspecified: Secondary | ICD-10-CM

## 2019-03-24 DIAGNOSIS — E785 Hyperlipidemia, unspecified: Secondary | ICD-10-CM

## 2019-03-24 DIAGNOSIS — I5042 Chronic combined systolic (congestive) and diastolic (congestive) heart failure: Secondary | ICD-10-CM

## 2019-03-24 DIAGNOSIS — I5043 Acute on chronic combined systolic (congestive) and diastolic (congestive) heart failure: Secondary | ICD-10-CM | POA: Diagnosis not present

## 2019-03-24 DIAGNOSIS — I480 Paroxysmal atrial fibrillation: Secondary | ICD-10-CM

## 2019-03-24 DIAGNOSIS — I2510 Atherosclerotic heart disease of native coronary artery without angina pectoris: Secondary | ICD-10-CM

## 2019-03-24 MED ORDER — EZETIMIBE 10 MG PO TABS: 10.0000 mg | ORAL_TABLET | Freq: Every day | ORAL | 3 refills | Status: DC

## 2019-03-24 NOTE — Patient Instructions (Signed)
Medication Instructions:  START ZETIA 10MG  IN THE EVENING TAKE LASIX 20MG  1 tablet daily as needed for increased ankle swelling If you need a refill on your cardiac medications before your next appointment, please call your pharmacy.  Labwork: BMET AND BNP TODAY HERE IN OUR OFFICE AT [facility]   If you have labs (blood work) drawn today and your tests are completely normal, you will receive your results only by: Marland Kitchen MyChart Message (if you have MyChart) OR A paper copy in the mail If you have any lab test that is abnormal or we need to change your treatment, we will call you to review these results. You may go to any [facility] lab that is convenient for you however, we do have a lab in our office that is able to assist you. You do NOT need an appointment for our lab. Once in our office in our office lobby there is a podium where you sign-in and ring the doorbell to alert [place] that you are here. Lab is open 8:00am and closes at 4:00pm; closes for lunch from 12:45 - 1:45pm. PLEASE BRING A COPY OF YOUR INSURANCE CARD WITH YOU.  Special Instructions: PLEASE PURCHASE AND WEAR COMPRESSION STOCKINGS DAILY AND OFF AT BEDTIME. Compression stockings are elastic socks that squeeze the legs. They help to increase blood flow to the legs and to decrease swelling in the legs from fluid retention, and reduce the chance of developing blood clots in the lower legs.   PLEASE READ AND FOLLOW SALTY 6 ATTACHED  Follow-Up: KEEP SCHEDULED FOLLOW UP  In Person Kirk Ruths, MD.    At [facility], you and your health needs are our priority.  As part of our continuing mission to provide you with exceptional heart care, we have created designated Provider Care Teams.  These Care Teams include your primary Cardiologist (physician) and Advanced Practice Providers (APPs -  Physician Assistants and Nurse Practitioners) who all work together to provide you with the care you need, when you need it.  Reduce your risk of getting  COVID-19 With your heart disease it is especially important for people at increased risk of severe illness from COVID-19, and those who live with them, to protect themselves from getting COVID-19. The best way to protect yourself and to help reduce the spread of the virus that causes COVID-19 is to: Marland Kitchen Limit your interactions with other people as much as possible. . Take COVID-19 when you do interact with others. If you start feeling sick and think you may have COVID-19, get in touch with your healthcare provider within 24 hours.  Thank you for choosing [facility] at [facility]!!

## 2019-03-24 NOTE — Addendum Note (Signed)
Addended by: Wonda Horner on: 03/24/2019 10:19 AM   Modules accepted: Orders

## 2019-03-25 LAB — BRAIN NATRIURETIC PEPTIDE: BNP: 218.2 pg/mL — ABNORMAL HIGH (ref 0.0–100.0)

## 2019-03-25 LAB — BASIC METABOLIC PANEL
BUN/Creatinine Ratio: 16 (ref 12–28)
BUN: 23 mg/dL (ref 8–27)
CO2: 27 mmol/L (ref 20–29)
Calcium: 9.5 mg/dL (ref 8.7–10.3)
Chloride: 96 mmol/L (ref 96–106)
Creatinine, Ser: 1.47 mg/dL — ABNORMAL HIGH (ref 0.57–1.00)
GFR calc Af Amer: 41 mL/min/{1.73_m2} — ABNORMAL LOW (ref >59)
GFR calc non Af Amer: 35 mL/min/{1.73_m2} — ABNORMAL LOW (ref >59)
Glucose: 79 mg/dL (ref 65–99)
Potassium: 4.8 mmol/L (ref 3.5–5.2)
Sodium: 137 mmol/L (ref 134–144)

## 2019-04-23 ENCOUNTER — Telehealth: Payer: Self-pay | Admitting: Cardiology

## 2019-04-23 ENCOUNTER — Other Ambulatory Visit: Payer: Self-pay | Admitting: Pulmonary Disease

## 2019-04-23 DIAGNOSIS — R7989 Other specified abnormal findings of blood chemistry: Secondary | ICD-10-CM

## 2019-04-23 DIAGNOSIS — I5042 Chronic combined systolic (congestive) and diastolic (congestive) heart failure: Secondary | ICD-10-CM

## 2019-04-23 NOTE — Telephone Encounter (Signed)
Pt called to report that the bilateral foot edema has really not improved since her OV with Coletta Memos NP 03/24/19.   She says she uses the Lasix 20 mg up to 3-4 times per week. She has been using the compression stockings daily and off at night. She also says that she does watch her NA intake the best she can. Pt reports hat elevating her feet causes her to have numbess and discomfort.   She denies SOB and no chest pain.   Pt is asking to see Dr. Dimas Aguas, Nephro in [facility].. she says she saw him years ago and based on her elevated Creatinine she would like to see him again but needs a referral. She asked of Dr. Stanford Breed would agree to her seeing him.    Pt is also asking for her 2/ 2021 labs be mailed to her.. Will forward to med rec.   Will forward message to Dr. Stanford Breed re:  her continued edema and any further recommendations.

## 2019-04-23 NOTE — Telephone Encounter (Signed)
Take Lasix 20 mg every day.  Check potassium and renal function in 1 week.  Okay for referral to nephrology. Kirk Ruths

## 2019-04-23 NOTE — Telephone Encounter (Signed)
LMTCB  As per my earlier note pt wants referral to Dr. Dimas Aguas in particular..... referral done.

## 2019-04-23 NOTE — Telephone Encounter (Signed)
Pt c/o swelling: STAT is pt has developed SOB within 24 hours  1) How much weight have you gained and in what time span? Doesn't know   2) If swelling, where is the swelling located? Feet and Ankles   3) Are you currently taking a fluid pill? Yes   4) Are you currently SOB? No   5) Do you have a log of your daily weights (if so, list)? No   6) Have you gained 3 pounds in a day or 5 pounds in a week? Doesn't know   7) Have you traveled recently? No   Tonya Harper is calling in regards to the swelling in her feet and ankles. She states her left foot is constantly numb and her right foot goes numb as well, but not as bad as her left. When Shaneequa tries to elevate her feet to bring the swelling down she states the numbness worsens compared to the numbness when her feet are on the ground. Alyxis is currently taking the fluid pill as suggested on the bottle, but it is not helping much at all. She would like a nurse to call her to come to a conclusion on how to resolve this reoccurring issue. Please advise.

## 2019-04-26 MED ORDER — FUROSEMIDE 20 MG PO TABS: ORAL_TABLET | ORAL | 2 refills | Status: DC

## 2019-04-26 NOTE — Addendum Note (Signed)
Addended by: Caprice Beaver T on: 04/26/2019 02:30 PM   Modules accepted: Orders

## 2019-04-26 NOTE — Telephone Encounter (Signed)
RX sent to pharmacy as I spoke to patient and she needed it sent over. Lab work ordered- patient will have it completed on Friday at a [facility] where she lives.  Patient verbalized understanding.

## 2019-04-30 IMAGING — DX DG CHEST 2V
2 series · 2 of 2 positions shown · non-contrast
Comparison: 12/30/2017

CLINICAL DATA: Increasing shortness of breath over several weeks.

EXAM:
CHEST - 2 VIEW

[chest pa]
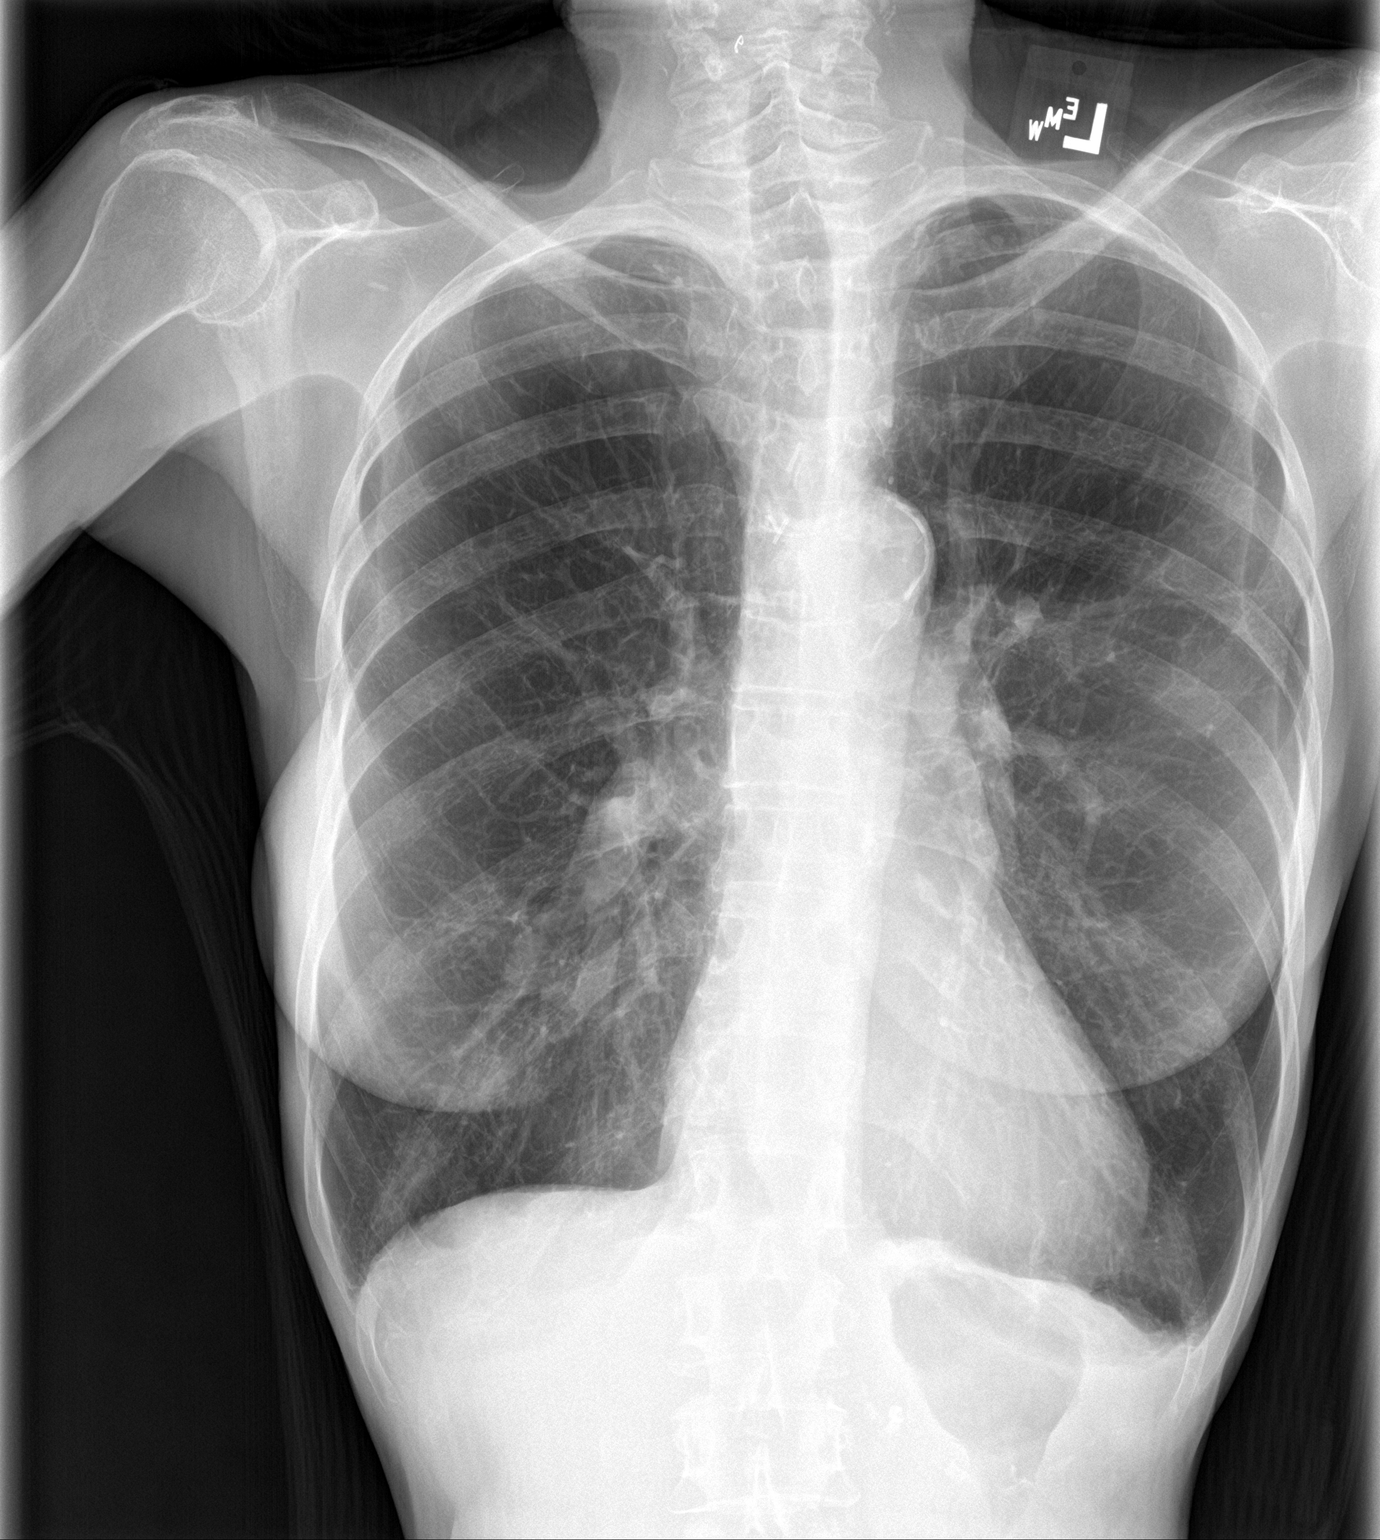

[chest lat]
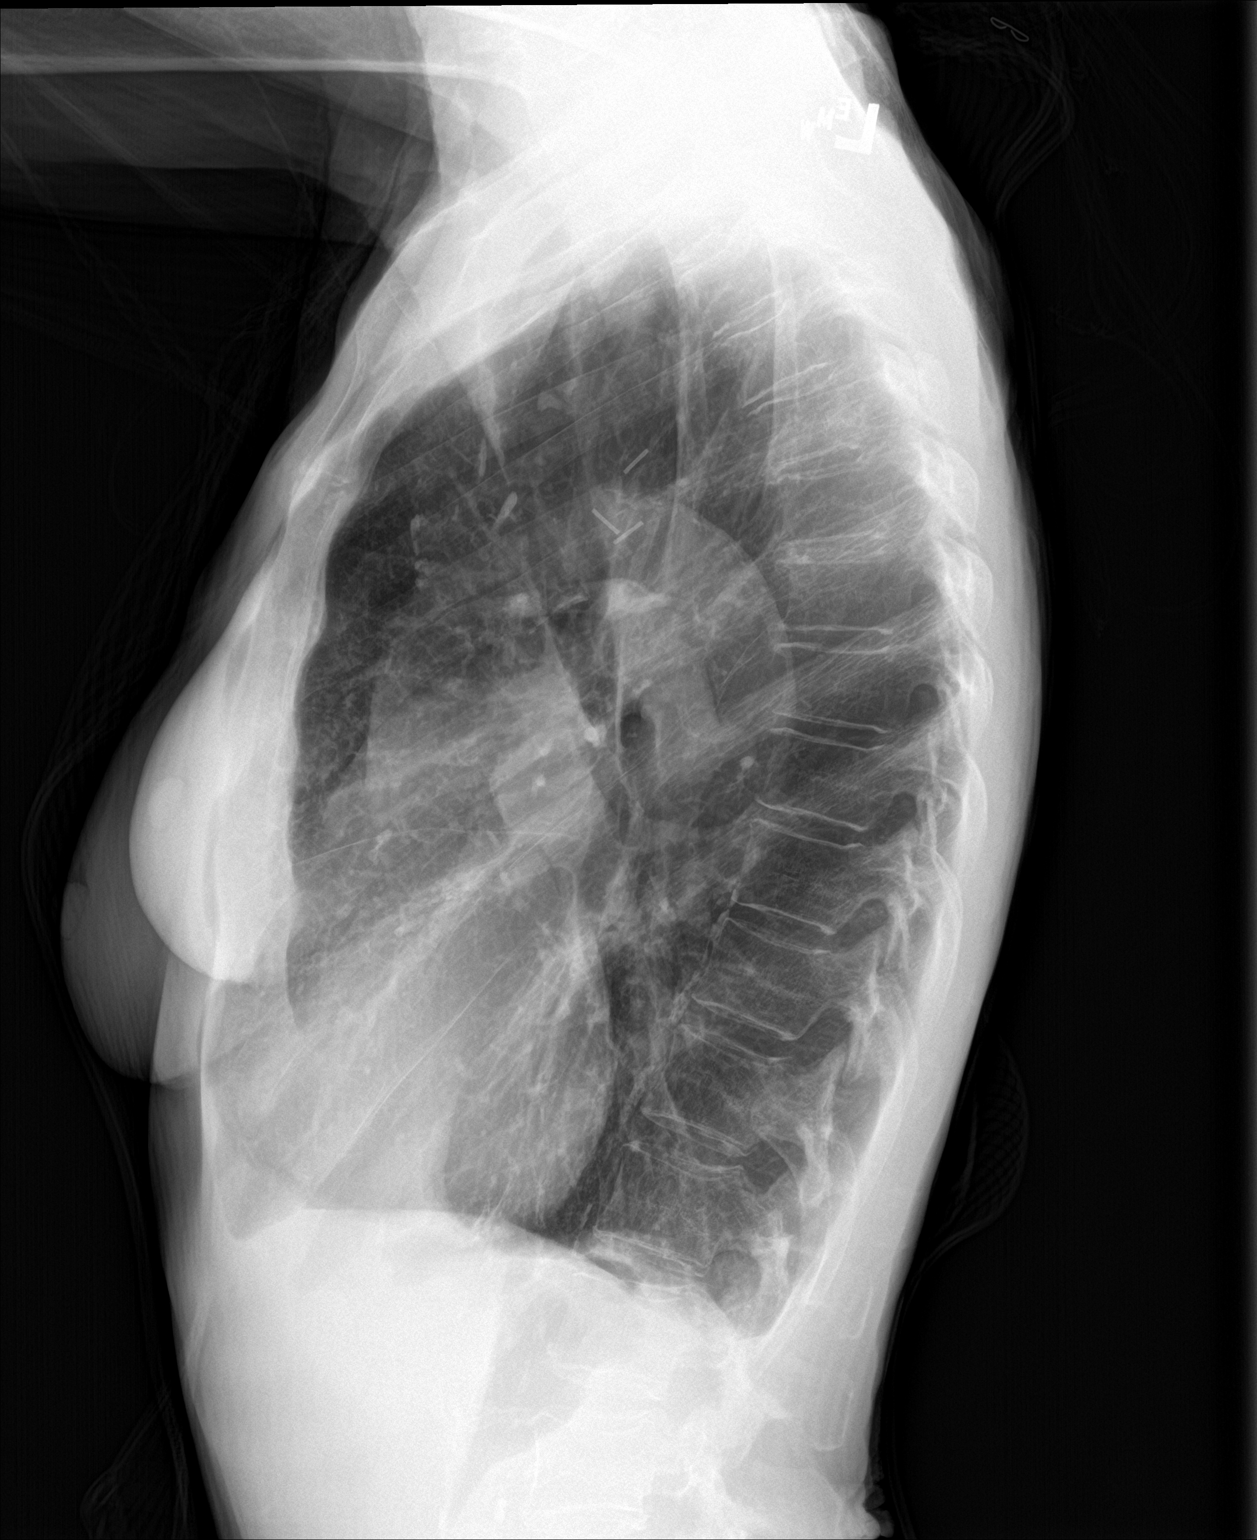

[2 of 2 positions shown; findings below may reference images not displayed]

FINDINGS: Mild cardiomegaly. Lungs are hyperaerated and clear. No pneumothorax
or pleural effusion.
IMPRESSION: No active cardiopulmonary disease.

## 2019-05-01 LAB — RENAL FUNCTION PANEL
Albumin: 4 g/dL (ref 3.7–4.7)
BUN/Creatinine Ratio: 17 (ref 12–28)
BUN: 23 mg/dL (ref 8–27)
CO2: 29 mmol/L (ref 20–29)
Calcium: 9.1 mg/dL (ref 8.7–10.3)
Chloride: 97 mmol/L (ref 96–106)
Creatinine, Ser: 1.36 mg/dL — ABNORMAL HIGH (ref 0.57–1.00)
GFR calc Af Amer: 45 mL/min/{1.73_m2} — ABNORMAL LOW (ref >59)
GFR calc non Af Amer: 39 mL/min/{1.73_m2} — ABNORMAL LOW (ref >59)
Glucose: 88 mg/dL (ref 65–99)
Phosphorus: 3.5 mg/dL (ref 3.0–4.3)
Potassium: 4.1 mmol/L (ref 3.5–5.2)
Sodium: 139 mmol/L (ref 134–144)

## 2019-05-01 LAB — POTASSIUM: Potassium: 4.3 mmol/L (ref 3.5–5.2)

## 2019-05-07 ENCOUNTER — Telehealth: Payer: Self-pay | Admitting: Critical Care Medicine

## 2019-05-07 ENCOUNTER — Telehealth: Payer: Self-pay | Admitting: Cardiology

## 2019-05-07 NOTE — Telephone Encounter (Signed)
The patient called in inquiring if it was safe for the patient to travel to [facility] by place to see her dying sister.  He also wanted an anxiety medication prescribed but was informed that would have to come from PCP.

## 2019-05-07 NOTE — Telephone Encounter (Signed)
I returned call to number listed for patient in phone note but voicemail is not set up. I placed call to number listed for Darryl and left message to call office

## 2019-05-07 NOTE — Telephone Encounter (Signed)
New Message     Darryl is the pts son and he is with the pt. They are calling because they have questions on the pt traveling    Please advise

## 2019-05-07 NOTE — Telephone Encounter (Signed)
SON NOTIFIED VERBALIZES UNDERSTANDING

## 2019-05-07 NOTE — Telephone Encounter (Signed)
Ok, please just write a note saying she must fly with her oxygen. Of note, it is recommended that whenever she flies she should turn her oxygen up by 2L from her baseline, which may not be available on such short notice. If she is going to potentially run out mid-flight, she should keep it at her baseline flow rate, but this is a risk she needs to know about. Her baseline oxygen flow rate may not keep her saturations in the goal range.   Julian Hy, DO 05/07/19 6:41 PM [facility]

## 2019-05-07 NOTE — Telephone Encounter (Signed)
Dr. Carlis Abbott please advise:  Patient needs an physcian's note stating she has to carry oxygen on the plane while flying. she has to fly out to [place]. it can be faxed to [phone]

## 2019-05-07 NOTE — Telephone Encounter (Signed)
Call made to patient made aware we have received her message and that I will forward her message to MD/nurse and this will be addressed next week. Voiced understanding.   Will forward to nurse.

## 2019-05-07 NOTE — Telephone Encounter (Signed)
D/w Coletta Memos, FNP-C make sure pt wears compression stockings,elevate LE, wear mask the entire trip, wash hands frequently and social distance. Ok to travel

## 2019-05-10 NOTE — Telephone Encounter (Signed)
Patient states that she no longer needs letter to fly. Her family member has passed away. Expressed to patient we were sorry for her loss and to let us know if she needed anything. Nothing further needed at this time.
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
## 2019-05-26 ENCOUNTER — Other Ambulatory Visit: Payer: Self-pay

## 2019-05-26 ENCOUNTER — Encounter: Payer: Self-pay | Admitting: Cardiology

## 2019-05-26 ENCOUNTER — Ambulatory Visit (INDEPENDENT_AMBULATORY_CARE_PROVIDER_SITE_OTHER): Payer: Medicare Other | Admitting: Cardiology

## 2019-05-26 VITALS — BP 108/62 | HR 71 | Ht 64.0 in | Wt 81.4 lb

## 2019-05-26 DIAGNOSIS — I714 Abdominal aortic aneurysm, without rupture, unspecified: Secondary | ICD-10-CM

## 2019-05-26 DIAGNOSIS — I2510 Atherosclerotic heart disease of native coronary artery without angina pectoris: Secondary | ICD-10-CM

## 2019-05-26 DIAGNOSIS — E785 Hyperlipidemia, unspecified: Secondary | ICD-10-CM

## 2019-05-26 DIAGNOSIS — I255 Ischemic cardiomyopathy: Secondary | ICD-10-CM | POA: Diagnosis not present

## 2019-05-26 MED ORDER — EZETIMIBE 10 MG PO TABS: 10.0000 mg | ORAL_TABLET | Freq: Every day | ORAL | 3 refills | Status: DC

## 2019-05-26 NOTE — Patient Instructions (Signed)
Medication Instructions:  Refill sent to the pharmacy electronically.  *If you need a refill on your cardiac medications before your next appointment, please call your pharmacy*   Lab Work: Your physician recommends that you return for lab work in: [place]  If you have labs (blood work) drawn today and your tests are completely normal, you will receive your results only by: Marland Kitchen MyChart Message (if you have MyChart) OR . A paper copy in the mail If you have any lab test that is abnormal or we need to change your treatment, we will call you to review the results.   Follow-Up: At [facility], you and your health needs are our priority.  As part of our continuing mission to provide you with exceptional heart care, we have created designated Provider Care Teams.  These Care Teams include your primary Cardiologist (physician) and Advanced Practice Providers (APPs -  Physician Assistants and Nurse Practitioners) who all work together to provide you with the care you need, when you need it.  We recommend signing up for the patient portal called "MyChart".  Sign up information is provided on this After Visit Summary.  MyChart is used to connect with patients for Virtual Visits (Telemedicine).  Patients are able to view lab/test results, encounter notes, upcoming appointments, etc.  Non-urgent messages can be sent to your provider as well.   To learn more about what you can do with MyChart, go to [email or web address].    Your next appointment:   6 month(s)  The format for your next appointment:   Either In Person or Virtual  Provider:   Kirk Ruths, MD

## 2019-06-29 ENCOUNTER — Telehealth: Payer: Self-pay | Admitting: Critical Care Medicine

## 2019-06-29 NOTE — Telephone Encounter (Signed)
Spoke with the pt and advised her of response per [facility]  She verbalized understanding  Will call if not improving

## 2019-06-29 NOTE — Telephone Encounter (Signed)
Her symptoms are consistent with allergic rhinitis. I would still recommend either an oral or nasal antihistamine. She could try taking these types of medications every other day to cut down on the side effects. There are some natural over the counter options ([employer]). She can also use ocean (saline) nasal rinse twice daily.

## 2019-06-29 NOTE — Telephone Encounter (Signed)
Spoke with patient. She stated that she has had a runny nose and eyes since last Friday. Her symptoms did not bother her until Sunday. She noticed some burning in her nostrils and eyes. The discharge has been clear, appears to be watery.   She denied having any fevers, body aches or being around anyone with COVID. Also denied any increased SOB. She is scared to take OTC allergy medication due to a reaction in the past. She can not remember the name of the medication but she stated it was OTC. The medication dried her out to the point she could not breathe and she had to go to the ER.   [facility] on [address].

## 2019-07-16 ENCOUNTER — Other Ambulatory Visit: Payer: Self-pay | Admitting: Cardiology

## 2019-07-19 ENCOUNTER — Other Ambulatory Visit: Payer: Self-pay | Admitting: Cardiovascular Disease

## 2019-07-20 ENCOUNTER — Other Ambulatory Visit: Payer: Self-pay | Admitting: Cardiology

## 2019-07-20 DIAGNOSIS — E7800 Pure hypercholesterolemia, unspecified: Secondary | ICD-10-CM

## 2019-07-20 DIAGNOSIS — G720 Drug-induced myopathy: Secondary | ICD-10-CM

## 2019-07-20 DIAGNOSIS — T466X5A Adverse effect of antihyperlipidemic and antiarteriosclerotic drugs, initial encounter: Secondary | ICD-10-CM

## 2019-07-20 DIAGNOSIS — E785 Hyperlipidemia, unspecified: Secondary | ICD-10-CM

## 2019-07-21 ENCOUNTER — Other Ambulatory Visit (HOSPITAL_BASED_OUTPATIENT_CLINIC_OR_DEPARTMENT_OTHER): Payer: Self-pay | Admitting: Internal Medicine

## 2019-07-21 DIAGNOSIS — N183 Chronic kidney disease, stage 3 unspecified: Secondary | ICD-10-CM

## 2019-07-29 ENCOUNTER — Other Ambulatory Visit: Payer: Self-pay

## 2019-07-29 ENCOUNTER — Ambulatory Visit (HOSPITAL_BASED_OUTPATIENT_CLINIC_OR_DEPARTMENT_OTHER)
Admission: RE | Admit: 2019-07-29 | Discharge: 2019-07-29 | Disposition: A | Discharge status: Home / Self Care | Payer: Medicare Other | Source: Ambulatory Visit | Attending: Internal Medicine | Admitting: Internal Medicine

## 2019-07-29 DIAGNOSIS — N183 Chronic kidney disease, stage 3 unspecified: Secondary | ICD-10-CM | POA: Diagnosis present

## 2019-08-19 ENCOUNTER — Encounter: Payer: Self-pay | Admitting: *Deleted

## 2019-08-19 LAB — HEPATIC FUNCTION PANEL
ALT: 4 IU/L (ref 0–32)
AST: 16 IU/L (ref 0–40)
Albumin: 4.1 g/dL (ref 3.7–4.7)
Alkaline Phosphatase: 18 IU/L — ABNORMAL LOW (ref 48–121)
Bilirubin Total: 0.4 mg/dL (ref 0.0–1.2)
Bilirubin, Direct: 0.11 mg/dL (ref 0.00–0.40)
Total Protein: 7.1 g/dL (ref 6.0–8.5)

## 2019-08-19 LAB — LIPID PANEL
Chol/HDL Ratio: 3.5 ratio (ref 0.0–4.4)
Cholesterol, Total: 141 mg/dL (ref 100–199)
HDL: 40 mg/dL (ref >39)
LDL Chol Calc (NIH): 84 mg/dL (ref 0–99)
Triglycerides: 91 mg/dL (ref 0–149)
VLDL Cholesterol Cal: 17 mg/dL (ref 5–40)

## 2019-09-12 ENCOUNTER — Other Ambulatory Visit: Payer: Self-pay | Admitting: Critical Care Medicine

## 2019-09-12 DIAGNOSIS — J449 Chronic obstructive pulmonary disease, unspecified: Secondary | ICD-10-CM

## 2019-09-14 ENCOUNTER — Telehealth: Payer: Self-pay | Admitting: Critical Care Medicine

## 2019-09-14 NOTE — Telephone Encounter (Signed)
error 
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
## 2019-12-13 ENCOUNTER — Other Ambulatory Visit: Payer: Self-pay | Admitting: Critical Care Medicine

## 2019-12-13 ENCOUNTER — Other Ambulatory Visit: Payer: Self-pay | Admitting: Cardiology

## 2019-12-13 DIAGNOSIS — J449 Chronic obstructive pulmonary disease, unspecified: Secondary | ICD-10-CM

## 2019-12-15 ENCOUNTER — Ambulatory Visit (INDEPENDENT_AMBULATORY_CARE_PROVIDER_SITE_OTHER): Payer: Medicare Other | Admitting: Cardiology

## 2019-12-15 ENCOUNTER — Other Ambulatory Visit: Payer: Self-pay

## 2019-12-15 ENCOUNTER — Encounter: Payer: Self-pay | Admitting: Cardiology

## 2019-12-15 VITALS — BP 124/62 | HR 78 | Ht 64.0 in | Wt 84.0 lb

## 2019-12-15 DIAGNOSIS — I2510 Atherosclerotic heart disease of native coronary artery without angina pectoris: Secondary | ICD-10-CM | POA: Diagnosis not present

## 2019-12-15 DIAGNOSIS — I255 Ischemic cardiomyopathy: Secondary | ICD-10-CM

## 2019-12-15 DIAGNOSIS — I714 Abdominal aortic aneurysm, without rupture, unspecified: Secondary | ICD-10-CM

## 2019-12-15 DIAGNOSIS — E785 Hyperlipidemia, unspecified: Secondary | ICD-10-CM

## 2019-12-15 DIAGNOSIS — I10 Essential (primary) hypertension: Secondary | ICD-10-CM

## 2019-12-15 NOTE — Patient Instructions (Signed)

## 2019-12-17 ENCOUNTER — Ambulatory Visit: Payer: Medicare Other | Attending: Internal Medicine

## 2019-12-17 ENCOUNTER — Other Ambulatory Visit (HOSPITAL_BASED_OUTPATIENT_CLINIC_OR_DEPARTMENT_OTHER): Payer: Self-pay | Admitting: Internal Medicine

## 2019-12-17 DIAGNOSIS — Z23 Encounter for immunization: Secondary | ICD-10-CM

## 2019-12-17 MED FILL — FLUAD QUADRIVALENT 0.5 ML P: 0.5 | 1 days supply | Qty: 1 | Fill #0

## 2019-12-17 NOTE — Progress Notes (Signed)
   Covid-19 Vaccination Clinic  Name:  Tonya Harper    MRN: [id number] DOB: [birth year]  12/17/2019  [name] was observed post Covid-19 immunization for 15 minutes without incident. She was provided with Vaccine Information Sheet and instruction to access the V-Safe system.   [name] was instructed to call 911 with any severe reactions post vaccine: Marland Kitchen Difficulty breathing  . Swelling of face and throat  . A fast heartbeat  . A bad rash all over body  . Dizziness and weakness   Immunizations Administered    Name Date Dose VIS Date Route   Pfizer COVID-19 Vaccine 12/17/2019 12:11 PM 0.3 mL 11/17/2019 Intramuscular   Manufacturer: [place]   Lot: [id number]   [place]: [id number]

## 2020-01-11 ENCOUNTER — Other Ambulatory Visit: Payer: Self-pay | Admitting: Cardiology

## 2020-01-11 DIAGNOSIS — E7800 Pure hypercholesterolemia, unspecified: Secondary | ICD-10-CM

## 2020-01-11 DIAGNOSIS — E785 Hyperlipidemia, unspecified: Secondary | ICD-10-CM

## 2020-01-11 DIAGNOSIS — T466X5A Adverse effect of antihyperlipidemic and antiarteriosclerotic drugs, initial encounter: Secondary | ICD-10-CM

## 2020-01-11 DIAGNOSIS — G720 Drug-induced myopathy: Secondary | ICD-10-CM

## 2020-02-15 ENCOUNTER — Telehealth: Payer: Self-pay | Admitting: Cardiology

## 2020-02-15 DIAGNOSIS — I2510 Atherosclerotic heart disease of native coronary artery without angina pectoris: Secondary | ICD-10-CM

## 2020-02-15 DIAGNOSIS — I10 Essential (primary) hypertension: Secondary | ICD-10-CM

## 2020-02-15 DIAGNOSIS — I255 Ischemic cardiomyopathy: Secondary | ICD-10-CM

## 2020-02-15 DIAGNOSIS — E785 Hyperlipidemia, unspecified: Secondary | ICD-10-CM

## 2020-02-15 NOTE — Telephone Encounter (Signed)
Cbc, bmet, lipids and liver Kirk Ruths

## 2020-02-15 NOTE — Telephone Encounter (Signed)
Orders placed for [facility] 

## 2020-02-15 NOTE — Telephone Encounter (Signed)
Returned call to patient, she states she goes to get her thyroid checked (labs drawn) on Thursday at the [facility].   She was wondering if we could send the orders for the labs Dr. Stanford Breed requested to have done.     No active orders or see that any labs are due.   OV 01/2019-DrStanford Breed noted to check hemoglobin and kidney function.  She states she got this drawn by another doctor but did not get this sent to him.       Advised if labs needed of recommended will place orders for [facility] to be completed at the same time on Thursday.  Patient aware.    Routed to MD to verify labs needed.

## 2020-02-15 NOTE — Telephone Encounter (Signed)
Patient asked that Tonya Harper put in orders for her to have labs done at the [facility]. Patient said she has to have labs done for her PCP and would just like to have all labs drawn at once.

## 2020-02-17 DIAGNOSIS — E785 Hyperlipidemia, unspecified: Secondary | ICD-10-CM | POA: Diagnosis not present

## 2020-02-17 DIAGNOSIS — M1830 Unilateral post-traumatic osteoarthritis of first carpometacarpal joint, unspecified hand: Secondary | ICD-10-CM | POA: Diagnosis not present

## 2020-02-17 DIAGNOSIS — I10 Essential (primary) hypertension: Secondary | ICD-10-CM | POA: Diagnosis not present

## 2020-02-17 DIAGNOSIS — I2510 Atherosclerotic heart disease of native coronary artery without angina pectoris: Secondary | ICD-10-CM | POA: Diagnosis not present

## 2020-02-17 DIAGNOSIS — I255 Ischemic cardiomyopathy: Secondary | ICD-10-CM | POA: Diagnosis not present

## 2020-02-17 LAB — CBC
Hematocrit: 31.7 % — ABNORMAL LOW (ref 34.0–46.6)
Hemoglobin: 10.2 g/dL — ABNORMAL LOW (ref 11.1–15.9)
MCH: 28.3 pg (ref 26.6–33.0)
MCHC: 32.2 g/dL (ref 31.5–35.7)
MCV: 88 fL (ref 79–97)
Platelets: 221 10*3/uL (ref 150–450)
RBC: 3.61 x10E6/uL — ABNORMAL LOW (ref 3.77–5.28)
RDW: 11.7 % (ref 11.7–15.4)
WBC: 9.2 10*3/uL (ref 3.4–10.8)

## 2020-02-18 LAB — LIPID PANEL
Chol/HDL Ratio: 2.8 ratio (ref 0.0–4.4)
Cholesterol, Total: 149 mg/dL (ref 100–199)
HDL: 54 mg/dL (ref >39)
LDL Chol Calc (NIH): 81 mg/dL (ref 0–99)
Triglycerides: 71 mg/dL (ref 0–149)
VLDL Cholesterol Cal: 14 mg/dL (ref 5–40)

## 2020-02-18 LAB — HEPATIC FUNCTION PANEL
ALT: 5 IU/L (ref 0–32)
AST: 17 IU/L (ref 0–40)
Albumin: 4.1 g/dL (ref 3.7–4.7)
Alkaline Phosphatase: 21 IU/L — ABNORMAL LOW (ref 44–121)
Bilirubin Total: 0.5 mg/dL (ref 0.0–1.2)
Bilirubin, Direct: 0.13 mg/dL (ref 0.00–0.40)
Total Protein: 7.4 g/dL (ref 6.0–8.5)

## 2020-02-18 LAB — BASIC METABOLIC PANEL
BUN/Creatinine Ratio: 11 — ABNORMAL LOW (ref 12–28)
BUN: 12 mg/dL (ref 8–27)
CO2: 28 mmol/L (ref 20–29)
Calcium: 10.1 mg/dL (ref 8.7–10.3)
Chloride: 101 mmol/L (ref 96–106)
Creatinine, Ser: 1.07 mg/dL — ABNORMAL HIGH (ref 0.57–1.00)
GFR calc Af Amer: 60 mL/min/{1.73_m2} (ref >59)
GFR calc non Af Amer: 52 mL/min/{1.73_m2} — ABNORMAL LOW (ref >59)
Glucose: 75 mg/dL (ref 65–99)
Potassium: 4.6 mmol/L (ref 3.5–5.2)
Sodium: 141 mmol/L (ref 134–144)

## 2020-02-21 DIAGNOSIS — N183 Chronic kidney disease, stage 3 unspecified: Secondary | ICD-10-CM | POA: Diagnosis not present

## 2020-02-21 DIAGNOSIS — I10 Essential (primary) hypertension: Secondary | ICD-10-CM | POA: Diagnosis not present

## 2020-02-21 DIAGNOSIS — N390 Urinary tract infection, site not specified: Secondary | ICD-10-CM | POA: Diagnosis not present

## 2020-03-06 DIAGNOSIS — Z0000 Encounter for general adult medical examination without abnormal findings: Secondary | ICD-10-CM | POA: Diagnosis not present

## 2020-03-06 DIAGNOSIS — E042 Nontoxic multinodular goiter: Secondary | ICD-10-CM | POA: Diagnosis not present

## 2020-03-06 DIAGNOSIS — I10 Essential (primary) hypertension: Secondary | ICD-10-CM | POA: Diagnosis not present

## 2020-03-06 DIAGNOSIS — E46 Unspecified protein-calorie malnutrition: Secondary | ICD-10-CM | POA: Diagnosis not present

## 2020-03-06 DIAGNOSIS — Z1389 Encounter for screening for other disorder: Secondary | ICD-10-CM | POA: Diagnosis not present

## 2020-03-06 DIAGNOSIS — I5022 Chronic systolic (congestive) heart failure: Secondary | ICD-10-CM | POA: Diagnosis not present

## 2020-03-06 DIAGNOSIS — E559 Vitamin D deficiency, unspecified: Secondary | ICD-10-CM | POA: Diagnosis not present

## 2020-03-06 DIAGNOSIS — I255 Ischemic cardiomyopathy: Secondary | ICD-10-CM | POA: Diagnosis not present

## 2020-03-06 DIAGNOSIS — I739 Peripheral vascular disease, unspecified: Secondary | ICD-10-CM | POA: Diagnosis not present

## 2020-03-06 DIAGNOSIS — J441 Chronic obstructive pulmonary disease with (acute) exacerbation: Secondary | ICD-10-CM | POA: Diagnosis not present

## 2020-03-06 DIAGNOSIS — E890 Postprocedural hypothyroidism: Secondary | ICD-10-CM | POA: Diagnosis not present

## 2020-03-06 DIAGNOSIS — I714 Abdominal aortic aneurysm, without rupture: Secondary | ICD-10-CM | POA: Diagnosis not present

## 2020-03-09 ENCOUNTER — Other Ambulatory Visit: Payer: Self-pay | Admitting: Cardiology

## 2020-03-12 ENCOUNTER — Other Ambulatory Visit: Payer: Self-pay | Admitting: Critical Care Medicine

## 2020-03-12 DIAGNOSIS — J449 Chronic obstructive pulmonary disease, unspecified: Secondary | ICD-10-CM

## 2020-03-14 ENCOUNTER — Encounter: Payer: Self-pay | Admitting: Adult Health

## 2020-03-14 ENCOUNTER — Other Ambulatory Visit: Payer: Self-pay

## 2020-03-14 ENCOUNTER — Ambulatory Visit (INDEPENDENT_AMBULATORY_CARE_PROVIDER_SITE_OTHER): Payer: Medicare Other | Admitting: Adult Health

## 2020-03-14 VITALS — BP 114/58 | HR 82 | Temp 98.1°F | Ht 64.0 in | Wt 82.2 lb

## 2020-03-14 DIAGNOSIS — J449 Chronic obstructive pulmonary disease, unspecified: Secondary | ICD-10-CM | POA: Diagnosis not present

## 2020-03-14 DIAGNOSIS — I255 Ischemic cardiomyopathy: Secondary | ICD-10-CM

## 2020-03-14 DIAGNOSIS — J9611 Chronic respiratory failure with hypoxia: Secondary | ICD-10-CM

## 2020-03-14 MED ORDER — TRELEGY ELLIPTA 100-62.5-25 MCG/INH IN AEPB: INHALATION_SPRAY | RESPIRATORY_TRACT | 6 refills | Status: DC

## 2020-03-14 MED ORDER — TRELEGY ELLIPTA 100-62.5-25 MCG/INH IN AEPB: 1.0000 | INHALATION_SPRAY | Freq: Every day | RESPIRATORY_TRACT | 0 refills | Status: DC

## 2020-03-14 MED ORDER — PREDNISONE 10 MG PO TABS: ORAL_TABLET | ORAL | 0 refills | Status: DC

## 2020-03-14 MED ORDER — AMOXICILLIN-POT CLAVULANATE 875-125 MG PO TABS: 1.0000 | ORAL_TABLET | Freq: Two times a day (BID) | ORAL | 0 refills | Status: AC

## 2020-03-14 NOTE — Patient Instructions (Addendum)
Covid 19 testing . Call if positive .  Augmentin 875mg  Twice daily  For 1 week, take with food.  Prednisone taper over next week.  Restart TRELEGY 1 puff daily , rinse after use.  Albuterol inhaler or Neb as needed.  Order for new neb machine .  Mucinex DM Twice daily  As needed  Cough/congestion .  Add ensure/protein drink Twice daily  Between meals.  Continue on oxygen 2 l/m with activity and At bedtime  .  Follow up with Dr. Silas Flood or Tejas Seawood NP in 6-8 weeks with chest xray .  Please contact office for sooner follow up if symptoms do not improve or worsen or seek emergency care

## 2020-03-14 NOTE — Assessment & Plan Note (Signed)
Continue on oxygen 2 L with activity and at bedtime 
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
## 2020-03-14 NOTE — Assessment & Plan Note (Signed)
Slow to resolve COPD exacerbation.  Patient needs COVID-19 testing.  Would like to get a chest x-ray today however patient declines.  Says that she will do it on return visit.  Plan  Patient Instructions  Covid 19 testing . Call if positive .  Augmentin 875mg  Twice daily  For 1 week, take with food.  Prednisone taper over next week.  Restart TRELEGY 1 puff daily , rinse after use.  Albuterol inhaler or Neb as needed.  Order for new neb machine .  Mucinex DM Twice daily  As needed  Cough/congestion .  Add ensure/protein drink Twice daily  Between meals.  Continue on oxygen 2 l/m with activity and At bedtime  .  Follow up with Dr. Silas Flood or Arafat Cocuzza NP in 6-8 weeks with chest xray .  Please contact office for sooner follow up if symptoms do not improve or worsen or seek emergency care

## 2020-03-16 ENCOUNTER — Telehealth: Payer: Self-pay | Admitting: Adult Health

## 2020-03-16 NOTE — Telephone Encounter (Addendum)
Noted. Will forward to TP as FYI to inform that pt's COVID test resulted negative.

## 2020-03-20 NOTE — Telephone Encounter (Signed)
Noted. Message has been route to TP.   Nothing further needed at this time.

## 2020-03-31 DIAGNOSIS — L080 Pyoderma: Secondary | ICD-10-CM | POA: Diagnosis not present

## 2020-03-31 DIAGNOSIS — L308 Other specified dermatitis: Secondary | ICD-10-CM | POA: Diagnosis not present

## 2020-04-05 ENCOUNTER — Other Ambulatory Visit: Payer: Self-pay

## 2020-04-05 ENCOUNTER — Telehealth: Payer: Self-pay | Admitting: Cardiology

## 2020-04-05 MED ORDER — NITROGLYCERIN 0.4 MG SL SUBL: 0.4000 mg | SUBLINGUAL_TABLET | SUBLINGUAL | 3 refills | Status: DC | PRN

## 2020-04-05 NOTE — Telephone Encounter (Signed)
*  STAT* If patient is at the pharmacy, call can be transferred to refill team.   1. Which medications need to be refilled? (please list name of each medication and dose if known)  nitroGLYCERIN (NITROSTAT) 0.4 MG SL tablet  2. Which pharmacy/location (including street and city if local pharmacy) is medication to be sent to?  [id number] - [place], [place] - [address] AT [address]  3. Do they need a 30 day or 90 day supply? [place]

## 2020-04-06 DIAGNOSIS — L080 Pyoderma: Secondary | ICD-10-CM | POA: Diagnosis not present

## 2020-04-06 DIAGNOSIS — Z79899 Other long term (current) drug therapy: Secondary | ICD-10-CM | POA: Diagnosis not present

## 2020-04-13 DIAGNOSIS — Z1231 Encounter for screening mammogram for malignant neoplasm of breast: Secondary | ICD-10-CM | POA: Diagnosis not present

## 2020-04-19 ENCOUNTER — Other Ambulatory Visit: Payer: Self-pay | Admitting: Cardiology

## 2020-04-25 ENCOUNTER — Encounter: Payer: Self-pay | Admitting: Primary Care

## 2020-04-25 ENCOUNTER — Ambulatory Visit (INDEPENDENT_AMBULATORY_CARE_PROVIDER_SITE_OTHER): Payer: Medicare Other | Admitting: Primary Care

## 2020-04-25 ENCOUNTER — Ambulatory Visit (INDEPENDENT_AMBULATORY_CARE_PROVIDER_SITE_OTHER): Payer: Medicare Other

## 2020-04-25 ENCOUNTER — Ambulatory Visit: Payer: Medicare Other | Admitting: Adult Health

## 2020-04-25 ENCOUNTER — Other Ambulatory Visit: Payer: Self-pay

## 2020-04-25 VITALS — BP 130/68 | HR 65 | Temp 97.8°F | Ht 64.0 in | Wt 83.4 lb

## 2020-04-25 DIAGNOSIS — J449 Chronic obstructive pulmonary disease, unspecified: Secondary | ICD-10-CM

## 2020-04-25 DIAGNOSIS — I255 Ischemic cardiomyopathy: Secondary | ICD-10-CM | POA: Diagnosis not present

## 2020-04-25 DIAGNOSIS — D508 Other iron deficiency anemias: Secondary | ICD-10-CM | POA: Diagnosis not present

## 2020-04-25 DIAGNOSIS — Z87891 Personal history of nicotine dependence: Secondary | ICD-10-CM

## 2020-04-25 DIAGNOSIS — J439 Emphysema, unspecified: Secondary | ICD-10-CM | POA: Diagnosis not present

## 2020-04-25 MED ORDER — TRELEGY ELLIPTA 100-62.5-25 MCG/INH IN AEPB: 1.0000 | INHALATION_SPRAY | Freq: Every day | RESPIRATORY_TRACT | 0 refills | Status: DC
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
## 2020-04-25 NOTE — Assessment & Plan Note (Addendum)
-   Patient reports fatigue, Hgb 10.1 - She has an apt tomorrow with her PCP/ Dr. Fara Olden

## 2020-04-25 NOTE — Assessment & Plan Note (Addendum)
-   COPD GOLD C. Spirometry last done in 2013, FEV 38%. She has no acute COPD symptoms today. Chronic dyspnea on exertion. Denies cough or mucus production. CAT 17.  - CXR today showed hyperinflated lungs with emphysematous changes. No acute findings.  - Continue Trelegy 100, one puff daily (rinse mouth after use) - Recommending pulmonary rehab, patient will consider  - Needs repeat PFTs  - FU in 3 months with new [facility] pulmonary MD

## 2020-04-25 NOTE — Assessment & Plan Note (Signed)
-   Patient quit in 2013, 20 pack year hx - Discussed referral to lung cancer screening program, patient will consider

## 2020-04-25 NOTE — Patient Instructions (Addendum)
CXR today showed hyperinflation with emphysematous changes.  No acute findings  Recommendations: - Continue Trelegy 100, take one puff daily in the morning (rinse mouth after use) - Use Albuterol inhaler/nebulizer every 6 hours as needed for breakthrough shortness of breath/wheezing - Consider lung cancer screening with low dose CT annually - Consider pulmonary rehab   Orders: - Needs repeat PFTs in 2-3 months (ordered)  Follow-up - 2-3 months with either Dr. Erin Fulling or Dr. Silas Flood   COPD and Physical Activity Chronic obstructive pulmonary disease (COPD) is a long-term (chronic) condition that affects the lungs. COPD is a general term that can be used to describe many different lung problems that cause lung swelling (inflammation) and limit airflow, including chronic bronchitis and emphysema. The main symptom of COPD is shortness of breath, which makes it harder to do even simple tasks. This can also make it harder to exercise and be active. Talk with your health care provider about treatments to help you breathe better and actions you can take to prevent breathing problems during physical activity. What are the benefits of exercising with COPD? Exercising regularly is an important part of a healthy lifestyle. You can still exercise and do physical activities even though you have COPD. Exercise and physical activity improve your shortness of breath by increasing blood flow (circulation). This causes your heart to pump more oxygen through your body. Moderate exercise can improve your:  Oxygen use.  Energy level.  Shortness of breath.  Strength in your breathing muscles.  Heart health.  Sleep.  Self-esteem and feelings of self-worth.  Depression, stress, and anxiety levels. Exercise can benefit everyone with COPD. The severity of your disease may affect how hard you can exercise, especially at first, but everyone can benefit. Talk with your health care provider about how much  exercise is safe for you, and which activities and exercises are safe for you.   What actions can I take to prevent breathing problems during physical activity?  Sign up for a pulmonary rehabilitation program. This type of program may include: ? Education about lung diseases. ? Exercise classes that teach you how to exercise and be more active while improving your breathing. This usually involves:  Exercise using your lower extremities, such as a stationary bicycle.  About 30 minutes of exercise, 2 to 5 times per week, for 6 to 12 weeks  Strength training, such as push ups or leg lifts. ? Nutrition education. ? Group classes in which you can talk with others who also have COPD and learn ways to manage stress.  If you use an oxygen tank, you should use it while you exercise. Work with your health care provider to adjust your oxygen for your physical activity. Your resting flow rate is different from your flow rate during physical activity.  While you are exercising: ? Take slow breaths. ? Pace yourself and do not try to go too fast. ? Purse your lips while breathing out. Pursing your lips is similar to a kissing or whistling position. ? If doing exercise that uses a quick burst of effort, such as weight lifting:  Breathe in before starting the exercise.  Breathe out during the hardest part of the exercise (such as raising the weights). Where to find support You can find support for exercising with COPD from:  Your health care provider.  A pulmonary rehabilitation program.  Your local health department or community health programs.  Support groups, online or in-person. Your health care provider may be able to  recommend support groups. Where to find more information You can find more information about exercising with COPD from:  American Lung Association: ClassInsider.se.  COPD Foundation: https://www.rivera.net/. Contact a health care provider if:  Your symptoms get worse.  You have  chest pain.  You have nausea.  You have a fever.  You have trouble talking or catching your breath.  You want to start a new exercise program or a new activity. Summary  COPD is a general term that can be used to describe many different lung problems that cause lung swelling (inflammation) and limit airflow. This includes chronic bronchitis and emphysema.  Exercise and physical activity improve your shortness of breath by increasing blood flow (circulation). This causes your heart to provide more oxygen to your body.  Contact your health care provider before starting any exercise program or new activity. Ask your health care provider what exercises and activities are safe for you. This information is not intended to replace advice given to you by your health care provider. Make sure you discuss any questions you have with your health care provider. Document Revised: 05/06/2018 Document Reviewed: 02/06/2017 Elsevier Patient Education  2021 [employer].

## 2020-04-26 DIAGNOSIS — J449 Chronic obstructive pulmonary disease, unspecified: Secondary | ICD-10-CM | POA: Diagnosis not present

## 2020-04-26 DIAGNOSIS — R0902 Hypoxemia: Secondary | ICD-10-CM | POA: Diagnosis not present

## 2020-04-26 DIAGNOSIS — D6489 Other specified anemias: Secondary | ICD-10-CM | POA: Diagnosis not present

## 2020-04-26 DIAGNOSIS — D649 Anemia, unspecified: Secondary | ICD-10-CM | POA: Diagnosis not present

## 2020-04-26 DIAGNOSIS — D638 Anemia in other chronic diseases classified elsewhere: Secondary | ICD-10-CM | POA: Diagnosis not present

## 2020-04-28 DIAGNOSIS — H01005 Unspecified blepharitis left lower eyelid: Secondary | ICD-10-CM | POA: Diagnosis not present

## 2020-04-28 DIAGNOSIS — H01001 Unspecified blepharitis right upper eyelid: Secondary | ICD-10-CM | POA: Diagnosis not present

## 2020-04-28 DIAGNOSIS — H01002 Unspecified blepharitis right lower eyelid: Secondary | ICD-10-CM | POA: Diagnosis not present

## 2020-04-28 DIAGNOSIS — H01004 Unspecified blepharitis left upper eyelid: Secondary | ICD-10-CM | POA: Diagnosis not present

## 2020-05-01 ENCOUNTER — Other Ambulatory Visit: Payer: Self-pay

## 2020-05-01 ENCOUNTER — Emergency Department (HOSPITAL_BASED_OUTPATIENT_CLINIC_OR_DEPARTMENT_OTHER)
Admission: EM | Admit: 2020-05-01 | Discharge: 2020-05-02 | Disposition: A | Discharge status: Home / Self Care | Payer: Medicare Other | Attending: Emergency Medicine | Admitting: Emergency Medicine

## 2020-05-01 ENCOUNTER — Encounter (HOSPITAL_BASED_OUTPATIENT_CLINIC_OR_DEPARTMENT_OTHER): Payer: Self-pay | Admitting: *Deleted

## 2020-05-01 DIAGNOSIS — E039 Hypothyroidism, unspecified: Secondary | ICD-10-CM | POA: Insufficient documentation

## 2020-05-01 DIAGNOSIS — Z79899 Other long term (current) drug therapy: Secondary | ICD-10-CM | POA: Insufficient documentation

## 2020-05-01 DIAGNOSIS — Z87891 Personal history of nicotine dependence: Secondary | ICD-10-CM | POA: Diagnosis not present

## 2020-05-01 DIAGNOSIS — I110 Hypertensive heart disease with heart failure: Secondary | ICD-10-CM | POA: Insufficient documentation

## 2020-05-01 DIAGNOSIS — R109 Unspecified abdominal pain: Secondary | ICD-10-CM | POA: Diagnosis not present

## 2020-05-01 DIAGNOSIS — S32000A Wedge compression fracture of unspecified lumbar vertebra, initial encounter for closed fracture: Secondary | ICD-10-CM

## 2020-05-01 DIAGNOSIS — I2510 Atherosclerotic heart disease of native coronary artery without angina pectoris: Secondary | ICD-10-CM | POA: Insufficient documentation

## 2020-05-01 DIAGNOSIS — S3992XA Unspecified injury of lower back, initial encounter: Secondary | ICD-10-CM | POA: Diagnosis present

## 2020-05-01 DIAGNOSIS — X509XXA Other and unspecified overexertion or strenuous movements or postures, initial encounter: Secondary | ICD-10-CM | POA: Insufficient documentation

## 2020-05-01 DIAGNOSIS — Z955 Presence of coronary angioplasty implant and graft: Secondary | ICD-10-CM | POA: Insufficient documentation

## 2020-05-01 DIAGNOSIS — J441 Chronic obstructive pulmonary disease with (acute) exacerbation: Secondary | ICD-10-CM | POA: Diagnosis not present

## 2020-05-01 DIAGNOSIS — I5022 Chronic systolic (congestive) heart failure: Secondary | ICD-10-CM | POA: Insufficient documentation

## 2020-05-01 DIAGNOSIS — Z7951 Long term (current) use of inhaled steroids: Secondary | ICD-10-CM | POA: Insufficient documentation

## 2020-05-01 DIAGNOSIS — Z7901 Long term (current) use of anticoagulants: Secondary | ICD-10-CM | POA: Insufficient documentation

## 2020-05-01 DIAGNOSIS — I10 Essential (primary) hypertension: Secondary | ICD-10-CM | POA: Diagnosis not present

## 2020-05-01 DIAGNOSIS — S32010A Wedge compression fracture of first lumbar vertebra, initial encounter for closed fracture: Secondary | ICD-10-CM | POA: Insufficient documentation

## 2020-05-01 DIAGNOSIS — R079 Chest pain, unspecified: Secondary | ICD-10-CM | POA: Diagnosis not present

## 2020-05-01 LAB — CBC WITH DIFFERENTIAL/PLATELET
Abs Immature Granulocytes: 0.03 10*3/uL (ref 0.00–0.07)
Basophils Absolute: 0 10*3/uL (ref 0.0–0.1)
Basophils Relative: 0 %
Eosinophils Absolute: 0.2 10*3/uL (ref 0.0–0.5)
Eosinophils Relative: 2 %
HCT: 30.4 % — ABNORMAL LOW (ref 36.0–46.0)
Hemoglobin: 9.5 g/dL — ABNORMAL LOW (ref 12.0–15.0)
Immature Granulocytes: 0 %
Lymphocytes Relative: 35 %
Lymphs Abs: 3.4 10*3/uL (ref 0.7–4.0)
MCH: 28.8 pg (ref 26.0–34.0)
MCHC: 31.3 g/dL (ref 30.0–36.0)
MCV: 92.1 fL (ref 80.0–100.0)
Monocytes Absolute: 0.8 10*3/uL (ref 0.1–1.0)
Monocytes Relative: 8 %
Neutro Abs: 5.1 10*3/uL (ref 1.7–7.7)
Neutrophils Relative %: 55 %
Platelets: 199 10*3/uL (ref 150–400)
RBC: 3.3 MIL/uL — ABNORMAL LOW (ref 3.87–5.11)
RDW: 13.2 % (ref 11.5–15.5)
WBC: 9.6 10*3/uL (ref 4.0–10.5)
nRBC: 0 % (ref 0.0–0.2)

## 2020-05-01 LAB — COMPREHENSIVE METABOLIC PANEL
ALT: 8 U/L (ref 0–44)
AST: 14 U/L — ABNORMAL LOW (ref 15–41)
Albumin: 3.9 g/dL (ref 3.5–5.0)
Alkaline Phosphatase: 23 U/L — ABNORMAL LOW (ref 38–126)
Anion gap: 8 (ref 5–15)
BUN: 23 mg/dL (ref 8–23)
CO2: 29 mmol/L (ref 22–32)
Calcium: 9.6 mg/dL (ref 8.9–10.3)
Chloride: 100 mmol/L (ref 98–111)
Creatinine, Ser: 1.06 mg/dL — ABNORMAL HIGH (ref 0.44–1.00)
GFR, Estimated: 55 mL/min — ABNORMAL LOW (ref >60)
Glucose, Bld: 92 mg/dL (ref 70–99)
Potassium: 4.4 mmol/L (ref 3.5–5.1)
Sodium: 137 mmol/L (ref 135–145)
Total Bilirubin: 0.5 mg/dL (ref 0.3–1.2)
Total Protein: 7.5 g/dL (ref 6.5–8.1)

## 2020-05-01 LAB — LIPASE, BLOOD: Lipase: 39 U/L (ref 11–51)

## 2020-05-01 NOTE — ED Notes (Signed)
PO 87% noted. She is on home oxygen at 2l/m [place] at home 90 % of the time. She did not bring her oxygen. resp placed pt on oxygen 2l/ m [place].

## 2020-05-01 NOTE — ED Notes (Signed)
Patient moved to a room and placed on 2L [place]. Patient oxygen saturation is 96%.

## 2020-05-01 NOTE — ED Triage Notes (Signed)
Emergency Medicine Provider Triage Evaluation Note  Tonya Harper , a 75 y.o. female  was evaluated in triage.  Pt complains of right sided low back radiates to abdomen after bending over to pick up a box earlier today. Pain radiates to right hip. She has tried tylenol with moderate relief.  Patient denies saddle paresthesias, bowel/bladder incontinence, lower extremity numbness/tingling, lower extremity weakness.    Review of Systems  Positive: Low back pain, abdominal pain Negative: fever  Physical Exam  BP 125/63 (BP Location: Left Arm)   Pulse (!) 108   Temp 98.5 F (36.9 C) (Oral)   Resp 16   Ht 5\' 4"  (1.626 m)   Wt 38.1 kg   SpO2 (!) 87%   BMI 14.42 kg/m  Gen:   Awake, no distress   HEENT:  Atraumatic Resp:  Normal effort Cardiac:  Normal rate Abd:   Nondistended, tenderness throughout right side of abdomen MSK:   Moves extremities without difficulty. Reproducible right lumbar paraspinal tenderness Neuro:  Speech clear   Medical Decision Making  Medically screening exam initiated at 8:49 PM.  Appropriate orders placed.  Suraiya Dickerson was informed that the remainder of the evaluation will be completed by another provider, this initial triage assessment does not replace that evaluation, and the importance of remaining in the ED until their evaluation is complete.  Clinical Impression  Suspect muscle strain; however, routine labs ordered due to abdominal pain.    Suzy Bouchard, [place] 05/01/20 2112

## 2020-05-01 NOTE — ED Notes (Signed)
Pt moving left arm while monitor was checking BP

## 2020-05-01 NOTE — ED Triage Notes (Signed)
Back pain. States she pulled a muscle this am while lifting a heavy box.

## 2020-05-01 NOTE — ED Notes (Signed)
Patient placed on 2L/[place] 'E' cylinder in lobby. Patient uses oxygen at home but did not bring to ED. Patient room air oxygen saturations were 87%. Patient oxygen saturations increased to 93%. Will continue to monitor patient and oxygen usage.

## 2020-05-02 ENCOUNTER — Emergency Department (HOSPITAL_BASED_OUTPATIENT_CLINIC_OR_DEPARTMENT_OTHER): Payer: Medicare Other

## 2020-05-02 DIAGNOSIS — S32010A Wedge compression fracture of first lumbar vertebra, initial encounter for closed fracture: Secondary | ICD-10-CM | POA: Diagnosis not present

## 2020-05-02 DIAGNOSIS — R079 Chest pain, unspecified: Secondary | ICD-10-CM | POA: Diagnosis not present

## 2020-05-02 LAB — TROPONIN I (HIGH SENSITIVITY)
Troponin I (High Sensitivity): 12 ng/L (ref <18)
Troponin I (High Sensitivity): 12 ng/L (ref <18)

## 2020-05-02 MED ORDER — IOHEXOL 350 MG/ML SOLN
100.0000 mL | Freq: Once | INTRAVENOUS | Status: AC | PRN
  Administered 2020-05-02: 100 mL via INTRAVENOUS

## 2020-05-02 MED ORDER — OXYCODONE-ACETAMINOPHEN 5-325 MG PO TABS
1.0000 | ORAL_TABLET | Freq: Once | ORAL | Status: AC
Start: 2020-05-02 — End: 2020-05-02
  Administered 2020-05-02: 1 via ORAL
  Filled 2020-05-02: qty 1

## 2020-05-02 MED ORDER — OXYCODONE-ACETAMINOPHEN 5-325 MG PO TABS: 1.0000 | ORAL_TABLET | ORAL | 0 refills | Status: DC | PRN

## 2020-05-02 MED ORDER — FENTANYL CITRATE (PF) 100 MCG/2ML IJ SOLN
50.0000 ug | Freq: Once | INTRAMUSCULAR | Status: AC
  Administered 2020-05-02: 50 ug via INTRAVENOUS
  Filled 2020-05-02: qty 2

## 2020-05-02 NOTE — ED Notes (Signed)
Patient transported to CT 
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
## 2020-05-02 NOTE — Discharge Instructions (Addendum)
Take the pain medication as prescribed.  Follow-up with your primary doctor.  Wear the back brace as prescribed.  Return to the ED with worsening pain, weakness, numbness, tingling, other concerns.

## 2020-05-02 NOTE — ED Notes (Signed)
EKG given to Ezequiel Essex MD.

## 2020-05-02 NOTE — Progress Notes (Signed)
Orthopedic Tech Progress Note Patient Details:  [name] Apr 09, 1945 [id number]  Patient ID: Tonya Harper, female   DOB: 06/30/1945, 75 y.o.   MRN: [id number] Called order into [facility]  Tonya Harper 05/02/2020, 1:49 AM

## 2020-05-04 DIAGNOSIS — S32000A Wedge compression fracture of unspecified lumbar vertebra, initial encounter for closed fracture: Secondary | ICD-10-CM | POA: Diagnosis not present

## 2020-05-04 DIAGNOSIS — E559 Vitamin D deficiency, unspecified: Secondary | ICD-10-CM | POA: Diagnosis not present

## 2020-05-04 DIAGNOSIS — M810 Age-related osteoporosis without current pathological fracture: Secondary | ICD-10-CM | POA: Diagnosis not present

## 2020-05-04 DIAGNOSIS — R5383 Other fatigue: Secondary | ICD-10-CM | POA: Diagnosis not present

## 2020-05-09 ENCOUNTER — Telehealth: Payer: Self-pay | Admitting: Cardiology

## 2020-05-09 NOTE — Telephone Encounter (Signed)
Patient called in to say that she had a CT scan done on last Monday 4/4 she wanted to know would that [place] but sufficient know for Dr. Stanford Breed to view

## 2020-05-09 NOTE — Telephone Encounter (Signed)
Spoke to patient she stated she went to [facility] ED last Monday with back pain.She had a chest ct and was told everything ok.She wanted Dr.Crenshaw to review CT.She is due for a chest ct with our office to check her aneurysm.She wants to know if she needs to have done.Dr.Crenshaw out of office this week Advised I will send message to him for advice.

## 2020-05-10 NOTE — Telephone Encounter (Signed)
AAA unchanged in size; will need fu CTA 4/23 but not now [place]

## 2020-05-16 NOTE — Telephone Encounter (Signed)
Spoke with Tonya Harper, Aware of dr crenshaw's recommendations.  °

## 2020-05-24 ENCOUNTER — Other Ambulatory Visit (HOSPITAL_COMMUNITY): Payer: Self-pay

## 2020-05-24 NOTE — Discharge Instructions (Signed)
Zoledronic Acid Injection (Paget's Disease, Osteoporosis) What is this medicine? ZOLEDRONIC ACID (ZOE le dron ik AS id) slows calcium loss from bones. It treats Paget's disease and osteoporosis. It may be used in other people at risk for bone loss. This medicine may be used for other purposes; ask your health care provider or pharmacist if you have questions. COMMON BRAND NAME(S): Reclast, Zometa What should I tell my health care provider before I take this medicine? They need to know if you have any of these conditions:  bleeding disorder  cancer  dental disease  kidney disease  low levels of calcium in the blood  low red blood cell counts  lung or breathing disease (asthma)  receiving steroids like dexamethasone or prednisone  an unusual or allergic reaction to zoledronic acid, other medicines, foods, dyes, or preservatives  pregnant or trying to get pregnant  breast-feeding How should I use this medicine? This drug is injected into a vein. It is given by a health care provider in a hospital or clinic setting. A special MedGuide will be given to you before each treatment. Be sure to read this information carefully each time. Talk to your health care provider about the use of this drug in children. Special care may be needed. Overdosage: If you think you have taken too much of this medicine contact a poison control center or emergency room at once. NOTE: This medicine is only for you. Do not share this medicine with others. What if I miss a dose? Keep appointments for follow-up doses. It is important not to miss your dose. Call your health care provider if you are unable to keep an appointment. What may interact with this medicine?  certain antibiotics given by injection  NSAIDs, medicines for pain and inflammation, like ibuprofen or naproxen  some diuretics like bumetanide, furosemide  teriparatide This list may not describe all possible interactions. Give your health  care provider a list of all the medicines, herbs, non-prescription drugs, or dietary supplements you use. Also tell them if you smoke, drink alcohol, or use illegal drugs. Some items may interact with your medicine. What should I watch for while using this medicine? Visit your health care provider for regular checks on your progress. It may be some time before you see the benefit from this drug. Some people who take this drug have severe bone, joint, or muscle pain. This drug may also increase your risk for jaw problems or a broken thigh bone. Tell your health care provider right away if you have severe pain in your jaw, bones, joints, or muscles. Tell you health care provider if you have any pain that does not go away or that gets worse. You should make sure you get enough calcium and vitamin D while you are taking this drug. Discuss the foods you eat and the vitamins you take with your health care provider. You may need blood work done while you are taking this drug. Tell your dentist and dental surgeon that you are taking this drug. You should not have major dental surgery while on this drug. See your dentist to have a dental exam and fix any dental problems before starting this drug. Take good care of your teeth while on this drug. Make sure you see your dentist for regular follow-up appointments. What side effects may I notice from receiving this medicine? Side effects that you should report to your doctor or health care provider as soon as possible:  allergic reactions (skin rash, itching or   hives; swelling of the face, lips, or tongue)  bone pain  infection (fever, chills, cough, sore throat, pain or trouble passing urine)  jaw pain, especially after dental work  joint pain  kidney injury (trouble passing urine or change in the amount of urine)  low calcium levels (fast heartbeat; muscle cramps or pain; pain, tingling, or numbness in the hands or feet; seizures)  low red blood cell  counts (trouble breathing; feeling faint; lightheaded, falls; unusually weak or tired)  muscle pain  palpitations  redness, blistering, peeling, or loosening of the skin, including inside the mouth Side effects that usually do not require medical attention (report to your doctor or health care provider if they continue or are bothersome):  diarrhea  eye irritation, itching, or pain  fever  general ill feeling or flu-like symptoms  headache  increase in blood pressure  nausea  pain, redness, or irritation at site where injected  stomach pain  upset stomach This list may not describe all possible side effects. Call your doctor for medical advice about side effects. You may report side effects to FDA at 1-800-FDA-1088. Where should I keep my medicine? This drug is given in a hospital or clinic. It will not be stored at home. NOTE: This sheet is a summary. It may not cover all possible information. If you have questions about this medicine, talk to your doctor, pharmacist, or health care provider.  2021 Elsevier/Gold Standard (2018-11-02 11:46:18)   

## 2020-05-25 ENCOUNTER — Other Ambulatory Visit: Payer: Self-pay

## 2020-05-25 ENCOUNTER — Ambulatory Visit (HOSPITAL_COMMUNITY)
Admission: RE | Admit: 2020-05-25 | Discharge: 2020-05-25 | Disposition: A | Discharge status: Home / Self Care | Payer: Medicare Other | Source: Ambulatory Visit | Attending: Sports Medicine | Admitting: Sports Medicine

## 2020-05-25 DIAGNOSIS — M810 Age-related osteoporosis without current pathological fracture: Secondary | ICD-10-CM | POA: Diagnosis not present

## 2020-05-25 MED ORDER — ZOLEDRONIC ACID 5 MG/100ML IV SOLN: 5.0000 mg | Freq: Once | INTRAVENOUS | Status: AC

## 2020-05-25 MED ORDER — ZOLEDRONIC ACID 5 MG/100ML IV SOLN
INTRAVENOUS | Status: AC
  Administered 2020-05-25: 5 mg via INTRAVENOUS
  Filled 2020-05-25: qty 100

## 2020-05-30 DIAGNOSIS — E559 Vitamin D deficiency, unspecified: Secondary | ICD-10-CM | POA: Diagnosis not present

## 2020-05-30 DIAGNOSIS — S32000D Wedge compression fracture of unspecified lumbar vertebra, subsequent encounter for fracture with routine healing: Secondary | ICD-10-CM | POA: Diagnosis not present

## 2020-05-30 DIAGNOSIS — M810 Age-related osteoporosis without current pathological fracture: Secondary | ICD-10-CM | POA: Diagnosis not present

## 2020-06-06 ENCOUNTER — Telehealth: Payer: Self-pay | Admitting: Cardiology

## 2020-06-06 NOTE — Telephone Encounter (Signed)
Returned the call to the patient. She stated that she has been using the $10 Copay card for the [facility] but it has expired. A new one has been mailed to her per her request.

## 2020-06-06 NOTE — Telephone Encounter (Signed)
Pt would like another Free [place] pay Card for [employer]. Please advise

## 2020-06-13 ENCOUNTER — Telehealth: Payer: Self-pay | Admitting: *Deleted

## 2020-06-13 NOTE — Telephone Encounter (Signed)
Spoke with pt, aware application for patient assistance for eliquis has been faxed to the company.

## 2020-07-10 ENCOUNTER — Other Ambulatory Visit: Payer: Self-pay | Admitting: Cardiology

## 2020-08-02 DIAGNOSIS — L42 Pityriasis rosea: Secondary | ICD-10-CM | POA: Diagnosis not present

## 2020-08-12 ENCOUNTER — Other Ambulatory Visit: Payer: Self-pay

## 2020-08-12 ENCOUNTER — Emergency Department (HOSPITAL_BASED_OUTPATIENT_CLINIC_OR_DEPARTMENT_OTHER): Payer: Medicare Other

## 2020-08-12 ENCOUNTER — Observation Stay (HOSPITAL_BASED_OUTPATIENT_CLINIC_OR_DEPARTMENT_OTHER)
Admission: EM | Admit: 2020-08-12 | Discharge: 2020-08-14 | Disposition: A | Discharge status: Home / Self Care | Payer: Medicare Other | Attending: Internal Medicine | Admitting: Internal Medicine

## 2020-08-12 ENCOUNTER — Encounter (HOSPITAL_BASED_OUTPATIENT_CLINIC_OR_DEPARTMENT_OTHER): Payer: Self-pay

## 2020-08-12 DIAGNOSIS — E039 Hypothyroidism, unspecified: Secondary | ICD-10-CM | POA: Insufficient documentation

## 2020-08-12 DIAGNOSIS — I5042 Chronic combined systolic (congestive) and diastolic (congestive) heart failure: Secondary | ICD-10-CM | POA: Insufficient documentation

## 2020-08-12 DIAGNOSIS — K222 Esophageal obstruction: Secondary | ICD-10-CM | POA: Diagnosis not present

## 2020-08-12 DIAGNOSIS — L8990 Pressure ulcer of unspecified site, unspecified stage: Secondary | ICD-10-CM | POA: Diagnosis not present

## 2020-08-12 DIAGNOSIS — Z79899 Other long term (current) drug therapy: Secondary | ICD-10-CM | POA: Diagnosis not present

## 2020-08-12 DIAGNOSIS — R1310 Dysphagia, unspecified: Secondary | ICD-10-CM

## 2020-08-12 DIAGNOSIS — I110 Hypertensive heart disease with heart failure: Secondary | ICD-10-CM | POA: Diagnosis not present

## 2020-08-12 DIAGNOSIS — I252 Old myocardial infarction: Secondary | ICD-10-CM | POA: Insufficient documentation

## 2020-08-12 DIAGNOSIS — M47812 Spondylosis without myelopathy or radiculopathy, cervical region: Secondary | ICD-10-CM | POA: Diagnosis not present

## 2020-08-12 DIAGNOSIS — J439 Emphysema, unspecified: Secondary | ICD-10-CM | POA: Diagnosis not present

## 2020-08-12 DIAGNOSIS — W44F3XA Food entering into or through a natural orifice, initial encounter: Secondary | ICD-10-CM | POA: Diagnosis present

## 2020-08-12 DIAGNOSIS — K225 Diverticulum of esophagus, acquired: Secondary | ICD-10-CM | POA: Insufficient documentation

## 2020-08-12 DIAGNOSIS — I480 Paroxysmal atrial fibrillation: Secondary | ICD-10-CM | POA: Insufficient documentation

## 2020-08-12 DIAGNOSIS — Z87891 Personal history of nicotine dependence: Secondary | ICD-10-CM | POA: Diagnosis not present

## 2020-08-12 DIAGNOSIS — E43 Unspecified severe protein-calorie malnutrition: Secondary | ICD-10-CM | POA: Insufficient documentation

## 2020-08-12 DIAGNOSIS — Z20822 Contact with and (suspected) exposure to covid-19: Secondary | ICD-10-CM | POA: Diagnosis not present

## 2020-08-12 DIAGNOSIS — I2510 Atherosclerotic heart disease of native coronary artery without angina pectoris: Secondary | ICD-10-CM | POA: Insufficient documentation

## 2020-08-12 DIAGNOSIS — Z7901 Long term (current) use of anticoagulants: Secondary | ICD-10-CM

## 2020-08-12 DIAGNOSIS — T18128A Food in esophagus causing other injury, initial encounter: Secondary | ICD-10-CM | POA: Diagnosis not present

## 2020-08-12 DIAGNOSIS — K2100 Gastro-esophageal reflux disease with esophagitis, without bleeding: Principal | ICD-10-CM | POA: Insufficient documentation

## 2020-08-12 DIAGNOSIS — J449 Chronic obstructive pulmonary disease, unspecified: Secondary | ICD-10-CM | POA: Diagnosis not present

## 2020-08-12 LAB — RESP PANEL BY RT-PCR (FLU A&B, COVID) ARPGX2
Influenza A by PCR: NEGATIVE
Influenza B by PCR: NEGATIVE
SARS Coronavirus 2 by RT PCR: NEGATIVE

## 2020-08-12 LAB — CBC WITH DIFFERENTIAL/PLATELET
Abs Immature Granulocytes: 0.02 10*3/uL (ref 0.00–0.07)
Basophils Absolute: 0.1 10*3/uL (ref 0.0–0.1)
Basophils Relative: 1 %
Eosinophils Absolute: 0.1 10*3/uL (ref 0.0–0.5)
Eosinophils Relative: 2 %
HCT: 33.7 % — ABNORMAL LOW (ref 36.0–46.0)
Hemoglobin: 10.5 g/dL — ABNORMAL LOW (ref 12.0–15.0)
Immature Granulocytes: 0 %
Lymphocytes Relative: 24 %
Lymphs Abs: 2 10*3/uL (ref 0.7–4.0)
MCH: 28.6 pg (ref 26.0–34.0)
MCHC: 31.2 g/dL (ref 30.0–36.0)
MCV: 91.8 fL (ref 80.0–100.0)
Monocytes Absolute: 0.6 10*3/uL (ref 0.1–1.0)
Monocytes Relative: 7 %
Neutro Abs: 5.3 10*3/uL (ref 1.7–7.7)
Neutrophils Relative %: 66 %
Platelets: 215 10*3/uL (ref 150–400)
RBC: 3.67 MIL/uL — ABNORMAL LOW (ref 3.87–5.11)
RDW: 13 % (ref 11.5–15.5)
WBC: 8.1 10*3/uL (ref 4.0–10.5)
nRBC: 0 % (ref 0.0–0.2)

## 2020-08-12 LAB — BASIC METABOLIC PANEL
Anion gap: 12 (ref 5–15)
BUN: 15 mg/dL (ref 8–23)
CO2: 29 mmol/L (ref 22–32)
Calcium: 9.5 mg/dL (ref 8.9–10.3)
Chloride: 98 mmol/L (ref 98–111)
Creatinine, Ser: 1.1 mg/dL — ABNORMAL HIGH (ref 0.44–1.00)
GFR, Estimated: 53 mL/min — ABNORMAL LOW (ref >60)
Glucose, Bld: 70 mg/dL (ref 70–99)
Potassium: 4.2 mmol/L (ref 3.5–5.1)
Sodium: 139 mmol/L (ref 135–145)

## 2020-08-12 MED ORDER — GLUCAGON HCL RDNA (DIAGNOSTIC) 1 MG IJ SOLR
1.0000 mg | Freq: Once | INTRAMUSCULAR | Status: AC
  Administered 2020-08-12: 1 mg via INTRAVENOUS
  Filled 2020-08-12: qty 1

## 2020-08-12 MED ORDER — ONDANSETRON HCL 4 MG/2ML IJ SOLN
4.0000 mg | Freq: Once | INTRAMUSCULAR | Status: AC
  Administered 2020-08-12: 4 mg via INTRAVENOUS
  Filled 2020-08-12: qty 2

## 2020-08-12 MED ORDER — DIPHENHYDRAMINE HCL 50 MG/ML IJ SOLN
12.5000 mg | Freq: Once | INTRAMUSCULAR | Status: AC
  Administered 2020-08-12: 12.5 mg via INTRAVENOUS
  Filled 2020-08-12: qty 1

## 2020-08-12 MED ORDER — ALUM & MAG HYDROXIDE-SIMETH 200-200-20 MG/5ML PO SUSP
30.0000 mL | Freq: Once | ORAL | Status: AC
  Administered 2020-08-12: 30 mL via ORAL
  Filled 2020-08-12: qty 30

## 2020-08-12 MED ORDER — METOCLOPRAMIDE HCL 5 MG/ML IJ SOLN
10.0000 mg | Freq: Once | INTRAMUSCULAR | Status: AC
  Administered 2020-08-12: 10 mg via INTRAVENOUS
  Filled 2020-08-12: qty 2

## 2020-08-12 MED ORDER — LIDOCAINE VISCOUS HCL 2 % MT SOLN
15.0000 mL | Freq: Once | OROMUCOSAL | Status: AC
  Administered 2020-08-12: 15 mL via ORAL
  Filled 2020-08-12: qty 15

## 2020-08-12 NOTE — ED Notes (Signed)
Oxygen tank was given to the Pt, The Pt was put on 2L of oxygen.
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
## 2020-08-12 NOTE — ED Triage Notes (Signed)
Pt states she has had food stuck in her throat since 7/13. Pt states she has unable to get any liquid down and states she is unable to swallow her saliva; however pt has not active vomiting or drooling during triage.

## 2020-08-13 ENCOUNTER — Observation Stay (HOSPITAL_COMMUNITY): Payer: Medicare Other | Admitting: Anesthesiology

## 2020-08-13 ENCOUNTER — Encounter (HOSPITAL_COMMUNITY)
Admission: EM | Disposition: A | Discharge status: Home / Self Care | Payer: Self-pay | Source: Home / Self Care | Attending: Emergency Medicine

## 2020-08-13 ENCOUNTER — Encounter (HOSPITAL_COMMUNITY): Payer: Self-pay | Admitting: Internal Medicine

## 2020-08-13 DIAGNOSIS — R1310 Dysphagia, unspecified: Secondary | ICD-10-CM | POA: Diagnosis not present

## 2020-08-13 DIAGNOSIS — K2281 Esophageal polyp: Secondary | ICD-10-CM | POA: Diagnosis not present

## 2020-08-13 DIAGNOSIS — Z79899 Other long term (current) drug therapy: Secondary | ICD-10-CM | POA: Diagnosis not present

## 2020-08-13 DIAGNOSIS — L8990 Pressure ulcer of unspecified site, unspecified stage: Secondary | ICD-10-CM | POA: Diagnosis not present

## 2020-08-13 DIAGNOSIS — I110 Hypertensive heart disease with heart failure: Secondary | ICD-10-CM | POA: Diagnosis not present

## 2020-08-13 DIAGNOSIS — Z7901 Long term (current) use of anticoagulants: Secondary | ICD-10-CM | POA: Diagnosis not present

## 2020-08-13 DIAGNOSIS — J449 Chronic obstructive pulmonary disease, unspecified: Secondary | ICD-10-CM | POA: Diagnosis not present

## 2020-08-13 DIAGNOSIS — T18128A Food in esophagus causing other injury, initial encounter: Secondary | ICD-10-CM

## 2020-08-13 DIAGNOSIS — E039 Hypothyroidism, unspecified: Secondary | ICD-10-CM | POA: Diagnosis not present

## 2020-08-13 DIAGNOSIS — K222 Esophageal obstruction: Secondary | ICD-10-CM | POA: Diagnosis not present

## 2020-08-13 DIAGNOSIS — K225 Diverticulum of esophagus, acquired: Secondary | ICD-10-CM | POA: Diagnosis not present

## 2020-08-13 DIAGNOSIS — K2100 Gastro-esophageal reflux disease with esophagitis, without bleeding: Secondary | ICD-10-CM | POA: Diagnosis not present

## 2020-08-13 DIAGNOSIS — Z87891 Personal history of nicotine dependence: Secondary | ICD-10-CM | POA: Diagnosis not present

## 2020-08-13 DIAGNOSIS — I480 Paroxysmal atrial fibrillation: Secondary | ICD-10-CM | POA: Diagnosis not present

## 2020-08-13 DIAGNOSIS — E7800 Pure hypercholesterolemia, unspecified: Secondary | ICD-10-CM | POA: Diagnosis not present

## 2020-08-13 DIAGNOSIS — Z20822 Contact with and (suspected) exposure to covid-19: Secondary | ICD-10-CM | POA: Diagnosis not present

## 2020-08-13 DIAGNOSIS — I2510 Atherosclerotic heart disease of native coronary artery without angina pectoris: Secondary | ICD-10-CM | POA: Diagnosis not present

## 2020-08-13 DIAGNOSIS — I252 Old myocardial infarction: Secondary | ICD-10-CM | POA: Diagnosis not present

## 2020-08-13 DIAGNOSIS — E43 Unspecified severe protein-calorie malnutrition: Secondary | ICD-10-CM | POA: Diagnosis not present

## 2020-08-13 DIAGNOSIS — I5042 Chronic combined systolic (congestive) and diastolic (congestive) heart failure: Secondary | ICD-10-CM | POA: Diagnosis not present

## 2020-08-13 HISTORY — PX: BIOPSY: SHX5522

## 2020-08-13 HISTORY — PX: ESOPHAGOGASTRODUODENOSCOPY (EGD) WITH PROPOFOL: SHX5813

## 2020-08-13 SURGERY — ESOPHAGOGASTRODUODENOSCOPY (EGD) WITH PROPOFOL
Anesthesia: General

## 2020-08-13 MED ORDER — PROPOFOL 10 MG/ML IV BOLUS
INTRAVENOUS | Status: DC | PRN
  Administered 2020-08-13: 70 mg via INTRAVENOUS

## 2020-08-13 MED ORDER — FLUTICASONE FUROATE-VILANTEROL 100-25 MCG/INH IN AEPB
1.0000 | INHALATION_SPRAY | Freq: Every day | RESPIRATORY_TRACT | Status: DC
  Administered 2020-08-14: 1 via RESPIRATORY_TRACT
  Filled 2020-08-13: qty 28

## 2020-08-13 MED ORDER — ENSURE ENLIVE PO LIQD
237.0000 mL | Freq: Two times a day (BID) | ORAL | Status: DC
  Administered 2020-08-14: 237 mL via ORAL

## 2020-08-13 MED ORDER — ALBUTEROL SULFATE (2.5 MG/3ML) 0.083% IN NEBU: 2.5000 mg | INHALATION_SOLUTION | RESPIRATORY_TRACT | Status: DC | PRN

## 2020-08-13 MED ORDER — DEXAMETHASONE SODIUM PHOSPHATE 10 MG/ML IJ SOLN
INTRAMUSCULAR | Status: DC | PRN
  Administered 2020-08-13: 5 mg via INTRAVENOUS

## 2020-08-13 MED ORDER — ACETAMINOPHEN 325 MG PO TABS: 650.0000 mg | ORAL_TABLET | Freq: Four times a day (QID) | ORAL | Status: DC | PRN

## 2020-08-13 MED ORDER — HEPARIN SODIUM (PORCINE) 5000 UNIT/ML IJ SOLN
5000.0000 [IU] | Freq: Two times a day (BID) | INTRAMUSCULAR | Status: AC
  Administered 2020-08-13: 5000 [IU] via SUBCUTANEOUS
  Filled 2020-08-13: qty 1

## 2020-08-13 MED ORDER — APIXABAN 5 MG PO TABS
5.0000 mg | ORAL_TABLET | Freq: Two times a day (BID) | ORAL | Status: DC
  Administered 2020-08-14: 5 mg via ORAL
  Filled 2020-08-13: qty 1

## 2020-08-13 MED ORDER — ONDANSETRON HCL 4 MG/2ML IJ SOLN: 4.0000 mg | Freq: Four times a day (QID) | INTRAMUSCULAR | Status: DC | PRN

## 2020-08-13 MED ORDER — FENTANYL CITRATE (PF) 100 MCG/2ML IJ SOLN: 25.0000 ug | INTRAMUSCULAR | Status: DC | PRN

## 2020-08-13 MED ORDER — LACTATED RINGERS IV SOLN: INTRAVENOUS | Status: DC

## 2020-08-13 MED ORDER — CALCIUM GLUCONATE 500 MG PO TABS
1.0000 | ORAL_TABLET | Freq: Three times a day (TID) | ORAL | Status: DC
  Filled 2020-08-13: qty 1

## 2020-08-13 MED ORDER — CARVEDILOL 3.125 MG PO TABS
3.1250 mg | ORAL_TABLET | Freq: Two times a day (BID) | ORAL | Status: DC
  Administered 2020-08-13 – 2020-08-14 (×2): 3.125 mg via ORAL
  Filled 2020-08-13 (×2): qty 1

## 2020-08-13 MED ORDER — ALBUTEROL SULFATE HFA 108 (90 BASE) MCG/ACT IN AERS: 2.0000 | INHALATION_SPRAY | RESPIRATORY_TRACT | Status: DC | PRN

## 2020-08-13 MED ORDER — CALCIUM GLUCONATE 500 MG PO TABS
500.0000 mg | ORAL_TABLET | Freq: Two times a day (BID) | ORAL | Status: DC
  Filled 2020-08-13 (×2): qty 1

## 2020-08-13 MED ORDER — ONDANSETRON HCL 4 MG/2ML IJ SOLN
INTRAMUSCULAR | Status: DC | PRN
  Administered 2020-08-13: 4 mg via INTRAVENOUS

## 2020-08-13 MED ORDER — NITROGLYCERIN 0.2 MG/HR TD PT24
0.2000 mg | MEDICATED_PATCH | Freq: Every day | TRANSDERMAL | Status: DC
  Administered 2020-08-13 – 2020-08-14 (×2): 0.2 mg via TRANSDERMAL
  Filled 2020-08-13 (×2): qty 1

## 2020-08-13 MED ORDER — BEMPEDOIC ACID 180 MG PO TABS: 1.0000 | ORAL_TABLET | Freq: Every day | ORAL | Status: DC

## 2020-08-13 MED ORDER — OXYCODONE HCL 5 MG/5ML PO SOLN: 5.0000 mg | Freq: Once | ORAL | Status: DC | PRN

## 2020-08-13 MED ORDER — PROMETHAZINE HCL 25 MG/ML IJ SOLN: 6.2500 mg | INTRAMUSCULAR | Status: DC | PRN

## 2020-08-13 MED ORDER — ONDANSETRON HCL 4 MG PO TABS
4.0000 mg | ORAL_TABLET | Freq: Four times a day (QID) | ORAL | Status: DC | PRN
  Administered 2020-08-13: 4 mg via ORAL
  Filled 2020-08-13: qty 1

## 2020-08-13 MED ORDER — MIDAZOLAM HCL 2 MG/2ML IJ SOLN: 0.5000 mg | Freq: Once | INTRAMUSCULAR | Status: DC | PRN

## 2020-08-13 MED ORDER — EZETIMIBE 10 MG PO TABS
10.0000 mg | ORAL_TABLET | Freq: Every day | ORAL | Status: DC
  Administered 2020-08-13 – 2020-08-14 (×2): 10 mg via ORAL
  Filled 2020-08-13 (×2): qty 1

## 2020-08-13 MED ORDER — ALBUTEROL SULFATE (2.5 MG/3ML) 0.083% IN NEBU: 2.5000 mg | INHALATION_SOLUTION | Freq: Four times a day (QID) | RESPIRATORY_TRACT | Status: DC | PRN

## 2020-08-13 MED ORDER — FENTANYL CITRATE (PF) 250 MCG/5ML IJ SOLN
INTRAMUSCULAR | Status: AC
  Filled 2020-08-13: qty 5

## 2020-08-13 MED ORDER — SODIUM CHLORIDE 0.9 % IV SOLN: INTRAVENOUS | Status: DC

## 2020-08-13 MED ORDER — FLUTICASONE-UMECLIDIN-VILANT 100-62.5-25 MCG/INH IN AEPB: 1.0000 | INHALATION_SPRAY | Freq: Every day | RESPIRATORY_TRACT | Status: DC

## 2020-08-13 MED ORDER — ACETAMINOPHEN 650 MG RE SUPP: 650.0000 mg | Freq: Four times a day (QID) | RECTAL | Status: DC | PRN

## 2020-08-13 MED ORDER — SUCCINYLCHOLINE CHLORIDE 200 MG/10ML IV SOSY
PREFILLED_SYRINGE | INTRAVENOUS | Status: DC | PRN
  Administered 2020-08-13: 100 mg via INTRAVENOUS

## 2020-08-13 MED ORDER — VITAMIN D 25 MCG (1000 UNIT) PO TABS
2000.0000 [IU] | ORAL_TABLET | Freq: Every evening | ORAL | Status: DC
  Administered 2020-08-13: 2000 [IU] via ORAL
  Filled 2020-08-13: qty 2

## 2020-08-13 MED ORDER — CALCIUM CITRATE 950 (200 CA) MG PO TABS
200.0000 mg | ORAL_TABLET | Freq: Every day | ORAL | Status: DC
  Administered 2020-08-14: 200 mg via ORAL
  Filled 2020-08-13: qty 1

## 2020-08-13 MED ORDER — LIDOCAINE 2% (20 MG/ML) 5 ML SYRINGE
INTRAMUSCULAR | Status: DC | PRN
  Administered 2020-08-13: 20 mg via INTRAVENOUS

## 2020-08-13 MED ORDER — FENTANYL CITRATE (PF) 250 MCG/5ML IJ SOLN
INTRAMUSCULAR | Status: DC | PRN
  Administered 2020-08-13: 25 ug via INTRAVENOUS

## 2020-08-13 MED ORDER — MIDAZOLAM HCL 2 MG/2ML IJ SOLN
INTRAMUSCULAR | Status: AC
  Filled 2020-08-13: qty 2

## 2020-08-13 MED ORDER — OXYCODONE HCL 5 MG PO TABS: 5.0000 mg | ORAL_TABLET | Freq: Once | ORAL | Status: DC | PRN

## 2020-08-13 MED ORDER — LEVOTHYROXINE SODIUM 25 MCG PO TABS
25.0000 ug | ORAL_TABLET | Freq: Every day | ORAL | Status: DC
  Administered 2020-08-14: 25 ug via ORAL
  Filled 2020-08-13: qty 1

## 2020-08-13 MED ORDER — OXYCODONE-ACETAMINOPHEN 5-325 MG PO TABS: 1.0000 | ORAL_TABLET | ORAL | Status: DC | PRN

## 2020-08-13 MED ORDER — UMECLIDINIUM BROMIDE 62.5 MCG/INH IN AEPB
1.0000 | INHALATION_SPRAY | Freq: Every day | RESPIRATORY_TRACT | Status: DC
  Administered 2020-08-14: 1 via RESPIRATORY_TRACT
  Filled 2020-08-13: qty 7

## 2020-08-13 SURGICAL SUPPLY — 14 items

## 2020-08-13 NOTE — Anesthesia Preprocedure Evaluation (Addendum)
Anesthesia Evaluation  Patient identified by MRN, date of birth, ID band Patient awake    Reviewed: Allergy & Precautions, NPO status , Patient's Chart, lab work & pertinent test results, reviewed documented beta blocker date and time   History of Anesthesia Complications Negative for: history of anesthetic complications  Airway Mallampati: II  TM Distance: >3 FB Neck ROM: Full    Dental  (+) Edentulous Upper, Edentulous Lower, Upper Dentures, Dental Advisory Given   Pulmonary COPD,  COPD inhaler and oxygen dependent, former smoker,    breath sounds clear to auscultation       Cardiovascular hypertension, Pt. on medications and Pt. on home beta blockers (-) angina+ CAD, + Past MI and + Peripheral Vascular Disease   Rhythm:Regular Rate:Normal  '20 cath:  Mid LAD lesion is 25% stenosed. Mid Cx lesion is 100% stenosed. Left to left collaterals. Mid RCA lesion is 100% stenosed. Left to right collaterals. Dist LM lesion is 25% stenosed. There is mild left ventricular systolic dysfunction.  ejection fraction is 45-50% by visual estimate  10/2018 ECHO: EF 45-50%. LV has mildly decreased function. Grade I DD, mild-mod MR, mild AI, mild TR   Neuro/Psych negative neurological ROS  negative psych ROS   GI/Hepatic Neg liver ROS, GERD  Medicated,Vomiting with this food impaction   Endo/Other  Hypothyroidism   Renal/GU Renal InsufficiencyRenal disease     Musculoskeletal   Abdominal   Peds  Hematology  (+) Blood dyscrasia (Hb 10.5), anemia , eliquis   Anesthesia Other Findings   Reproductive/Obstetrics                           Anesthesia Physical Anesthesia Plan  ASA: 3  Anesthesia Plan: General   Post-op Pain Management:    Induction: Intravenous and Rapid sequence  PONV Risk Score and Plan: 3 and Ondansetron, Dexamethasone and Treatment may vary due to age or medical condition  Airway  Management Planned: Oral ETT  Additional Equipment: None  Intra-op Plan:   Post-operative Plan: Extubation in OR  Informed Consent: I have reviewed the patients History and Physical, chart, labs and discussed the procedure including the risks, benefits and alternatives for the proposed anesthesia with the patient or authorized representative who has indicated his/her understanding and acceptance.       Plan Discussed with: CRNA and Surgeon  Anesthesia Plan Comments:        Anesthesia Quick Evaluation

## 2020-08-13 NOTE — ED Notes (Signed)
Transported to [facility] via stretcher. No signs of distress. No complaints. IV heplock in place. Site c/d/i

## 2020-08-13 NOTE — Anesthesia Procedure Notes (Signed)
Procedure Name: Intubation Date/Time: 08/13/2020 12:16 PM Performed by: Trinna Post., CRNA Pre-anesthesia Checklist: Patient identified, Emergency Drugs available, Suction available, Patient being monitored and Timeout performed Patient Re-evaluated:Patient Re-evaluated prior to induction Oxygen Delivery Method: Circle system utilized Preoxygenation: Pre-oxygenation with 100% oxygen Induction Type: IV induction, Rapid sequence and Cricoid Pressure applied Laryngoscope Size: Mac and 4 Grade View: Grade I Tube type: Oral Tube size: 6.5 mm Number of attempts: 1 Airway Equipment and Method: Stylet Placement Confirmation: ETT inserted through vocal cords under direct vision, positive ETCO2 and breath sounds checked- equal and bilateral Secured at: 21 cm Tube secured with: Tape Dental Injury: Teeth and Oropharynx as per pre-operative assessment

## 2020-08-13 NOTE — Progress Notes (Addendum)
Pt admitted to [facility] from [facility] ED.  Pt is A&OX4 and neuro intact.  Pt placed on telemetry and CCMD notified.  CHG bath completed.  Vitals taken and all within normal range.  Pt is currently resting and not in distress.

## 2020-08-13 NOTE — Plan of Care (Signed)
  Problem: Health Behavior/Discharge Planning: Goal: Ability to manage health-related needs will improve Outcome: Progressing   Problem: Clinical Measurements: Goal: Will remain free from infection Outcome: Progressing Goal: Diagnostic test results will improve Outcome: Progressing   
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
## 2020-08-13 NOTE — Transfer of Care (Signed)
Immediate Anesthesia Transfer of Care Note  Patient: Louella Medaglia  Procedure(s) Performed: ESOPHAGOGASTRODUODENOSCOPY (EGD) WITH PROPOFOL BIOPSY  Patient Location: PACU and Endoscopy Unit  Anesthesia Type:General  Level of Consciousness: awake, alert  and oriented  Airway & Oxygen Therapy: Patient Spontanous Breathing and Patient connected to nasal cannula oxygen  Post-op Assessment: Report given to RN and Post -op Vital signs reviewed and stable  Post vital signs: Reviewed and stable  Last Vitals:  Vitals Value Taken Time  BP 130/57 08/13/20 1241  Temp    Pulse 74 08/13/20 1243  Resp 19 08/13/20 1243  SpO2 100 % 08/13/20 1243  Vitals shown include unvalidated device data.  Last Pain:  Vitals:   08/13/20 1146  TempSrc: Temporal  PainSc: 7          Complications: No notable events documented.

## 2020-08-13 NOTE — Anesthesia Postprocedure Evaluation (Signed)
Anesthesia Post Note  Patient: Dessie Tatem  Procedure(s) Performed: ESOPHAGOGASTRODUODENOSCOPY (EGD) WITH PROPOFOL BIOPSY     Patient location during evaluation: Endoscopy Anesthesia Type: General Level of consciousness: awake and alert, patient cooperative and oriented Pain management: pain level controlled Vital Signs Assessment: post-procedure vital signs reviewed and stable Respiratory status: spontaneous breathing, nonlabored ventilation, respiratory function stable and patient connected to nasal cannula oxygen Cardiovascular status: stable and blood pressure returned to baseline Postop Assessment: no apparent nausea or vomiting Anesthetic complications: no   No notable events documented.  Last Vitals:  Vitals:   08/13/20 1242 08/13/20 1251  BP: (!) 130/57 (!) 148/55  Pulse: 74 71  Resp: 19 19  Temp: (!) 36.4 C   SpO2: 100% 100%    Last Pain:  Vitals:   08/13/20 1242  TempSrc: Temporal  PainSc: 8                  Aunika Kirsten,E. Rayla Pember
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
## 2020-08-13 NOTE — Op Note (Signed)
[facility] Patient Name: Tonya Harper Procedure Date : 08/13/2020 MRN: [id number] Attending MD: Carol Ada , MD Date of Birth: 01-24-1946 CSN: [id number] Age: 75 Admit Type: Inpatient Procedure:                Upper GI endoscopy Indications:              Dysphagia Providers:                Carol Ada, MD, Doristine Johns, RN, Laverda Sorenson, Technician, Dewitt Hoes, CRNA Referring MD:              Medicines:                 Complications:            No immediate complications. Estimated Blood Loss:     Estimated blood loss was minimal. Procedure:                Pre-Anesthesia Assessment:                           - Prior to the procedure, a History and Physical                            was performed, and patient medications and                            allergies were reviewed. The patient's tolerance of                            previous anesthesia was also reviewed. The risks                            and benefits of the procedure and the sedation                            options and risks were discussed with the patient.                            All questions were answered, and informed consent                            was obtained. Prior Anticoagulants: The patient has                            taken no previous anticoagulant or antiplatelet                            agents. ASA Grade Assessment: III - A patient with                            severe systemic disease. After reviewing the risks  and benefits, the patient was deemed in                            satisfactory condition to undergo the procedure.                           - Sedation was administered by an anesthesia                            professional. Deep sedation was attained.                           After obtaining informed consent, the endoscope was                            passed under direct vision. Throughout the                             procedure, the patient's blood pressure, pulse, and                            oxygen saturations were monitored continuously. The                            [id number]-H190 ([id number] Olympus gastroscope was                            introduced through the mouth, and advanced to the                            second part of duodenum. The upper GI endoscopy was                            accomplished without difficulty. The patient                            tolerated the procedure well. Scope In: Scope Out: Findings:      Four benign-appearing, intrinsic moderate (circumferential scarring or       stenosis; an endoscope may pass) stenoses were found in the entire       esophagus. The narrowest stenosis measured 9 mm (inner diameter) x less       than one cm (in length). The stenoses were traversed.      A single 5 mm polyp with no bleeding was found 35 cm from the incisors.       Biopsies were taken with a cold forceps for histology.      The stomach was normal.      The examined duodenum was normal.      Initial passage of the endoscope showed a stricture at 15 cm, which was       in the location of the cervical esophagus. Some resistance was met, but       the endoscope was able to pass through the area. Three other strictures       were encountered at 30 cm, 37 cm, and 43 cm. The most resistance was  encountered at the GE junction (43 cm). Gentle to moderate forward       pressure and tip deflection allowed for the endoscope to pass through       the area. Mucosal breaks were achieved with passage of the endoscope and       the mucosal breaks were noted at 15 cm, 37 cm, and at 43 cm. In the mid       esophagus around 35 cm a polypoid lesion was identified and cold       biopsies samples were obtained. A couple of small diverticulum were       noted. There was no evidence of a food impaction. Impression:               - Benign-appearing esophageal stenoses.                            - Esophageal polyp(s) were found. Biopsied.                           - Normal stomach.                           - Normal examined duodenum. Recommendation:           - Return patient to hospital ward for ongoing care.                           - Resume regular diet.                           - Continue present medications, but continue to                            hold Eliquis for now.                           - Await pathology results.                           - Chew food well. If she suffers with dysphagia                            again today/tonight she can undergo a repeat EGD                            with Savary dilation.                           - If she is well tomorrow, she can go home and                            follow up with Dr. Hilarie Fredrickson as an outpatient in 1-2                            weeks. Procedure Code(s):        --- Professional ---  71252, Esophagogastroduodenoscopy, flexible,                            transoral; with biopsy, single or multiple Diagnosis Code(s):        --- Professional ---                           K22.2, Esophageal obstruction                           K22.8, Other specified diseases of esophagus                           R13.10, Dysphagia, unspecified CPT copyright 2019 [employer]. All rights reserved. The codes documented in this report are preliminary and upon coder review may  be revised to meet current compliance requirements. Carol Ada, MD Carol Ada, MD 08/13/2020 12:42:18 PM This report has been signed electronically. Number of Addenda: 0

## 2020-08-14 DIAGNOSIS — T18128A Food in esophagus causing other injury, initial encounter: Secondary | ICD-10-CM | POA: Diagnosis not present

## 2020-08-14 DIAGNOSIS — K222 Esophageal obstruction: Secondary | ICD-10-CM

## 2020-08-14 DIAGNOSIS — R1310 Dysphagia, unspecified: Secondary | ICD-10-CM

## 2020-08-14 DIAGNOSIS — K2100 Gastro-esophageal reflux disease with esophagitis, without bleeding: Secondary | ICD-10-CM | POA: Diagnosis not present

## 2020-08-14 DIAGNOSIS — L209 Atopic dermatitis, unspecified: Secondary | ICD-10-CM | POA: Diagnosis not present

## 2020-08-14 DIAGNOSIS — K209 Esophagitis, unspecified without bleeding: Secondary | ICD-10-CM | POA: Diagnosis not present

## 2020-08-14 DIAGNOSIS — L8990 Pressure ulcer of unspecified site, unspecified stage: Secondary | ICD-10-CM | POA: Insufficient documentation

## 2020-08-14 DIAGNOSIS — Z7901 Long term (current) use of anticoagulants: Secondary | ICD-10-CM | POA: Diagnosis not present

## 2020-08-14 DIAGNOSIS — L89311 Pressure ulcer of right buttock, stage 1: Secondary | ICD-10-CM | POA: Diagnosis not present

## 2020-08-14 LAB — CBC
HCT: 30.2 % — ABNORMAL LOW (ref 36.0–46.0)
Hemoglobin: 9.3 g/dL — ABNORMAL LOW (ref 12.0–15.0)
MCH: 28.4 pg (ref 26.0–34.0)
MCHC: 30.8 g/dL (ref 30.0–36.0)
MCV: 92.1 fL (ref 80.0–100.0)
Platelets: 189 10*3/uL (ref 150–400)
RBC: 3.28 MIL/uL — ABNORMAL LOW (ref 3.87–5.11)
RDW: 12.8 % (ref 11.5–15.5)
WBC: 6.1 10*3/uL (ref 4.0–10.5)
nRBC: 0 % (ref 0.0–0.2)

## 2020-08-14 LAB — BASIC METABOLIC PANEL
Anion gap: 7 (ref 5–15)
BUN: 19 mg/dL (ref 8–23)
CO2: 31 mmol/L (ref 22–32)
Calcium: 8.9 mg/dL (ref 8.9–10.3)
Chloride: 96 mmol/L — ABNORMAL LOW (ref 98–111)
Creatinine, Ser: 1.16 mg/dL — ABNORMAL HIGH (ref 0.44–1.00)
GFR, Estimated: 50 mL/min — ABNORMAL LOW (ref >60)
Glucose, Bld: 126 mg/dL — ABNORMAL HIGH (ref 70–99)
Potassium: 3.9 mmol/L (ref 3.5–5.1)
Sodium: 134 mmol/L — ABNORMAL LOW (ref 135–145)

## 2020-08-14 MED ORDER — ENSURE ENLIVE PO LIQD: 237.0000 mL | Freq: Two times a day (BID) | ORAL | 0 refills | Status: AC

## 2020-08-14 NOTE — Progress Notes (Signed)
Discharge instructions (including medications) discussed with and copy provided to patient/caregiver 

## 2020-08-14 NOTE — Plan of Care (Signed)
  Problem: Education: Goal: Knowledge of General Education information will improve Description: Including pain rating scale, medication(s)/side effects and non-pharmacologic comfort measures Outcome: Adequate for Discharge   

## 2020-08-14 NOTE — Progress Notes (Signed)
Progress Note   Subjective  Patient states she has been eating well since her EGD yesterday. Tolerating meal this AM, states she has noticed a tremendous improvement following her procedure. No bleeding.    Objective   Vital signs in last 24 hours: Temp:  [96.6 F (35.9 C)-99.8 F (37.7 C)] 97.4 F (36.3 C) (07/18 0334) Pulse Rate:  [64-82] 64 (07/18 0807) Resp:  [14-20] 18 (07/18 0807) BP: (115-156)/(52-74) 131/72 (07/18 0334) SpO2:  [90 %-100 %] 98 % (07/18 0807) FiO2 (%):  [28 %] 28 % (07/18 0807) Last BM Date: 08/13/20 General:    female in NAD Neurologic:  Alert and oriented,  grossly normal neurologically. Psych:  Cooperative. Normal mood and affect.  Intake/Output from previous day: 07/17 0701 - 07/18 0700 In: 400 [I.V.:400] Out: 0  Intake/Output this shift: No intake/output data recorded.  Lab Results: Recent Labs    08/12/20 2134 08/14/20 0430  WBC 8.1 6.1  HGB 10.5* 9.3*  HCT 33.7* 30.2*  PLT 215 189   BMET Recent Labs    08/12/20 2134 08/14/20 0430  NA 139 134*  K 4.2 3.9  CL 98 96*  CO2 29 31  GLUCOSE 70 126*  BUN 15 19  CREATININE 1.10* 1.16*  CALCIUM 9.5 8.9   LFT No results for input(s): PROT, ALBUMIN, AST, ALT, ALKPHOS, BILITOT, BILIDIR, IBILI in the last 72 hours. PT/INR No results for input(s): LABPROT, INR in the last 72 hours.  Studies/Results: DG Neck Soft Tissue  Result Date: 08/12/2020 CLINICAL DATA:  Food impaction, inability to swallow EXAM: NECK SOFT TISSUES - 1+ VIEW COMPARISON:  02/02/2019, 05/02/2020 FINDINGS: Frontal and lateral views of the soft tissues of the neck are obtained. Airways widely patent. Epiglottis is unremarkable. No radiopaque foreign bodies are seen within the neck or upper chest. Mild cervical spondylosis at C4-5 and C5-6. No acute bony abnormalities. Lung apices are clear. Stable diffuse atherosclerosis. IMPRESSION: 1. Unremarkable soft tissues of the neck. Electronically Signed   By: Randa Ngo M.D.   On: 08/12/2020 22:28   DG Chest 2 View  Result Date: 08/12/2020 CLINICAL DATA:  Food impaction EXAM: CHEST - 2 VIEW COMPARISON:  04/25/2020 05/02/2020 FINDINGS: Hyperinflation with emphysematous disease. Aortic atherosclerosis. No focal opacity or pleural effusion. Normal cardiomediastinal silhouette. No pneumothorax. Moderate compression deformity of L1 with progressed loss of height since April 2022 CT. IMPRESSION: No active cardiopulmonary disease.  Emphysematous disease Electronically Signed   By: Donavan Foil M.D.   On: 08/12/2020 22:28       Assessment / Plan:    75 y/o female on Eliquis with a history of esophageal stricturing admitting with intolerance of PO due to esophageal strictures. She had an EGD yesterday with Dr. Benson Norway, reviewed her case with him, she had 4 areas of stricturing which was dilated with the endoscope itself (15cm from the incisors down to the GEJ). Post procedure she has done quite well, eating her breakfast at this time without difficulty, notes tremendous improvement. Okay to discharge home from our perspective today on soft diet. Would avoid meats / chicken / uncooked raw vegetables such as carrots, etc, to avoid impaction until we can see her in follow up. Our office will coordinate with her for repeat EGD with additional dilation and biopsies of esophageal polyp in the upcoming weeks. Okay to resume Eliquis today. Patient in agreement with plan.  Plan: - okay to discharge home - soft diet as above - okay to resume  Eliquis today - our office will coordinate outpatient follow up for repeat EGD with dilation and biopsy of esophageal polyp  Jolly Mango, MD [facility]

## 2020-08-14 NOTE — Discharge Summary (Signed)
Physician Discharge Summary  Tonya Harper YQI:[id number] DOB: [birth year] DOA: 08/12/2020  PCP: Leeroy Cha, MD  Admit date: 08/12/2020 Discharge date: 08/14/2020  Admitted From: Home  Disposition:   Home   Recommendations for Outpatient Follow-up and new medication changes:  Follow up with Dr. Fara Olden in 7 to 10 days.  Follow up with GI as scheduled to repeat EGD with dilatation and biopsy of esophageal polyp.  Resumed apixaban Continue with soft diet. Would avoid meats / chicken / uncooked raw vegetables such as carrots, etc, to avoid impaction until outpatient GI follow up.  Home Health:no   Equipment/Devices: na    Discharge Condition: stable  CODE STATUS: full  Diet recommendation:  soft diet.   Brief/Interim Summary: Tonya Harper was admitted to the hospital with the working diagnosis of symptomatic esophageal strictures. Acute asophageal food impaction.   75 yo female with the past medical history of esophageal stenosis, paroxysmal atrial fibrillation, chronic heart failure, COPD, HTN and dyslipidemia, who presented with sensation of food being stuck in the middle of her chest, along of drooling saliva. Patient was diagnosed with food impaction and underwent emergent EGD followed by esophageal dilatation. Post procedure her blood pressure was 130/57, HR 74, RR 19, Temp 97.5 and oxygenation 100%, lungs clear to auscultation, heart with S1 and S2 present and rhythmic, abdomen soft and non tender, no lower extremity edema.   NA 139, K 4,2, CL 98, Bicarb 29, glucose 70, BUN 15, cr 1,1, wbc 8,1, Hgb 10,5 Hct 33,7, PLT 215. SARS covid 19 negative.   Chest film with hyperinflation but no infiltrates. Neck radiograph with no changes.   Acute food impaction due to esophageal strictures. Patient underwent urgent EGD, showing benign-appearing esophageal stenosis.  Esophageal polyp (biopsied).   Diet was advanced to soft with good toleration. Plan to follow up as out  patient with GI to repeat endoscopy.  2. Paroxysmal atrial fibrillation. Patient will resume apixaban at discharge, continue rate control with carvedilol.   3. Severe calorie protein malnutrition/ hypoglycemia. Patient was placed on nutritional supplements.  At discharge her fasting glucose was 126 mg/dl. Continue soft diet per GI recommendations.   4. COPD. No clinical signs of exacerbation, follow as outpatient.   5. HTN/ dyslipidemia. Continue blood pressure control with carvedilol.  Continue with nexletol.   6. Hypothyroid. Continue taking levothyroxine.   7. Right buttock stage 1 pressure ulcer, present on admission. Continue local skin care.   Discharge Diagnoses:  Active Problems:   Esophageal stricture   Food impaction of esophagus   Dysphagia   Anticoagulated   Pressure injury of skin    Discharge Instructions   Allergies as of 08/14/2020       Reactions   Aspirin Swelling   nausea and dizziness after taking for one month at a time.. Tolerates the 81 mg    Pneumovax [pneumococcal Polysaccharide Vaccine] Hives, Swelling   Shellfish Allergy Anaphylaxis, Swelling   Medication withdrawal symptoms   Lipitor [atorvastatin Calcium] Other (See Comments)   Recurrent myalgia with lipior   Ibuprofen Nausea And Vomiting   dizziness   Tdap [tetanus-diphth-acell Pertussis] Swelling, Rash        Medication List     STOP taking these medications    denosumab 60 MG/ML Sosy injection Commonly known as: PROLIA   ezetimibe 10 MG tablet Commonly known as: ZETIA   Fluad Quadrivalent 0.5 ML injection Generic drug: influenza vaccine adjuvanted   furosemide 20 MG tablet Commonly known as: LASIX   Pfizer-BioNTech COVID-19  Vacc 30 MCG/0.3ML injection Generic drug: COVID-19 mRNA vaccine (Pfizer)       TAKE these medications    acetaminophen 325 MG tablet Commonly known as: TYLENOL Take 650 mg by mouth every 6 (six) hours as needed for mild pain or moderate  pain.   albuterol 108 (90 Base) MCG/ACT inhaler Commonly known as: VENTOLIN HFA Inhale 2 puffs into the lungs every 4 (four) hours as needed for wheezing or shortness of breath.   albuterol (2.5 MG/3ML) 0.083% nebulizer solution Commonly known as: PROVENTIL Take 3 mLs (2.5 mg total) by nebulization every 6 (six) hours as needed for wheezing or shortness of breath.   calcium gluconate 500 MG tablet Take 500 mg by mouth 2 (two) times daily.   carvedilol 3.125 MG tablet Commonly known as: COREG Take 3.125 mg by mouth 2 (two) times daily with a meal.   cholecalciferol 25 MCG (1000 UNIT) tablet Commonly known as: VITAMIN D3 Take 2,000 Units by mouth every evening.   clotrimazole 1 % cream Commonly known as: LOTRIMIN Apply 1 application topically 4 (four) times daily as needed.   Eliquis 5 MG Tabs tablet Generic drug: apixaban TAKE 1 TABLET BY MOUTH TWICE A DAY   feeding supplement Liqd Take 237 mLs by mouth 2 (two) times daily between meals.   gabapentin 100 MG capsule Commonly known as: NEURONTIN Take 100 mg by mouth as needed (pain).   levothyroxine 25 MCG tablet Commonly known as: SYNTHROID Take 25 mcg by mouth daily.   Nexletol 180 MG Tabs Generic drug: Bempedoic Acid TAKE 1 TABLET BY MOUTH DAILY.   nitroGLYCERIN 0.2 mg/hr patch Commonly known as: NITRODUR - Dosed in mg/24 hr PLACE 1 PATCH (0.2 MG TOTAL) ONTO THE SKIN DAILY.   oxyCODONE-acetaminophen 5-325 MG tablet Commonly known as: PERCOCET/ROXICET Take 1 tablet by mouth every 4 (four) hours as needed for severe pain.   Trelegy Ellipta 100-62.5-25 MCG/INH Aepb Generic drug: Fluticasone-Umeclidin-Vilant INHALE 1 PUFF BY MOUTH EVERY DAY   triamcinolone cream 0.1 % Commonly known as: KENALOG Apply 1 application topically daily as needed (skin irritation).   UNABLE TO FIND Inhale into the lungs daily as needed (shortness of breath). (Oxygen) 2-3 liters   valACYclovir 1000 MG tablet Commonly known as:  VALTREX Take 1,000 mg by mouth 2 (two) times daily.   Vitamin D (Ergocalciferol) 1.25 MG (50000 UNIT) Caps capsule Commonly known as: DRISDOL Take 50,000 Units by mouth once a week.        Allergies  Allergen Reactions   Aspirin Swelling    nausea and dizziness after taking for one month at a time.. Tolerates the 81 mg    Pneumovax [Pneumococcal Polysaccharide Vaccine] Hives and Swelling   Shellfish Allergy Anaphylaxis and Swelling    Medication withdrawal symptoms     Lipitor [Atorvastatin Calcium] Other (See Comments)    Recurrent myalgia with lipior   Ibuprofen Nausea And Vomiting    dizziness   Tdap [Tetanus-Diphth-Acell Pertussis] Swelling and Rash    Consultations: GI    Procedures/Studies: DG Neck Soft Tissue  Result Date: 08/12/2020 CLINICAL DATA:  Food impaction, inability to swallow EXAM: NECK SOFT TISSUES - 1+ VIEW COMPARISON:  02/02/2019, 05/02/2020 FINDINGS: Frontal and lateral views of the soft tissues of the neck are obtained. Airways widely patent. Epiglottis is unremarkable. No radiopaque foreign bodies are seen within the neck or upper chest. Mild cervical spondylosis at C4-5 and C5-6. No acute bony abnormalities. Lung apices are clear. Stable diffuse atherosclerosis. IMPRESSION: 1. Unremarkable soft  tissues of the neck. Electronically Signed   By: Randa Ngo M.D.   On: 08/12/2020 22:28   DG Chest 2 View  Result Date: 08/12/2020 CLINICAL DATA:  Food impaction EXAM: CHEST - 2 VIEW COMPARISON:  04/25/2020 05/02/2020 FINDINGS: Hyperinflation with emphysematous disease. Aortic atherosclerosis. No focal opacity or pleural effusion. Normal cardiomediastinal silhouette. No pneumothorax. Moderate compression deformity of L1 with progressed loss of height since April 2022 CT. IMPRESSION: No active cardiopulmonary disease.  Emphysematous disease Electronically Signed   By: Donavan Foil M.D.   On: 08/12/2020 22:28     Procedures: EGD  Subjective: Patient is  feeling better, tolerating well soft diet, no chest pain or dyspnea, no nausea or vomiting.   Discharge Exam: Vitals:   08/14/20 0334 08/14/20 0807  BP: 131/72   Pulse: 82 64  Resp: 18 18  Temp: (!) 97.4 F (36.3 C)   SpO2: 98% 98%   Vitals:   08/13/20 1921 08/13/20 2327 08/14/20 0334 08/14/20 0807  BP: 121/60 123/66 131/72   Pulse: 78 78 82 64  Resp: 20 20 18 18   Temp: 98.3 F (36.8 C) 97.8 F (36.6 C) (!) 97.4 F (36.3 C)   TempSrc: Oral Oral Oral   SpO2: 91% 97% 98% 98%  Weight:      Height:        General: Not in pain or dyspnea.  Neurology: Awake and alert, non focal  E ENT: no pallor, no icterus, oral mucosa moist Cardiovascular: No JVD. S1-S2 present, rhythmic, no gallops, rubs, or murmurs. No lower extremity edema. Pulmonary: positive breath sounds bilaterally, adequate air movement, no wheezing, rhonchi or rales. Gastrointestinal. Abdomen soft and non tender Skin. No rashes Musculoskeletal: no joint deformities   The results of significant diagnostics from this hospitalization (including imaging, microbiology, ancillary and laboratory) are listed below for reference.     Microbiology: Recent Results (from the past 240 hour(s))  Resp Panel by RT-PCR (Flu A&B, Covid) Nasopharyngeal Swab     Status: None   Collection Time: 08/12/20  9:34 PM   Specimen: Nasopharyngeal Swab; Nasopharyngeal(NP) swabs in vial transport medium  Result Value Ref Range Status   SARS Coronavirus 2 by RT PCR NEGATIVE NEGATIVE Final    Comment: (NOTE) SARS-CoV-2 target nucleic acids are NOT DETECTED.  The SARS-CoV-2 RNA is generally detectable in upper respiratory specimens during the acute phase of infection. The lowest concentration of SARS-CoV-2 viral copies this assay can detect is 138 copies/mL. A negative result does not preclude SARS-Cov-2 infection and should not be used as the sole basis for treatment or other patient management decisions. A negative result may occur with   improper specimen collection/handling, submission of specimen other than nasopharyngeal swab, presence of viral mutation(s) within the areas targeted by this assay, and inadequate number of viral copies(<138 copies/mL). A negative result must be combined with clinical observations, patient history, and epidemiological information. The expected result is Negative.  Fact Sheet for Patients:  EntrepreneurPulse.com.au  Fact Sheet for Healthcare Providers:  IncredibleEmployment.be  This test is no t yet approved or cleared by the [place] FDA and  has been authorized for detection and/or diagnosis of SARS-CoV-2 by FDA under an Emergency Use Authorization (EUA). This EUA will remain  in effect (meaning this test can be used) for the duration of the COVID-19 declaration under Section 564(b)(1) of the Act, 21 U.S.C.section 360bbb-3(b)(1), unless the authorization is terminated  or revoked sooner.       Influenza A by PCR NEGATIVE  NEGATIVE Final   Influenza B by PCR NEGATIVE NEGATIVE Final    Comment: (NOTE) The Xpert Xpress SARS-CoV-2/FLU/RSV plus assay is intended as an aid in the diagnosis of influenza from Nasopharyngeal swab specimens and should not be used as a sole basis for treatment. Nasal washings and aspirates are unacceptable for Xpert Xpress SARS-CoV-2/FLU/RSV testing.  Fact Sheet for Patients: EntrepreneurPulse.com.au  Fact Sheet for Healthcare Providers: IncredibleEmployment.be  This test is not yet approved or cleared by the [place] FDA and has been authorized for detection and/or diagnosis of SARS-CoV-2 by FDA under an Emergency Use Authorization (EUA). This EUA will remain in effect (meaning this test can be used) for the duration of the COVID-19 declaration under Section 564(b)(1) of the Act, 21 U.S.C. section 360bbb-3(b)(1), unless the authorization is terminated  or revoked.  Performed at [facility], [address] [place], [place] [address]      Labs: BNP (last 3 results) No results for input(s): BNP in the last 8760 hours. Basic Metabolic Panel: Recent Labs  Lab 08/12/20 2134 08/14/20 0430  NA 139 134*  K 4.2 3.9  CL 98 96*  CO2 29 31  GLUCOSE 70 126*  BUN 15 19  CREATININE 1.10* 1.16*  CALCIUM 9.5 8.9   Liver Function Tests: No results for input(s): AST, ALT, ALKPHOS, BILITOT, PROT, ALBUMIN in the last 168 hours. No results for input(s): LIPASE, AMYLASE in the last 168 hours. No results for input(s): AMMONIA in the last 168 hours. CBC: Recent Labs  Lab 08/12/20 2134 08/14/20 0430  WBC 8.1 6.1  NEUTROABS 5.3  --   HGB 10.5* 9.3*  HCT 33.7* 30.2*  MCV 91.8 92.1  PLT 215 189   Cardiac Enzymes: No results for input(s): CKTOTAL, CKMB, CKMBINDEX, TROPONINI in the last 168 hours. BNP: Invalid input(s): POCBNP CBG: No results for input(s): GLUCAP in the last 168 hours. D-Dimer No results for input(s): DDIMER in the last 72 hours. Hgb A1c No results for input(s): HGBA1C in the last 72 hours. Lipid Profile No results for input(s): CHOL, HDL, LDLCALC, TRIG, CHOLHDL, LDLDIRECT in the last 72 hours. Thyroid function studies No results for input(s): TSH, T4TOTAL, T3FREE, THYROIDAB in the last 72 hours.  Invalid input(s): FREET3 Anemia work up No results for input(s): VITAMINB12, FOLATE, FERRITIN, TIBC, IRON, RETICCTPCT in the last 72 hours. Urinalysis    Component Value Date/Time   BILIRUBINUR neg 10/21/2012 1022   PROTEINUR neg 10/21/2012 1022   UROBILINOGEN negative 10/21/2012 1022   NITRITE neg 10/21/2012 1022   LEUKOCYTESUR Negative 10/21/2012 1022   Sepsis Labs Invalid input(s): PROCALCITONIN,  WBC,  LACTICIDVEN Microbiology Recent Results (from the past 240 hour(s))  Resp Panel by RT-PCR (Flu A&B, Covid) Nasopharyngeal Swab     Status: None   Collection Time: 08/12/20  9:34 PM   Specimen:  Nasopharyngeal Swab; Nasopharyngeal(NP) swabs in vial transport medium  Result Value Ref Range Status   SARS Coronavirus 2 by RT PCR NEGATIVE NEGATIVE Final    Comment: (NOTE) SARS-CoV-2 target nucleic acids are NOT DETECTED.  The SARS-CoV-2 RNA is generally detectable in upper respiratory specimens during the acute phase of infection. The lowest concentration of SARS-CoV-2 viral copies this assay can detect is 138 copies/mL. A negative result does not preclude SARS-Cov-2 infection and should not be used as the sole basis for treatment or other patient management decisions. A negative result may occur with  improper specimen collection/handling, submission of specimen other than nasopharyngeal swab, presence of viral  mutation(s) within the areas targeted by this assay, and inadequate number of viral copies(<138 copies/mL). A negative result must be combined with clinical observations, patient history, and epidemiological information. The expected result is Negative.  Fact Sheet for Patients:  EntrepreneurPulse.com.au  Fact Sheet for Healthcare Providers:  IncredibleEmployment.be  This test is no t yet approved or cleared by the [place] FDA and  has been authorized for detection and/or diagnosis of SARS-CoV-2 by FDA under an Emergency Use Authorization (EUA). This EUA will remain  in effect (meaning this test can be used) for the duration of the COVID-19 declaration under Section 564(b)(1) of the Act, 21 U.S.C.section 360bbb-3(b)(1), unless the authorization is terminated  or revoked sooner.       Influenza A by PCR NEGATIVE NEGATIVE Final   Influenza B by PCR NEGATIVE NEGATIVE Final    Comment: (NOTE) The Xpert Xpress SARS-CoV-2/FLU/RSV plus assay is intended as an aid in the diagnosis of influenza from Nasopharyngeal swab specimens and should not be used as a sole basis for treatment. Nasal washings and aspirates are unacceptable for  Xpert Xpress SARS-CoV-2/FLU/RSV testing.  Fact Sheet for Patients: EntrepreneurPulse.com.au  Fact Sheet for Healthcare Providers: IncredibleEmployment.be  This test is not yet approved or cleared by the [place] FDA and has been authorized for detection and/or diagnosis of SARS-CoV-2 by FDA under an Emergency Use Authorization (EUA). This EUA will remain in effect (meaning this test can be used) for the duration of the COVID-19 declaration under Section 564(b)(1) of the Act, 21 U.S.C. section 360bbb-3(b)(1), unless the authorization is terminated or revoked.  Performed at [facility], [address] [place], [place] [address]      Time coordinating discharge: 45 minutes  SIGNED:   Tawni Millers, MD  [facility] 08/14/2020, 8:39 AM

## 2020-08-15 ENCOUNTER — Encounter (HOSPITAL_COMMUNITY): Payer: Self-pay | Admitting: Gastroenterology

## 2020-08-15 LAB — SURGICAL PATHOLOGY

## 2020-08-16 DIAGNOSIS — S32000D Wedge compression fracture of unspecified lumbar vertebra, subsequent encounter for fracture with routine healing: Secondary | ICD-10-CM | POA: Diagnosis not present

## 2020-08-16 DIAGNOSIS — M7541 Impingement syndrome of right shoulder: Secondary | ICD-10-CM | POA: Diagnosis not present

## 2020-08-16 DIAGNOSIS — M810 Age-related osteoporosis without current pathological fracture: Secondary | ICD-10-CM | POA: Diagnosis not present

## 2020-08-18 ENCOUNTER — Telehealth: Payer: Self-pay

## 2020-08-18 NOTE — Telephone Encounter (Signed)
Dr. Henrene Pastor has a hospital block on 08/31/20. Now there are only 2 pts scheduled that day and the 7:30am slot is still open also. Please let me know if you want me to schedule her on this day.

## 2020-08-18 NOTE — Telephone Encounter (Signed)
-----   Message from Jerene Bears, MD sent at 08/14/2020  1:18 PM EDT ----- Regarding: RE: pyrtle outpatient follow up / EGD Probably ok to return for egd with dilation but difficult to know when is best. I have a hospital week coming up, starting 2 weeks from today.  We cold look for some time on this week.  Can you check to see when VC has hospital availability also...Marland Kitchenhe seems to have the most hospital time esp given his normal tif blocks which go unfilled at times Take a look at my hosp week and let me know if you see opportunity.  I do not want to have commitment to more than 1 outpatient on any given day JMP  ----- Message ----- From: [name], RN Sent: 08/14/2020   8:36 AM EDT To: Jerene Bears, MD Subject: FW: pyrtle outpatient follow up / EGD          See below and advise. ----- Message ----- From: Yetta Flock, MD Sent: 08/14/2020   8:25 AM EDT To: [name], RN Subject: pyrtle outpatient follow up / EGD              [name] this is a [facility] patient who was admitted over the weekend with worsening dysphagia and had an EGD. Going home today. I think she would benefit from a repeat EGD with dilation in the upcoming weeks but she is on supplemental oxygen, would need to be done at the hospital. Defer to Dr. Hilarie Fredrickson if he would like to see her in the office first to discuss her case or direct book at the hospital for EGD. She is a challenging case. Thanks

## 2020-08-20 NOTE — Telephone Encounter (Signed)
Tonya Harper Would you mind reviewing this case and consider adding her to our outpatient hospital schedule on 08/31/20? Recent inpt EGD and needs repeat and dilation Nothing immediately obvious with my schedule in the coming 6 weeks If questions or not possible for you, I understand and will look for other times Thanks for your review JMP

## 2020-08-21 ENCOUNTER — Telehealth: Payer: Self-pay

## 2020-08-21 ENCOUNTER — Other Ambulatory Visit: Payer: Self-pay

## 2020-08-21 DIAGNOSIS — N183 Chronic kidney disease, stage 3 unspecified: Secondary | ICD-10-CM | POA: Diagnosis not present

## 2020-08-21 DIAGNOSIS — I10 Essential (primary) hypertension: Secondary | ICD-10-CM | POA: Diagnosis not present

## 2020-08-21 DIAGNOSIS — R1310 Dysphagia, unspecified: Secondary | ICD-10-CM

## 2020-08-21 NOTE — Telephone Encounter (Signed)
[facility] Pre-operative Risk Assessment     Request for surgical clearance:     Endoscopy Procedure  What type of surgery is being performed?     EGD with dilation  When is this surgery scheduled?     08/31/20  What type of clearance is required ?   Pharmacy  Are there any medications that need to be held prior to surgery and how long? Eliquis hold for 3 days  Practice name and name of physician performing surgery?      [facility]  What is your office phone and fax number?      Phone- [phone]  [phone]  Anesthesia type (None, local, MAC, general) ?       MAC

## 2020-08-21 NOTE — Telephone Encounter (Signed)
Pt scheduled for egd with dil at [facility] 08/31/20 at 10:30am. Pt to arrive there at 9am. Pt to be NPO after midnight. Note sent to [facility] card for permission to hold Eliquis for 3 days prior to the procedure.

## 2020-08-22 ENCOUNTER — Other Ambulatory Visit: Payer: Self-pay

## 2020-08-22 DIAGNOSIS — R1310 Dysphagia, unspecified: Secondary | ICD-10-CM

## 2020-08-22 NOTE — Telephone Encounter (Signed)
   Primary Cardiologist: Kirk Ruths, MD  Chart reviewed as part of pre-operative protocol coverage. Given past medical history and time since last visit, based on ACC/AHA guidelines, Jesa Matters would be at acceptable risk for the planned procedure without further cardiovascular testing.   Patient with diagnosis of afib on Eliquis for anticoagulation.     Procedure: EGD with dilation Date of procedure: 08/31/20   CHA2DS2-VASc Score = 5  This indicates a 7.2% annual risk of stroke. The patient's score is based upon: CHF History: Yes HTN History: Yes Diabetes History: No Stroke History: No Vascular Disease History: Yes Age Score: 1 Gender Score: 1    CrCl 64m/min Platelet count 189K   Request is to hold for 3 days, however recommend holding Eliquis for 2 days prior to procedure per protocol.  Patient was advised that if she develops new symptoms prior to surgery to contact our office to arrange a follow-up appointment.  She verbalized understanding.  I will route this recommendation to the requesting party via Epic fax function and remove from pre-op pool.  Please call with questions.  JJossie Ng Almus Woodham NP-C    08/22/2020, 8:36 AM [address] [place] [phone] [phone]

## 2020-08-22 NOTE — Telephone Encounter (Signed)
Pt is scheduled for EGD with dilation at [facility] 08/31/20 at 10:30am. Pt is on eliquis and Dr. Stanford Breed states pt may hold eliquis for 2 days prior to procedure. Pt aware. Pt mailed the appt information and she has already been notified of the eliquis hold by cardiology.

## 2020-08-22 NOTE — Telephone Encounter (Signed)
Patient with diagnosis of afib on Eliquis for anticoagulation.    Procedure: EGD with dilation Date of procedure: 08/31/20  CHA2DS2-VASc Score = 5  This indicates a 7.2% annual risk of stroke. The patient's score is based upon: CHF History: Yes HTN History: Yes Diabetes History: No Stroke History: No Vascular Disease History: Yes Age Score: 1 Gender Score: 1   CrCl 34m/min Platelet count 189K  Request is to hold for 3 days, however recommend holding Eliquis for 2 days prior to procedure per protocol.

## 2020-08-31 ENCOUNTER — Other Ambulatory Visit: Payer: Self-pay

## 2020-08-31 ENCOUNTER — Encounter (HOSPITAL_COMMUNITY)
Admission: RE | Disposition: A | Discharge status: Home / Self Care | Payer: Self-pay | Source: Home / Self Care | Attending: Internal Medicine

## 2020-08-31 ENCOUNTER — Ambulatory Visit (HOSPITAL_COMMUNITY): Payer: Medicare Other | Admitting: Registered Nurse

## 2020-08-31 ENCOUNTER — Ambulatory Visit (HOSPITAL_COMMUNITY)
Admission: RE | Admit: 2020-08-31 | Discharge: 2020-08-31 | Disposition: A | Discharge status: Home / Self Care | Payer: Medicare Other | Attending: Internal Medicine | Admitting: Internal Medicine

## 2020-08-31 ENCOUNTER — Ambulatory Visit (HOSPITAL_COMMUNITY): Payer: Medicare Other

## 2020-08-31 ENCOUNTER — Encounter (HOSPITAL_COMMUNITY): Payer: Self-pay | Admitting: Internal Medicine

## 2020-08-31 ENCOUNTER — Telehealth: Payer: Self-pay

## 2020-08-31 DIAGNOSIS — Z7901 Long term (current) use of anticoagulants: Secondary | ICD-10-CM | POA: Diagnosis not present

## 2020-08-31 DIAGNOSIS — I214 Non-ST elevation (NSTEMI) myocardial infarction: Secondary | ICD-10-CM | POA: Diagnosis not present

## 2020-08-31 DIAGNOSIS — B3781 Candidal esophagitis: Secondary | ICD-10-CM | POA: Diagnosis not present

## 2020-08-31 DIAGNOSIS — K229 Disease of esophagus, unspecified: Secondary | ICD-10-CM

## 2020-08-31 DIAGNOSIS — Z886 Allergy status to analgesic agent status: Secondary | ICD-10-CM | POA: Diagnosis not present

## 2020-08-31 DIAGNOSIS — Z888 Allergy status to other drugs, medicaments and biological substances status: Secondary | ICD-10-CM | POA: Insufficient documentation

## 2020-08-31 DIAGNOSIS — Z87891 Personal history of nicotine dependence: Secondary | ICD-10-CM | POA: Insufficient documentation

## 2020-08-31 DIAGNOSIS — R1310 Dysphagia, unspecified: Secondary | ICD-10-CM | POA: Diagnosis not present

## 2020-08-31 DIAGNOSIS — D130 Benign neoplasm of esophagus: Secondary | ICD-10-CM | POA: Diagnosis not present

## 2020-08-31 DIAGNOSIS — Z887 Allergy status to serum and vaccine status: Secondary | ICD-10-CM | POA: Diagnosis not present

## 2020-08-31 DIAGNOSIS — K222 Esophageal obstruction: Secondary | ICD-10-CM | POA: Diagnosis not present

## 2020-08-31 DIAGNOSIS — E876 Hypokalemia: Secondary | ICD-10-CM | POA: Diagnosis not present

## 2020-08-31 DIAGNOSIS — J441 Chronic obstructive pulmonary disease with (acute) exacerbation: Secondary | ICD-10-CM | POA: Diagnosis not present

## 2020-08-31 DIAGNOSIS — K219 Gastro-esophageal reflux disease without esophagitis: Secondary | ICD-10-CM | POA: Insufficient documentation

## 2020-08-31 HISTORY — PX: BIOPSY: SHX5522

## 2020-08-31 HISTORY — PX: SAVORY DILATION: SHX5439

## 2020-08-31 HISTORY — PX: ESOPHAGOGASTRODUODENOSCOPY (EGD) WITH PROPOFOL: SHX5813

## 2020-08-31 SURGERY — ESOPHAGOGASTRODUODENOSCOPY (EGD) WITH PROPOFOL
Anesthesia: Monitor Anesthesia Care

## 2020-08-31 MED ORDER — PHENYLEPHRINE HCL-NACL 20-0.9 MG/250ML-% IV SOLN
INTRAVENOUS | Status: DC | PRN
Start: 2020-08-31 — End: 2020-08-31
  Administered 2020-08-31: 25 ug/min via INTRAVENOUS

## 2020-08-31 MED ORDER — SODIUM CHLORIDE 0.9 % IV SOLN: INTRAVENOUS | Status: DC

## 2020-08-31 MED ORDER — LACTATED RINGERS IV SOLN: INTRAVENOUS | Status: DC

## 2020-08-31 MED ORDER — LIDOCAINE HCL (CARDIAC) PF 100 MG/5ML IV SOSY
PREFILLED_SYRINGE | INTRAVENOUS | Status: DC | PRN
  Administered 2020-08-31: 25 mg via INTRAVENOUS

## 2020-08-31 MED ORDER — FLUCONAZOLE 100 MG PO TABS: ORAL_TABLET | ORAL | 0 refills | Status: AC

## 2020-08-31 MED ORDER — PROPOFOL 500 MG/50ML IV EMUL
INTRAVENOUS | Status: DC | PRN
  Administered 2020-08-31: 250 ug/kg/min via INTRAVENOUS

## 2020-08-31 SURGICAL SUPPLY — 14 items
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
## 2020-08-31 NOTE — Anesthesia Procedure Notes (Signed)
Procedure Name: MAC Date/Time: 08/31/2020 2:08 PM Performed by: Lissa Morales, CRNA Pre-anesthesia Checklist: Patient identified, Emergency Drugs available, Suction available and Patient being monitored Patient Re-evaluated:Patient Re-evaluated prior to induction Oxygen Delivery Method: Simple face mask Placement Confirmation: positive ETCO2

## 2020-08-31 NOTE — Op Note (Signed)
[facility] Patient Name: Tonya Harper Procedure Date: 08/31/2020 MRN: [id number] Attending MD: Docia Chuck. Henrene Pastor , MD Date of Birth: 1945/08/28 CSN: [id number] Age: 75 Admit Type: Outpatient Procedure:                Upper GI endoscopy with biopsy; Savary dilation?"12,                            12.8 mm Indications:              Dysphagia Providers:                Docia Chuck. Henrene Pastor, MD, Jerene Pitch Person, Tyna Jaksch                            Technician Referring MD:             Dr. Vertis Kelch (PCP) Medicines:                Monitored Anesthesia Care Complications:            No immediate complications. Estimated Blood Loss:     Estimated blood loss: none. Procedure:                Pre-Anesthesia Assessment:                           - Prior to the procedure, a History and Physical                            was performed, and patient medications and                            allergies were reviewed. The patient's tolerance of                            previous anesthesia was also reviewed. The risks                            and benefits of the procedure and the sedation                            options and risks were discussed with the patient.                            All questions were answered, and informed consent                            was obtained. Prior Anticoagulants: The patient has                            taken Eliquis (apixaban), last dose was 3 days                            prior to procedure. ASA Grade Assessment: III - A  patient with severe systemic disease. After                            reviewing the risks and benefits, the patient was                            deemed in satisfactory condition to undergo the                            procedure.                           After obtaining informed consent, the endoscope was                            passed under direct vision. Throughout the                             procedure, the patient's blood pressure, pulse, and                            oxygen saturations were monitored continuously. The                            [id number] Olympus endoscope was introduced                            through the mouth, and advanced to the second part                            of duodenum. The upper GI endoscopy was                            accomplished without difficulty. The patient                            tolerated the procedure well. Scope In: Scope Out: Findings:      The esophagus revealed high-grade stricturing in the most proximal       portion. This measured about 9 mm. The scope passed beyond with       resistance. There was exudate throughout the proximal esophagus       consistent with candidiasis. There were also multiple small       pseudodiverticulum (as seen with chronic candidiasis). The previously       noted nodule at 35 cm was again biopsied with a cold forceps for       histology. The endoscope could not pass beyond stricturing at 35 cm      Multiple benign-appearing, intrinsic severe stenoses were found       throughout. A guidewire was placed under fluoroscopic guidance into the       gastric antrum and the scope was withdrawn. Dilation was performed with       a Savary dilator with moderate resistance at 12 mm and 12.8 mm. The       dilation site was examined following endoscope reinsertion and showed  moderate superficial mucosal disruption throughout and improved       esophageal diameter.      The stomach was normal.      The examined duodenum was normal.      The cardia and gastric fundus were normal on retroflexion. Impression:               1. Multilevel high-grade stricturing as described.                            Status post Savary dilation to 12.8 mm                           2. Chronic esophageal candidiasis with active                            exudate and multiple pseudodiverticulum                            3. Inflammatory appearing polyp at 35 cm. Again                            biopsied..                           4. Otherwise normal EGD Moderate Sedation:      none Recommendation:           1. Patient has a contact number available for                            emergencies. The signs and symptoms of potential                            delayed complications were discussed with the                            patient. Return to normal activities tomorrow.                            Written discharge instructions were provided to the                            patient.                           2. Post dilation diet.                           3. Continue present medications except Eliquis.                           4. RESUME ELIQUIS TOMORROW                           5. Await pathology results.                           6. Prescribe Diflucan 200  mg on day 1 then 100 mg                            daily for 1 week                           7. Follow-up with Dr. Hilarie Fredrickson. Patient should have                            repeat endoscopy in about 4 weeks for reassessment                            of the esophagus and strong consideration of repeat                            dilation                           Findings and plans were discussed with the patient                            and her family. This information has also been                            shared with Dr. Hilarie Fredrickson Procedure Code(s):        --- Professional ---                           [id number], Esophagogastroduodenoscopy, flexible,                            transoral; with insertion of guide wire followed by                            passage of dilator(s) through esophagus over guide                            wire Diagnosis Code(s):        --- Professional ---                           K22.2, Esophageal obstruction                           R13.10, Dysphagia, unspecified CPT copyright 2019 American Medical  Association. All rights reserved. The codes documented in this report are preliminary and upon coder review may  be revised to meet current compliance requirements. Docia Chuck. Henrene Pastor, MD 08/31/2020 11:33:05 AM This report has been signed electronically. Number of Addenda: 0

## 2020-08-31 NOTE — Anesthesia Postprocedure Evaluation (Signed)
Anesthesia Post Note  Patient: Tonya Harper  Procedure(s) Performed: ESOPHAGOGASTRODUODENOSCOPY (EGD) WITH PROPOFOL SAVORY DILATION BIOPSY     Patient location during evaluation: PACU Anesthesia Type: MAC Level of consciousness: awake and alert, patient cooperative and oriented Pain management: pain level controlled Vital Signs Assessment: post-procedure vital signs reviewed and stable Respiratory status: spontaneous breathing, nonlabored ventilation and respiratory function stable Cardiovascular status: blood pressure returned to baseline and stable Postop Assessment: no apparent nausea or vomiting Anesthetic complications: no   No notable events documented.  Last Vitals:  Vitals:   08/31/20 1135 08/31/20 1140  BP: (!) 100/45 (!) 113/46  Pulse: 60 61  Resp: (!) 22 (!) 21  Temp:    SpO2: 100% 100%    Last Pain:  Vitals:   08/31/20 1140  TempSrc:   PainSc: 0-No pain                 [name],[name]

## 2020-08-31 NOTE — Transfer of Care (Signed)
Immediate Anesthesia Transfer of Care Note  Patient: Tonya Harper  Procedure(s) Performed: ESOPHAGOGASTRODUODENOSCOPY (EGD) WITH PROPOFOL SAVORY DILATION BIOPSY  Patient Location: PACU  Anesthesia Type:MAC  Level of Consciousness: sedated and patient cooperative  Airway & Oxygen Therapy: Patient Spontanous Breathing and Patient connected to face mask oxygen  Post-op Assessment: Report given to RN and Post -op Vital signs reviewed and stable  Post vital signs: stable  Last Vitals:  Vitals Value Taken Time  BP 94/43 08/31/20 1130  Temp 35.8 C 08/31/20 1128  Pulse 59 08/31/20 1131  Resp 15 08/31/20 1131  SpO2 100 % 08/31/20 1131  Vitals shown include unvalidated device data.  Last Pain:  Vitals:   08/31/20 1128  TempSrc: Temporal  PainSc: Asleep         Complications: No notable events documented.

## 2020-08-31 NOTE — Anesthesia Preprocedure Evaluation (Addendum)
Anesthesia Evaluation  Patient identified by MRN, date of birth, ID band Patient awake    Reviewed: Allergy & Precautions, NPO status , Patient's Chart, lab work & pertinent test results, reviewed documented beta blocker date and time   History of Anesthesia Complications Negative for: history of anesthetic complications  Airway Mallampati: I  TM Distance: >3 FB Neck ROM: Full    Dental  (+) Edentulous Upper, Edentulous Lower   Pulmonary COPD (intermittent supplemental O2),  COPD inhaler, former smoker,    breath sounds clear to auscultation       Cardiovascular hypertension, Pt. on medications and Pt. on home beta blockers (-) angina+ CAD, + Past MI and + Peripheral Vascular Disease   Rhythm:Regular Rate:Normal  '20 cath Mid LAD lesion is 25% stenosed. Mid Cx lesion is 100% stenosed. Left to left collaterals. Mid RCA lesion is 100% stenosed. Left to right collaterals. Dist LM lesion is 25% stenosed. There is mild left ventricular systolic dysfunction. The left ventricular ejection fraction is 45-50%  '20 ECHO: EF 45-50%. LV has mildly decreased function,  no LVH.  Grade I DD, mild-mod MR, mild AI, mild TR   Neuro/Psych negative neurological ROS     GI/Hepatic Neg liver ROS, hiatal hernia, GERD  Controlled,Esophageal stricture   Endo/Other  Hypothyroidism   Renal/GU Renal InsufficiencyRenal disease     Musculoskeletal   Abdominal   Peds  Hematology eliquis    Anesthesia Other Findings   Reproductive/Obstetrics                            Anesthesia Physical Anesthesia Plan  ASA: 3  Anesthesia Plan: MAC   Post-op Pain Management:    Induction:   PONV Risk Score and Plan: 2 and Ondansetron and Treatment may vary due to age or medical condition  Airway Management Planned: Natural Airway and Nasal Cannula  Additional Equipment: None  Intra-op Plan:   Post-operative Plan:    Informed Consent: I have reviewed the patients History and Physical, chart, labs and discussed the procedure including the risks, benefits and alternatives for the proposed anesthesia with the patient or authorized representative who has indicated his/her understanding and acceptance.       Plan Discussed with: CRNA and Surgeon  Anesthesia Plan Comments:        Anesthesia Quick Evaluation

## 2020-08-31 NOTE — Telephone Encounter (Signed)
  The prescription has been sent to the pharmacy as requested.     ----- Message -----  From: Irene Shipper, MD  Sent: 08/31/2020  12:33 PM EDT  To: Timothy Lasso, RN, Jerene Bears, MD   Sorry.  Tonya Harper.  Date of birth 11-Jul-1945.  (On my hospital-schedule today).  Thanks  JP   ----- Message -----  From: Timothy Lasso, RN  Sent: 08/31/2020  12:05 PM EDT  To: Irene Shipper, MD   Dr Henrene Pastor there is no patient attached.    ----- Message -----  From: Irene Shipper, MD  Sent: 08/31/2020  11:42 AM EDT  To: [name], RN, Jerene Bears, MD   [name],  Please see endoscopy report.  High-grade esophageal strictures at multiple levels.  Candidiasis (likely chronic as she has multiple small pseudodiverticula).  Dilated off of her blood thinner with Savary dilator up to 12.8 mm.  Also, previously noted benign nodule was again biopsied.  I think she should have repeat endoscopy with probable esophageal dilation in about 4 weeks.  I will leave it to you.  Thanks.  JP   [name] (our nurse covering for [place]),  Please call in Diflucan 100 mg; #8; no refills; take 2 on day 1 by mouth then take 1 daily by mouth until completed  Thanks,  Dr. Henrene Pastor

## 2020-08-31 NOTE — Discharge Instructions (Signed)
YOU HAD AN ENDOSCOPIC PROCEDURE TODAY: Refer to the procedure report and other information in the discharge instructions given to you for any specific questions about what was found during the examination. If this information does not answer your questions, please call [place] office at [phone] to clarify.  ° °YOU SHOULD EXPECT: Some feelings of bloating in the abdomen. Passage of more gas than usual. Walking can help get rid of the air that was put into your GI tract during the procedure and reduce the bloating. If you had a lower endoscopy (such as a colonoscopy or flexible sigmoidoscopy) you may notice spotting of blood in your stool or on the toilet paper. Some abdominal soreness may be present for a day or two, also. ° °DIET: Your first meal following the procedure should be a light meal and then it is ok to progress to your normal diet. A half-sandwich or bowl of soup is an example of a good first meal. Heavy or fried foods are harder to digest and may make you feel nauseous or bloated. Drink plenty of fluids but you should avoid alcoholic beverages for 24 hours. If you had a esophageal dilation, please see attached instructions for diet.   ° °ACTIVITY: Your care partner should take you home directly after the procedure. You should plan to take it easy, moving slowly for the rest of the day. You can resume normal activity the day after the procedure however YOU SHOULD NOT DRIVE, use power tools, machinery or perform tasks that involve climbing or major physical exertion for 24 hours (because of the sedation medicines used during the test).  ° °SYMPTOMS TO REPORT IMMEDIATELY: °A gastroenterologist can be reached at any hour. Please call [phone]  for any of the following symptoms:  °Following lower endoscopy (colonoscopy, flexible sigmoidoscopy) °Excessive amounts of blood in the stool  °Significant tenderness, worsening of abdominal pains  °Swelling of the abdomen that is new, acute  °Fever of 100° or  higher  °Following upper endoscopy (EGD, EUS, ERCP, esophageal dilation) °Vomiting of blood or coffee ground material  °New, significant abdominal pain  °New, significant chest pain or pain under the shoulder blades  °Painful or persistently difficult swallowing  °New shortness of breath  °Black, tarry-looking or red, bloody stools ° °FOLLOW UP:  °If any biopsies were taken you will be contacted by phone or by letter within the next 1-3 weeks. Call [phone]  if you have not heard about the biopsies in 3 weeks.  °Please also call with any specific questions about appointments or follow up tests. ° °

## 2020-09-01 ENCOUNTER — Encounter (HOSPITAL_COMMUNITY): Payer: Self-pay | Admitting: Internal Medicine

## 2020-09-04 DIAGNOSIS — E43 Unspecified severe protein-calorie malnutrition: Secondary | ICD-10-CM | POA: Diagnosis not present

## 2020-09-04 DIAGNOSIS — E890 Postprocedural hypothyroidism: Secondary | ICD-10-CM | POA: Diagnosis not present

## 2020-09-04 DIAGNOSIS — I5022 Chronic systolic (congestive) heart failure: Secondary | ICD-10-CM | POA: Diagnosis not present

## 2020-09-04 DIAGNOSIS — I2510 Atherosclerotic heart disease of native coronary artery without angina pectoris: Secondary | ICD-10-CM | POA: Diagnosis not present

## 2020-09-04 DIAGNOSIS — E041 Nontoxic single thyroid nodule: Secondary | ICD-10-CM | POA: Diagnosis not present

## 2020-09-04 DIAGNOSIS — J9611 Chronic respiratory failure with hypoxia: Secondary | ICD-10-CM | POA: Diagnosis not present

## 2020-09-04 DIAGNOSIS — J449 Chronic obstructive pulmonary disease, unspecified: Secondary | ICD-10-CM | POA: Diagnosis not present

## 2020-09-04 DIAGNOSIS — M158 Other polyosteoarthritis: Secondary | ICD-10-CM | POA: Diagnosis not present

## 2020-09-04 DIAGNOSIS — M810 Age-related osteoporosis without current pathological fracture: Secondary | ICD-10-CM | POA: Diagnosis not present

## 2020-09-04 DIAGNOSIS — I10 Essential (primary) hypertension: Secondary | ICD-10-CM | POA: Diagnosis not present

## 2020-09-04 DIAGNOSIS — I255 Ischemic cardiomyopathy: Secondary | ICD-10-CM | POA: Diagnosis not present

## 2020-09-04 DIAGNOSIS — D638 Anemia in other chronic diseases classified elsewhere: Secondary | ICD-10-CM | POA: Diagnosis not present

## 2020-09-04 LAB — SURGICAL PATHOLOGY

## 2020-09-07 ENCOUNTER — Other Ambulatory Visit: Payer: Self-pay

## 2020-09-07 DIAGNOSIS — R1310 Dysphagia, unspecified: Secondary | ICD-10-CM

## 2020-09-07 DIAGNOSIS — K222 Esophageal obstruction: Secondary | ICD-10-CM

## 2020-09-07 NOTE — Addendum Note (Signed)
Addended by: Yevette Edwards on: 09/07/2020 12:51 PM   Modules accepted: Orders

## 2020-09-07 NOTE — Telephone Encounter (Signed)
Patient has been scheduled for repeat EGD with possible dilation at [facility] with Dr. Silverio Decamp on Tuesday, 10/03/20 at 9:15 am. Patient will need to arrive at 7:45 am with a care partner. Patient will need to hold Eliquis 2 days prior to EGD. I have reviewed appt information with patient, she is aware that I am placing a copy of the instructions in the mail for her. Pt did confirm address on file. Patient verbalized understanding of all information and had no concerns at the end of the call.  Ambulatory referral to GI in epic.

## 2020-09-07 NOTE — Telephone Encounter (Signed)
Pyrtle, Lajuan Lines, MD  Irene Shipper, MD; [name], RN Thanks Barbette Merino, this is for patient Tonya Harper we will need to get her scheduled for me in 4 weeks at [facility] for repeat dil off blood thinners.  JMP

## 2020-09-11 DIAGNOSIS — I10 Essential (primary) hypertension: Secondary | ICD-10-CM | POA: Diagnosis not present

## 2020-09-11 DIAGNOSIS — E890 Postprocedural hypothyroidism: Secondary | ICD-10-CM | POA: Diagnosis not present

## 2020-09-11 DIAGNOSIS — E010 Iodine-deficiency related diffuse (endemic) goiter: Secondary | ICD-10-CM | POA: Diagnosis not present

## 2020-09-14 ENCOUNTER — Ambulatory Visit: Payer: Medicare Other | Admitting: Gastroenterology

## 2020-09-14 ENCOUNTER — Telehealth: Payer: Self-pay

## 2020-09-14 NOTE — Telephone Encounter (Signed)
Patient missed appt today with Janett Billow and asked to speak with me. Advised that her appt today was for a hospital follow up. Patient states that she completed her Diflucan prescription and is doing fine. Advised patient to let us know if she has any issues prior to her EGD on 10/03/20. Advised that the provider will further advise on any recommendations after procedure. Patient and her son verbalized understanding and had no concerns at the end of the call.

## 2020-09-26 ENCOUNTER — Other Ambulatory Visit: Payer: Self-pay

## 2020-10-03 ENCOUNTER — Other Ambulatory Visit: Payer: Self-pay

## 2020-10-03 ENCOUNTER — Ambulatory Visit (HOSPITAL_COMMUNITY): Payer: Medicare Other | Admitting: Anesthesiology

## 2020-10-03 ENCOUNTER — Encounter (HOSPITAL_COMMUNITY): Payer: Self-pay | Admitting: Gastroenterology

## 2020-10-03 ENCOUNTER — Encounter (HOSPITAL_COMMUNITY)
Admission: RE | Disposition: A | Discharge status: Home / Self Care | Payer: Self-pay | Source: Home / Self Care | Attending: Gastroenterology

## 2020-10-03 ENCOUNTER — Ambulatory Visit (HOSPITAL_COMMUNITY)
Admission: RE | Admit: 2020-10-03 | Discharge: 2020-10-03 | Disposition: A | Discharge status: Home / Self Care | Payer: Medicare Other | Attending: Gastroenterology | Admitting: Gastroenterology

## 2020-10-03 DIAGNOSIS — Z79890 Hormone replacement therapy (postmenopausal): Secondary | ICD-10-CM | POA: Diagnosis not present

## 2020-10-03 DIAGNOSIS — Z7901 Long term (current) use of anticoagulants: Secondary | ICD-10-CM | POA: Insufficient documentation

## 2020-10-03 DIAGNOSIS — D130 Benign neoplasm of esophagus: Secondary | ICD-10-CM | POA: Diagnosis not present

## 2020-10-03 DIAGNOSIS — Z7951 Long term (current) use of inhaled steroids: Secondary | ICD-10-CM | POA: Insufficient documentation

## 2020-10-03 DIAGNOSIS — Z887 Allergy status to serum and vaccine status: Secondary | ICD-10-CM | POA: Insufficient documentation

## 2020-10-03 DIAGNOSIS — K222 Esophageal obstruction: Secondary | ICD-10-CM

## 2020-10-03 DIAGNOSIS — Z87891 Personal history of nicotine dependence: Secondary | ICD-10-CM | POA: Diagnosis not present

## 2020-10-03 DIAGNOSIS — B3781 Candidal esophagitis: Secondary | ICD-10-CM | POA: Diagnosis not present

## 2020-10-03 DIAGNOSIS — Z886 Allergy status to analgesic agent status: Secondary | ICD-10-CM | POA: Diagnosis not present

## 2020-10-03 DIAGNOSIS — B9781 Human metapneumovirus as the cause of diseases classified elsewhere: Secondary | ICD-10-CM | POA: Diagnosis not present

## 2020-10-03 DIAGNOSIS — Z79899 Other long term (current) drug therapy: Secondary | ICD-10-CM | POA: Diagnosis not present

## 2020-10-03 DIAGNOSIS — K2289 Other specified disease of esophagus: Secondary | ICD-10-CM | POA: Insufficient documentation

## 2020-10-03 DIAGNOSIS — K2101 Gastro-esophageal reflux disease with esophagitis, with bleeding: Secondary | ICD-10-CM

## 2020-10-03 DIAGNOSIS — Z91013 Allergy to seafood: Secondary | ICD-10-CM | POA: Diagnosis not present

## 2020-10-03 DIAGNOSIS — R1310 Dysphagia, unspecified: Secondary | ICD-10-CM | POA: Diagnosis not present

## 2020-10-03 DIAGNOSIS — Z888 Allergy status to other drugs, medicaments and biological substances status: Secondary | ICD-10-CM | POA: Diagnosis not present

## 2020-10-03 HISTORY — PX: BALLOON DILATION: SHX5330

## 2020-10-03 HISTORY — PX: ESOPHAGOGASTRODUODENOSCOPY (EGD) WITH PROPOFOL: SHX5813

## 2020-10-03 HISTORY — PX: BIOPSY: SHX5522

## 2020-10-03 SURGERY — ESOPHAGOGASTRODUODENOSCOPY (EGD) WITH PROPOFOL
Anesthesia: Monitor Anesthesia Care

## 2020-10-03 MED ORDER — LIDOCAINE 2% (20 MG/ML) 5 ML SYRINGE
INTRAMUSCULAR | Status: DC | PRN
  Administered 2020-10-03: 40 mg via INTRAVENOUS

## 2020-10-03 MED ORDER — SODIUM CHLORIDE 0.9 % IV SOLN: INTRAVENOUS | Status: DC

## 2020-10-03 MED ORDER — FLUCONAZOLE 100 MG PO TABS: 100.0000 mg | ORAL_TABLET | Freq: Every day | ORAL | 0 refills | Status: AC

## 2020-10-03 MED ORDER — PROPOFOL 10 MG/ML IV BOLUS
INTRAVENOUS | Status: DC | PRN
  Administered 2020-10-03 (×4): 20 mg via INTRAVENOUS
  Administered 2020-10-03: 40 mg via INTRAVENOUS
  Administered 2020-10-03: 20 mg via INTRAVENOUS

## 2020-10-03 MED ORDER — PANTOPRAZOLE SODIUM 40 MG PO TBEC: 40.0000 mg | DELAYED_RELEASE_TABLET | Freq: Two times a day (BID) | ORAL | 1 refills | Status: DC

## 2020-10-03 MED ORDER — PHENYLEPHRINE 40 MCG/ML (10ML) SYRINGE FOR IV PUSH (FOR BLOOD PRESSURE SUPPORT)
PREFILLED_SYRINGE | INTRAVENOUS | Status: DC | PRN
  Administered 2020-10-03: 80 ug via INTRAVENOUS

## 2020-10-03 MED ORDER — LACTATED RINGERS IV SOLN: INTRAVENOUS | Status: DC | PRN

## 2020-10-03 MED ORDER — SUCRALFATE 1 G PO TABS: 1.0000 g | ORAL_TABLET | Freq: Four times a day (QID) | ORAL | 0 refills | Status: DC

## 2020-10-03 SURGICAL SUPPLY — 14 items

## 2020-10-03 NOTE — Anesthesia Postprocedure Evaluation (Signed)
Anesthesia Post Note  Patient: Tonya Harper  Procedure(s) Performed: ESOPHAGOGASTRODUODENOSCOPY (EGD) WITH PROPOFOL BALLOON DILATION BIOPSY     Patient location during evaluation: Endoscopy Anesthesia Type: MAC Level of consciousness: awake and alert Pain management: pain level controlled Vital Signs Assessment: post-procedure vital signs reviewed and stable Respiratory status: spontaneous breathing, nonlabored ventilation and respiratory function stable Cardiovascular status: blood pressure returned to baseline and stable Postop Assessment: no apparent nausea or vomiting Anesthetic complications: no   No notable events documented.  Last Vitals:  Vitals:   10/03/20 0952 10/03/20 1001  BP: (!) 90/41 (!) 130/54  Pulse: 65 64  Resp: 17 17  Temp:    SpO2: 100% 100%    Last Pain:  Vitals:   10/03/20 1001  TempSrc:   PainSc: 0-No pain                 Lidia Collum

## 2020-10-03 NOTE — Discharge Instructions (Signed)
YOU HAD AN ENDOSCOPIC PROCEDURE TODAY: Refer to the procedure report and other information in the discharge instructions given to you for any specific questions about what was found during the examination. If this information does not answer your questions, please call [place] office at [phone] to clarify.   YOU SHOULD EXPECT: Some feelings of bloating in the abdomen. Passage of more gas than usual. Walking can help get rid of the air that was put into your GI tract during the procedure and reduce the bloating. If you had a lower endoscopy (such as a colonoscopy or flexible sigmoidoscopy) you may notice spotting of blood in your stool or on the toilet paper. Some abdominal soreness may be present for a day or two, also.  DIET: Your first meal following the procedure should be a light meal and then it is ok to progress to your normal diet. A half-sandwich or bowl of soup is an example of a good first meal. Heavy or fried foods are harder to digest and may make you feel nauseous or bloated. Drink plenty of fluids but you should avoid alcoholic beverages for 24 hours. If you had a esophageal dilation, please see attached instructions for diet.    ACTIVITY: Your care partner should take you home directly after the procedure. You should plan to take it easy, moving slowly for the rest of the day. You can resume normal activity the day after the procedure however YOU SHOULD NOT DRIVE, use power tools, machinery or perform tasks that involve climbing or major physical exertion for 24 hours (because of the sedation medicines used during the test).   SYMPTOMS TO REPORT IMMEDIATELY: A gastroenterologist can be reached at any hour. Please call [phone]  for any of the following symptoms:   Following upper endoscopy (EGD, EUS, ERCP, esophageal dilation) Vomiting of blood or coffee ground material  New, significant abdominal pain  New, significant chest pain or pain under the shoulder blades  Painful or  persistently difficult swallowing  New shortness of breath  Black, tarry-looking or red, bloody stools  FOLLOW UP:  If any biopsies were taken you will be contacted by phone or by letter within the next 1-3 weeks. Call [phone]  if you have not heard about the biopsies in 3 weeks.  Please also call with any specific questions about appointments or follow up tests.  
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
## 2020-10-03 NOTE — Op Note (Signed)
[facility] Patient Name: Tonya Harper Procedure Date: 10/03/2020 MRN: [id number] Attending MD: Mauri Pole , MD Date of Birth: 1945/12/31 CSN: [id number] Age: 75 Admit Type: Outpatient Procedure:                Upper GI endoscopy Indications:              Dysphagia, For therapy of esophageal stricture Providers:                Mauri Pole, MD, Particia Nearing, RN, Hinton Dyer Referring MD:              Medicines:                Monitored Anesthesia Care Complications:            No immediate complications. Estimated Blood Loss:     Estimated blood loss was minimal. Procedure:                Pre-Anesthesia Assessment:                           - Prior to the procedure, a History and Physical                            was performed, and patient medications and                            allergies were reviewed. The patient's tolerance of                            previous anesthesia was also reviewed. The risks                            and benefits of the procedure and the sedation                            options and risks were discussed with the patient.                            All questions were answered, and informed consent                            was obtained. Prior Anticoagulants: The patient                            last took Eliquis (apixaban) 2 days prior to the                            procedure. ASA Grade Assessment: IV - A patient                            with severe systemic disease that is a constant  threat to life. After reviewing the risks and                            benefits, the patient was deemed in satisfactory                            condition to undergo the procedure.                           After obtaining informed consent, the endoscope was                            passed under direct vision. Throughout the                            procedure, the  patient's blood pressure, pulse, and                            oxygen saturations were monitored continuously. The                            [id number] Olympus endoscope was introduced                            through the mouth, and advanced to the second part                            of duodenum. The upper GI endoscopy was technically                            difficult and complex due to stricture. The patient                            tolerated the procedure well. Scope In: Scope Out: Findings:      A few benign-appearing, intrinsic severe (stenosis; an endoscope cannot       pass beyond 36cm) stenoses were found 35 to 40 cm from the incisors. The       narrowest stenosis measured 8-9 mm (inner diameter). The stenoses were       traversed after dilation. A TTS dilator was passed through the scope.       Dilation with a 11-08-10 mm x 8 cm CRE balloon and a 12-13.5-15 mm x 8       cm CRE balloon dilator was performed to 15 mm. The dilation site was       examined following endoscope reinsertion and showed mild mucosal       disruption and moderate improvement in luminal narrowing.      LA Grade C (one or more mucosal breaks continuous between tops of 2 or       more mucosal folds, less than 75% circumference) esophagitis with       bleeding was found 25 to 40 cm from the incisors. Noted few small pseuod       diverticula. Biopsies were taken with a cold forceps for histology.      A single 5 mm mucosal nodule was  found in the middle third of the       esophagus, 35 cm from the incisors. Biopsies were taken with a cold       forceps for histology.      The stomach was normal.      The cardia and gastric fundus were normal on retroflexion.      The examined duodenum was normal. Impression:               - Benign-appearing esophageal stenoses. Dilated.                           - LA Grade C reflux and candidiasis esophagitis                            with bleeding.  Biopsied.                           - Mucosal nodule found in the esophagus. Biopsied.                           - Normal stomach.                           - Normal examined duodenum. Moderate Sedation:      Not Applicable - Patient had care per Anesthesia. Recommendation:           - Patient has a contact number available for                            emergencies. The signs and symptoms of potential                            delayed complications were discussed with the                            patient. Return to normal activities tomorrow.                            Written discharge instructions were provided to the                            patient.                           - Full liquid diet today, then advance as tolerated                            to soft diet.                           - No aspirin, ibuprofen, naproxen, or other                            non-steroidal anti-inflammatory drugs.                           - Diflucan (fluconazole) 100 mg  PO daily for 2                            weeks.                           - Use sucralfate suspension 1 gram PO QID for 2                            weeks.                           - Use Protonix (pantoprazole) 40 mg PO BID.                           - Follow an antireflux regimen.                           - Continue present medications.                           - Await pathology results.                           - Return to GI office for follow up with Dr.Pyrtle                            at appointment to be scheduled in 4-6 weeks.                           - Resume Eliquis (apixaban) at prior dose in 2                            days. Refer to managing physician for further                            adjustment of therapy. Procedure Code(s):        --- Professional ---                           [phone], Esophagogastroduodenoscopy, flexible,                            transoral; with transendoscopic balloon dilation of                             esophagus (less than 30 mm diameter)                           [id number], 59, Esophagogastroduodenoscopy, flexible,                            transoral; with biopsy, single or multiple Diagnosis Code(s):        --- Professional ---  K22.2, Esophageal obstruction                           K21.01, Gastro-esophageal reflux disease with                            esophagitis, with bleeding                           B37.81, Candidal esophagitis                           K22.8, Other specified diseases of esophagus                           R13.10, Dysphagia, unspecified CPT copyright 2019 American Medical Association. All rights reserved. The codes documented in this report are preliminary and upon coder review may  be revised to meet current compliance requirements. Mauri Pole, MD 10/03/2020 9:55:23 AM This report has been signed electronically. Number of Addenda: 0

## 2020-10-03 NOTE — Transfer of Care (Signed)
Immediate Anesthesia Transfer of Care Note  Patient: Tonya Harper  Procedure(s) Performed: ESOPHAGOGASTRODUODENOSCOPY (EGD) WITH PROPOFOL BALLOON DILATION BIOPSY  Patient Location: PACU  Anesthesia Type:MAC  Level of Consciousness: sedated  Airway & Oxygen Therapy: Patient Spontanous Breathing and Patient connected to nasal cannula oxygen  Post-op Assessment: Report given to RN and Post -op Vital signs reviewed and stable  Post vital signs: Reviewed and stable  Last Vitals:  Vitals Value Taken Time  BP    Temp    Pulse 61 10/03/20 0946  Resp 20 10/03/20 0946  SpO2 100 % 10/03/20 0946  Vitals shown include unvalidated device data.  Last Pain:  Vitals:   10/03/20 0824  TempSrc: Oral  PainSc: 0-No pain         Complications: No notable events documented.

## 2020-10-03 NOTE — Anesthesia Preprocedure Evaluation (Signed)
Anesthesia Evaluation  Patient identified by MRN, date of birth, ID band Patient awake    Reviewed: Allergy & Precautions, NPO status , Patient's Chart, lab work & pertinent test results, reviewed documented beta blocker date and time   History of Anesthesia Complications Negative for: history of anesthetic complications  Airway Mallampati: I  TM Distance: >3 FB Neck ROM: Full    Dental  (+) Edentulous Upper, Edentulous Lower   Pulmonary COPD (intermittent supplemental O2),  COPD inhaler and oxygen dependent, former smoker,    Pulmonary exam normal        Cardiovascular hypertension, Pt. on medications and Pt. on home beta blockers (-) angina+ CAD, + Past MI, + Peripheral Vascular Disease and +CHF  Normal cardiovascular exam Rhythm:Regular Rate:Normal  '20 cath Mid LAD lesion is 25% stenosed. Mid Cx lesion is 100% stenosed. Left to left collaterals. Mid RCA lesion is 100% stenosed. Left to right collaterals. Dist LM lesion is 25% stenosed. There is mild left ventricular systolic dysfunction. The left ventricular ejection fraction is 45-50%  '20 ECHO: EF 45-50%. LV has mildly decreased function,  no LVH.  Grade I DD, mild-mod MR, mild AI, mild TR   Neuro/Psych negative neurological ROS     GI/Hepatic Neg liver ROS, hiatal hernia, GERD  Controlled,Esophageal stricture   Endo/Other  Hypothyroidism   Renal/GU Renal InsufficiencyRenal disease     Musculoskeletal   Abdominal   Peds  Hematology eliquis    Anesthesia Other Findings   Reproductive/Obstetrics                             Anesthesia Physical  Anesthesia Plan  ASA: 4  Anesthesia Plan: MAC   Post-op Pain Management:    Induction:   PONV Risk Score and Plan: 2 and Treatment may vary due to age or medical condition and Propofol infusion  Airway Management Planned: Natural Airway and Nasal Cannula  Additional Equipment:  None  Intra-op Plan:   Post-operative Plan:   Informed Consent: I have reviewed the patients History and Physical, chart, labs and discussed the procedure including the risks, benefits and alternatives for the proposed anesthesia with the patient or authorized representative who has indicated his/her understanding and acceptance.       Plan Discussed with: CRNA and Surgeon  Anesthesia Plan Comments:         Anesthesia Quick Evaluation

## 2020-10-04 ENCOUNTER — Encounter (HOSPITAL_COMMUNITY): Payer: Self-pay | Admitting: Gastroenterology

## 2020-10-04 LAB — SURGICAL PATHOLOGY

## 2020-10-06 ENCOUNTER — Encounter: Payer: Self-pay | Admitting: *Deleted

## 2020-10-18 DIAGNOSIS — G5630 Lesion of radial nerve, unspecified upper limb: Secondary | ICD-10-CM | POA: Diagnosis not present

## 2020-10-18 DIAGNOSIS — M542 Cervicalgia: Secondary | ICD-10-CM | POA: Diagnosis not present

## 2020-10-23 IMAGING — US US RENAL
1 series · 14 of 25 positions shown · non-contrast
Comparison: CT 03/24/2018, CT 03/15/2013

CLINICAL DATA: Chronic kidney disease

EXAM:
RENAL / URINARY TRACT ULTRASOUND COMPLETE

[Series 1: us renal · 14 of 38 slices shown]
[im 1/38]
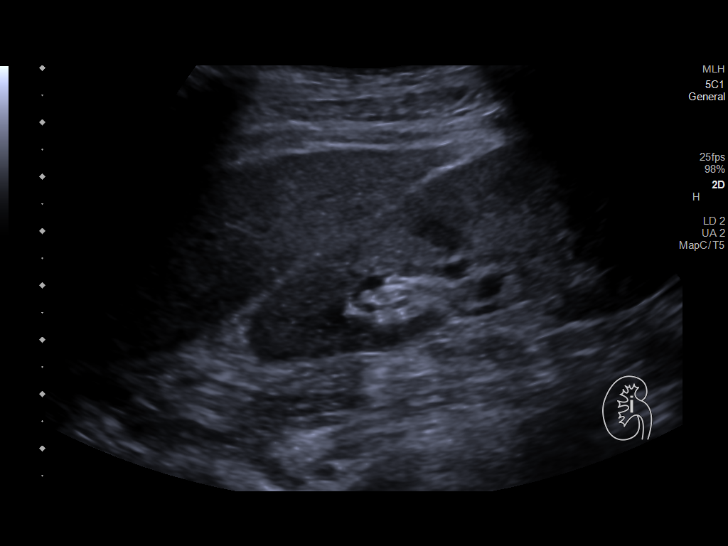
[im 4/38]
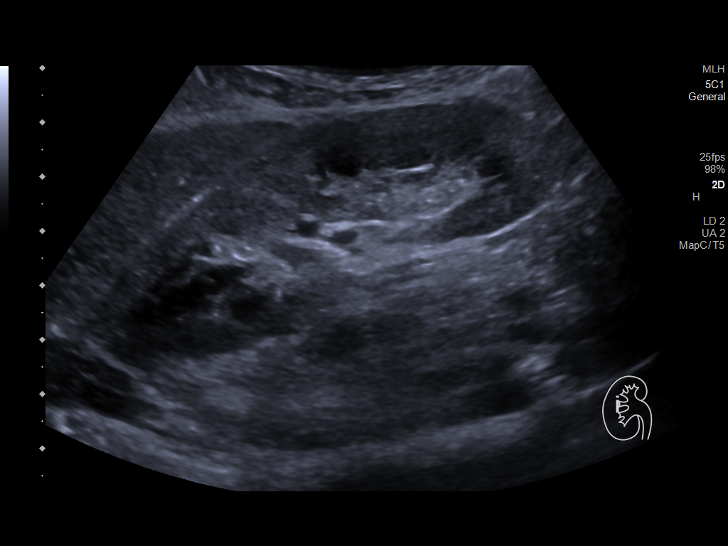
[im 7/38]
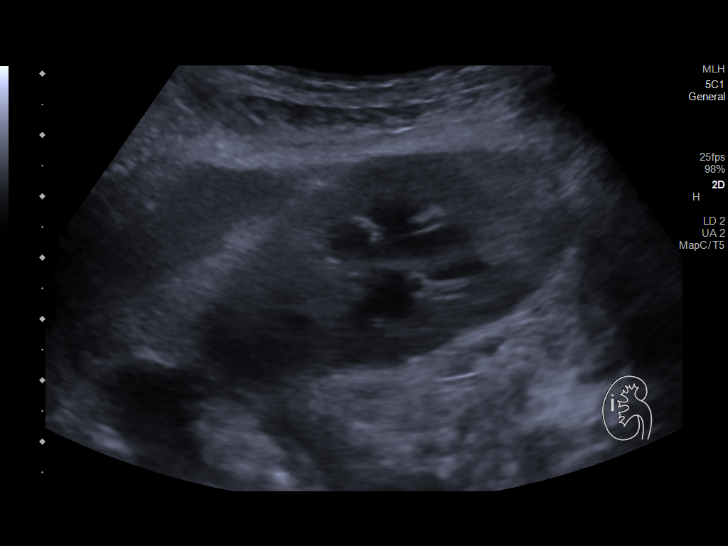
[im 10/38]
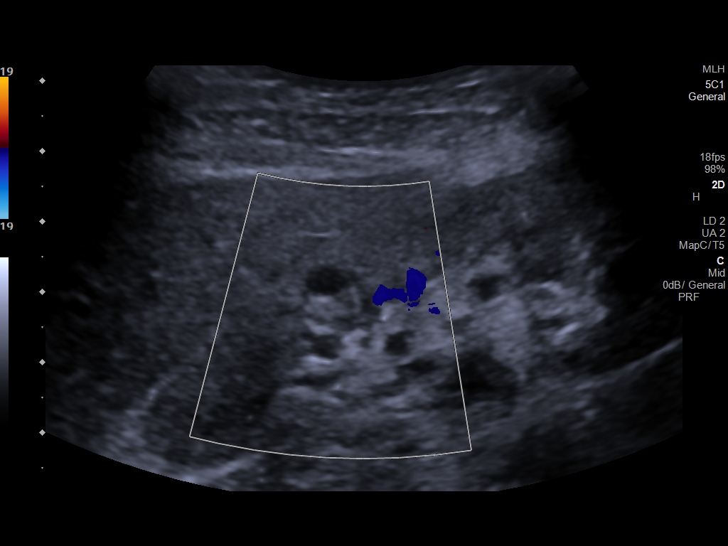
[im 13/38]
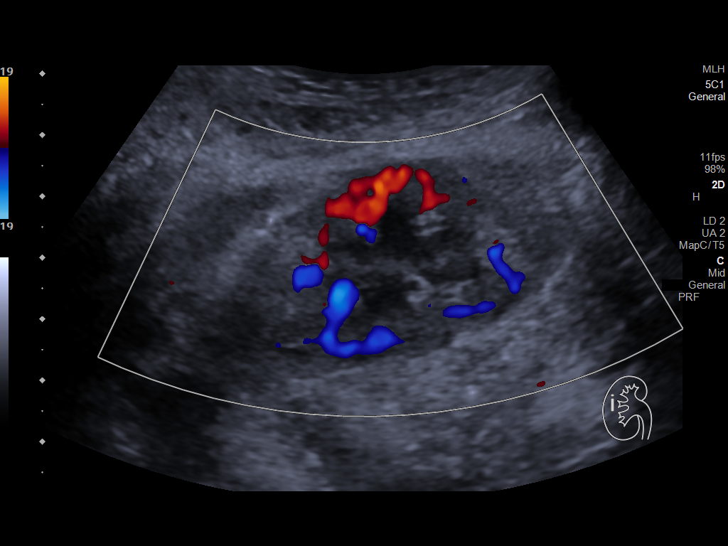
[im 14/38]
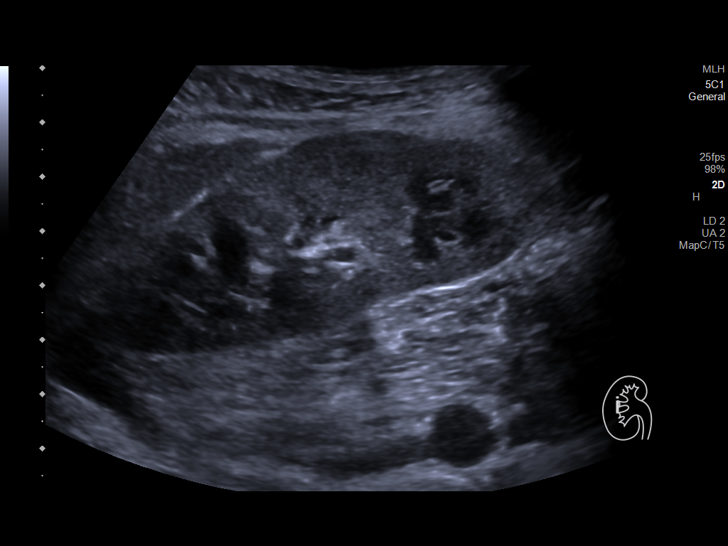
[im 17/38]
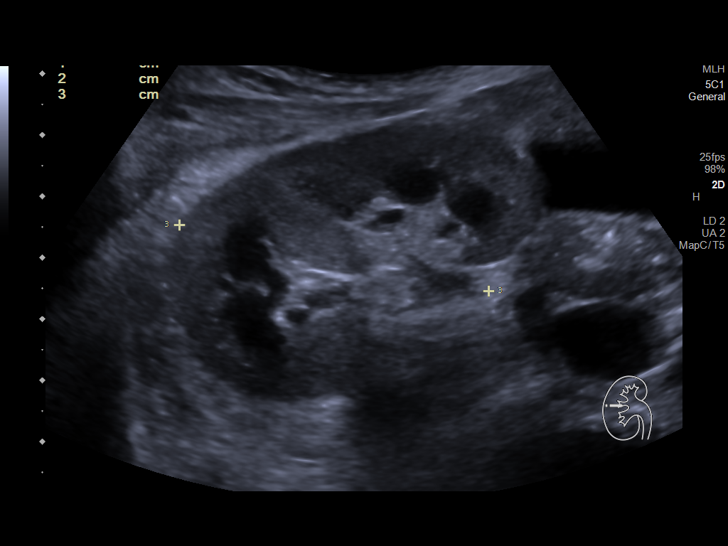
[im 21/38]
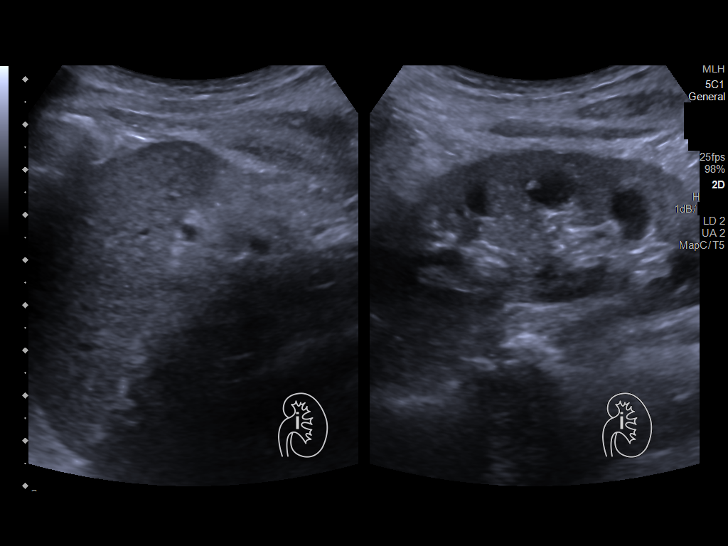
[im 24/38]
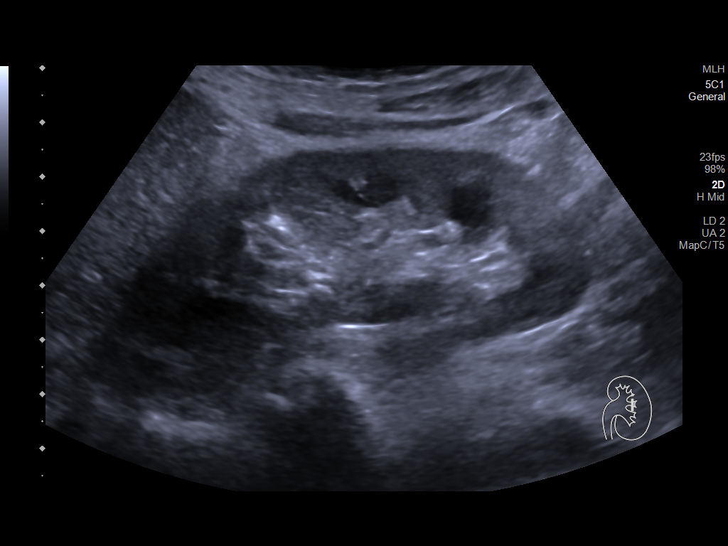
[im 25/38]
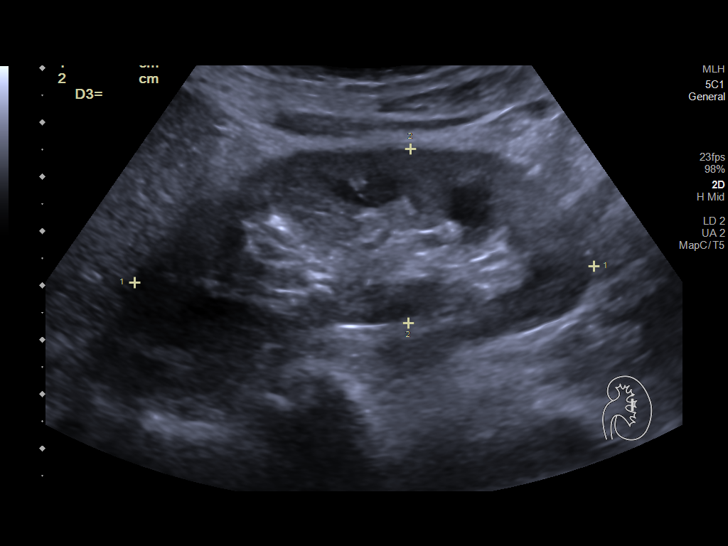
[im 28/38]
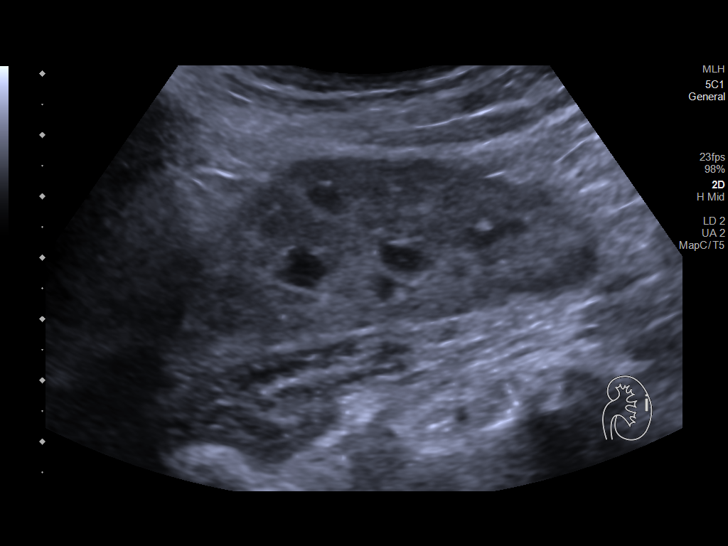
[im 31/38]
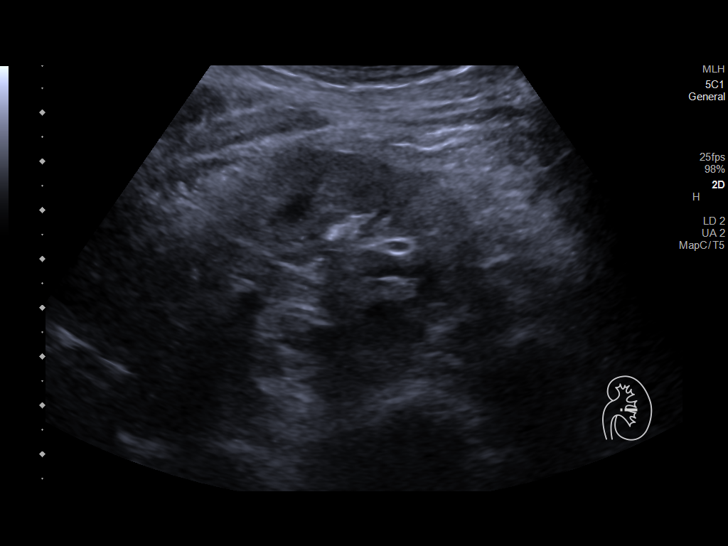
[im 34/38]
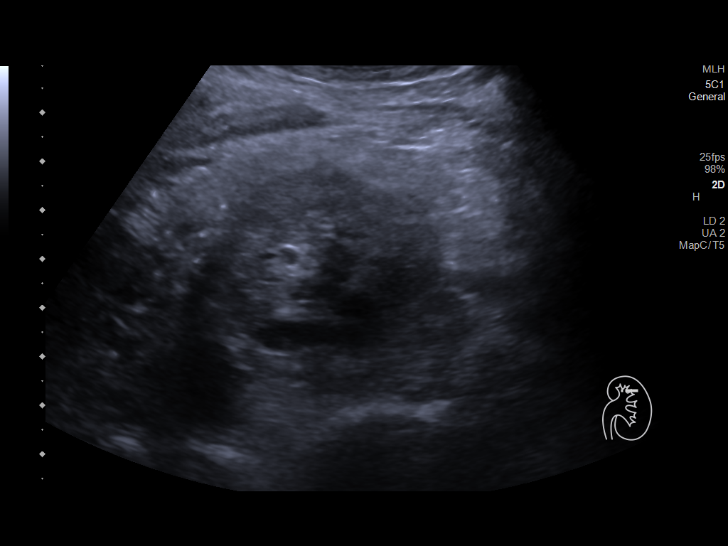
[im 38/38]
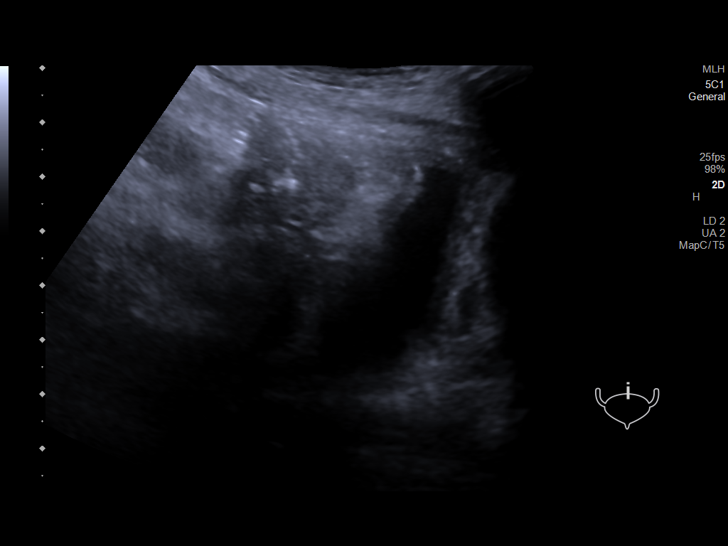

[14 of 25 positions shown; findings below may reference images not displayed]

FINDINGS: Right Kidney:

Renal measurements: 9.2 x 3 x 5.2 cm = volume: 73 mL. Cortical
echogenicity is normal. No hydronephrosis. 5 mm cyst in the upper
pole. Mildly prominent right extrarenal pelvis.

Left Kidney:

Renal measurements: 8.5 x 3.6 x 4.4 cm = volume: 70 mL. Echogenicity
within normal limits. No mass or hydronephrosis visualized.

Bladder:

Appears normal for degree of bladder distention.

Other:

None.
IMPRESSION: 1. Small 5 mm simple cyst right kidney.
2. The kidneys are otherwise unremarkable.

## 2020-10-25 DIAGNOSIS — G5630 Lesion of radial nerve, unspecified upper limb: Secondary | ICD-10-CM | POA: Diagnosis not present

## 2020-10-30 ENCOUNTER — Other Ambulatory Visit: Payer: Self-pay | Admitting: Gastroenterology

## 2020-11-02 ENCOUNTER — Telehealth: Payer: Self-pay | Admitting: Primary Care

## 2020-11-02 NOTE — Telephone Encounter (Signed)
Recommend Ov for further evaluation -not seen for >6 months  Seen by Volanda Napoleon NP last   Please contact office for sooner follow up if symptoms do not improve or worsen or seek emergency care

## 2020-11-02 NOTE — Telephone Encounter (Signed)
Called and spoke with patient. She stated that she does not have transportation to our office nor has anyone that can bring her to the office tomorrow. I advised her that I could get scheduled for a televisit due to her circumstances. She verbalized understanding.   She has been scheduled for a televisit on 11/03/20 at 1030am with Beth.   Nothing further needed at time of call.

## 2020-11-02 NOTE — Telephone Encounter (Signed)
Primary Pulmonologist: N/A, previous [facility] patient  Last office visit and with whom: 04/25/20 What do we see them for (pulmonary problems): COPD Last OV assessment/plan: Chronic obstructive pulmonary disease ([place]) - COPD GOLD C. Spirometry last done in 2013, FEV 38%. She has no acute COPD symptoms today. Chronic dyspnea on exertion. Denies cough or mucus production. CAT 17.  - CXR today showed hyperinflated lungs with emphysematous changes. No acute findings.  - Continue Trelegy 100, one puff daily (rinse mouth after use) - Recommending pulmonary rehab, patient will consider  - Needs repeat PFTs  - FU in 3 months with new [facility] pulmonary MD    Former smoker - Patient quit in 2013, 20 pack year hx - Discussed referral to lung cancer screening program, patient will consider    Anemia - Patient reports fatigue, Hgb 10.1 - She has an apt tomorrow with her PCP/ Dr. Verdon Cummins, NP 04/25/2020  Was appointment offered to patient (explain)?  Patient wanted recommendations first.    Reason for call: Called and spoke with patient. She stated that she developed a deep cough on Monday. She has not been able to cough up any of the phlegm due to how thick it is. At times, she feels like she is getting choked from the mucus. She only has the increase in SOB during the severe coughing spells. She denied any fevers, chills or being around anyone who has been sick recently.   She stated that the only OTC medication she has taken was Tylenol.   She is still using her Trelegy 163mcg inhaler as well as the albuterol HFA as needed.   (examples of things to ask: : When did symptoms start? Fever? Cough? Productive? Color to sputum? More sputum than usual? Wheezing? Have you needed increased oxygen? Are you taking your respiratory medications? What over the counter measures have you tried?)  Allergies  Allergen Reactions   Aspirin Swelling    nausea and dizziness after taking for one  month at a time.. Tolerates the 81 mg    Pneumovax [Pneumococcal Polysaccharide Vaccine] Hives and Swelling   Shellfish Allergy Anaphylaxis and Swelling    Medication withdrawal symptoms     Lipitor [Atorvastatin Calcium] Other (See Comments)    Recurrent myalgia with lipitor   Ibuprofen Nausea And Vomiting    dizziness   Tdap [Tetanus-Diphth-Acell Pertussis] Swelling and Rash    Immunization History  Administered Date(s) Administered   Influenza Split 12/08/2010, 11/13/2011, 11/06/2016, 11/28/2017, 12/17/2019   Influenza, High Dose Seasonal PF 11/10/2017, 11/05/2018   Influenza,inj,Quad PF,6+ Mos 10/01/2012, 10/20/2013, 11/03/2014, 12/06/2015   Influenza-Unspecified 09/29/2014   PFIZER(Purple Top)SARS-COV-2 Vaccination 02/19/2019, 03/12/2019, 12/17/2019   Pneumococcal Polysaccharide-23 06/11/2011   Tdap 06/11/2011   Zoster, Live 06/11/2011   Pharmacy is [facility] in [place].   TP, can you please advise? Thanks.

## 2020-11-03 ENCOUNTER — Encounter: Payer: Self-pay | Admitting: Primary Care

## 2020-11-03 ENCOUNTER — Other Ambulatory Visit: Payer: Self-pay

## 2020-11-03 ENCOUNTER — Ambulatory Visit (INDEPENDENT_AMBULATORY_CARE_PROVIDER_SITE_OTHER): Payer: Medicare Other | Admitting: Primary Care

## 2020-11-03 DIAGNOSIS — J449 Chronic obstructive pulmonary disease, unspecified: Secondary | ICD-10-CM

## 2020-11-03 DIAGNOSIS — S6421XD Injury of radial nerve at wrist and hand level of right arm, subsequent encounter: Secondary | ICD-10-CM | POA: Diagnosis not present

## 2020-11-03 MED ORDER — GUAIFENESIN ER 600 MG PO TB12: 600.0000 mg | ORAL_TABLET | Freq: Two times a day (BID) | ORAL | 1 refills | Status: AC | PRN

## 2020-11-03 MED ORDER — ALBUTEROL SULFATE (2.5 MG/3ML) 0.083% IN NEBU: 2.5000 mg | INHALATION_SOLUTION | Freq: Four times a day (QID) | RESPIRATORY_TRACT | 11 refills | Status: DC | PRN

## 2020-11-03 MED ORDER — DOXYCYCLINE HYCLATE 100 MG PO TABS: 100.0000 mg | ORAL_TABLET | Freq: Two times a day (BID) | ORAL | 0 refills | Status: DC

## 2020-11-03 MED ORDER — PREDNISONE 10 MG PO TABS: ORAL_TABLET | ORAL | 0 refills | Status: DC

## 2020-11-03 NOTE — Progress Notes (Signed)
Virtual Visit via Telephone Note  I connected with Tonya Harper on 11/03/20 at 10:30 AM EDT by telephone and verified that I am speaking with the correct person using two identifiers.  Location: Patient: Home  Provider: Office    I discussed the limitations, risks, security and privacy concerns of performing an evaluation and management service by telephone and the availability of in person appointments. I also discussed with the patient that there may be a patient responsible charge related to this service. The patient expressed understanding and agreed to proceed.   History of Present Illness: 75 year old female, former smoker quit in 2013 (20-pack-year history).  Past medical history significant for COPD GOLD C, chronic respiratory failure with hypoxia O2 dependent, hypertension, coronary artery disease, chronic diastolic heart failure, AAA, NSTEMI, esophageal candidiasis, esophageal stricture, hypothyroidism, fibroid nodule, renal failure.    Former patient of Dr. Carlis Abbott, last seen in office by pulmonary nurse practitioner on 04/25/2020. Maintained on Trelegy 100.    Previous [facility] pulmonary encounter: 04/25/2020 Patient presents today for a 6-week follow-up with chest x-ray. Her breathing appears to be at her baseline. No acute symptoms today. She is more fatigued recently. She has hx iron deficiency anemia and her most recent Hgb 10.1.  She is compliant with Trelegy 100, no issues with ability to use Ellipta inhaler. Medication costs her around 25 dollars a month. CXR today showed hyperinflation with emphysematous changes. No acute findings. She had a skin eruption to her neck/scalp after last round of abx, dermatology did a bx which showed pustular dermatitis. Denies f/c/s, cough, mucus production or wheezing.   11/03/2020- Interim hx  Patient contacted today for acute televisit. She called our office yesterday with reports of congested NP cough x 3-4 days. Her cough is worse when laying down  at night. Mucus is think and she has a difficult time expectorating. She was taking mucinex but ran out. Associated wheezing, chest tightness and shortness of breath with coughing spells only. She is complaint with Trelegy 152mcg daily, using albuterol inhaler 2-3 times a day. Reports temporary improvement with oral steroids which she were prescribed for a right hand injury. She is on 2L chronic oxygen, no change. Denies fever, chills, sweats, chest pain, weight gain or leg swelling.    Observations/Objective:  - Able to speak in full sentences; moderate congested cough   Assessment and Plan:  COPD exacerbation: - Patient developed np congested cough 4 days ago with associated sob, chest tightness and wheezing. She is compliant with Trelegy 12mcg daily. Recommend she resume mucinex 600mg  BID prn to loosen congestion. Sending in RX doxycycline 100mg  BID x 7 days and prednisone taper (40mg  x 2 days; 30mg  x 2 days; 20mg  x 2 days; 10mg  x 2 days). Refilling albuterol nebulizer q 6 hours prn sob/wheezing.    Follow Up Instructions:  - 4-6 weeks with new [facility] pulmonary MD (30 min visit- former [facility] patient)   I discussed the assessment and treatment plan with the patient. The patient was provided an opportunity to ask questions and all were answered. The patient agreed with the plan and demonstrated an understanding of the instructions.   The patient was advised to call back or seek an in-person evaluation if the symptoms worsen or if the condition fails to improve as anticipated.  I provided 22 minutes of non-face-to-face time during this encounter.   Martyn Ehrich, NP

## 2020-11-03 NOTE — Patient Instructions (Signed)
Resume mucinex 600mg  BID prn to loosen congestion Sending in RX doxycycline 100mg  BID x 7 days and prednisone taper (40mg  x 2 days; 30mg  x 2 days; 20mg  x 2 days; 10mg  x 2 days) Refilling albuterol nebulizer q 6 hours prn sob/wheezing Continue Trelegy 1 puff daily Follow-up in 4-6 weeks with new MD

## 2020-11-03 NOTE — Progress Notes (Signed)
Reviewed and agree with assessment/plan.   Chesley Mires, MD [facility] Pulmonary/Critical Care 11/03/2020, 11:07 AM Pager:  [phone]

## 2020-11-06 DIAGNOSIS — S6421XD Injury of radial nerve at wrist and hand level of right arm, subsequent encounter: Secondary | ICD-10-CM | POA: Diagnosis not present

## 2020-11-08 DIAGNOSIS — S6421XD Injury of radial nerve at wrist and hand level of right arm, subsequent encounter: Secondary | ICD-10-CM | POA: Diagnosis not present

## 2020-11-13 DIAGNOSIS — S6421XD Injury of radial nerve at wrist and hand level of right arm, subsequent encounter: Secondary | ICD-10-CM | POA: Diagnosis not present

## 2020-11-15 DIAGNOSIS — M810 Age-related osteoporosis without current pathological fracture: Secondary | ICD-10-CM | POA: Diagnosis not present

## 2020-11-15 DIAGNOSIS — G5630 Lesion of radial nerve, unspecified upper limb: Secondary | ICD-10-CM | POA: Diagnosis not present

## 2020-11-16 ENCOUNTER — Encounter: Payer: Self-pay | Admitting: Internal Medicine

## 2020-11-16 ENCOUNTER — Telehealth: Payer: Self-pay

## 2020-11-16 ENCOUNTER — Ambulatory Visit (INDEPENDENT_AMBULATORY_CARE_PROVIDER_SITE_OTHER): Payer: Medicare Other | Admitting: Internal Medicine

## 2020-11-16 VITALS — BP 120/60 | HR 76 | Ht 64.0 in | Wt 85.0 lb

## 2020-11-16 DIAGNOSIS — K222 Esophageal obstruction: Secondary | ICD-10-CM | POA: Diagnosis not present

## 2020-11-16 DIAGNOSIS — R1310 Dysphagia, unspecified: Secondary | ICD-10-CM

## 2020-11-16 DIAGNOSIS — I255 Ischemic cardiomyopathy: Secondary | ICD-10-CM

## 2020-11-16 DIAGNOSIS — K219 Gastro-esophageal reflux disease without esophagitis: Secondary | ICD-10-CM | POA: Diagnosis not present

## 2020-11-16 DIAGNOSIS — Z86010 Personal history of colonic polyps: Secondary | ICD-10-CM | POA: Diagnosis not present

## 2020-11-16 DIAGNOSIS — Z7901 Long term (current) use of anticoagulants: Secondary | ICD-10-CM | POA: Diagnosis not present

## 2020-11-16 MED ORDER — NA SULFATE-K SULFATE-MG SULF 17.5-3.13-1.6 GM/177ML PO SOLN: 1.0000 | Freq: Once | ORAL | 0 refills | Status: AC

## 2020-11-16 NOTE — Telephone Encounter (Signed)
[facility] Pre-operative Risk Assessment     Request for surgical clearance:     Endoscopy Procedure  What type of surgery is being performed?     Endoscopy/Colonoscopy  When is this surgery scheduled?     01/11/2021  What type of clearance is required ?   Pharmacy  Are there any medications that need to be held prior to surgery and how long? Eliquis starting 2 days prior  Practice name and name of physician performing surgery?      [facility]  What is your office phone and fax number?      Phone- [phone]  [phone]  Anesthesia type (None, local, MAC, general) ?       MAC

## 2020-11-16 NOTE — Patient Instructions (Addendum)
If you are age 73 or older, your body mass index should be between 23-30. Your Body mass index is 14.59 kg/m. If this is out of the aforementioned range listed, please consider follow up with your Primary Care Provider. ________________________________________________________  The [facility] providers would like to encourage you to use [facility] to communicate with providers for non-urgent requests or questions.  Due to long hold times on the telephone, sending your provider a message by [facility] may be a faster and more efficient way to get a response.  Please allow 48 business hours for a response.  Please remember that this is for non-urgent requests.   You have been scheduled for an endoscopy and colonoscopy. Please follow the written instructions given to you at your visit today. Please pick up your prep supplies at the pharmacy within the next 1-3 days. If you use inhalers (even only as needed), please bring them with you on the day of your procedure.  You will be contacted by our office prior to your procedure for directions on holding your Eliquis.  If you do not hear from our office 1 week prior to your scheduled procedure, please call [phone] to discuss.   Continue Pantoprazole 40 mg 1 tablet twice daily.  Follow up pending the results of your Colonoscopy and Endoscopy or as needed.  We are doing the Endoscopy because of your Esophageal Stricture and Dysphagia.   Esophageal Stricture Esophageal stricture is a narrowing of the esophagus. The esophagus is the part of the body that moves food and liquid from your mouth to your stomach. The esophagus can become narrow because of disease or damage to the area. This condition can make swallowing difficult, painful, or even impossible. It also makes choking more likely. What are the causes? The most common cause of this condition is gastroesophageal reflux disease (GERD). Normally, food travels down the esophagus and stays in the stomach  to be digested. In GERD, food and stomach acid move back up into the esophagus. Over time, this causes scar tissue and leads to narrowing. Other causes of esophageal stricture include: Scarring from swallowing a harmful substance. Damage from medical instruments used in the esophagus. Radiation therapy. Cancer. Inflammation of the esophagus. What increases the risk? You are more likely to develop an esophageal stricture if you have GERD or esophageal cancer. What are the signs or symptoms? Symptoms of this condition include: Difficulty swallowing. Pain when swallowing. Burning pain or discomfort in the throat or chest (heartburn). Vomiting or spitting up food or liquids. Unexplained weight loss. How is this diagnosed? This condition may be diagnosed based on: Your symptoms and a physical exam. Tests, such as: Upper endoscopy. Your health care provider will insert a flexible tube with a tiny camera on it (endoscope) into your esophagus to check for a stricture. A tissue sample may also be taken to be examined under a microscope (biopsy). Esophageal pH monitoring. This test involves using a tube to collect acid in the esophagus to determine how much stomach acid is entering the esophagus. Barium swallow test. For this test, you will drink a chalky liquid (barium solution) that coats the lining of the esophagus. Then you will have an X-ray taken. The barium solution helps to show if there is a stricture. How is this treated? Treatment for esophageal stricture depends on what is causing your condition and how severe your condition is. Treatment options include: Esophageal dilation. In this procedure, a health care provider inserts an endoscope or a tool  called a dilator into the esophagus to gently stretch it and make the opening wider. Stents. In some cases, a health care provider may place a small device (stent) in the esophagus to keep it open. Acid-blocking medicines. Taking these can help  you manage GERD symptoms after an esophageal stricture. Controlling your GERD symptoms or being free of them can prevent the stricture from returning. Follow these instructions at home: Eating and drinking Follow instructions from your health care provider about eating or drinking restrictions. Cut your food into small pieces, chew well, and eat slowly. Try to eat soft food that is easier to swallow. Eat and drink only when you are sitting upright. Do not drink alcohol. If you need help quitting, ask your health care provider. Do not eat during the 3 hours before bedtime. Do not overeat at meals. Do not eat foods that can make reflux worse. These include: Fatty foods, such as red meat and processed foods. Spicy foods. Soda. Tomato products. Chocolate. General instructions Take over-the-counter and prescription medicines only as told by your health care provider. Do not use any products that contain nicotine or tobacco, such as cigarettes, e-cigarettes, and chewing tobacco. If you need help quitting, ask your health care provider. Lose weight if you are overweight. Wear loose, comfortable clothing. When lying in bed, raise your head with pillows. This will help to prevent your stomach contents from backing up into your esophagus while you sleep. Keep all follow-up visits. This is important. Contact a health care provider if: You have problems eating or swallowing. You vomit or spit up food and liquid. Your symptoms do not improve with treatment. Get help right away if: You can no longer keep down any food, drink, or your saliva. Summary Esophageal stricture is a narrowing of the part of the body that moves food and liquid from your mouth to your stomach (esophagus). The esophagus can become narrow because of disease or damage to the area. This can make swallowing difficult, painful, or even impossible. Treatment for esophageal stricture depends on what is causing your condition and how  severe your condition is. In some cases, procedures may be done to make the opening of the esophagus wider or to place a stent in the esophagus to keep it open. Do not drink alcohol, overeat at meals, or eat foods that can make reflux worse. This information is not intended to replace advice given to you by your health care provider. Make sure you discuss any questions you have with your health care provider. Document Revised: 06/02/2019 Document Reviewed: 06/02/2019 Elsevier Patient Education  [employer].

## 2020-11-17 ENCOUNTER — Encounter: Payer: Self-pay | Admitting: Internal Medicine

## 2020-11-17 DIAGNOSIS — S6421XD Injury of radial nerve at wrist and hand level of right arm, subsequent encounter: Secondary | ICD-10-CM | POA: Diagnosis not present

## 2020-11-17 NOTE — Progress Notes (Signed)
Subjective:    Patient ID: Tonya Harper, female    DOB: August 18, 1945, 75 y.o.   MRN: [id number]  HPI [name] is a 75 year old female with a history of recurrent and refractory esophageal strictures, GERD with esophagitis, multiple adenomatous colon polyps, CAD with prior MI, COPD on oxygen, hypertension, hyperlipidemia who is here for follow-up.  She is here today with her son.  She has had 2 upper endoscopies in the last 3 months for recurrent dysphagia.  She has had multiple prior upper endoscopies with dilations both at our practice, at [facility].  Her last endoscopy was on on 622 with Dr. Silverio Decamp.  There was severe stenoses between 35 and 40 cm from the incisors.  Balloon dilation was performed to 15 mm.  There was also grade C esophagitis and a 5 mm nodule in the esophagus 35 cm from the incisors.  The stomach was normal as was the examined duodenum.  Biopsy results showed Candida esophagitis and a benign squamous papilloma.  She reports that she has had significant improvement in her dysphagia since her last esophageal dilation.  Her "choking" with swallowing is much better though it is not yet "perfect".  She has some mild heartburn but denies a dyne aphasia.  She does at times have mucus and congestion and during this.  She has a harder time with her swallowing.  She denies abdominal pain.  Reports bowel movements is regular.  She has taken the pantoprazole 40 mg twice daily.  She was treated with fluconazole after her last endoscopy and completed this therapy.   Review of Systems As per HPI, otherwise negative  Current Medications, Allergies, Past Medical History, Past Surgical History, Family History and Social History were reviewed in [employer] record.     Objective:   Physical Exam BP 120/60   Pulse 76   Ht 5\' 4"  (1.626 m)   Wt 85 lb (38.6 kg)   BMI 14.59 kg/m  Gen: awake, alert, NAD HEENT: anicteric, oxygen via [place] CV: RRR, no mrg Pulm: CTA  b/l Abd: soft, thin, NT/ND, +BS throughout Ext: no c/c/e Neuro: nonfocal     Assessment & Plan:  75 year old female with a history of recurrent and refractory esophageal strictures, GERD with esophagitis, multiple adenomatous colon polyps, CAD with prior MI, COPD on oxygen, hypertension, hyperlipidemia who is here for follow-up.   GERD with esophagitis and esophageal stenoses --multiple esophageal strictures dilated multiple times in the past 9 to 10 years.  Most recently dilation to 15 mm with improvement in dysphagia.  Given her recurrent strictures I recommended we repeat upper endoscopy for additional dilation.  We will need to hold her Eliquis for 2 days and seek consultation from the prescribing provider to do so.  I would recommend high-dose PPI given her recurrent GERD and strictures. -- EGD in the outpatient hospital setting --Continue pantoprazole 40 mg twice daily  2.  History of multiple adenomatous colon polyps --overdue for surveillance colonoscopy with last colonoscopy in 2013.  We discussed her overall elevated risk of sedated procedures given her chronic anticoagulation, history of CAD as well as COPD.  After discussing the risk, benefits and alternatives to surveillance colonoscopy she is agreeable and wishes to proceed --Outpatient colonoscopy in the hospital setting  Will hold Eliquis 2 days prior to endoscopic procedures - will instruct when and how to resume after procedure. Benefits and risks of procedure explained including risks of bleeding, perforation, infection, missed lesions, reactions to medications and possible  need for hospitalization and surgery for complications. Additional rare but real risk of stroke or other vascular clotting events off Eliquis also explained and need to seek urgent help if any signs of these problems occur. Will communicate by phone or EMR with patient's  prescribing provider to confirm that holding Eliquis is reasonable in this case.   HIGHER  THAN BASELINE RISK.The nature of the procedures, as well as the risks, benefits, and alternatives were carefully and thoroughly reviewed with the patient. Ample time for discussion and questions allowed. The patient understood, was satisfied, and agreed to proceed.

## 2020-11-20 DIAGNOSIS — S6421XD Injury of radial nerve at wrist and hand level of right arm, subsequent encounter: Secondary | ICD-10-CM | POA: Diagnosis not present

## 2020-11-20 NOTE — Telephone Encounter (Signed)
Patient with diagnosis of afib on Eliquis for anticoagulation.    Procedure: Endoscopy/Colonoscopy Date of procedure: 01/11/21  CHA2DS2-VASc Score = 5  This indicates a 7.2% annual risk of stroke. The patient's score is based upon: CHF History: 1 HTN History: 1 Diabetes History: 0 Stroke History: 0 Vascular Disease History: 1 Age Score: 1 Gender Score: 1   CrCl 19mL/min Platelet count 189K  Per office protocol, patient can hold Eliquis for 2 days prior to procedure as requested.

## 2020-11-27 NOTE — Telephone Encounter (Signed)
Patient has been advised when to stop her Eliquis and that she will be advised when to start back the day of her procedure.

## 2020-11-28 DIAGNOSIS — S6421XD Injury of radial nerve at wrist and hand level of right arm, subsequent encounter: Secondary | ICD-10-CM | POA: Diagnosis not present

## 2020-12-05 ENCOUNTER — Ambulatory Visit: Payer: Medicare Other | Admitting: Pulmonary Disease

## 2020-12-05 ENCOUNTER — Telehealth: Payer: Self-pay | Admitting: Cardiology

## 2020-12-05 NOTE — Telephone Encounter (Signed)
Pt c/o medication issue:  1. Name of Medication: NEXLETOL 180 MG TABS  2. How are you currently taking this medication (dosage and times per day)? TAKE 1 TABLET BY MOUTH DAILY.  3. Are you having a reaction (difficulty breathing--STAT)? no  4. What is your medication issue? Calling to see if its a coupon our office can give her to get the medication because its too expensive

## 2020-12-05 NOTE — Telephone Encounter (Signed)
Called and spoke w/pt and instructed them that they would have to call the nexletol copay card program directly to re enroll. They voiced understanding. I tried to submit for them but they stated that they needed them to call directly.

## 2020-12-05 NOTE — Telephone Encounter (Signed)
Called pt to let her know on GoodRx the medication is $385. Call transferred to [facility] in pharmacy.
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
## 2020-12-07 ENCOUNTER — Other Ambulatory Visit (HOSPITAL_BASED_OUTPATIENT_CLINIC_OR_DEPARTMENT_OTHER): Payer: Self-pay

## 2020-12-07 ENCOUNTER — Ambulatory Visit: Payer: Medicare Other

## 2020-12-07 ENCOUNTER — Ambulatory Visit: Payer: Medicare Other | Attending: Internal Medicine

## 2020-12-07 DIAGNOSIS — Z23 Encounter for immunization: Secondary | ICD-10-CM

## 2020-12-07 MED ORDER — INFLUENZA VAC A&B SA ADJ QUAD 0.5 ML IM PRSY
PREFILLED_SYRINGE | INTRAMUSCULAR | 0 refills | Status: DC
  Filled 2020-12-07: qty 0.5, 1d supply, fill #0

## 2020-12-07 NOTE — Progress Notes (Signed)
   Covid-19 Vaccination Clinic  Name:  Tonya Harper    MRN: [id number] DOB: 05-12-1945  12/07/2020  [name] was observed post Covid-19 immunization for 15 minutes without incident. She was provided with Vaccine Information Sheet and instruction to access the V-Safe system.   [name] was instructed to call 911 with any severe reactions post vaccine: Difficulty breathing  Swelling of face and throat  A fast heartbeat  A bad rash all over body  Dizziness and weakness   Immunizations Administered     Name Date Dose VIS Date Route   Pfizer Covid-19 Vaccine Bivalent Booster 12/07/2020 10:44 AM 0.3 mL 09/27/2020 Intramuscular   Manufacturer: [place]   Lot: [id number]   [place]: [phone]

## 2020-12-26 ENCOUNTER — Telehealth: Payer: Self-pay | Admitting: Cardiology

## 2020-12-26 ENCOUNTER — Other Ambulatory Visit (HOSPITAL_BASED_OUTPATIENT_CLINIC_OR_DEPARTMENT_OTHER): Payer: Self-pay

## 2020-12-26 ENCOUNTER — Other Ambulatory Visit: Payer: Self-pay | Admitting: Cardiovascular Disease

## 2020-12-26 MED ORDER — PFIZER COVID-19 VAC BIVALENT 30 MCG/0.3ML IM SUSP
INTRAMUSCULAR | 0 refills | Status: DC
  Filled 2020-12-26: qty 0.3, 1d supply, fill #0

## 2020-12-26 NOTE — Telephone Encounter (Signed)
  *  STAT* If patient is at the pharmacy, call can be transferred to refill team.   1. Which medications need to be refilled? (please list name of each medication and dose if known) carvedilol (COREG) 3.125 MG tablet  2. Which pharmacy/location (including street and city if local pharmacy) is medication to be sent to? [id number] - [place], [place] [address] AT [address]  3. Do they need a 30 day or 90 day supply? 90 days

## 2020-12-27 MED ORDER — CARVEDILOL 3.125 MG PO TABS: 3.1250 mg | ORAL_TABLET | Freq: Two times a day (BID) | ORAL | 3 refills | Status: DC

## 2020-12-27 NOTE — Telephone Encounter (Signed)
This is a [facility] pt 

## 2020-12-27 NOTE — Telephone Encounter (Signed)
Refill sent in per request.  

## 2020-12-28 ENCOUNTER — Telehealth: Payer: Self-pay | Admitting: Cardiology

## 2020-12-28 NOTE — Telephone Encounter (Signed)
Spoke to pt. She report she recently received a new refill for carvedilol 3.125 mg BID but she has always took 25 mg BID. Nurse informed pt that per office visit on 11/17, she reported she was only taking 3.125 mg BID. Pt state that is incorrect and inquiring which dose MD would like for her to take.   Will forward for clarification.

## 2020-12-28 NOTE — Telephone Encounter (Signed)
Patient called she stated that her carvedilol prescription was wrong.  She states she normally takes 25mg , and this script was sent for 3.125mg . She wants to know if it was recently changed.

## 2020-12-29 MED ORDER — CARVEDILOL 25 MG PO TABS: 25.0000 mg | ORAL_TABLET | Freq: Two times a day (BID) | ORAL | 3 refills | Status: DC

## 2020-12-29 NOTE — Telephone Encounter (Signed)
Spoke with pt, she has already picked up the 3.125 mg tablets. She is aware to take 8 of those in the morning and 8 in the evening until she runs out. New script sent to the pharmacy for the 25 mg tablets.

## 2021-01-01 ENCOUNTER — Encounter (HOSPITAL_COMMUNITY): Payer: Self-pay | Admitting: Internal Medicine

## 2021-01-11 ENCOUNTER — Ambulatory Visit (HOSPITAL_COMMUNITY): Payer: Medicare Other | Admitting: Anesthesiology

## 2021-01-11 ENCOUNTER — Ambulatory Visit (HOSPITAL_COMMUNITY)
Admission: RE | Admit: 2021-01-11 | Discharge: 2021-01-11 | Disposition: A | Discharge status: Home / Self Care | Payer: Medicare Other | Attending: Internal Medicine | Admitting: Internal Medicine

## 2021-01-11 ENCOUNTER — Encounter (HOSPITAL_COMMUNITY)
Admission: RE | Disposition: A | Discharge status: Home / Self Care | Payer: Self-pay | Source: Home / Self Care | Attending: Internal Medicine

## 2021-01-11 ENCOUNTER — Encounter (HOSPITAL_COMMUNITY): Payer: Self-pay | Admitting: Internal Medicine

## 2021-01-11 ENCOUNTER — Other Ambulatory Visit: Payer: Self-pay

## 2021-01-11 DIAGNOSIS — B379 Candidiasis, unspecified: Secondary | ICD-10-CM | POA: Diagnosis not present

## 2021-01-11 DIAGNOSIS — I252 Old myocardial infarction: Secondary | ICD-10-CM | POA: Diagnosis not present

## 2021-01-11 DIAGNOSIS — Z86010 Personal history of colonic polyps: Secondary | ICD-10-CM | POA: Insufficient documentation

## 2021-01-11 DIAGNOSIS — Z1211 Encounter for screening for malignant neoplasm of colon: Secondary | ICD-10-CM | POA: Insufficient documentation

## 2021-01-11 DIAGNOSIS — Z7901 Long term (current) use of anticoagulants: Secondary | ICD-10-CM | POA: Diagnosis not present

## 2021-01-11 DIAGNOSIS — D122 Benign neoplasm of ascending colon: Secondary | ICD-10-CM | POA: Diagnosis not present

## 2021-01-11 DIAGNOSIS — K2100 Gastro-esophageal reflux disease with esophagitis, without bleeding: Secondary | ICD-10-CM | POA: Insufficient documentation

## 2021-01-11 DIAGNOSIS — D120 Benign neoplasm of cecum: Secondary | ICD-10-CM

## 2021-01-11 DIAGNOSIS — D123 Benign neoplasm of transverse colon: Secondary | ICD-10-CM | POA: Diagnosis not present

## 2021-01-11 DIAGNOSIS — I2510 Atherosclerotic heart disease of native coronary artery without angina pectoris: Secondary | ICD-10-CM | POA: Diagnosis not present

## 2021-01-11 DIAGNOSIS — J449 Chronic obstructive pulmonary disease, unspecified: Secondary | ICD-10-CM | POA: Insufficient documentation

## 2021-01-11 DIAGNOSIS — R1310 Dysphagia, unspecified: Secondary | ICD-10-CM | POA: Diagnosis not present

## 2021-01-11 DIAGNOSIS — D125 Benign neoplasm of sigmoid colon: Secondary | ICD-10-CM

## 2021-01-11 DIAGNOSIS — I5022 Chronic systolic (congestive) heart failure: Secondary | ICD-10-CM | POA: Insufficient documentation

## 2021-01-11 DIAGNOSIS — K648 Other hemorrhoids: Secondary | ICD-10-CM | POA: Diagnosis not present

## 2021-01-11 DIAGNOSIS — K222 Esophageal obstruction: Secondary | ICD-10-CM | POA: Insufficient documentation

## 2021-01-11 DIAGNOSIS — K219 Gastro-esophageal reflux disease without esophagitis: Secondary | ICD-10-CM

## 2021-01-11 DIAGNOSIS — I110 Hypertensive heart disease with heart failure: Secondary | ICD-10-CM | POA: Insufficient documentation

## 2021-01-11 DIAGNOSIS — I255 Ischemic cardiomyopathy: Secondary | ICD-10-CM | POA: Diagnosis not present

## 2021-01-11 DIAGNOSIS — D124 Benign neoplasm of descending colon: Secondary | ICD-10-CM

## 2021-01-11 DIAGNOSIS — K635 Polyp of colon: Secondary | ICD-10-CM | POA: Diagnosis not present

## 2021-01-11 HISTORY — PX: BALLOON DILATION: SHX5330

## 2021-01-11 HISTORY — PX: ESOPHAGOGASTRODUODENOSCOPY (EGD) WITH PROPOFOL: SHX5813

## 2021-01-11 HISTORY — PX: POLYPECTOMY: SHX5525

## 2021-01-11 HISTORY — PX: COLONOSCOPY WITH PROPOFOL: SHX5780

## 2021-01-11 SURGERY — ESOPHAGOGASTRODUODENOSCOPY (EGD) WITH PROPOFOL
Anesthesia: Monitor Anesthesia Care

## 2021-01-11 MED ORDER — LIDOCAINE HCL (CARDIAC) PF 100 MG/5ML IV SOSY
PREFILLED_SYRINGE | INTRAVENOUS | Status: DC | PRN
  Administered 2021-01-11: 50 mg via INTRAVENOUS

## 2021-01-11 MED ORDER — PROPOFOL 500 MG/50ML IV EMUL
INTRAVENOUS | Status: DC | PRN
  Administered 2021-01-11: 120 ug/kg/min via INTRAVENOUS

## 2021-01-11 MED ORDER — PHENYLEPHRINE 40 MCG/ML (10ML) SYRINGE FOR IV PUSH (FOR BLOOD PRESSURE SUPPORT)
PREFILLED_SYRINGE | INTRAVENOUS | Status: DC | PRN
  Administered 2021-01-11 (×3): 80 ug via INTRAVENOUS

## 2021-01-11 MED ORDER — PROPOFOL 10 MG/ML IV BOLUS
INTRAVENOUS | Status: DC | PRN
  Administered 2021-01-11 (×7): 10 mg via INTRAVENOUS

## 2021-01-11 MED ORDER — SODIUM CHLORIDE 0.9 % IV SOLN: INTRAVENOUS | Status: DC

## 2021-01-11 SURGICAL SUPPLY — 25 items

## 2021-01-11 NOTE — Op Note (Signed)
[facility] Patient Name: Tonya Harper Procedure Date: 01/11/2021 MRN: [id number] Attending MD: Jerene Bears , MD Date of Birth: 1945-05-17 CSN: [id number] Age: 75 Admit Type: Outpatient Procedure:                Colonoscopy Indications:              High risk colon cancer surveillance: Personal                            history of multiple adenomas, Last colonoscopy:                            April 2013 (10 adenomatous polyps removed at that                            exam) Providers:                Lajuan Lines. Hilarie Fredrickson, MD, Ervin Knack, RN, Tyna Jaksch                            Technician, Lodema Hong Technician, Technician Referring MD:             Ranell Patrick. Carlota Raspberry, MD Medicines:                Monitored Anesthesia Care Complications:            No immediate complications. Estimated Blood Loss:     Estimated blood loss was minimal. Procedure:                Pre-Anesthesia Assessment:                           - Prior to the procedure, a History and Physical                            was performed, and patient medications and                            allergies were reviewed. The patient's tolerance of                            previous anesthesia was also reviewed. The risks                            and benefits of the procedure and the sedation                            options and risks were discussed with the patient.                            All questions were answered, and informed consent                            was obtained. Prior Anticoagulants: The patient has  taken Eliquis (apixaban), last dose was 3 days                            prior to procedure. ASA Grade Assessment: III - A                            patient with severe systemic disease. After                            reviewing the risks and benefits, the patient was                            deemed in satisfactory condition to undergo the                             procedure.                           After obtaining informed consent, the colonoscope                            was passed under direct vision. Throughout the                            procedure, the patient's blood pressure, pulse, and                            oxygen saturations were monitored continuously. The                            PCF-H190TL ([id number]) Olympus slim colonoscope was                            introduced through the anus and advanced to the                            cecum, identified by appendiceal orifice and                            ileocecal valve. The colonoscopy was technically                            difficult and complex due to significant looping.                            Successful completion of the procedure was aided by                            applying abdominal pressure. The patient tolerated                            the procedure well. The quality of the bowel  preparation was excellent. The ileocecal valve,                            appendiceal orifice, and rectum were photographed. Scope In: 11:20:58 AM Scope Out: 11:51:53 AM Scope Withdrawal Time: 0 hours 20 minutes 45 seconds  Total Procedure Duration: 0 hours 30 minutes 55 seconds  Findings:      The digital rectal exam was normal.      A 7 mm polyp was found in the ileocecal valve. The polyp was sessile.       The polyp was removed with a cold snare. Resection and retrieval were       complete.      Two sessile polyps were found in the ascending colon. The polyps were 6       to 7 mm in size. These polyps were removed with a cold snare. Resection       and retrieval were complete.      Four sessile polyps were found in the transverse colon. The polyps were       5 to 8 mm in size. These polyps were removed with a cold snare.       Resection and retrieval were complete.      A 6 mm polyp was found in the descending colon. The polyp was  sessile.       The polyp was removed with a cold snare. Resection and retrieval were       complete.      A 8 mm polyp was found in the sigmoid colon. The polyp was sessile. The       polyp was removed with a cold snare. Resection and retrieval were       complete.      Retroflexion in the rectum was not performed due to anatomy.      Internal hemorrhoids were found during endoscopy. The hemorrhoids were       small. Impression:               - One 7 mm polyp at the ileocecal valve, removed                            with a cold snare. Resected and retrieved.                           - Two 6 to 7 mm polyps in the ascending colon,                            removed with a cold snare. Resected and retrieved.                           - Four 5 to 8 mm polyps in the transverse colon,                            removed with a cold snare. Resected and retrieved.                           - One 6 mm polyp in the descending colon, removed  with a cold snare. Resected and retrieved.                           - One 8 mm polyp in the sigmoid colon, removed with                            a cold snare. Resected and retrieved.                           - Small internal hemorrhoids. Moderate Sedation:      N/A Recommendation:           - Patient has a contact number available for                            emergencies. The signs and symptoms of potential                            delayed complications were discussed with the                            patient. Return to normal activities tomorrow.                            Written discharge instructions were provided to the                            patient.                           - Advance diet as tolerated.                           - Continue present medications.                           - Resume Eliquis (apixaban) at prior dose in 2                            days. Refer to managing physician for further                             adjustment of therapy.                           - Await pathology results.                           - No recommendation at this time regarding repeat                            colonoscopy. Procedure Code(s):        --- Professional ---                           [phone], Colonoscopy, flexible; with removal of  tumor(s), polyp(s), or other lesion(s) by snare                            technique Diagnosis Code(s):        --- Professional ---                           K63.5, Polyp of colon                           Z86.010, Personal history of colonic polyps                           K64.8, Other hemorrhoids CPT copyright 2019 [employer]. All rights reserved. The codes documented in this report are preliminary and upon coder review may  be revised to meet current compliance requirements. Jerene Bears, MD 01/11/2021 12:14:44 PM This report has been signed electronically. Number of Addenda: 0

## 2021-01-11 NOTE — Transfer of Care (Signed)
Immediate Anesthesia Transfer of Care Note  Patient: Tonya Harper  Procedure(s) Performed: ESOPHAGOGASTRODUODENOSCOPY (EGD) WITH PROPOFOL COLONOSCOPY WITH PROPOFOL BALLOON DILATION POLYPECTOMY  Patient Location: PACU  Anesthesia Type:MAC  Level of Consciousness: sedated  Airway & Oxygen Therapy: Patient Spontanous Breathing and Patient connected to face mask oxygen  Post-op Assessment: Report given to RN and Post -op Vital signs reviewed and stable  Post vital signs: Reviewed and stable  Last Vitals:  Vitals Value Taken Time  BP    Temp    Pulse    Resp    SpO2      Last Pain:  Vitals:   01/11/21 0956  TempSrc: Oral         Complications: No notable events documented.
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
## 2021-01-11 NOTE — Op Note (Signed)
[facility] Patient Name: Tonya Harper Procedure Date: 01/11/2021 MRN: [id number] Attending MD: Jerene Bears , MD Date of Birth: Jun 26, 1945 CSN: [id number] Age: 75 Admit Type: Outpatient Procedure:                Upper GI endoscopy Indications:              Dysphagia, For therapy of esophageal stenoses with                            multiple prior dilations, last EGD 10/03/20 --                            balloon dilation in 15 mm; EGD 08/31/20 -- Savary                            dilation to 12.8 mm; dysphagia symptoms improved                            compared to July 2022 Providers:                Lajuan Lines. Hilarie Fredrickson, MD, Ervin Knack, Tyna Jaksch                            Technician, Lodema Hong Technician, Technician Referring MD:             Ranell Patrick. Carlota Raspberry, MD Medicines:                Monitored Anesthesia Care Complications:            No immediate complications. Estimated Blood Loss:     Estimated blood loss was minimal. Procedure:                Pre-Anesthesia Assessment:                           - Prior to the procedure, a History and Physical                            was performed, and patient medications and                            allergies were reviewed. The patient's tolerance of                            previous anesthesia was also reviewed. The risks                            and benefits of the procedure and the sedation                            options and risks were discussed with the patient.                            All questions were answered, and informed consent  was obtained. Prior Anticoagulants: The patient has                            taken Eliquis (apixaban), last dose was 3 days                            prior to procedure. ASA Grade Assessment: III - A                            patient with severe systemic disease. After                            reviewing the risks and benefits, the patient  was                            deemed in satisfactory condition to undergo the                            procedure.                           After obtaining informed consent, the endoscope was                            passed under direct vision. Throughout the                            procedure, the patient's blood pressure, pulse, and                            oxygen saturations were monitored continuously. The                            GIF-H190 ([id number]) Olympus endoscope was introduced                            through the mouth, and advanced to the second part                            of duodenum. The upper GI endoscopy was                            accomplished without difficulty. The patient                            tolerated the procedure well. Scope In: Scope Out: Findings:      Multiple benign-appearing, intrinsic moderate (circumferential scarring       or stenosis; an endoscope may pass) stenoses were found 30 to 40 cm from       the incisors. The narrowest stenosis measured 1 cm (inner diameter). The       stenoses were traversed. There was a moderate rent at GE junction with       simple passage of the upper endoscope. A TTS dilator was passed through  the scope. Dilation with a 12-13.5-15 mm balloon dilator was performed       to 15 mm. This was done at multiple levels in the lower and middle       esophagus. The dilation site was examined and showed moderate mucosal       disruption and moderate improvement in luminal narrowing.      The entire examined stomach was normal.      The examined duodenum was normal. Impression:               - Benign-appearing esophageal stenoses. Dilated to                            15 mm with balloon (as above). Mild esophagitis.                           - Normal stomach.                           - Normal examined duodenum.                           - No specimens collected. Moderate Sedation:       N/A Recommendation:           - Patient has a contact number available for                            emergencies. The signs and symptoms of potential                            delayed complications were discussed with the                            patient. Return to normal activities tomorrow.                            Written discharge instructions were provided to the                            patient.                           - Post-dilation diet. Advance diet as tolerated                            thereafter.                           - Continue present medications.                           - Resume Eliquis (apixaban) at prior dose in 2                            days. Refer to managing physician for further  adjustment of therapy.                           - Repeat upper endoscopy in 3-6 months for                            retreatment. Procedure Code(s):        --- Professional ---                           [phone], Esophagogastroduodenoscopy, flexible,                            transoral; with transendoscopic balloon dilation of                            esophagus (less than 30 mm diameter) Diagnosis Code(s):        --- Professional ---                           K22.2, Esophageal obstruction                           R13.10, Dysphagia, unspecified CPT copyright 2019 American Medical Association. All rights reserved. The codes documented in this report are preliminary and upon coder review may  be revised to meet current compliance requirements. Jerene Bears, MD 01/11/2021 12:10:10 PM This report has been signed electronically. Number of Addenda: 0

## 2021-01-11 NOTE — Anesthesia Preprocedure Evaluation (Signed)
Anesthesia Evaluation  Patient identified by MRN, date of birth, ID band Patient awake    Reviewed: Allergy & Precautions, NPO status , Patient's Chart, lab work & pertinent test results, reviewed documented beta blocker date and time   History of Anesthesia Complications Negative for: history of anesthetic complications  Airway Mallampati: I  TM Distance: >3 FB Neck ROM: Full    Dental  (+) Edentulous Upper, Edentulous Lower   Pulmonary COPD (intermittent supplemental O2),  COPD inhaler and oxygen dependent, former smoker,    Pulmonary exam normal        Cardiovascular hypertension, Pt. on medications and Pt. on home beta blockers (-) angina+ CAD, + Past MI, + Peripheral Vascular Disease and +CHF  Normal cardiovascular exam Rhythm:Regular Rate:Normal  '20 cath Mid LAD lesion is 25% stenosed. Mid Cx lesion is 100% stenosed. Left to left collaterals. Mid RCA lesion is 100% stenosed. Left to right collaterals. Dist LM lesion is 25% stenosed. There is mild left ventricular systolic dysfunction. The left ventricular ejection fraction is 45-50%  '20 ECHO: EF 45-50%. LV has mildly decreased function,  no LVH.  Grade I DD, mild-mod MR, mild AI, mild TR   Neuro/Psych negative neurological ROS     GI/Hepatic Neg liver ROS, hiatal hernia, GERD  Controlled,Esophageal stricture   Endo/Other  Hypothyroidism   Renal/GU Renal InsufficiencyRenal disease     Musculoskeletal   Abdominal   Peds  Hematology eliquis    Anesthesia Other Findings   Reproductive/Obstetrics                             Anesthesia Physical  Anesthesia Plan  ASA: 4  Anesthesia Plan: MAC   Post-op Pain Management: Minimal or no pain anticipated   Induction:   PONV Risk Score and Plan: 2 and Treatment may vary due to age or medical condition and Propofol infusion  Airway Management Planned: Natural Airway and Nasal  Cannula  Additional Equipment: None  Intra-op Plan:   Post-operative Plan:   Informed Consent: I have reviewed the patients History and Physical, chart, labs and discussed the procedure including the risks, benefits and alternatives for the proposed anesthesia with the patient or authorized representative who has indicated his/her understanding and acceptance.       Plan Discussed with: CRNA and Anesthesiologist  Anesthesia Plan Comments:         Anesthesia Quick Evaluation

## 2021-01-11 NOTE — Anesthesia Postprocedure Evaluation (Signed)
Anesthesia Post Note  Patient: Haneen Bernales  Procedure(s) Performed: ESOPHAGOGASTRODUODENOSCOPY (EGD) WITH PROPOFOL COLONOSCOPY WITH PROPOFOL BALLOON DILATION POLYPECTOMY     Patient location during evaluation: PACU Anesthesia Type: MAC Level of consciousness: awake and alert Pain management: pain level controlled Vital Signs Assessment: post-procedure vital signs reviewed and stable Respiratory status: spontaneous breathing, nonlabored ventilation, respiratory function stable and patient connected to nasal cannula oxygen Cardiovascular status: stable and blood pressure returned to baseline Postop Assessment: no apparent nausea or vomiting Anesthetic complications: no   No notable events documented.  Last Vitals:  Vitals:   01/11/21 1215 01/11/21 1220  BP:  (!) 144/70  Pulse: 66 70  Resp: 18 18  Temp:    SpO2: 99% 99%    Last Pain:  Vitals:   01/11/21 1158  TempSrc: Oral  PainSc: 0-No pain                 Dodi Leu

## 2021-01-12 LAB — SURGICAL PATHOLOGY

## 2021-01-15 ENCOUNTER — Encounter: Payer: Self-pay | Admitting: Internal Medicine

## 2021-02-01 ENCOUNTER — Telehealth: Payer: Self-pay | Admitting: Cardiology

## 2021-02-01 MED ORDER — NITROGLYCERIN 0.2 MG/HR TD PT24: 0.2000 mg | MEDICATED_PATCH | Freq: Every day | TRANSDERMAL | 2 refills | Status: DC

## 2021-02-01 MED ORDER — CARVEDILOL 25 MG PO TABS: 25.0000 mg | ORAL_TABLET | Freq: Two times a day (BID) | ORAL | 3 refills | Status: DC

## 2021-02-01 NOTE — Telephone Encounter (Signed)
° ° °*  STAT* If patient is at the pharmacy, call can be transferred to refill team.   1. Which medications need to be refilled? (please list name of each medication and dose if known) carvedilol (COREG) 25 MG tablet nitroGLYCERIN (NITRODUR - DOSED IN MG/24 HR) 0.2 mg/hr patch  2. Which pharmacy/location (including street and city if local pharmacy) is medication to be sent to?  [id number] - [place], [place] [address]  3. Do they need a 30 day or 90 day supply? 90 days

## 2021-02-14 DIAGNOSIS — I10 Essential (primary) hypertension: Secondary | ICD-10-CM | POA: Diagnosis not present

## 2021-02-14 DIAGNOSIS — N183 Chronic kidney disease, stage 3 unspecified: Secondary | ICD-10-CM | POA: Diagnosis not present

## 2021-03-10 ENCOUNTER — Other Ambulatory Visit: Payer: Self-pay | Admitting: Cardiology

## 2021-03-10 DIAGNOSIS — E785 Hyperlipidemia, unspecified: Secondary | ICD-10-CM

## 2021-03-10 DIAGNOSIS — E7800 Pure hypercholesterolemia, unspecified: Secondary | ICD-10-CM

## 2021-03-10 DIAGNOSIS — G720 Drug-induced myopathy: Secondary | ICD-10-CM

## 2021-03-14 DIAGNOSIS — I2510 Atherosclerotic heart disease of native coronary artery without angina pectoris: Secondary | ICD-10-CM | POA: Diagnosis not present

## 2021-03-14 DIAGNOSIS — N390 Urinary tract infection, site not specified: Secondary | ICD-10-CM | POA: Diagnosis not present

## 2021-03-14 DIAGNOSIS — N183 Chronic kidney disease, stage 3 unspecified: Secondary | ICD-10-CM | POA: Diagnosis not present

## 2021-03-14 DIAGNOSIS — J449 Chronic obstructive pulmonary disease, unspecified: Secondary | ICD-10-CM | POA: Diagnosis not present

## 2021-04-05 ENCOUNTER — Other Ambulatory Visit: Payer: Self-pay | Admitting: Critical Care Medicine

## 2021-04-10 ENCOUNTER — Other Ambulatory Visit: Payer: Self-pay | Admitting: Cardiology

## 2021-04-10 DIAGNOSIS — E7800 Pure hypercholesterolemia, unspecified: Secondary | ICD-10-CM

## 2021-04-10 DIAGNOSIS — E785 Hyperlipidemia, unspecified: Secondary | ICD-10-CM

## 2021-04-10 DIAGNOSIS — G720 Drug-induced myopathy: Secondary | ICD-10-CM

## 2021-04-23 DIAGNOSIS — Z1231 Encounter for screening mammogram for malignant neoplasm of breast: Secondary | ICD-10-CM | POA: Diagnosis not present

## 2021-04-26 ENCOUNTER — Other Ambulatory Visit: Payer: Self-pay | Admitting: *Deleted

## 2021-05-01 ENCOUNTER — Other Ambulatory Visit: Payer: Self-pay | Admitting: Cardiology

## 2021-05-02 ENCOUNTER — Other Ambulatory Visit: Payer: Self-pay | Admitting: *Deleted

## 2021-05-02 DIAGNOSIS — R5383 Other fatigue: Secondary | ICD-10-CM | POA: Diagnosis not present

## 2021-05-02 DIAGNOSIS — M810 Age-related osteoporosis without current pathological fracture: Secondary | ICD-10-CM | POA: Diagnosis not present

## 2021-05-02 DIAGNOSIS — E559 Vitamin D deficiency, unspecified: Secondary | ICD-10-CM | POA: Diagnosis not present

## 2021-05-02 NOTE — Progress Notes (Signed)
ct 

## 2021-05-05 ENCOUNTER — Other Ambulatory Visit: Payer: Self-pay | Admitting: Adult Health

## 2021-05-05 DIAGNOSIS — J449 Chronic obstructive pulmonary disease, unspecified: Secondary | ICD-10-CM

## 2021-05-16 ENCOUNTER — Other Ambulatory Visit: Payer: Self-pay | Admitting: *Deleted
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
## 2021-05-29 ENCOUNTER — Encounter: Payer: Self-pay | Admitting: Student

## 2021-05-29 DIAGNOSIS — I255 Ischemic cardiomyopathy: Secondary | ICD-10-CM | POA: Diagnosis not present

## 2021-05-29 DIAGNOSIS — E559 Vitamin D deficiency, unspecified: Secondary | ICD-10-CM | POA: Diagnosis not present

## 2021-05-29 DIAGNOSIS — I10 Essential (primary) hypertension: Secondary | ICD-10-CM | POA: Diagnosis not present

## 2021-05-29 DIAGNOSIS — I5022 Chronic systolic (congestive) heart failure: Secondary | ICD-10-CM | POA: Insufficient documentation

## 2021-05-29 DIAGNOSIS — D6859 Other primary thrombophilia: Secondary | ICD-10-CM | POA: Diagnosis not present

## 2021-05-29 DIAGNOSIS — E042 Nontoxic multinodular goiter: Secondary | ICD-10-CM | POA: Diagnosis not present

## 2021-05-29 DIAGNOSIS — Z87891 Personal history of nicotine dependence: Secondary | ICD-10-CM | POA: Diagnosis not present

## 2021-05-29 DIAGNOSIS — Z1331 Encounter for screening for depression: Secondary | ICD-10-CM | POA: Diagnosis not present

## 2021-05-29 DIAGNOSIS — J441 Chronic obstructive pulmonary disease with (acute) exacerbation: Secondary | ICD-10-CM | POA: Diagnosis not present

## 2021-05-29 DIAGNOSIS — I739 Peripheral vascular disease, unspecified: Secondary | ICD-10-CM | POA: Diagnosis not present

## 2021-05-29 DIAGNOSIS — Z7189 Other specified counseling: Secondary | ICD-10-CM | POA: Diagnosis not present

## 2021-05-29 DIAGNOSIS — Z0000 Encounter for general adult medical examination without abnormal findings: Secondary | ICD-10-CM | POA: Diagnosis not present

## 2021-05-30 ENCOUNTER — Encounter: Payer: Self-pay | Admitting: Student

## 2021-05-30 ENCOUNTER — Ambulatory Visit (INDEPENDENT_AMBULATORY_CARE_PROVIDER_SITE_OTHER): Payer: Medicare Other | Admitting: Student

## 2021-05-30 VITALS — BP 142/64 | HR 71 | Ht 64.0 in | Wt 86.2 lb

## 2021-05-30 DIAGNOSIS — J449 Chronic obstructive pulmonary disease, unspecified: Secondary | ICD-10-CM | POA: Diagnosis not present

## 2021-05-30 DIAGNOSIS — I480 Paroxysmal atrial fibrillation: Secondary | ICD-10-CM

## 2021-05-30 DIAGNOSIS — I2510 Atherosclerotic heart disease of native coronary artery without angina pectoris: Secondary | ICD-10-CM

## 2021-05-30 DIAGNOSIS — I351 Nonrheumatic aortic (valve) insufficiency: Secondary | ICD-10-CM | POA: Diagnosis not present

## 2021-05-30 DIAGNOSIS — I739 Peripheral vascular disease, unspecified: Secondary | ICD-10-CM | POA: Diagnosis not present

## 2021-05-30 DIAGNOSIS — I10 Essential (primary) hypertension: Secondary | ICD-10-CM

## 2021-05-30 DIAGNOSIS — I071 Rheumatic tricuspid insufficiency: Secondary | ICD-10-CM

## 2021-05-30 DIAGNOSIS — E785 Hyperlipidemia, unspecified: Secondary | ICD-10-CM

## 2021-05-30 DIAGNOSIS — I340 Nonrheumatic mitral (valve) insufficiency: Secondary | ICD-10-CM | POA: Diagnosis not present

## 2021-05-30 DIAGNOSIS — I6523 Occlusion and stenosis of bilateral carotid arteries: Secondary | ICD-10-CM | POA: Diagnosis not present

## 2021-05-30 DIAGNOSIS — I255 Ischemic cardiomyopathy: Secondary | ICD-10-CM

## 2021-05-30 DIAGNOSIS — J9611 Chronic respiratory failure with hypoxia: Secondary | ICD-10-CM

## 2021-05-30 DIAGNOSIS — I5022 Chronic systolic (congestive) heart failure: Secondary | ICD-10-CM

## 2021-05-30 MED ORDER — NITROGLYCERIN 0.2 MG/HR TD PT24: 0.2000 mg | MEDICATED_PATCH | Freq: Every day | TRANSDERMAL | 1 refills | Status: DC

## 2021-05-30 MED ORDER — NITROGLYCERIN 0.2 MG/HR TD PT24: 0.2000 mg | MEDICATED_PATCH | Freq: Every day | TRANSDERMAL | 3 refills | Status: DC

## 2021-05-30 MED ORDER — EZETIMIBE 10 MG PO TABS: 10.0000 mg | ORAL_TABLET | Freq: Every day | ORAL | 3 refills | Status: DC

## 2021-05-30 MED ORDER — NITROGLYCERIN 0.4 MG SL SUBL: SUBLINGUAL_TABLET | SUBLINGUAL | 3 refills | Status: DC

## 2021-05-30 MED ORDER — CARVEDILOL 25 MG PO TABS: 25.0000 mg | ORAL_TABLET | Freq: Two times a day (BID) | ORAL | 3 refills | Status: DC

## 2021-05-30 MED ORDER — APIXABAN 5 MG PO TABS: 5.0000 mg | ORAL_TABLET | Freq: Two times a day (BID) | ORAL | 1 refills | Status: DC

## 2021-05-30 MED ORDER — NEXLETOL 180 MG PO TABS: 180.0000 mg | ORAL_TABLET | Freq: Every day | ORAL | 3 refills | Status: DC

## 2021-05-30 MED ORDER — NEXLETOL 180 MG PO TABS: 1.0000 | ORAL_TABLET | Freq: Every day | ORAL | 1 refills | Status: DC

## 2021-05-30 NOTE — Patient Instructions (Signed)
Medication Instructions:  ?Your Physician recommend you continue on your current medication as directed.   ? ?*If you need a refill on your cardiac medications before your next appointment, please call your pharmacy* ? ? ?Testing/Procedures: ?Your physician has requested that you have an echocardiogram. Echocardiography is a painless test that uses sound waves to create images of your heart. It provides your doctor with information about the size and shape of your heart and how well your heart's chambers and valves are working. This procedure takes approximately one hour. There are no restrictions for this procedure. ? ?Your physician has requested that you have an ankle brachial index (ABI). During this test an ultrasound and blood pressure cuff are used to evaluate the arteries that supply the arms and legs with blood. Allow thirty minutes for this exam. There are no restrictions or special instructions. ? ?Your physician has requested that you have a lower extremity arterial duplex. This test is an ultrasound of the arteries in the legs or arms. It looks at arterial blood flow in the legs and arms. Allow one hour for Lower and Upper Arterial scans. There are no restrictions or special instructions ? ? ?Follow-Up: ?At [facility], you and your health needs are our priority.  As part of our continuing mission to provide you with exceptional heart care, we have created designated Provider Care Teams.  These Care Teams include your primary Cardiologist (physician) and Advanced Practice Providers (APPs -  Physician Assistants and Nurse Practitioners) who all work together to provide you with the care you need, when you need it. ? ?We recommend signing up for the patient portal called "MyChart".  Sign up information is provided on this After Visit Summary.  MyChart is used to connect with patients for Virtual Visits (Telemedicine).  Patients are able to view lab/test results, encounter notes, upcoming appointments,  etc.  Non-urgent messages can be sent to your provider as well.   ?To learn more about what you can do with MyChart, go to [email or web address].   ? ?Your next appointment:   ?1 year(s) ? ?The format for your next appointment:   ?In Person ? ?Provider:   ?Kirk Ruths, MD  ? ? ?Other Instructions ?Keep a diary of your blood pressure. If your blood pressure is higher than 130/80 consistently, please call our office. ? ? ?

## 2021-05-31 ENCOUNTER — Telehealth: Payer: Self-pay

## 2021-05-31 NOTE — Telephone Encounter (Signed)
May 30, 2021 lab results received from [facility]. Placed in C. Sarajane Jews and Dr. Stanford Breed mailboxes, and sent to be scanned. ?

## 2021-06-12 ENCOUNTER — Other Ambulatory Visit: Payer: Self-pay

## 2021-06-12 DIAGNOSIS — I739 Peripheral vascular disease, unspecified: Secondary | ICD-10-CM

## 2021-06-12 DIAGNOSIS — I2510 Atherosclerotic heart disease of native coronary artery without angina pectoris: Secondary | ICD-10-CM

## 2021-06-12 DIAGNOSIS — E785 Hyperlipidemia, unspecified: Secondary | ICD-10-CM

## 2021-06-15 ENCOUNTER — Other Ambulatory Visit: Payer: Self-pay | Admitting: Student

## 2021-06-15 DIAGNOSIS — R5383 Other fatigue: Secondary | ICD-10-CM | POA: Diagnosis not present

## 2021-06-15 DIAGNOSIS — I739 Peripheral vascular disease, unspecified: Secondary | ICD-10-CM

## 2021-06-15 DIAGNOSIS — M810 Age-related osteoporosis without current pathological fracture: Secondary | ICD-10-CM | POA: Diagnosis not present

## 2021-07-10 ENCOUNTER — Other Ambulatory Visit (HOSPITAL_COMMUNITY): Payer: Self-pay | Admitting: *Deleted

## 2021-07-10 ENCOUNTER — Ambulatory Visit (HOSPITAL_COMMUNITY): Payer: Medicare Other

## 2021-07-11 ENCOUNTER — Ambulatory Visit (HOSPITAL_COMMUNITY)
Admission: RE | Admit: 2021-07-11 | Discharge: 2021-07-11 | Disposition: A | Discharge status: Home / Self Care | Payer: Medicare Other | Source: Ambulatory Visit | Attending: Sports Medicine | Admitting: Sports Medicine

## 2021-07-11 DIAGNOSIS — M810 Age-related osteoporosis without current pathological fracture: Secondary | ICD-10-CM | POA: Diagnosis not present

## 2021-07-11 MED ORDER — ZOLEDRONIC ACID 5 MG/100ML IV SOLN
5.0000 mg | Freq: Once | INTRAVENOUS | Status: AC
  Administered 2021-07-11: 5 mg via INTRAVENOUS

## 2021-07-11 MED ORDER — ZOLEDRONIC ACID 5 MG/100ML IV SOLN
INTRAVENOUS | Status: AC
  Filled 2021-07-11: qty 100

## 2021-07-15 ENCOUNTER — Other Ambulatory Visit: Payer: Self-pay | Admitting: Cardiology

## 2021-07-15 DIAGNOSIS — I2510 Atherosclerotic heart disease of native coronary artery without angina pectoris: Secondary | ICD-10-CM

## 2021-07-15 DIAGNOSIS — I739 Peripheral vascular disease, unspecified: Secondary | ICD-10-CM

## 2021-07-15 DIAGNOSIS — I5022 Chronic systolic (congestive) heart failure: Secondary | ICD-10-CM

## 2021-07-15 DIAGNOSIS — E785 Hyperlipidemia, unspecified: Secondary | ICD-10-CM

## 2021-07-15 DIAGNOSIS — I480 Paroxysmal atrial fibrillation: Secondary | ICD-10-CM

## 2021-07-15 DIAGNOSIS — I351 Nonrheumatic aortic (valve) insufficiency: Secondary | ICD-10-CM

## 2021-07-15 DIAGNOSIS — I6523 Occlusion and stenosis of bilateral carotid arteries: Secondary | ICD-10-CM

## 2021-07-15 DIAGNOSIS — I340 Nonrheumatic mitral (valve) insufficiency: Secondary | ICD-10-CM

## 2021-07-15 DIAGNOSIS — I255 Ischemic cardiomyopathy: Secondary | ICD-10-CM

## 2021-07-15 DIAGNOSIS — I071 Rheumatic tricuspid insufficiency: Secondary | ICD-10-CM

## 2021-07-15 DIAGNOSIS — I10 Essential (primary) hypertension: Secondary | ICD-10-CM

## 2021-07-19 ENCOUNTER — Ambulatory Visit (HOSPITAL_COMMUNITY)
Admission: RE | Admit: 2021-07-19 | Discharge: 2021-07-19 | Disposition: A | Discharge status: Home / Self Care | Payer: Medicare Other | Source: Ambulatory Visit | Attending: Internal Medicine | Admitting: Internal Medicine

## 2021-07-19 DIAGNOSIS — I739 Peripheral vascular disease, unspecified: Secondary | ICD-10-CM | POA: Diagnosis not present

## 2021-07-19 DIAGNOSIS — E785 Hyperlipidemia, unspecified: Secondary | ICD-10-CM

## 2021-07-19 DIAGNOSIS — I10 Essential (primary) hypertension: Secondary | ICD-10-CM

## 2021-07-19 DIAGNOSIS — I255 Ischemic cardiomyopathy: Secondary | ICD-10-CM

## 2021-07-19 DIAGNOSIS — I2510 Atherosclerotic heart disease of native coronary artery without angina pectoris: Secondary | ICD-10-CM

## 2021-07-19 DIAGNOSIS — I071 Rheumatic tricuspid insufficiency: Secondary | ICD-10-CM

## 2021-07-19 DIAGNOSIS — I6523 Occlusion and stenosis of bilateral carotid arteries: Secondary | ICD-10-CM

## 2021-07-19 DIAGNOSIS — I351 Nonrheumatic aortic (valve) insufficiency: Secondary | ICD-10-CM

## 2021-07-19 DIAGNOSIS — I480 Paroxysmal atrial fibrillation: Secondary | ICD-10-CM

## 2021-07-19 DIAGNOSIS — I5022 Chronic systolic (congestive) heart failure: Secondary | ICD-10-CM

## 2021-07-19 DIAGNOSIS — I340 Nonrheumatic mitral (valve) insufficiency: Secondary | ICD-10-CM

## 2021-07-27 ENCOUNTER — Ambulatory Visit (HOSPITAL_BASED_OUTPATIENT_CLINIC_OR_DEPARTMENT_OTHER)
Admission: RE | Admit: 2021-07-27 | Discharge: 2021-07-27 | Disposition: A | Discharge status: Home / Self Care | Payer: Medicare Other | Source: Ambulatory Visit | Attending: Student | Admitting: Student

## 2021-07-27 DIAGNOSIS — I340 Nonrheumatic mitral (valve) insufficiency: Secondary | ICD-10-CM | POA: Insufficient documentation

## 2021-07-27 DIAGNOSIS — I071 Rheumatic tricuspid insufficiency: Secondary | ICD-10-CM | POA: Insufficient documentation

## 2021-07-27 DIAGNOSIS — I739 Peripheral vascular disease, unspecified: Secondary | ICD-10-CM | POA: Insufficient documentation

## 2021-07-27 DIAGNOSIS — E785 Hyperlipidemia, unspecified: Secondary | ICD-10-CM | POA: Diagnosis not present

## 2021-07-27 DIAGNOSIS — I351 Nonrheumatic aortic (valve) insufficiency: Secondary | ICD-10-CM | POA: Insufficient documentation

## 2021-07-27 DIAGNOSIS — I480 Paroxysmal atrial fibrillation: Secondary | ICD-10-CM | POA: Diagnosis not present

## 2021-07-27 DIAGNOSIS — I6523 Occlusion and stenosis of bilateral carotid arteries: Secondary | ICD-10-CM | POA: Diagnosis not present

## 2021-07-27 DIAGNOSIS — I2510 Atherosclerotic heart disease of native coronary artery without angina pectoris: Secondary | ICD-10-CM | POA: Insufficient documentation

## 2021-07-27 DIAGNOSIS — I361 Nonrheumatic tricuspid (valve) insufficiency: Secondary | ICD-10-CM

## 2021-07-27 DIAGNOSIS — I255 Ischemic cardiomyopathy: Secondary | ICD-10-CM | POA: Diagnosis not present

## 2021-07-27 DIAGNOSIS — I5022 Chronic systolic (congestive) heart failure: Secondary | ICD-10-CM | POA: Diagnosis not present

## 2021-07-27 DIAGNOSIS — I10 Essential (primary) hypertension: Secondary | ICD-10-CM | POA: Insufficient documentation

## 2021-07-27 NOTE — Progress Notes (Signed)
  Echocardiogram 2D Echocardiogram has been performed.  Merrie Roof F 07/27/2021, 1:05 PM

## 2021-07-30 LAB — ECHOCARDIOGRAM COMPLETE
AR max vel: 0.97 cm2
AV Area VTI: 0.86 cm2
AV Area mean vel: 0.88 cm2
AV Mean grad: 7 mmHg
AV Peak grad: 13 mmHg
Ao pk vel: 1.8 m/s
Area-P 1/2: 3.68 cm2
Calc EF: 49.8 %
MV M vel: 5.89 m/s
MV Peak grad: 138.8 mmHg
P 1/2 time: 552 msec
Radius: 0.6 cm
S' Lateral: 4.1 cm
Single Plane A2C EF: 52.6 %
Single Plane A4C EF: 43.5 %
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
## 2021-08-30 ENCOUNTER — Encounter: Payer: Self-pay | Admitting: Cardiology

## 2021-08-30 ENCOUNTER — Ambulatory Visit (INDEPENDENT_AMBULATORY_CARE_PROVIDER_SITE_OTHER): Payer: Medicare Other | Admitting: Cardiology

## 2021-08-30 VITALS — BP 120/66 | HR 72 | Ht 64.0 in | Wt 83.4 lb

## 2021-08-30 DIAGNOSIS — I2510 Atherosclerotic heart disease of native coronary artery without angina pectoris: Secondary | ICD-10-CM | POA: Diagnosis not present

## 2021-08-30 DIAGNOSIS — I7143 Infrarenal abdominal aortic aneurysm, without rupture: Secondary | ICD-10-CM | POA: Diagnosis not present

## 2021-08-30 DIAGNOSIS — I480 Paroxysmal atrial fibrillation: Secondary | ICD-10-CM | POA: Diagnosis not present

## 2021-08-30 DIAGNOSIS — I255 Ischemic cardiomyopathy: Secondary | ICD-10-CM | POA: Diagnosis not present

## 2021-08-30 DIAGNOSIS — I739 Peripheral vascular disease, unspecified: Secondary | ICD-10-CM

## 2021-08-30 NOTE — Patient Instructions (Signed)
    Testing/Procedures:  Your physician has requested that you have an abdominal aorta duplex. During this test, an ultrasound is used to evaluate the aorta. Allow 30 minutes for this exam. Do not eat after midnight the day before and avoid carbonated beverages [place] OFFICE-1ST FLOOR IMAGING DEPARTMENT   Follow-Up: At [facility], you and your health needs are our priority.  As part of our continuing mission to provide you with exceptional heart care, we have created designated Provider Care Teams.  These Care Teams include your primary Cardiologist (physician) and Advanced Practice Providers (APPs -  Physician Assistants and Nurse Practitioners) who all work together to provide you with the care you need, when you need it.  We recommend signing up for the patient portal called "MyChart".  Sign up information is provided on this After Visit Summary.  MyChart is used to connect with patients for Virtual Visits (Telemedicine).  Patients are able to view lab/test results, encounter notes, upcoming appointments, etc.  Non-urgent messages can be sent to your provider as well.   To learn more about what you can do with MyChart, go to [email or web address].    Your next appointment:   6 month(s)  The format for your next appointment:   In Person  Provider:   Kirk Ruths, MD IN THE [place]

## 2021-09-03 ENCOUNTER — Encounter: Payer: Self-pay | Admitting: *Deleted

## 2021-09-03 ENCOUNTER — Ambulatory Visit (HOSPITAL_BASED_OUTPATIENT_CLINIC_OR_DEPARTMENT_OTHER)
Admission: RE | Admit: 2021-09-03 | Discharge: 2021-09-03 | Disposition: A | Discharge status: Home / Self Care | Payer: Medicare Other | Source: Ambulatory Visit | Attending: Cardiology | Admitting: Cardiology

## 2021-09-03 DIAGNOSIS — I7143 Infrarenal abdominal aortic aneurysm, without rupture: Secondary | ICD-10-CM | POA: Diagnosis not present

## 2021-09-03 DIAGNOSIS — Z136 Encounter for screening for cardiovascular disorders: Secondary | ICD-10-CM | POA: Diagnosis not present

## 2021-09-10 DIAGNOSIS — N183 Chronic kidney disease, stage 3 unspecified: Secondary | ICD-10-CM | POA: Diagnosis not present

## 2021-09-12 DIAGNOSIS — J449 Chronic obstructive pulmonary disease, unspecified: Secondary | ICD-10-CM | POA: Diagnosis not present

## 2021-09-12 DIAGNOSIS — N183 Chronic kidney disease, stage 3 unspecified: Secondary | ICD-10-CM | POA: Diagnosis not present

## 2021-09-12 DIAGNOSIS — N390 Urinary tract infection, site not specified: Secondary | ICD-10-CM | POA: Diagnosis not present

## 2021-09-12 DIAGNOSIS — I2510 Atherosclerotic heart disease of native coronary artery without angina pectoris: Secondary | ICD-10-CM | POA: Diagnosis not present

## 2021-09-12 DIAGNOSIS — I509 Heart failure, unspecified: Secondary | ICD-10-CM | POA: Diagnosis not present

## 2021-11-26 ENCOUNTER — Other Ambulatory Visit (HOSPITAL_BASED_OUTPATIENT_CLINIC_OR_DEPARTMENT_OTHER): Payer: Self-pay

## 2021-11-26 DIAGNOSIS — Z23 Encounter for immunization: Secondary | ICD-10-CM | POA: Diagnosis not present

## 2021-11-26 MED ORDER — COMIRNATY 30 MCG/0.3ML IM SUSY
PREFILLED_SYRINGE | INTRAMUSCULAR | 0 refills | Status: DC
Start: 2021-11-26 — End: 2022-03-08
  Filled 2021-11-26: qty 0.3, 1d supply, fill #0

## 2021-11-26 MED ORDER — FLUAD QUADRIVALENT 0.5 ML IM PRSY
PREFILLED_SYRINGE | INTRAMUSCULAR | 0 refills | Status: DC
  Filled 2021-11-26: qty 0.5, 1d supply, fill #0

## 2021-11-29 DIAGNOSIS — I5022 Chronic systolic (congestive) heart failure: Secondary | ICD-10-CM | POA: Diagnosis not present

## 2021-11-29 DIAGNOSIS — J438 Other emphysema: Secondary | ICD-10-CM | POA: Diagnosis not present

## 2021-11-29 DIAGNOSIS — D638 Anemia in other chronic diseases classified elsewhere: Secondary | ICD-10-CM | POA: Diagnosis not present

## 2021-11-29 DIAGNOSIS — M810 Age-related osteoporosis without current pathological fracture: Secondary | ICD-10-CM | POA: Diagnosis not present

## 2021-11-29 DIAGNOSIS — J449 Chronic obstructive pulmonary disease, unspecified: Secondary | ICD-10-CM | POA: Diagnosis not present

## 2021-11-29 DIAGNOSIS — I2510 Atherosclerotic heart disease of native coronary artery without angina pectoris: Secondary | ICD-10-CM | POA: Diagnosis not present

## 2021-11-29 DIAGNOSIS — I7141 Pararenal abdominal aortic aneurysm, without rupture: Secondary | ICD-10-CM | POA: Diagnosis not present

## 2021-11-29 DIAGNOSIS — I10 Essential (primary) hypertension: Secondary | ICD-10-CM | POA: Diagnosis not present

## 2021-11-29 DIAGNOSIS — F3341 Major depressive disorder, recurrent, in partial remission: Secondary | ICD-10-CM | POA: Diagnosis not present

## 2021-11-29 DIAGNOSIS — I255 Ischemic cardiomyopathy: Secondary | ICD-10-CM | POA: Diagnosis not present

## 2021-11-29 DIAGNOSIS — E43 Unspecified severe protein-calorie malnutrition: Secondary | ICD-10-CM | POA: Diagnosis not present

## 2021-11-29 DIAGNOSIS — E890 Postprocedural hypothyroidism: Secondary | ICD-10-CM | POA: Diagnosis not present

## 2022-01-14 DIAGNOSIS — R1310 Dysphagia, unspecified: Secondary | ICD-10-CM | POA: Diagnosis not present

## 2022-01-14 DIAGNOSIS — J449 Chronic obstructive pulmonary disease, unspecified: Secondary | ICD-10-CM | POA: Diagnosis not present

## 2022-01-14 DIAGNOSIS — Z7901 Long term (current) use of anticoagulants: Secondary | ICD-10-CM | POA: Diagnosis not present

## 2022-01-14 DIAGNOSIS — K222 Esophageal obstruction: Secondary | ICD-10-CM | POA: Diagnosis not present

## 2022-01-14 DIAGNOSIS — T18128A Food in esophagus causing other injury, initial encounter: Secondary | ICD-10-CM | POA: Diagnosis not present

## 2022-01-14 DIAGNOSIS — I493 Ventricular premature depolarization: Secondary | ICD-10-CM | POA: Diagnosis not present

## 2022-01-14 DIAGNOSIS — Z8719 Personal history of other diseases of the digestive system: Secondary | ICD-10-CM | POA: Diagnosis not present

## 2022-01-14 DIAGNOSIS — I498 Other specified cardiac arrhythmias: Secondary | ICD-10-CM | POA: Diagnosis not present

## 2022-01-14 DIAGNOSIS — T18108A Unspecified foreign body in esophagus causing other injury, initial encounter: Secondary | ICD-10-CM | POA: Diagnosis not present

## 2022-01-14 DIAGNOSIS — R12 Heartburn: Secondary | ICD-10-CM | POA: Diagnosis not present

## 2022-01-14 DIAGNOSIS — K209 Esophagitis, unspecified without bleeding: Secondary | ICD-10-CM | POA: Diagnosis not present

## 2022-01-14 DIAGNOSIS — K200 Eosinophilic esophagitis: Secondary | ICD-10-CM | POA: Diagnosis not present

## 2022-01-14 DIAGNOSIS — D72829 Elevated white blood cell count, unspecified: Secondary | ICD-10-CM | POA: Diagnosis not present

## 2022-01-14 DIAGNOSIS — D130 Benign neoplasm of esophagus: Secondary | ICD-10-CM | POA: Diagnosis not present

## 2022-01-14 DIAGNOSIS — R0989 Other specified symptoms and signs involving the circulatory and respiratory systems: Secondary | ICD-10-CM | POA: Diagnosis not present

## 2022-01-14 DIAGNOSIS — Z9981 Dependence on supplemental oxygen: Secondary | ICD-10-CM | POA: Diagnosis not present

## 2022-01-14 DIAGNOSIS — Z9889 Other specified postprocedural states: Secondary | ICD-10-CM | POA: Diagnosis not present

## 2022-01-14 DIAGNOSIS — I517 Cardiomegaly: Secondary | ICD-10-CM | POA: Diagnosis not present

## 2022-01-14 DIAGNOSIS — Z87891 Personal history of nicotine dependence: Secondary | ICD-10-CM | POA: Diagnosis not present

## 2022-02-11 ENCOUNTER — Other Ambulatory Visit (HOSPITAL_BASED_OUTPATIENT_CLINIC_OR_DEPARTMENT_OTHER): Payer: Self-pay

## 2022-02-11 MED ORDER — AREXVY 120 MCG/0.5ML IM SUSR
INTRAMUSCULAR | 0 refills | Status: DC
Start: 2022-02-11 — End: 2022-03-08
  Filled 2022-02-11: qty 1, 1d supply, fill #0

## 2022-02-12 ENCOUNTER — Other Ambulatory Visit: Payer: Self-pay | Admitting: Student

## 2022-02-14 ENCOUNTER — Telehealth: Payer: Self-pay

## 2022-02-14 ENCOUNTER — Ambulatory Visit (INDEPENDENT_AMBULATORY_CARE_PROVIDER_SITE_OTHER): Payer: Medicare Other | Admitting: Physician Assistant

## 2022-02-14 ENCOUNTER — Encounter: Payer: Self-pay | Admitting: Physician Assistant

## 2022-02-14 VITALS — BP 90/68 | HR 75 | Ht 64.0 in | Wt 82.2 lb

## 2022-02-14 DIAGNOSIS — D369 Benign neoplasm, unspecified site: Secondary | ICD-10-CM | POA: Diagnosis not present

## 2022-02-14 DIAGNOSIS — Z7901 Long term (current) use of anticoagulants: Secondary | ICD-10-CM

## 2022-02-14 DIAGNOSIS — Z9981 Dependence on supplemental oxygen: Secondary | ICD-10-CM | POA: Diagnosis not present

## 2022-02-14 DIAGNOSIS — R1310 Dysphagia, unspecified: Secondary | ICD-10-CM

## 2022-02-14 DIAGNOSIS — K222 Esophageal obstruction: Secondary | ICD-10-CM

## 2022-02-14 MED ORDER — PANTOPRAZOLE SODIUM 40 MG PO TBEC: 40.0000 mg | DELAYED_RELEASE_TABLET | Freq: Two times a day (BID) | ORAL | 5 refills | Status: DC
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
## 2022-02-14 NOTE — Patient Instructions (Signed)
We have sent the following medications to your pharmacy for you to pick up at your convenience: Pantoprazole     You will be contacted regarding scheduling Endoscopy after we have spoke with Dr. Hilarie Fredrickson. If you have not from our office within 1 week, please contact us at [phone] ask for Debralee Braaksma-CMA.   We will also go ahead and requesting clearance for you hold your Eliquis. We will contact you once we have clearance.   _______________________________________________________  If your blood pressure at your visit was 140/90 or greater, please contact your primary care physician to follow up on this.  _______________________________________________________  If you are age 75 or older, your body mass index should be between 23-30. Your Body mass index is 14.12 kg/m. If this is out of the aforementioned range listed, please consider follow up with your Primary Care Provider.  If you are age 35 or younger, your body mass index should be between 19-25. Your Body mass index is 14.12 kg/m. If this is out of the aformentioned range listed, please consider follow up with your Primary Care Provider.   ________________________________________________________  The [facility] providers would like to encourage you to use [facility] to communicate with providers for non-urgent requests or questions.  Due to long hold times on the telephone, sending your provider a message by [facility] may be a faster and more efficient way to get a response.  Please allow 48 business hours for a response.  Please remember that this is for non-urgent requests.  _______________________________________________________  Thank you for choosing me and [facility].  Ellouise Newer  PA-C

## 2022-02-14 NOTE — Telephone Encounter (Signed)
Request for surgical clearance:     Endoscopy Procedure  What type of surgery is being performed?     EGD with Dil   When is this surgery scheduled?     TBD  What type of clearance is required ?   Pharmacy  Are there any medications that need to be held prior to surgery and how long? Eliquis x2 days prior to procedure.   Practice name and name of physician performing surgery?      [facility]  What is your office phone and fax number?      Phone- [phone]  [phone]  Anesthesia type (None, local, MAC, general) ?       MAC

## 2022-02-15 ENCOUNTER — Telehealth: Payer: Self-pay

## 2022-02-15 DIAGNOSIS — Z7901 Long term (current) use of anticoagulants: Secondary | ICD-10-CM

## 2022-02-15 DIAGNOSIS — Z9981 Dependence on supplemental oxygen: Secondary | ICD-10-CM

## 2022-02-15 DIAGNOSIS — D369 Benign neoplasm, unspecified site: Secondary | ICD-10-CM

## 2022-02-15 DIAGNOSIS — K222 Esophageal obstruction: Secondary | ICD-10-CM

## 2022-02-15 DIAGNOSIS — R1310 Dysphagia, unspecified: Secondary | ICD-10-CM

## 2022-02-15 NOTE — Telephone Encounter (Signed)
Dr. Hilarie Fredrickson is booked for several months. Dr. Bryan Lemma does have hospital availability on 1/29/4 at [facility]. OK to schedule with him?

## 2022-02-15 NOTE — Telephone Encounter (Signed)
Spoke with patient who is agreeable to do a tele visit on 2/8 at 10 am.  Consent given and Med rec needs to be completed.

## 2022-02-15 NOTE — Telephone Encounter (Signed)
-----  Message from Levin Erp, [place] sent at 02/15/2022  2:33 PM EST ----- Regarding: EGD Patient needs EGD at the hospital with Dr. Hilarie Fredrickson for esophageal dilation, she needs to hold her Eliquis for 2 days prior to time procedure.  This needs to be ASAP or I fear she will end up back in the hospital.  Thanks, JL L ----- Message ----- From: Jerene Bears, MD Sent: 02/15/2022  12:44 PM EST To: Levin Erp, PA     ----- Message ----- From: Levin Erp, [place] Sent: 02/14/2022  10:55 AM EST To: Jerene Bears, MD

## 2022-02-15 NOTE — Telephone Encounter (Signed)
Patient with diagnosis of PAF on Eliquis for anticoagulation.    Procedure: EGD with Dil   Date of procedure: TBD   CHA2DS2-VASc Score = 6  This indicates a 9.7% annual risk of stroke. The patient's score is based upon: CHF History: 1 HTN History: 1 Diabetes History: 0 Stroke History: 0 Vascular Disease History: 1 Age Score: 2 Gender Score: 1    CrCl 20 mL/min Platelet count 224K  Per office protocol, patient can hold Eliquis for 2 days prior to procedure.    **This guidance is not considered finalized until pre-operative APP has relayed final recommendations.**

## 2022-02-15 NOTE — Progress Notes (Signed)
Addendum: Reviewed and agree with assessment and management plan. Esophageal papillomas are benign and do not need complete resection or specific follow-up. However, given her history of esophageal strictures and food impactions repeat upper endoscopy in the outpatient hospital setting after approved interruption of DOAC remains recommended. Shareena Nusz, Lajuan Lines, MD

## 2022-02-15 NOTE — Telephone Encounter (Signed)
   Name: Tonya Harper  DOB: 1945-05-19  MRN: [id number]  Primary Cardiologist: Kirk Ruths, MD   Preoperative team, please contact this patient and set up a phone call appointment for further preoperative risk assessment. Please obtain consent and complete medication review. Thank you for your help.  I confirm that guidance regarding antiplatelet and oral anticoagulation therapy has been completed and, if necessary, noted below.  Pharmacy has addressed anticoagulation request.   Deberah Pelton, NP 02/15/2022, 11:31 AM [place]

## 2022-02-15 NOTE — Telephone Encounter (Signed)
  Patient Consent for Virtual Visit        Tonya Harper has provided verbal consent on 02/15/2022 for a virtual visit (video or telephone).   CONSENT FOR VIRTUAL VISIT FOR:  Tonya Harper  By participating in this virtual visit I agree to the following:  I hereby voluntarily request, consent and authorize [facility] and its employed or contracted physicians, physician assistants, nurse practitioners or other licensed health care professionals (the Practitioner), to provide me with telemedicine health care services (the "Services") as deemed necessary by the treating Practitioner. I acknowledge and consent to receive the Services by the Practitioner via telemedicine. I understand that the telemedicine visit will involve communicating with the Practitioner through live audiovisual communication technology and the disclosure of certain medical information by electronic transmission. I acknowledge that I have been given the opportunity to request an in-person assessment or other available alternative prior to the telemedicine visit and am voluntarily participating in the telemedicine visit.  I understand that I have the right to withhold or withdraw my consent to the use of telemedicine in the course of my care at any time, without affecting my right to future care or treatment, and that the Practitioner or I may terminate the telemedicine visit at any time. I understand that I have the right to inspect all information obtained and/or recorded in the course of the telemedicine visit and may receive copies of available information for a reasonable fee.  I understand that some of the potential risks of receiving the Services via telemedicine include:  Delay or interruption in medical evaluation due to technological equipment failure or disruption; Information transmitted may not be sufficient (e.g. poor resolution of images) to allow for appropriate medical decision making by the Practitioner;  and/or  In rare instances, security protocols could fail, causing a breach of personal health information.  Furthermore, I acknowledge that it is my responsibility to provide information about my medical history, conditions and care that is complete and accurate to the best of my ability. I acknowledge that Practitioner's advice, recommendations, and/or decision may be based on factors not within their control, such as incomplete or inaccurate data provided by me or distortions of diagnostic images or specimens that may result from electronic transmissions. I understand that the practice of medicine is not an exact science and that Practitioner makes no warranties or guarantees regarding treatment outcomes. I acknowledge that a copy of this consent can be made available to me via my patient portal ([facility]), or I can request a printed copy by calling the office of [place].    I understand that my insurance will be billed for this visit.   I have read or had this consent read to me. I understand the contents of this consent, which adequately explains the benefits and risks of the Services being provided via telemedicine.  I have been provided ample opportunity to ask questions regarding this consent and the Services and have had my questions answered to my satisfaction. I give my informed consent for the services to be provided through the use of telemedicine in my medical care

## 2022-02-15 NOTE — Telephone Encounter (Signed)
[place] with me if okay with VC She will need dilation

## 2022-02-18 ENCOUNTER — Other Ambulatory Visit: Payer: Self-pay

## 2022-02-18 DIAGNOSIS — R1310 Dysphagia, unspecified: Secondary | ICD-10-CM

## 2022-02-18 DIAGNOSIS — Z7901 Long term (current) use of anticoagulants: Secondary | ICD-10-CM

## 2022-02-18 DIAGNOSIS — Z9981 Dependence on supplemental oxygen: Secondary | ICD-10-CM

## 2022-02-18 DIAGNOSIS — K222 Esophageal obstruction: Secondary | ICD-10-CM

## 2022-02-18 NOTE — Telephone Encounter (Signed)
Called and spoke with patient. Patient has a virtual appt with cardiology on 03/07/22 for clearance to hold Eliquis 2 days prior to her procedure. Pt has been scheduled for an EGD with dilation at [facility] on Tuesday, 03/12/22 at 9:45 am with Dr. Bryan Lemma. Pt will need to arrive by 8:15 am with a care partner. I informed patient that I will mail her instructions to her, she confirmed address on file. Pt knows that we will contact her regarding Eliquis hold on 2/8 after her cardiology appt. Pt verbalized understanding and had no concerns at the end of the call.  Ambulatory referral to GI in epic. EGD instructions mailed to patient.

## 2022-03-05 ENCOUNTER — Encounter (HOSPITAL_COMMUNITY): Payer: Self-pay | Admitting: Gastroenterology
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
## 2022-03-07 ENCOUNTER — Ambulatory Visit: Payer: Medicare Other | Attending: Cardiology

## 2022-03-07 ENCOUNTER — Telehealth: Payer: Self-pay

## 2022-03-07 DIAGNOSIS — Z01810 Encounter for preprocedural cardiovascular examination: Secondary | ICD-10-CM | POA: Diagnosis not present

## 2022-03-07 NOTE — Telephone Encounter (Signed)
Reviewed cardiology telephone visit from today. Cardiology has approved 2 day Eliquis hold. I called and spoke with patient regarding Eliquis hold. Pt has been advised that the last day she should take Eliquis is 03/09/22. Pt is aware that Dr. Bryan Lemma will let her know when to resume after the procedure. Pt verbalized understanding and had no concerns at the end of the call.

## 2022-03-07 NOTE — Telephone Encounter (Signed)
-----   Message from Yevette Edwards, RN sent at 02/18/2022  8:44 AM EST ----- Regarding: Eliquis hold Eliquis hold (2 days) EGD on 2/13 Cardiology appt today

## 2022-03-08 NOTE — Telephone Encounter (Addendum)
Per Ambrose Pancoast -NP patient can hold Eliquis x2 days prior to procedure. Please resume post - op when felt to be safe.  Patient has been informed and voiced understanding.

## 2022-03-12 ENCOUNTER — Ambulatory Visit (HOSPITAL_BASED_OUTPATIENT_CLINIC_OR_DEPARTMENT_OTHER): Payer: Medicare Other | Admitting: Anesthesiology

## 2022-03-12 ENCOUNTER — Encounter (HOSPITAL_COMMUNITY): Payer: Self-pay | Admitting: Gastroenterology

## 2022-03-12 ENCOUNTER — Encounter (HOSPITAL_COMMUNITY)
Admission: RE | Disposition: A | Discharge status: Home / Self Care | Payer: Self-pay | Source: Home / Self Care | Attending: Gastroenterology

## 2022-03-12 ENCOUNTER — Ambulatory Visit (HOSPITAL_COMMUNITY): Payer: Medicare Other | Admitting: Anesthesiology

## 2022-03-12 ENCOUNTER — Ambulatory Visit (HOSPITAL_COMMUNITY)
Admission: RE | Admit: 2022-03-12 | Discharge: 2022-03-12 | Disposition: A | Discharge status: Home / Self Care | Payer: Medicare Other | Attending: Gastroenterology | Admitting: Gastroenterology

## 2022-03-12 DIAGNOSIS — I110 Hypertensive heart disease with heart failure: Secondary | ICD-10-CM | POA: Insufficient documentation

## 2022-03-12 DIAGNOSIS — I5022 Chronic systolic (congestive) heart failure: Secondary | ICD-10-CM | POA: Insufficient documentation

## 2022-03-12 DIAGNOSIS — K2100 Gastro-esophageal reflux disease with esophagitis, without bleeding: Secondary | ICD-10-CM | POA: Insufficient documentation

## 2022-03-12 DIAGNOSIS — E079 Disorder of thyroid, unspecified: Secondary | ICD-10-CM | POA: Diagnosis not present

## 2022-03-12 DIAGNOSIS — Z7901 Long term (current) use of anticoagulants: Secondary | ICD-10-CM | POA: Insufficient documentation

## 2022-03-12 DIAGNOSIS — J449 Chronic obstructive pulmonary disease, unspecified: Secondary | ICD-10-CM

## 2022-03-12 DIAGNOSIS — K2289 Other specified disease of esophagus: Secondary | ICD-10-CM | POA: Diagnosis not present

## 2022-03-12 DIAGNOSIS — Z87891 Personal history of nicotine dependence: Secondary | ICD-10-CM | POA: Diagnosis not present

## 2022-03-12 DIAGNOSIS — I2510 Atherosclerotic heart disease of native coronary artery without angina pectoris: Secondary | ICD-10-CM | POA: Diagnosis not present

## 2022-03-12 DIAGNOSIS — I509 Heart failure, unspecified: Secondary | ICD-10-CM | POA: Diagnosis not present

## 2022-03-12 DIAGNOSIS — K222 Esophageal obstruction: Secondary | ICD-10-CM | POA: Diagnosis not present

## 2022-03-12 DIAGNOSIS — Z8249 Family history of ischemic heart disease and other diseases of the circulatory system: Secondary | ICD-10-CM | POA: Insufficient documentation

## 2022-03-12 DIAGNOSIS — I739 Peripheral vascular disease, unspecified: Secondary | ICD-10-CM | POA: Diagnosis not present

## 2022-03-12 DIAGNOSIS — K317 Polyp of stomach and duodenum: Secondary | ICD-10-CM | POA: Diagnosis not present

## 2022-03-12 DIAGNOSIS — Z79899 Other long term (current) drug therapy: Secondary | ICD-10-CM | POA: Insufficient documentation

## 2022-03-12 DIAGNOSIS — R1310 Dysphagia, unspecified: Secondary | ICD-10-CM | POA: Diagnosis not present

## 2022-03-12 DIAGNOSIS — K225 Diverticulum of esophagus, acquired: Secondary | ICD-10-CM | POA: Diagnosis not present

## 2022-03-12 DIAGNOSIS — I252 Old myocardial infarction: Secondary | ICD-10-CM | POA: Insufficient documentation

## 2022-03-12 DIAGNOSIS — B3781 Candidal esophagitis: Secondary | ICD-10-CM | POA: Insufficient documentation

## 2022-03-12 DIAGNOSIS — Z9981 Dependence on supplemental oxygen: Secondary | ICD-10-CM | POA: Insufficient documentation

## 2022-03-12 HISTORY — PX: SAVORY DILATION: SHX5439

## 2022-03-12 HISTORY — PX: ESOPHAGOGASTRODUODENOSCOPY (EGD) WITH PROPOFOL: SHX5813

## 2022-03-12 HISTORY — PX: BIOPSY: SHX5522

## 2022-03-12 SURGERY — ESOPHAGOGASTRODUODENOSCOPY (EGD) WITH PROPOFOL
Anesthesia: Monitor Anesthesia Care

## 2022-03-12 MED ORDER — SODIUM CHLORIDE 0.9 % IV SOLN: INTRAVENOUS | Status: DC

## 2022-03-12 MED ORDER — LIDOCAINE HCL (PF) 2 % IJ SOLN
INTRAMUSCULAR | Status: DC | PRN
  Administered 2022-03-12: 40 mg via INTRADERMAL

## 2022-03-12 MED ORDER — PROPOFOL 500 MG/50ML IV EMUL
INTRAVENOUS | Status: DC | PRN
  Administered 2022-03-12: 100 ug/kg/min via INTRAVENOUS

## 2022-03-12 MED ORDER — LACTATED RINGERS IV SOLN: INTRAVENOUS | Status: DC

## 2022-03-12 MED ORDER — FLUCONAZOLE 100 MG PO TABS: 200.0000 mg | ORAL_TABLET | Freq: Every day | ORAL | 0 refills | Status: AC

## 2022-03-12 SURGICAL SUPPLY — 15 items

## 2022-03-12 NOTE — Interval H&P Note (Signed)
History and Physical Interval Note:  She has been holding her Eliquis for 2 days for procedure today.  Of note, only takes her pantoprazole intermittently rather than bid as prescribed.  03/12/2022 9:04 AM  Tonya Harper  has presented today for surgery, with the diagnosis of dysphagia/esophageal stricture.  The various methods of treatment have been discussed with the patient and family. After consideration of risks, benefits and other options for treatment, the patient has consented to  Procedure(s): ESOPHAGOGASTRODUODENOSCOPY (EGD) WITH PROPOFOL (N/A) BALLOON DILATION (N/A) as a surgical intervention.  The patient's history has been reviewed, patient examined, no change in status, stable for surgery.  I have reviewed the patient's chart and labs.  Questions were answered to the patient's satisfaction.     Dominic Pea [name]

## 2022-03-12 NOTE — Anesthesia Preprocedure Evaluation (Signed)
Anesthesia Evaluation  Patient identified by MRN, date of birth, ID band Patient awake    Reviewed: Allergy & Precautions, H&P , NPO status , Patient's Chart, lab work & pertinent test results  Airway Mallampati: I  TM Distance: >3 FB Neck ROM: Full    Dental no notable dental hx.    Pulmonary shortness of breath and with exertion, COPD, former smoker   Pulmonary exam normal breath sounds clear to auscultation       Cardiovascular hypertension, + CAD, + Past MI, + Peripheral Vascular Disease and +CHF  Normal cardiovascular exam Rhythm:Regular Rate:Normal     Neuro/Psych negative neurological ROS  negative psych ROS   GI/Hepatic negative GI ROS, Neg liver ROS,,,  Endo/Other  Hypothyroidism    Renal/GU negative Renal ROS  negative genitourinary   Musculoskeletal negative musculoskeletal ROS (+)    Abdominal   Peds negative pediatric ROS (+)  Hematology negative hematology ROS (+)   Anesthesia Other Findings   Reproductive/Obstetrics negative OB ROS                             Anesthesia Physical Anesthesia Plan  ASA: 3  Anesthesia Plan: MAC   Post-op Pain Management: Minimal or no pain anticipated   Induction: Intravenous  PONV Risk Score and Plan: 2 and Propofol infusion and Treatment may vary due to age or medical condition  Airway Management Planned: Simple Face Mask  Additional Equipment:   Intra-op Plan:   Post-operative Plan:   Informed Consent: I have reviewed the patients History and Physical, chart, labs and discussed the procedure including the risks, benefits and alternatives for the proposed anesthesia with the patient or authorized representative who has indicated his/her understanding and acceptance.     Dental advisory given  Plan Discussed with: CRNA and Surgeon  Anesthesia Plan Comments:        Anesthesia Quick Evaluation

## 2022-03-12 NOTE — Discharge Instructions (Signed)
YOU HAD AN ENDOSCOPIC PROCEDURE TODAY: Refer to the procedure report and other information in the discharge instructions given to you for any specific questions about what was found during the examination. If this information does not answer your questions, please call [place] office at [phone] to clarify.  ° °YOU SHOULD EXPECT: Some feelings of bloating in the abdomen. Passage of more gas than usual. Walking can help get rid of the air that was put into your GI tract during the procedure and reduce the bloating. If you had a lower endoscopy (such as a colonoscopy or flexible sigmoidoscopy) you may notice spotting of blood in your stool or on the toilet paper. Some abdominal soreness may be present for a day or two, also. ° °DIET: Your first meal following the procedure should be a light meal and then it is ok to progress to your normal diet. A half-sandwich or bowl of soup is an example of a good first meal. Heavy or fried foods are harder to digest and may make you feel nauseous or bloated. Drink plenty of fluids but you should avoid alcoholic beverages for 24 hours. If you had a esophageal dilation, please see attached instructions for diet.   ° °ACTIVITY: Your care partner should take you home directly after the procedure. You should plan to take it easy, moving slowly for the rest of the day. You can resume normal activity the day after the procedure however YOU SHOULD NOT DRIVE, use power tools, machinery or perform tasks that involve climbing or major physical exertion for 24 hours (because of the sedation medicines used during the test).  ° °SYMPTOMS TO REPORT IMMEDIATELY: °A gastroenterologist can be reached at any hour. Please call [phone]  for any of the following symptoms:  °Following lower endoscopy (colonoscopy, flexible sigmoidoscopy) °Excessive amounts of blood in the stool  °Significant tenderness, worsening of abdominal pains  °Swelling of the abdomen that is new, acute  °Fever of 100° or  higher  °Following upper endoscopy (EGD, EUS, ERCP, esophageal dilation) °Vomiting of blood or coffee ground material  °New, significant abdominal pain  °New, significant chest pain or pain under the shoulder blades  °Painful or persistently difficult swallowing  °New shortness of breath  °Black, tarry-looking or red, bloody stools ° °FOLLOW UP:  °If any biopsies were taken you will be contacted by phone or by letter within the next 1-3 weeks. Call [phone]  if you have not heard about the biopsies in 3 weeks.  °Please also call with any specific questions about appointments or follow up tests. ° °

## 2022-03-12 NOTE — Op Note (Signed)
[facility] Patient Name: Tonya Harper Procedure Date: 03/12/2022 MRN: [id number] Attending MD: Gerrit Heck , MD, [id number] Date of Birth: [birth year] CSN: [id number] Age: 77 Admit Type: Outpatient Procedure:                Upper GI endoscopy w/ Savary dilation and biopsy Indications:              Dysphagia, For therapy of esophageal stenosis Providers:                Gerrit Heck, MD, Dulcy Fanny, Janee Morn, Technician Referring MD:              Medicines:                Monitored Anesthesia Care Complications:            No immediate complications. Estimated Blood Loss:     Estimated blood loss was minimal. Procedure:                Pre-Anesthesia Assessment:                           - Prior to the procedure, a History and Physical                            was performed, and patient medications and                            allergies were reviewed. The patient's tolerance of                            previous anesthesia was also reviewed. The risks                            and benefits of the procedure and the sedation                            options and risks were discussed with the patient.                            All questions were answered, and informed consent                            was obtained. Prior Anticoagulants: The patient has                            taken Eliquis (apixaban), last dose was 2 days                            prior to procedure. ASA Grade Assessment: III - A                            patient with severe systemic disease. After  reviewing the risks and benefits, the patient was                            deemed in satisfactory condition to undergo the                            procedure.                           After obtaining informed consent, the endoscope was                            passed under direct vision. Throughout the                             procedure, the patient's blood pressure, pulse, and                            oxygen saturations were monitored continuously. The                            GIF-H190 [id number]) Olympus endoscope was introduced                            through the mouth, and advanced to the middle third                            of esophagus. Could not advance any further due to                            high grade stricture. This endoscope was removed.                            The GIF-[id number] Olympus slim endoscope was                            introduced through the mouth, and advanced to the                            second part of duodenum. The upper GI endoscopy was                            technically difficult and complex due to esophageal                            stricturing disease. Successful completion of the                            procedure was aided by withdrawing the scope and                            replacing with the UltraSlim scope. The patient  tolerated the procedure well. Scope In: Scope Out: Findings:      Multiple benign-appearing, intrinsic moderate stenoses were found       between 28 to 40 cm from the incisors. The esophagus had a scarred       appearance in these areas. The narrowest stenosis measured 7 mm (inner       diameter) at 35 cm from the incisors. Could not travserse with the       standard endoscope, so this was exchanged for the Ultraslim scope. The       stenoses were traversed after downsizing scope. A guidewire was placed       and the scope was withdrawn. Dilation was performed with a Savary       dilator with mild resistance at 8 mm and 10 mm. The dilation site was       examined following endoscope reinsertion and showed 4 separate mild       mucosal disruptions and overall moderate improvement in luminal       narrowing. Estimated blood loss was minimal.      The Z-line was regular and was found 40  cm from the incisors.      Diffuse, white plaques were found in the middle third of the esophagus       and in the lower third of the esophagus. There were several       pseudodiverticula in the mid and lower esophagus. Biopsies were taken       with a cold forceps for histology. Estimated blood loss was minimal.      The entire examined stomach was normal.      The examined duodenum was normal.      A single 7 mm polyp with no bleeding was found 35 cm from the incisors.       This has been previously biopsied several times and consistent with       benign squamous papilloma. This was not resampled. Impression:               - Benign-appearing esophageal stenoses in the mid                            and lower esophagus with luminal narrowing and                            scarred appearance. This was only traversable with                            the ultraslim scope, consistent with progressive                            disease. The entire esophagus was dilation with an                            8 and 10 mm Savary dilator, with several                            appropriate post dilation mucosal defects,                            consistent with successful dilation.                           -  Z-line regular, 40 cm from the incisors.                           - Esophageal plaques were found, consistent with                            candidiasis. Biopsied.                           - Normal stomach.                           - Normal examined duodenum. Moderate Sedation:      Not Applicable - Patient had care per Anesthesia. Recommendation:           - Patient has a contact number available for                            emergencies. The signs and symptoms of potential                            delayed complications were discussed with the                            patient. Return to normal activities tomorrow.                            Written discharge instructions were  provided to the                            patient.                           - Soft diet.                           - Await pathology results.                           - Resume Eliquis (apixaban) at prior dose tomorrow.                           - Use Protonix (pantoprazole) 40 mg PO BID. This                            needs to be taken as prescribed rather than on an                            as needed basis.                           - Diflucan (fluconazole) 400 mg x1 then 200 mg PO                            daily for 3 weeks.                           -  Repeat upper endoscopy in 6-8 weeks to check                            healing and for retreatment.                           - Return to GI office at appointment to be                            scheduled. Procedure Code(s):        --- Professional ---                           [phone], Esophagogastroduodenoscopy, flexible,                            transoral; with insertion of guide wire followed by                            passage of dilator(s) through esophagus over guide                            wire                           43239, 23, Esophagogastroduodenoscopy, flexible,                            transoral; with biopsy, single or multiple Diagnosis Code(s):        --- Professional ---                           K22.2, Esophageal obstruction                           K22.9, Disease of esophagus, unspecified                           R13.10, Dysphagia, unspecified CPT copyright 2022 American Medical Association. All rights reserved. The codes documented in this report are preliminary and upon coder review may  be revised to meet current compliance requirements. Gerrit Heck, MD 03/12/2022 10:11:30 AM Number of Addenda: 0

## 2022-03-12 NOTE — Anesthesia Postprocedure Evaluation (Signed)
Anesthesia Post Note  Patient: Tonya Harper  Procedure(s) Performed: ESOPHAGOGASTRODUODENOSCOPY (EGD) WITH PROPOFOL SAVORY DILATION BIOPSY     Patient location during evaluation: PACU Anesthesia Type: MAC Level of consciousness: awake and alert Pain management: pain level controlled Vital Signs Assessment: post-procedure vital signs reviewed and stable Respiratory status: spontaneous breathing, nonlabored ventilation, respiratory function stable and patient connected to nasal cannula oxygen Cardiovascular status: stable and blood pressure returned to baseline Postop Assessment: no apparent nausea or vomiting Anesthetic complications: no  No notable events documented.  Last Vitals:  Vitals:   03/12/22 1005 03/12/22 1010  BP: (!) 125/53 (!) 133/55  Pulse: 76 64  Resp: 17 17  Temp: 36.6 C   SpO2: 100% 100%    Last Pain:  Vitals:   03/12/22 1010  TempSrc:   PainSc: 0-No pain                 Ryan Ogborn S

## 2022-03-12 NOTE — Transfer of Care (Signed)
Immediate Anesthesia Transfer of Care Note  Patient: Tonya Harper  Procedure(s) Performed: ESOPHAGOGASTRODUODENOSCOPY (EGD) WITH PROPOFOL SAVORY DILATION BIOPSY  Patient Location: PACU and Endoscopy Unit  Anesthesia Type:MAC  Level of Consciousness: awake, alert , oriented, and patient cooperative  Airway & Oxygen Therapy: Patient Spontanous Breathing and Patient connected to nasal cannula oxygen  Post-op Assessment: Report given to RN, Post -op Vital signs reviewed and stable, and Patient moving all extremities  Post vital signs: Reviewed and stable  Last Vitals:  Vitals Value Taken Time  BP    Temp    Pulse 68 03/12/22 1006  Resp 21 03/12/22 1006  SpO2 100 % 03/12/22 1006  Vitals shown include unvalidated device data.  Last Pain:  Vitals:   03/12/22 0831  TempSrc: Temporal  PainSc: 0-No pain         Complications: No notable events documented.

## 2022-03-13 ENCOUNTER — Encounter (HOSPITAL_COMMUNITY): Payer: Self-pay | Admitting: Gastroenterology

## 2022-03-13 LAB — SURGICAL PATHOLOGY

## 2022-03-22 ENCOUNTER — Telehealth: Payer: Self-pay

## 2022-03-22 DIAGNOSIS — Z9981 Dependence on supplemental oxygen: Secondary | ICD-10-CM

## 2022-03-22 DIAGNOSIS — R1310 Dysphagia, unspecified: Secondary | ICD-10-CM

## 2022-03-22 DIAGNOSIS — K222 Esophageal obstruction: Secondary | ICD-10-CM

## 2022-03-22 DIAGNOSIS — Z7901 Long term (current) use of anticoagulants: Secondary | ICD-10-CM

## 2022-03-22 NOTE — Telephone Encounter (Signed)
-----   Message from Yolo, DO sent at 03/12/2022 10:27 AM EST ----- EGD completed today and multiple strictures and a scarred esophagus in the mid/distal esophagus.  Could not traverse with standard endoscope, so I converted to ultraslim scope.  Dilated with 10 mm Savary with several mucosal rents.  Also with esophageal candidiasis (biopsied, starting on fluconazole).  She has been taking her pantoprazole, so reiterated the importance of taking as prescribed.    Will need repeat upper endoscopy in 6-8 weeks at [facility] for continued dilation and evaluate for resolution of candidiasis.  [name], if you have no availability, I should be able to find a spot for her.  Thanks.

## 2022-04-02 ENCOUNTER — Other Ambulatory Visit: Payer: Self-pay

## 2022-04-02 DIAGNOSIS — Z9981 Dependence on supplemental oxygen: Secondary | ICD-10-CM

## 2022-04-02 DIAGNOSIS — R1310 Dysphagia, unspecified: Secondary | ICD-10-CM

## 2022-04-02 DIAGNOSIS — Z7901 Long term (current) use of anticoagulants: Secondary | ICD-10-CM

## 2022-04-02 DIAGNOSIS — K222 Esophageal obstruction: Secondary | ICD-10-CM

## 2022-04-02 NOTE — Telephone Encounter (Signed)
Called and spoke with patient regarding scheduling her repeat EGD with dilation. Pt wanted to be scheduled with Dr. Hilarie Fredrickson, I explained to her that his next available appt was not until 4/30 which is past the recommended time frame of her repeat procedure. Pt is aware that Dr. Hilarie Fredrickson is still her primary GI provider. Pt has been scheduled for EGD with balloon dilation on Wednesday, 05/08/22 at 8:30 am. Pt will need to arrive at [facility] by 7 am with a care partner. Pt is aware that I will mail her instructions to her, she confirmed PO box on file. Pt verbalized understanding of information and had no concerns at the end of the call.   Ambulatory referral to GI in epic. EGD instructions mailed to patient. Cardiac clearance obtained on 03/07/22 - see telephone encounter for details.

## 2022-04-05 DIAGNOSIS — Z961 Presence of intraocular lens: Secondary | ICD-10-CM | POA: Diagnosis not present

## 2022-04-05 DIAGNOSIS — H1045 Other chronic allergic conjunctivitis: Secondary | ICD-10-CM | POA: Diagnosis not present

## 2022-04-05 DIAGNOSIS — H35373 Puckering of macula, bilateral: Secondary | ICD-10-CM | POA: Diagnosis not present

## 2022-04-30 DIAGNOSIS — Z1231 Encounter for screening mammogram for malignant neoplasm of breast: Secondary | ICD-10-CM | POA: Diagnosis not present

## 2022-05-01 ENCOUNTER — Encounter (HOSPITAL_COMMUNITY): Payer: Self-pay | Admitting: Gastroenterology

## 2022-05-08 ENCOUNTER — Encounter (HOSPITAL_COMMUNITY): Payer: Self-pay | Admitting: Gastroenterology

## 2022-05-08 ENCOUNTER — Encounter (HOSPITAL_COMMUNITY)
Admission: RE | Disposition: A | Discharge status: Home / Self Care | Payer: Self-pay | Source: Home / Self Care | Attending: Gastroenterology

## 2022-05-08 ENCOUNTER — Ambulatory Visit (HOSPITAL_COMMUNITY)
Admission: RE | Admit: 2022-05-08 | Discharge: 2022-05-08 | Disposition: A | Discharge status: Home / Self Care | Payer: Medicare Other | Attending: Gastroenterology | Admitting: Gastroenterology

## 2022-05-08 ENCOUNTER — Ambulatory Visit (HOSPITAL_BASED_OUTPATIENT_CLINIC_OR_DEPARTMENT_OTHER): Payer: Medicare Other | Admitting: Certified Registered Nurse Anesthetist

## 2022-05-08 ENCOUNTER — Other Ambulatory Visit: Payer: Self-pay

## 2022-05-08 ENCOUNTER — Ambulatory Visit (HOSPITAL_COMMUNITY): Payer: Medicare Other | Admitting: Certified Registered Nurse Anesthetist

## 2022-05-08 ENCOUNTER — Telehealth: Payer: Self-pay

## 2022-05-08 DIAGNOSIS — I2510 Atherosclerotic heart disease of native coronary artery without angina pectoris: Secondary | ICD-10-CM | POA: Insufficient documentation

## 2022-05-08 DIAGNOSIS — Z7901 Long term (current) use of anticoagulants: Secondary | ICD-10-CM | POA: Diagnosis not present

## 2022-05-08 DIAGNOSIS — K2210 Ulcer of esophagus without bleeding: Secondary | ICD-10-CM | POA: Insufficient documentation

## 2022-05-08 DIAGNOSIS — J449 Chronic obstructive pulmonary disease, unspecified: Secondary | ICD-10-CM | POA: Insufficient documentation

## 2022-05-08 DIAGNOSIS — R1310 Dysphagia, unspecified: Secondary | ICD-10-CM | POA: Diagnosis not present

## 2022-05-08 DIAGNOSIS — B3781 Candidal esophagitis: Secondary | ICD-10-CM | POA: Diagnosis not present

## 2022-05-08 DIAGNOSIS — K222 Esophageal obstruction: Secondary | ICD-10-CM

## 2022-05-08 DIAGNOSIS — Z8249 Family history of ischemic heart disease and other diseases of the circulatory system: Secondary | ICD-10-CM | POA: Insufficient documentation

## 2022-05-08 DIAGNOSIS — Z87891 Personal history of nicotine dependence: Secondary | ICD-10-CM | POA: Diagnosis not present

## 2022-05-08 DIAGNOSIS — Z86010 Personal history of colonic polyps: Secondary | ICD-10-CM | POA: Diagnosis not present

## 2022-05-08 DIAGNOSIS — I252 Old myocardial infarction: Secondary | ICD-10-CM | POA: Diagnosis not present

## 2022-05-08 DIAGNOSIS — Q396 Congenital diverticulum of esophagus: Secondary | ICD-10-CM | POA: Diagnosis not present

## 2022-05-08 DIAGNOSIS — K229 Disease of esophagus, unspecified: Secondary | ICD-10-CM | POA: Diagnosis not present

## 2022-05-08 DIAGNOSIS — Z9981 Dependence on supplemental oxygen: Secondary | ICD-10-CM

## 2022-05-08 DIAGNOSIS — I10 Essential (primary) hypertension: Secondary | ICD-10-CM

## 2022-05-08 DIAGNOSIS — B9681 Helicobacter pylori [H. pylori] as the cause of diseases classified elsewhere: Secondary | ICD-10-CM | POA: Diagnosis not present

## 2022-05-08 DIAGNOSIS — Z79899 Other long term (current) drug therapy: Secondary | ICD-10-CM | POA: Insufficient documentation

## 2022-05-08 HISTORY — PX: ESOPHAGOGASTRODUODENOSCOPY (EGD) WITH PROPOFOL: SHX5813

## 2022-05-08 HISTORY — PX: BIOPSY: SHX5522

## 2022-05-08 HISTORY — PX: BALLOON DILATION: SHX5330

## 2022-05-08 SURGERY — ESOPHAGOGASTRODUODENOSCOPY (EGD) WITH PROPOFOL
Anesthesia: Monitor Anesthesia Care

## 2022-05-08 MED ORDER — PROPOFOL 10 MG/ML IV BOLUS
INTRAVENOUS | Status: DC | PRN
  Administered 2022-05-08: 20 mg via INTRAVENOUS

## 2022-05-08 MED ORDER — LACTATED RINGERS IV SOLN: INTRAVENOUS | Status: DC | PRN

## 2022-05-08 MED ORDER — PROPOFOL 500 MG/50ML IV EMUL
INTRAVENOUS | Status: DC | PRN
  Administered 2022-05-08: 140 ug/kg/min via INTRAVENOUS

## 2022-05-08 MED ORDER — LACTATED RINGERS IV SOLN: INTRAVENOUS | Status: DC

## 2022-05-08 MED ORDER — SODIUM CHLORIDE 0.9 % IV SOLN: INTRAVENOUS | Status: DC

## 2022-05-08 MED ORDER — PROPOFOL 500 MG/50ML IV EMUL
INTRAVENOUS | Status: AC
  Filled 2022-05-08: qty 50

## 2022-05-08 MED ORDER — PROPOFOL 500 MG/50ML IV EMUL
INTRAVENOUS | Status: AC
  Filled 2022-05-08: qty 150

## 2022-05-08 SURGICAL SUPPLY — 15 items

## 2022-05-08 NOTE — Op Note (Signed)
[facility] Patient Name: Tonya LighterRuth Harper Procedure Date: 05/08/2022 MRN: [id number] Attending MD: Doristine LocksVito Amous Crewe , MD, [phone] Date of Birth: 10/27/1945 CSN: [id number] Age: [age] Admit Type: Outpatient Procedure:                Upper GI endoscopy with balloon dilation and biopsy Indications:              Dysphagia, Follow-up of esophageal stenosis, For                            therapy of esophageal stenosis                           Known history of persistent/recurrent esophageal                            strictures. EGD 9/ 6/ 22 - - balloon dilation in 15                            mm; EGD 8/ 4/ 22 - - Savary dilation to 12. 8 mm.                            Food impaction [facility]                            12/2021. Last EGD was 03/12/2022 and notable for                            multiple benign-appearing, intrinsic moderate                            stenoses found between 28?"to 40 cm from incisors                            with scarred appearance throughout this area. The                            narrowest stenotic area was located at 35 cm                            measuring 7 mm inner diameter and required                            ultraslim scope to traverse. Esophagus dilated with                            8 and 10 mm Savary dilator with 4 separate mucosal                            disruptions and improvement in luminal diameter.                            Also noted esophageal candidiasis in the mid and  lower esophagus with several pseudodiverticula,                            treated with fluconazole. 7 mm polyp at 35 cm                            consistent with known benign squamous papilloma.                            Also recommended patient start taking Protonix 40                            mg twice daily as prescribed (was taking on demand                            only) with repeat upper  endoscopy in 6-8 weeks.                           Today, she states she had significant improvement                            in her dysphagia after the most recent upper                            endoscopy. Completed fluconazole x 3 weeks. Still                            taking Protonix 40 mg twice daily as prescribed. Providers:                Doristine Locks, MD, Norman Clay, RN, Leanne Lovely, Technician Referring MD:              Medicines:                Monitored Anesthesia Care Complications:            No immediate complications. Estimated Blood Loss:     Estimated blood loss was minimal. Procedure:                Pre-Anesthesia Assessment:                           - Prior to the procedure, a History and Physical                            was performed, and patient medications and                            allergies were reviewed. The patient's tolerance of                            previous anesthesia was also reviewed. The risks  and benefits of the procedure and the sedation                            options and risks were discussed with the patient.                            All questions were answered, and informed consent                            was obtained. Prior Anticoagulants: The patient has                            taken Eliquis (apixaban), last dose was 2 days                            prior to procedure. ASA Grade Assessment: III - A                            patient with severe systemic disease. After                            reviewing the risks and benefits, the patient was                            deemed in satisfactory condition to undergo the                            procedure.                           After obtaining informed consent, the endoscope was                            passed under direct vision. Throughout the                            procedure, the patient's blood  pressure, pulse, and                            oxygen saturations were monitored continuously. The                            GIF-H190 [id number] Olympus endoscope was introduced                            through the mouth, and advanced to the second part                            of duodenum. The upper GI endoscopy was                            accomplished without difficulty. The patient  tolerated the procedure well. Scope In: Scope Out: Findings:      Multiple benign-appearing, intrinsic moderate stenoses were found 28 to       40 cm from the incisors. The narrowest stenosis measured 7 mm (inner       diameter). The stenoses were traversed. The mid and lower esophagus had       a scarred appearance. A TTS dilator was passed through the scope.       Dilation with an 09-05-08 mm balloon dilator was performed to 10 mm. The       dilation site was examined and showed a few mild mucosal disruptions and       overall moderate improvement in luminal narrowing. Several of these       strictures were then further fractured using cold forceps. Estimated       blood loss was minimal.      A single 7 mm polyp with no bleeding was found 35 cm from the incisors.       This is consistent with a known benign squamous papilloma, and not       further biopsied today.      Multiple white plaques were found in the middle third of the esophagus       and in the lower third of the esophagus. This did clear somewhat with       lavage. Biopsies were taken with a cold forceps for histology and       evaluation for ongoing/recurrent candidiasis. Estimated blood loss was       minimal.      Multiple small pseudodiverticula were found in the middle third of the       esophagus and in the lower third of the esophagus.      The entire examined stomach was normal.      The examined duodenum was normal. Impression:               - Benign-appearing esophageal stenoses. Dilated                             with 10 mm TTS balloon with several, appropriate,                            small mucosal rents, consistent with successful                            dilation. Several strictures were further fractured                            using cold forceps.                           - Esophageal plaques were found, suspicious for                            candidiasis. Biopsied. \                           - Multiple small esophageal pseudodiverticula noted.                           - Normal stomach.                           -  Normal examined duodenum. Moderate Sedation:      Not Applicable - Patient had care per Anesthesia. Recommendation:           - Patient has a contact number available for                            emergencies. The signs and symptoms of potential                            delayed complications were discussed with the                            patient. Return to normal activities tomorrow.                            Written discharge instructions were provided to the                            patient.                           - Soft diet today, then slowly advance as tolerated.                           - Continue present medications.                           - Await pathology results. If again consistent with                            Candidiasis, will plan on repeat course of                            fluconazole with possible referral to the                            [facility] if continues to recur.                           - Continue high dose PPI for the time being.                           - Repeat upper endoscopy in 6-8 weeks for                            retreatment. This can be scheduled with me or her                            primary Gastroenterologist, Dr. Rhea Belton. Procedure Code(s):        --- Professional ---                           [phone], Esophagogastroduodenoscopy, flexible,  transoral; with transendoscopic balloon dilation of                            esophagus (less than 30 mm diameter)                           43239, 59, Esophagogastroduodenoscopy, flexible,                            transoral; with biopsy, single or multiple Diagnosis Code(s):        --- Professional ---                           K22.2, Esophageal obstruction                           K22.9, Disease of esophagus, unspecified                           R13.10, Dysphagia, unspecified CPT copyright 2022 American Medical Association. All rights reserved. The codes documented in this report are preliminary and upon coder review may  be revised to meet current compliance requirements. Doristine Locks, MD 05/08/2022 9:26:20 AM Number of Addenda: 0

## 2022-05-08 NOTE — Anesthesia Preprocedure Evaluation (Signed)
Anesthesia Evaluation  Patient identified by MRN, date of birth, ID band Patient awake    Reviewed: Allergy & Precautions, H&P , NPO status , Patient's Chart, lab work & pertinent test results  Airway Mallampati: II  TM Distance: >3 FB Neck ROM: Full    Dental no notable dental hx.    Pulmonary COPD, former smoker   Pulmonary exam normal breath sounds clear to auscultation       Cardiovascular hypertension, Pt. on medications + CAD, + Past MI and + Peripheral Vascular Disease  Normal cardiovascular exam Rhythm:Regular Rate:Normal     Neuro/Psych negative neurological ROS  negative psych ROS   GI/Hepatic negative GI ROS, Neg liver ROS,,,  Endo/Other  Hypothyroidism    Renal/GU Renal InsufficiencyRenal disease  negative genitourinary   Musculoskeletal negative musculoskeletal ROS (+)    Abdominal   Peds negative pediatric ROS (+)  Hematology  (+) Blood dyscrasia, anemia   Anesthesia Other Findings   Reproductive/Obstetrics negative OB ROS                             Anesthesia Physical Anesthesia Plan  ASA: 3  Anesthesia Plan: MAC   Post-op Pain Management: Minimal or no pain anticipated   Induction: Intravenous  PONV Risk Score and Plan: 2 and Ondansetron, Midazolam and Treatment may vary due to age or medical condition  Airway Management Planned: Nasal Cannula  Additional Equipment:   Intra-op Plan:   Post-operative Plan:   Informed Consent: I have reviewed the patients History and Physical, chart, labs and discussed the procedure including the risks, benefits and alternatives for the proposed anesthesia with the patient or authorized representative who has indicated his/her understanding and acceptance.     Dental advisory given  Plan Discussed with: CRNA  Anesthesia Plan Comments:        Anesthesia Quick Evaluation

## 2022-05-08 NOTE — Transfer of Care (Signed)
Immediate Anesthesia Transfer of Care Note  Patient: Tonya Harper  Procedure(s) Performed: ESOPHAGOGASTRODUODENOSCOPY (EGD) WITH PROPOFOL BALLOON DILATION BIOPSY  Patient Location: PACU  Anesthesia Type:MAC  Level of Consciousness: awake and alert   Airway & Oxygen Therapy: Patient Spontanous Breathing and Patient connected to nasal cannula oxygen  Post-op Assessment: Report given to RN and Post -op Vital signs reviewed and stable  Post vital signs: Reviewed and stable  Last Vitals:  Vitals Value Taken Time  BP 95/42 05/08/22 0910  Temp    Pulse 65 05/08/22 0910  Resp 16 05/08/22 0910  SpO2 100 % 05/08/22 0910  Vitals shown include unvalidated device data.  Last Pain:  Vitals:   05/08/22 0812  TempSrc: Tympanic  PainSc: 0-No pain         Complications: No notable events documented.

## 2022-05-08 NOTE — Telephone Encounter (Signed)
-----   Message from Beverley Fiedler, MD sent at 05/08/2022 11:40 AM EDT ----- I like your plan I agree that you would see likely will offer any additional therapy, unless; would they ever stent this and then go back and remove it? Sundra Haddix, schedule with me or VC in 6 weeks JMP ----- Message ----- From: Shellia Cleverly, DO Sent: 05/08/2022   9:46 AM EDT To: Beverley Fiedler, MD; Missy Sabins, RN  Repeat EGD completed at [facility] today.  While she reports feeling better since the last EGD, she still has persistent esophageal stenosis with a scarred appearance and multiple strictures in the lower 12 cm of the esophagus.  Dilated with 10 mm TTS balloon with several appropriate mucosal rents.  Possibly some remnant of previous esophageal candidiasis (biopsied).  She will need another upper endoscopy in about 6 weeks.  This can be scheduled with me or Dr. Rhea Belton as schedules allow.  Vonna Kotyk, I am thinking if she has a persistent stricture not allowing [place] to dilate beyond 10 mm or so, particularly the stricture at the GE junction, maybe we inject with Kenalog next time as well?  Not sure if there is any role to refer her over to [facility], as I think they would plan for the same approach of serial dilations.  Not sure if she would be a candidate for needle-knife over there since it is a long segment stenosis with multiple strictures.  Thanks.  VC

## 2022-05-08 NOTE — Interval H&P Note (Signed)
History and Physical Interval Note:  05/08/2022 8:34 AM  Tonya Harper  has presented today for surgery, with the diagnosis of dysphagia/esophageal stricture.  The various methods of treatment have been discussed with the patient and family. After consideration of risks, benefits and other options for treatment, the patient has consented to  Procedure(s): ESOPHAGOGASTRODUODENOSCOPY (EGD) WITH PROPOFOL (N/A) BALLOON DILATION (N/A) as a surgical intervention.  The patient's history has been reviewed, patient examined, no change in status, stable for surgery.  I have reviewed the patient's chart and labs.  Questions were answered to the patient's satisfaction.     Verlin Dike Leibish Mcgregor
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
## 2022-05-08 NOTE — Anesthesia Postprocedure Evaluation (Signed)
Anesthesia Post Note  Patient: Tonya Harper  Procedure(s) Performed: ESOPHAGOGASTRODUODENOSCOPY (EGD) WITH PROPOFOL BALLOON DILATION BIOPSY     Patient location during evaluation: PACU Anesthesia Type: MAC Level of consciousness: awake and alert Pain management: pain level controlled Vital Signs Assessment: post-procedure vital signs reviewed and stable Respiratory status: spontaneous breathing, nonlabored ventilation and respiratory function stable Cardiovascular status: blood pressure returned to baseline and stable Postop Assessment: no apparent nausea or vomiting Anesthetic complications: no   No notable events documented.  Last Vitals:  Vitals:   05/08/22 0924 05/08/22 0929  BP: (!) 115/57 118/60  Pulse: 65 69  Resp: 17 16  Temp:    SpO2: 98% 97%    Last Pain:  Vitals:   05/08/22 0929  TempSrc:   PainSc: 0-No pain                 Lowella Curb

## 2022-05-08 NOTE — Discharge Instructions (Signed)
YOU HAD AN ENDOSCOPIC PROCEDURE TODAY: Refer to the procedure report and other information in the discharge instructions given to you for any specific questions about what was found during the examination. If this information does not answer your questions, please call [place] office at [phone] to clarify.   YOU SHOULD EXPECT: Some feelings of bloating in the abdomen. Passage of more gas than usual. Walking can help get rid of the air that was put into your GI tract during the procedure and reduce the bloating. If you had a lower endoscopy (such as a colonoscopy or flexible sigmoidoscopy) you may notice spotting of blood in your stool or on the toilet paper. Some abdominal soreness may be present for a day or two, also.  DIET: Your first meal following the procedure should be a light meal and then it is ok to progress to your normal diet. A half-sandwich or bowl of soup is an example of a good first meal. Heavy or fried foods are harder to digest and may make you feel nauseous or bloated. Drink plenty of fluids but you should avoid alcoholic beverages for 24 hours. If you had a esophageal dilation, please see attached instructions for diet.    ACTIVITY: Your care partner should take you home directly after the procedure. You should plan to take it easy, moving slowly for the rest of the day. You can resume normal activity the day after the procedure however YOU SHOULD NOT DRIVE, use power tools, machinery or perform tasks that involve climbing or major physical exertion for 24 hours (because of the sedation medicines used during the test).   SYMPTOMS TO REPORT IMMEDIATELY: A gastroenterologist can be reached at any hour. Please call [phone]  for any of the following symptoms:  Following upper endoscopy (EGD, esophageal dilation) Vomiting of blood or coffee ground material  New, significant abdominal pain  New, significant chest pain or pain under the shoulder blades  Painful or persistently  difficult swallowing  New shortness of breath  Black, tarry-looking or red, bloody stools  FOLLOW UP:  If any biopsies were taken you will be contacted by phone or by letter within the next 1-3 weeks. Call [phone]  if you have not heard about the biopsies in 3 weeks.  Please also call with any specific questions about appointments or follow up tests. 

## 2022-05-09 ENCOUNTER — Other Ambulatory Visit: Payer: Self-pay | Admitting: Physician Assistant

## 2022-05-09 ENCOUNTER — Other Ambulatory Visit: Payer: Self-pay

## 2022-05-09 DIAGNOSIS — K222 Esophageal obstruction: Secondary | ICD-10-CM

## 2022-05-09 DIAGNOSIS — R1310 Dysphagia, unspecified: Secondary | ICD-10-CM

## 2022-05-09 LAB — SURGICAL PATHOLOGY

## 2022-05-09 MED ORDER — FLUCONAZOLE 200 MG PO TABS: ORAL_TABLET | ORAL | 0 refills | Status: AC

## 2022-05-09 NOTE — Telephone Encounter (Signed)
Called and spoke with patient regarding pathology results and recommendations. Pt has been advised that candidiasis is still present and she needs recurrent treatment. Pt is aware that Fluconazole RX has been sent to pharmacy on file. Repeat EGD with dilation is scheduled at [facility] on 06/23/22 at 8:30 am with Dr. Barron Alvine. Pt is aware that I will mail her instructions. Pt has been advised if candidiasis continues to be present we will need to refer her to ID for alternative treatment and if stricture continues to be present she may need a Kenalog injection during the EGD. Pt verbalized understanding of all information and had no concerns at the end of the call.  Fluconazole RX sent to pharmacy on file. EGD instructions mailed to patient. Ambulatory referral to GI in epic.

## 2022-05-09 NOTE — Telephone Encounter (Signed)
Esophageal biopsies from the upper endoscopy this week again show Candida esophagitis.  Plan to treat as follows:   -Fluconazole 400 mg on day 1, then 200 mg daily x 20 days.  Prescribe 200 mg tablets, #22, RF 0 -Plan for repeat upper endoscopy in about 6 weeks for continued serial esophageal dilations and evaluation for resolution of esophageal candidiasis - If continued esophageal candidiasis on repeat upper endoscopy, will refer to Infectious Disease to discuss alternate treatment options -If continued persistent stricture on repeat upper endoscopy, may also elect for Kenalog injection.  This will be decided at the time of endoscopy

## 2022-05-12 ENCOUNTER — Encounter (HOSPITAL_COMMUNITY): Payer: Self-pay | Admitting: Gastroenterology

## 2022-05-15 ENCOUNTER — Ambulatory Visit (INDEPENDENT_AMBULATORY_CARE_PROVIDER_SITE_OTHER): Payer: Medicare Other | Admitting: Pulmonary Disease

## 2022-05-15 ENCOUNTER — Encounter: Payer: Self-pay | Admitting: Pulmonary Disease

## 2022-05-15 VITALS — BP 122/70 | HR 52 | Ht 64.0 in | Wt 80.4 lb

## 2022-05-15 DIAGNOSIS — J302 Other seasonal allergic rhinitis: Secondary | ICD-10-CM | POA: Diagnosis not present

## 2022-05-15 DIAGNOSIS — J449 Chronic obstructive pulmonary disease, unspecified: Secondary | ICD-10-CM | POA: Diagnosis not present

## 2022-05-15 DIAGNOSIS — J9611 Chronic respiratory failure with hypoxia: Secondary | ICD-10-CM

## 2022-05-15 NOTE — Patient Instructions (Addendum)
Continue Trelegy ellipta 1 puff daily - rinse mouth out well after each use  We will consider transitioning you to Anoro Ellipta 1 puff daily if you continue to have issues with Candidiasis of your esophagus.   Continue to use 2L of oxygen day/night  Follow up in 3 months
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
## 2022-05-29 ENCOUNTER — Telehealth: Payer: Self-pay | Admitting: Pulmonary Disease

## 2022-05-29 DIAGNOSIS — J441 Chronic obstructive pulmonary disease with (acute) exacerbation: Secondary | ICD-10-CM

## 2022-05-29 MED ORDER — PREDNISONE 10 MG PO TABS
ORAL_TABLET | ORAL | 0 refills | Status: AC
Start: 2022-05-29 — End: 2022-06-09

## 2022-05-29 MED ORDER — AZITHROMYCIN 250 MG PO TABS
ORAL_TABLET | ORAL | 0 refills | Status: DC
Start: 2022-05-29 — End: 2022-10-23

## 2022-05-29 NOTE — Telephone Encounter (Signed)
Spoke with patient. She complains of itchy/watery eyes, sob, wheezing, slight productive cough with greenish phlegm.  Symptoms present x2 weeks. Patient advises symptoms seem worse Patient is requesting prednisone prescription   Pharmacy [facility] [address]  Dr. Francine Graven please advise?

## 2022-05-29 NOTE — Telephone Encounter (Signed)
Will treat for COPD exacerbation with prednisone taper and Zpak. Medications sent to pharmacy.  JD

## 2022-05-29 NOTE — Telephone Encounter (Signed)
Spoke with patient. Advised antibiotic and prednisone have been sent to pharmacy. NFN

## 2022-05-29 NOTE — Telephone Encounter (Signed)
Patient would like some medication for her allergies.  She stated that she would like the Prednisone to help her breathing.  If there are any questions, please call to discuss at [phone]

## 2022-06-04 DIAGNOSIS — E039 Hypothyroidism, unspecified: Secondary | ICD-10-CM | POA: Diagnosis not present

## 2022-06-04 DIAGNOSIS — Z1331 Encounter for screening for depression: Secondary | ICD-10-CM | POA: Diagnosis not present

## 2022-06-04 DIAGNOSIS — E7800 Pure hypercholesterolemia, unspecified: Secondary | ICD-10-CM | POA: Diagnosis not present

## 2022-06-04 DIAGNOSIS — I2510 Atherosclerotic heart disease of native coronary artery without angina pectoris: Secondary | ICD-10-CM | POA: Diagnosis not present

## 2022-06-04 DIAGNOSIS — E559 Vitamin D deficiency, unspecified: Secondary | ICD-10-CM | POA: Diagnosis not present

## 2022-06-04 DIAGNOSIS — M810 Age-related osteoporosis without current pathological fracture: Secondary | ICD-10-CM | POA: Diagnosis not present

## 2022-06-04 DIAGNOSIS — J438 Other emphysema: Secondary | ICD-10-CM | POA: Diagnosis not present

## 2022-06-04 DIAGNOSIS — Z0000 Encounter for general adult medical examination without abnormal findings: Secondary | ICD-10-CM | POA: Diagnosis not present

## 2022-06-04 DIAGNOSIS — I10 Essential (primary) hypertension: Secondary | ICD-10-CM | POA: Diagnosis not present

## 2022-06-04 DIAGNOSIS — K222 Esophageal obstruction: Secondary | ICD-10-CM | POA: Diagnosis not present

## 2022-06-04 DIAGNOSIS — I5022 Chronic systolic (congestive) heart failure: Secondary | ICD-10-CM | POA: Diagnosis not present

## 2022-06-12 ENCOUNTER — Telehealth: Payer: Self-pay

## 2022-06-12 ENCOUNTER — Encounter (HOSPITAL_COMMUNITY): Payer: Self-pay | Admitting: Gastroenterology

## 2022-06-12 DIAGNOSIS — J449 Chronic obstructive pulmonary disease, unspecified: Secondary | ICD-10-CM

## 2022-06-12 MED ORDER — MONTELUKAST SODIUM 10 MG PO TABS
10.0000 mg | ORAL_TABLET | Freq: Every day | ORAL | 11 refills | Status: DC
Start: 2022-06-12 — End: 2023-08-26

## 2022-06-12 NOTE — Progress Notes (Signed)
[name]  Prep instructions- n/a   Glorianne Manchester MD Cardiologist- Olga Millers MD  EKG-06/19/21 Echo-07/27/21 [id number] Stress-n/a ICD/PM-n/a Blood thinner-Eliquis 2 day hold GLP-1-n/a  Hx: CAD, HTN, MI 09, PVD, AAA, COPD, CHF, cardiomyopathy, Afib, recent dx of panc mass. Last ofv with cardiology was in 08/2021, since then has gotten cards clearance in 02/2022 for an endo procedures in feb and in april. Also sees pulm, last visit 4/17 with a 3 month f/u. Since that visit she did call into pulm on 5/1 with some wheezing and cough. Dr Francine Graven treated her for COPD exac. and gave steroid taper and zpack. Patient reports feeling much better since then. She is on 2L02, which is what she is normally on.  Anesthesia Review: Yes

## 2022-06-12 NOTE — Telephone Encounter (Signed)
Hi [place],  I spoke with patient on phone today. Her symptoms have resolved and she feels she is at baseline. I think ok to move forward with EGD as scheduled. I let her know if her breathing changes to call us and the procedure can be post-poned if needed.    ARISCAT Score for Postoperative Pulmonary Complications Patient is intermediate risk to High risk 13.3% to 42.1% risk of in-hospital post-op pulmonary complications (composite including respiratory failure, respiratory infection, pleural effusion, atelectasis, pneumothorax, bronchospasm, aspiration pneumonitis)  Thanks, Dr. Francine Graven

## 2022-06-12 NOTE — Telephone Encounter (Signed)
   Tonya Harper 04-Dec-1945 [id number]  Dear Dr. Francine Graven:  We have scheduled the above named patient for an EGD with balloon dilation. Our records show that she requires pulmonary clearance after recent COPD exacerbation.  Please advise as to whether the patient may proceed with their procedure which is scheduled for 06/20/22.  Please route your response to Juanda Crumble, RN or fax response to [phone].  Sincerely,    [facility]

## 2022-06-20 ENCOUNTER — Encounter (HOSPITAL_COMMUNITY)
Admission: RE | Disposition: A | Discharge status: Home / Self Care | Payer: Self-pay | Source: Home / Self Care | Attending: Gastroenterology

## 2022-06-20 ENCOUNTER — Ambulatory Visit (HOSPITAL_COMMUNITY): Payer: Medicare Other | Admitting: Anesthesiology

## 2022-06-20 ENCOUNTER — Other Ambulatory Visit: Payer: Self-pay

## 2022-06-20 ENCOUNTER — Encounter (HOSPITAL_COMMUNITY): Payer: Self-pay | Admitting: Gastroenterology

## 2022-06-20 ENCOUNTER — Ambulatory Visit (HOSPITAL_COMMUNITY)
Admission: RE | Admit: 2022-06-20 | Discharge: 2022-06-20 | Disposition: A | Discharge status: Home / Self Care | Payer: Medicare Other | Attending: Gastroenterology | Admitting: Gastroenterology

## 2022-06-20 ENCOUNTER — Ambulatory Visit (HOSPITAL_BASED_OUTPATIENT_CLINIC_OR_DEPARTMENT_OTHER): Payer: Medicare Other | Admitting: Anesthesiology

## 2022-06-20 DIAGNOSIS — I252 Old myocardial infarction: Secondary | ICD-10-CM | POA: Insufficient documentation

## 2022-06-20 DIAGNOSIS — Z87891 Personal history of nicotine dependence: Secondary | ICD-10-CM

## 2022-06-20 DIAGNOSIS — I739 Peripheral vascular disease, unspecified: Secondary | ICD-10-CM | POA: Insufficient documentation

## 2022-06-20 DIAGNOSIS — I509 Heart failure, unspecified: Secondary | ICD-10-CM | POA: Diagnosis not present

## 2022-06-20 DIAGNOSIS — Z79899 Other long term (current) drug therapy: Secondary | ICD-10-CM | POA: Insufficient documentation

## 2022-06-20 DIAGNOSIS — Z9981 Dependence on supplemental oxygen: Secondary | ICD-10-CM | POA: Diagnosis not present

## 2022-06-20 DIAGNOSIS — Z7901 Long term (current) use of anticoagulants: Secondary | ICD-10-CM | POA: Diagnosis not present

## 2022-06-20 DIAGNOSIS — Q396 Congenital diverticulum of esophagus: Secondary | ICD-10-CM | POA: Diagnosis not present

## 2022-06-20 DIAGNOSIS — I25119 Atherosclerotic heart disease of native coronary artery with unspecified angina pectoris: Secondary | ICD-10-CM | POA: Insufficient documentation

## 2022-06-20 DIAGNOSIS — E039 Hypothyroidism, unspecified: Secondary | ICD-10-CM | POA: Diagnosis not present

## 2022-06-20 DIAGNOSIS — D759 Disease of blood and blood-forming organs, unspecified: Secondary | ICD-10-CM | POA: Insufficient documentation

## 2022-06-20 DIAGNOSIS — K222 Esophageal obstruction: Secondary | ICD-10-CM | POA: Diagnosis not present

## 2022-06-20 DIAGNOSIS — J449 Chronic obstructive pulmonary disease, unspecified: Secondary | ICD-10-CM | POA: Diagnosis not present

## 2022-06-20 DIAGNOSIS — I350 Nonrheumatic aortic (valve) stenosis: Secondary | ICD-10-CM | POA: Insufficient documentation

## 2022-06-20 DIAGNOSIS — K449 Diaphragmatic hernia without obstruction or gangrene: Secondary | ICD-10-CM | POA: Diagnosis not present

## 2022-06-20 DIAGNOSIS — I5022 Chronic systolic (congestive) heart failure: Secondary | ICD-10-CM | POA: Insufficient documentation

## 2022-06-20 DIAGNOSIS — I2510 Atherosclerotic heart disease of native coronary artery without angina pectoris: Secondary | ICD-10-CM | POA: Diagnosis not present

## 2022-06-20 DIAGNOSIS — R1310 Dysphagia, unspecified: Secondary | ICD-10-CM | POA: Insufficient documentation

## 2022-06-20 DIAGNOSIS — I110 Hypertensive heart disease with heart failure: Secondary | ICD-10-CM | POA: Diagnosis not present

## 2022-06-20 HISTORY — PX: BALLOON DILATION: SHX5330

## 2022-06-20 HISTORY — PX: SUBMUCOSAL INJECTION: SHX5543

## 2022-06-20 HISTORY — PX: ESOPHAGOGASTRODUODENOSCOPY (EGD) WITH PROPOFOL: SHX5813

## 2022-06-20 SURGERY — ESOPHAGOGASTRODUODENOSCOPY (EGD) WITH PROPOFOL
Anesthesia: Monitor Anesthesia Care

## 2022-06-20 MED ORDER — LIDOCAINE 2% (20 MG/ML) 5 ML SYRINGE
INTRAMUSCULAR | Status: DC | PRN
  Administered 2022-06-20: 50 mg via INTRAVENOUS

## 2022-06-20 MED ORDER — SODIUM CHLORIDE 0.9 % IV SOLN: INTRAVENOUS | Status: DC

## 2022-06-20 MED ORDER — LACTATED RINGERS IV SOLN: INTRAVENOUS | Status: DC

## 2022-06-20 MED ORDER — PROPOFOL 10 MG/ML IV BOLUS
INTRAVENOUS | Status: DC | PRN
  Administered 2022-06-20 (×3): 10 mg via INTRAVENOUS
  Administered 2022-06-20: 20 mg via INTRAVENOUS
  Administered 2022-06-20 (×2): 10 mg via INTRAVENOUS

## 2022-06-20 MED ORDER — TRIAMCINOLONE ACETONIDE 40 MG/ML IJ SUSP
INTRAMUSCULAR | Status: DC | PRN
  Administered 2022-06-20: 40 mg via INTRAMUSCULAR

## 2022-06-20 MED ORDER — PROPOFOL 500 MG/50ML IV EMUL
INTRAVENOUS | Status: AC
  Filled 2022-06-20: qty 50

## 2022-06-20 MED ORDER — PROPOFOL 10 MG/ML IV BOLUS
INTRAVENOUS | Status: AC
  Filled 2022-06-20: qty 20

## 2022-06-20 MED ORDER — TRIAMCINOLONE ACETONIDE 40 MG/ML IJ SUSP
INTRAMUSCULAR | Status: AC
  Filled 2022-06-20: qty 1

## 2022-06-20 SURGICAL SUPPLY — 15 items

## 2022-06-20 NOTE — Op Note (Signed)
[facility] Patient Name: Tonya Harper Procedure Date: 06/20/2022 MRN: [id number] Attending MD: Doristine Locks , MD, [id number] Date of Birth: 07/12/1945 CSN: [id number] Age: 77 Admit Type: Outpatient Procedure:                Upper GI endoscopy Indications:              Dysphagia, Stricture of the esophagus, For therapy                            of esophageal stricture                           EGD in 03/12/2022 and notable for multiple                            benign-appearing, intrinsic moderate stenoses found                            between 28?"to 40 cm from incisors with scarred                            appearance throughout this area. The narrowest                            stenotic area was located at 35 cm measuring 7 mm                            inner diameter and required ultraslim scope to                            traverse. Esophagus dilated with 8 and 10 mm Savary                            dilator with 4 separate mucosal disruptions and                            improvement in luminal diameter. Also noted                            esophageal candidiasis in the mid and lower                            esophagus with several pseudo diverticula, treated                            with fluconazole. 7 mm polyp at 35 cm consistent                            with known benign squamous papilloma. Was started                            on Protonix 40 mg twice daily as prescribed (was  taking on demand only), fluticasone, with repeat                            upper endoscopy in 6-8 weeks.                           Repeat upper endoscopy in 05/08/2022 was notable for                            multiple stenoses between 28-40 cm with the                            narrowest measuring 7 mm inner diameter and                            esophagus dilated with 10 mm TTS balloon with                            several mucosal  disruptions. Strictures were then                            further refractory to using cold forceps. 7 mm                            squamous papilloma at 35 cm, and recurrence of                            esophageal candidiasis with several small pseudo                            diverticula in the middle and lower third of the                            esophagus. Otherwise normal stomach and duodenum.                            Was again treated with fluconazole for 100 mg x 1                            then 200 mg daily x 20 days and presents today for                            repeat upper endoscopy.                           Reports overall improvement since last EGD with                            dilation. Providers:                Doristine Locks, MD Referring MD:              Medicines:                Monitored Anesthesia  Care Complications:            No immediate complications. Estimated Blood Loss:     Estimated blood loss was minimal. Procedure:                Pre-Anesthesia Assessment:                           - Prior to the procedure, a History and Physical                            was performed, and patient medications and                            allergies were reviewed. The patient's tolerance of                            previous anesthesia was also reviewed. The risks                            and benefits of the procedure and the sedation                            options and risks were discussed with the patient.                            All questions were answered, and informed consent                            was obtained. Prior Anticoagulants: The patient has                            taken Eliquis (apixaban), last dose was 2 days                            prior to procedure. ASA Grade Assessment: IV - A                            patient with severe systemic disease that is a                            constant threat to life. After reviewing  the risks                            and benefits, the patient was deemed in                            satisfactory condition to undergo the procedure.                           After obtaining informed consent, the endoscope was                            passed under direct vision. Throughout the  procedure, the patient's blood pressure, pulse, and                            oxygen saturations were monitored continuously. The                            [id number]-H190 ([id number]) Olympus endoscope was introduced                            through the mouth, and advanced to the second part                            of duodenum. The upper GI endoscopy was                            accomplished without difficulty. The patient                            tolerated the procedure well. Scope In: Scope Out: Findings:      Multiple benign-appearing, intrinsic moderate stenoses were found in the       lower third of the esophagus. There was a dominant stricture in the       lower esophagus at the GEJ which was only traversed after dilation. A       TTS dilator was passed through the scope. Dilation with a 11-08-10 mm       balloon dilator was performed to 11 mm. The dilation site was examined       and showed 2 mild mucosal disruptions and moderate improvement in       luminal narrowing. The balloon was reinflated and dragged proximally       through the esophagus for discovery and dilation of any additional       strictures. Estimated blood loss was minimal. The stricture in the lower       esophagus was then successfully injected with 40 mg triamcinolone in 4       quadrants.      Several small non-bleeding pseudodiverticula were found in the middle       third of the esophagus and in the lower third of the esophagus.      The entire examined stomach was normal.      The examined duodenum was normal. Impression:               - Benign-appearing esophageal stenoses. Dilated                             with 11 mm TTS balloon then injected with                            triamcinolone.                           - Diverticulum in the middle third of the esophagus                            and in the lower third of the esophagus.                           -  Normal stomach.                           - Normal examined duodenum.                           - No specimens collected. Moderate Sedation:      Not Applicable - Patient had care per Anesthesia. Recommendation:           - Patient has a contact number available for                            emergencies. The signs and symptoms of potential                            delayed complications were discussed with the                            patient. Return to normal activities tomorrow.                            Written discharge instructions were provided to the                            patient.                           - Soft diet today, then slowly advance as tolerated                            tomorrow.                           - Continue high dose PPI.                           - Continue present medications.                           - Repeat upper endoscopy in 8 weeks for retreatment                            and for surveillance. Procedure Code(s):        --- Professional ---                           [phone], Esophagogastroduodenoscopy, flexible,                            transoral; with transendoscopic balloon dilation of                            esophagus (less than 30 mm diameter)                           [id number], 59, Esophagogastroduodenoscopy, flexible,  transoral; with directed submucosal injection(s),                            any substance Diagnosis Code(s):        --- Professional ---                           K22.2, Esophageal obstruction                           Q39.6, Congenital diverticulum of esophagus                           R13.10, Dysphagia,  unspecified CPT copyright 2022 [employer] Association. All rights reserved. The codes documented in this report are preliminary and upon coder review may  be revised to meet current compliance requirements. Doristine Locks, MD 06/20/2022 8:38:52 AM Number of Addenda: 0

## 2022-06-20 NOTE — Anesthesia Procedure Notes (Signed)
Procedure Name: MAC Date/Time: 06/20/2022 8:10 AM  Performed by: Krissia Schreier D, CRNAPre-anesthesia Checklist: Patient identified, Emergency Drugs available, Suction available and Patient being monitored Patient Re-evaluated:Patient Re-evaluated prior to induction Oxygen Delivery Method: Simple face mask

## 2022-06-20 NOTE — Anesthesia Preprocedure Evaluation (Addendum)
Anesthesia Evaluation  Patient identified by MRN, date of birth, ID band Patient awake    Reviewed: Allergy & Precautions, NPO status , Patient's Chart, lab work & pertinent test results  Airway Mallampati: II  TM Distance: >3 FB Neck ROM: Full    Dental  (+) Upper Dentures, Edentulous Lower   Pulmonary shortness of breath and with exertion, COPD,  COPD inhaler and oxygen dependent, former smoker 2L [place]   Pulmonary exam normal        Cardiovascular hypertension, Pt. on home beta blockers + angina (Nitro patch and SL prn)  + CAD, + Past MI, + Peripheral Vascular Disease and +CHF  Normal cardiovascular exam+ dysrhythmias + Valvular Problems/Murmurs AS   ECHO: 1. Left ventricular ejection fraction, by estimation, is 50 to 55%. The  left ventricle has low normal function. The left ventricle demonstrates  regional wall motion abnormalities (see scoring diagram/findings for  description). Left ventricular diastolic   parameters are consistent with Grade I diastolic dysfunction (impaired  relaxation). There is mild hypokinesis of the left ventricular, basal  inferior wall.  2. Right ventricular systolic function is normal. The right ventricular  size is normal. There is mildly elevated pulmonary artery systolic  pressure.  3. Left atrial size was moderately dilated.  4. The mitral valve is degenerative. Mild to moderate mitral valve  regurgitation. No evidence of mitral stenosis.   5. Tricuspid valve regurgitation is mild to moderate.   6. The aortic valve is calcified. There is mild calcification of the  aortic valve. There is mild thickening of the aortic valve. Aortic valve  regurgitation is mild. Mild aortic valve stenosis. Aortic valve mean  gradient measures 7.0 mmHg.   7. The inferior vena cava is normal in size with greater than 50%  respiratory variability, suggesting right atrial pressure of 3 mmHg.      Neuro/Psych negative neurological ROS  negative psych ROS   GI/Hepatic Neg liver ROS, hiatal hernia,,,  Endo/Other  Hypothyroidism    Renal/GU Renal disease     Musculoskeletal negative musculoskeletal ROS (+)    Abdominal   Peds  Hematology  (+) Blood dyscrasia (Eliquis)   Anesthesia Other Findings Dysphagia esophageal stricture esophageal candidiasis  Reproductive/Obstetrics                             Anesthesia Physical Anesthesia Plan  ASA: 4  Anesthesia Plan: MAC   Post-op Pain Management:    Induction: Intravenous  PONV Risk Score and Plan: 2 and Propofol infusion and Treatment may vary due to age or medical condition  Airway Management Planned: Nasal Cannula  Additional Equipment:   Intra-op Plan:   Post-operative Plan:   Informed Consent: I have reviewed the patients History and Physical, chart, labs and discussed the procedure including the risks, benefits and alternatives for the proposed anesthesia with the patient or authorized representative who has indicated his/her understanding and acceptance.     Dental advisory given  Plan Discussed with: CRNA  Anesthesia Plan Comments:         Anesthesia Quick Evaluation
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
## 2022-06-20 NOTE — Discharge Instructions (Signed)
YOU HAD AN ENDOSCOPIC PROCEDURE TODAY: Refer to the procedure report and other information in the discharge instructions given to you for any specific questions about what was found during the examination. If this information does not answer your questions, please call [place] office at [phone] to clarify.  ° °YOU SHOULD EXPECT: Some feelings of bloating in the abdomen. Passage of more gas than usual. Walking can help get rid of the air that was put into your GI tract during the procedure and reduce the bloating. If you had a lower endoscopy (such as a colonoscopy or flexible sigmoidoscopy) you may notice spotting of blood in your stool or on the toilet paper. Some abdominal soreness may be present for a day or two, also. ° °DIET: Your first meal following the procedure should be a light meal and then it is ok to progress to your normal diet. A half-sandwich or bowl of soup is an example of a good first meal. Heavy or fried foods are harder to digest and may make you feel nauseous or bloated. Drink plenty of fluids but you should avoid alcoholic beverages for 24 hours. If you had a esophageal dilation, please see attached instructions for diet.   ° °ACTIVITY: Your care partner should take you home directly after the procedure. You should plan to take it easy, moving slowly for the rest of the day. You can resume normal activity the day after the procedure however YOU SHOULD NOT DRIVE, use power tools, machinery or perform tasks that involve climbing or major physical exertion for 24 hours (because of the sedation medicines used during the test).  ° °SYMPTOMS TO REPORT IMMEDIATELY: °A gastroenterologist can be reached at any hour. Please call [phone]  for any of the following symptoms:  °Following lower endoscopy (colonoscopy, flexible sigmoidoscopy) °Excessive amounts of blood in the stool  °Significant tenderness, worsening of abdominal pains  °Swelling of the abdomen that is new, acute  °Fever of 100° or  higher  °Following upper endoscopy (EGD, EUS, ERCP, esophageal dilation) °Vomiting of blood or coffee ground material  °New, significant abdominal pain  °New, significant chest pain or pain under the shoulder blades  °Painful or persistently difficult swallowing  °New shortness of breath  °Black, tarry-looking or red, bloody stools ° °FOLLOW UP:  °If any biopsies were taken you will be contacted by phone or by letter within the next 1-3 weeks. Call [phone]  if you have not heard about the biopsies in 3 weeks.  °Please also call with any specific questions about appointments or follow up tests. ° °

## 2022-06-20 NOTE — Transfer of Care (Signed)
Immediate Anesthesia Transfer of Care Note  Patient: Tonya Harper  Procedure(s) Performed: ESOPHAGOGASTRODUODENOSCOPY (EGD) WITH PROPOFOL BALLOON DILATION SUBMUCOSAL INJECTION  Patient Location: PACU  Anesthesia Type:MAC  Level of Consciousness: awake, alert , and oriented  Airway & Oxygen Therapy: Patient Spontanous Breathing and Patient connected to face mask oxygen  Post-op Assessment: Report given to RN and Post -op Vital signs reviewed and stable  Post vital signs: Reviewed and stable  Last Vitals:  Vitals Value Taken Time  BP    Temp    Pulse 64 06/20/22 0831  Resp 23 06/20/22 0831  SpO2 100 % 06/20/22 0831  Vitals shown include unvalidated device data.  Last Pain:  Vitals:   06/20/22 0745  TempSrc: Temporal  PainSc: 0-No pain         Complications: No notable events documented.

## 2022-06-20 NOTE — Anesthesia Postprocedure Evaluation (Signed)
Anesthesia Post Note  Patient: Tonya Harper  Procedure(s) Performed: ESOPHAGOGASTRODUODENOSCOPY (EGD) WITH PROPOFOL BALLOON DILATION SUBMUCOSAL INJECTION     Patient location during evaluation: Endoscopy Anesthesia Type: MAC Level of consciousness: awake Pain management: pain level controlled Vital Signs Assessment: post-procedure vital signs reviewed and stable Respiratory status: spontaneous breathing, nonlabored ventilation and respiratory function stable Cardiovascular status: blood pressure returned to baseline and stable Postop Assessment: no apparent nausea or vomiting Anesthetic complications: no   No notable events documented.  Last Vitals:  Vitals:   06/20/22 0845 06/20/22 0850  BP:  137/70  Pulse: (!) 58 (!) 59  Resp: 18 16  Temp:    SpO2: 100% 100%    Last Pain:  Vitals:   06/20/22 0850  TempSrc:   PainSc: 0-No pain                 Ninah Moccio P Tameya Kuznia

## 2022-06-20 NOTE — Interval H&P Note (Signed)
History and Physical Interval Note:  06/20/2022 7:57 AM  Tonya Harper  has presented today for surgery, with the diagnosis of dysphagia/esophageal stricture/esophageal candidiasis.  The various methods of treatment have been discussed with the patient and family. After consideration of risks, benefits and other options for treatment, the patient has consented to  Procedure(s): ESOPHAGOGASTRODUODENOSCOPY (EGD) WITH PROPOFOL (N/A) BALLOON DILATION (N/A) and possible Kenalog injection as a surgical intervention.  The patient's history has been reviewed, patient examined, no change in status, stable for surgery.  I have reviewed the patient's chart and labs.  Questions were answered to the patient's satisfaction.     Verlin Dike Lily Kernen

## 2022-06-24 ENCOUNTER — Encounter (HOSPITAL_COMMUNITY): Payer: Self-pay | Admitting: Gastroenterology

## 2022-07-05 ENCOUNTER — Other Ambulatory Visit: Payer: Self-pay | Admitting: Student

## 2022-07-05 DIAGNOSIS — I480 Paroxysmal atrial fibrillation: Secondary | ICD-10-CM

## 2022-07-05 DIAGNOSIS — I6523 Occlusion and stenosis of bilateral carotid arteries: Secondary | ICD-10-CM

## 2022-07-05 DIAGNOSIS — I340 Nonrheumatic mitral (valve) insufficiency: Secondary | ICD-10-CM

## 2022-07-05 DIAGNOSIS — I071 Rheumatic tricuspid insufficiency: Secondary | ICD-10-CM

## 2022-07-05 DIAGNOSIS — I255 Ischemic cardiomyopathy: Secondary | ICD-10-CM

## 2022-07-05 DIAGNOSIS — I2510 Atherosclerotic heart disease of native coronary artery without angina pectoris: Secondary | ICD-10-CM

## 2022-07-05 DIAGNOSIS — E785 Hyperlipidemia, unspecified: Secondary | ICD-10-CM

## 2022-07-05 DIAGNOSIS — I739 Peripheral vascular disease, unspecified: Secondary | ICD-10-CM

## 2022-07-05 DIAGNOSIS — I351 Nonrheumatic aortic (valve) insufficiency: Secondary | ICD-10-CM

## 2022-07-05 DIAGNOSIS — I5022 Chronic systolic (congestive) heart failure: Secondary | ICD-10-CM

## 2022-07-05 DIAGNOSIS — I10 Essential (primary) hypertension: Secondary | ICD-10-CM

## 2022-07-09 ENCOUNTER — Other Ambulatory Visit: Payer: Self-pay | Admitting: Student

## 2022-07-09 DIAGNOSIS — I10 Essential (primary) hypertension: Secondary | ICD-10-CM

## 2022-07-09 DIAGNOSIS — I739 Peripheral vascular disease, unspecified: Secondary | ICD-10-CM

## 2022-07-09 DIAGNOSIS — I255 Ischemic cardiomyopathy: Secondary | ICD-10-CM

## 2022-07-09 DIAGNOSIS — I5022 Chronic systolic (congestive) heart failure: Secondary | ICD-10-CM

## 2022-07-09 DIAGNOSIS — I2510 Atherosclerotic heart disease of native coronary artery without angina pectoris: Secondary | ICD-10-CM

## 2022-07-09 DIAGNOSIS — I351 Nonrheumatic aortic (valve) insufficiency: Secondary | ICD-10-CM

## 2022-07-09 DIAGNOSIS — I340 Nonrheumatic mitral (valve) insufficiency: Secondary | ICD-10-CM

## 2022-07-09 DIAGNOSIS — I071 Rheumatic tricuspid insufficiency: Secondary | ICD-10-CM

## 2022-07-09 DIAGNOSIS — E785 Hyperlipidemia, unspecified: Secondary | ICD-10-CM

## 2022-07-09 DIAGNOSIS — I480 Paroxysmal atrial fibrillation: Secondary | ICD-10-CM

## 2022-07-09 DIAGNOSIS — I6523 Occlusion and stenosis of bilateral carotid arteries: Secondary | ICD-10-CM

## 2022-07-23 ENCOUNTER — Other Ambulatory Visit: Payer: Self-pay | Admitting: Student

## 2022-07-23 NOTE — Telephone Encounter (Signed)
Prescription refill request for Eliquis received. Indication:afib Last office visit:8/23 Scr:1.4  12/23 Age: 77 Weight:36.5  kg  Prescription refilled

## 2022-07-28 ENCOUNTER — Other Ambulatory Visit: Payer: Self-pay | Admitting: Primary Care

## 2022-07-28 DIAGNOSIS — J449 Chronic obstructive pulmonary disease, unspecified: Secondary | ICD-10-CM

## 2022-07-29 ENCOUNTER — Telehealth: Payer: Self-pay | Admitting: Pulmonary Disease

## 2022-07-29 DIAGNOSIS — J449 Chronic obstructive pulmonary disease, unspecified: Secondary | ICD-10-CM

## 2022-07-29 NOTE — Telephone Encounter (Signed)
Pt. Call needing more refill sent to pharmacy [facility] ELLIPTA 100-62.5-25 MCG/ACT AEPB [[id number]]

## 2022-07-31 MED ORDER — TRELEGY ELLIPTA 100-62.5-25 MCG/ACT IN AEPB
INHALATION_SPRAY | RESPIRATORY_TRACT | 6 refills | Status: DC
Start: 2022-07-31 — End: 2022-10-23

## 2022-07-31 NOTE — Telephone Encounter (Signed)
Spoke with patient regaridng prior message. Advised patient I did sent in a refill for her Trelegy to her pharmacy .  Patient's voice was understanding.Nothing else further needed a this time

## 2022-07-31 NOTE — Telephone Encounter (Signed)
Patient checking on message for refill for Trelegy. Pharmacy is [facility] [place]. Patient phone number is [phone].

## 2022-08-21 ENCOUNTER — Telehealth: Payer: Self-pay | Admitting: Pulmonary Disease

## 2022-08-21 NOTE — Telephone Encounter (Signed)
Patient dropped off handicap placard form. Put in Dr. Francine Graven box. Mail form to patient's home address. Patient phone number [phone].

## 2022-08-22 NOTE — Telephone Encounter (Signed)
Yes, I am ok to sign it.  Thanks, JD

## 2022-08-22 NOTE — Telephone Encounter (Signed)
Received handicap placard application from front desk.   Dr. Francine Graven, please advise if you are ok with signing this. Thanks!

## 2022-08-26 NOTE — Telephone Encounter (Signed)
Called and spoke with patient. She is aware that the placard has been signed. Confirmed that she would like to have the application mailed to her. Advised her that I would place this in the mail today, she verbalized understanding. A copy has been sent to scan.   Nothing further needed at time of call.

## 2022-09-14 ENCOUNTER — Other Ambulatory Visit: Payer: Self-pay | Admitting: Student

## 2022-09-14 DIAGNOSIS — I255 Ischemic cardiomyopathy: Secondary | ICD-10-CM

## 2022-09-14 DIAGNOSIS — I6523 Occlusion and stenosis of bilateral carotid arteries: Secondary | ICD-10-CM

## 2022-09-14 DIAGNOSIS — I351 Nonrheumatic aortic (valve) insufficiency: Secondary | ICD-10-CM

## 2022-09-14 DIAGNOSIS — E785 Hyperlipidemia, unspecified: Secondary | ICD-10-CM

## 2022-09-14 DIAGNOSIS — I2510 Atherosclerotic heart disease of native coronary artery without angina pectoris: Secondary | ICD-10-CM

## 2022-09-14 DIAGNOSIS — I10 Essential (primary) hypertension: Secondary | ICD-10-CM

## 2022-09-14 DIAGNOSIS — I071 Rheumatic tricuspid insufficiency: Secondary | ICD-10-CM

## 2022-09-14 DIAGNOSIS — I739 Peripheral vascular disease, unspecified: Secondary | ICD-10-CM

## 2022-09-14 DIAGNOSIS — I340 Nonrheumatic mitral (valve) insufficiency: Secondary | ICD-10-CM

## 2022-09-14 DIAGNOSIS — I5022 Chronic systolic (congestive) heart failure: Secondary | ICD-10-CM

## 2022-09-14 DIAGNOSIS — I480 Paroxysmal atrial fibrillation: Secondary | ICD-10-CM

## 2022-10-13 ENCOUNTER — Other Ambulatory Visit: Payer: Self-pay | Admitting: Cardiology

## 2022-10-13 DIAGNOSIS — I071 Rheumatic tricuspid insufficiency: Secondary | ICD-10-CM

## 2022-10-13 DIAGNOSIS — I2510 Atherosclerotic heart disease of native coronary artery without angina pectoris: Secondary | ICD-10-CM

## 2022-10-13 DIAGNOSIS — I351 Nonrheumatic aortic (valve) insufficiency: Secondary | ICD-10-CM

## 2022-10-13 DIAGNOSIS — I5022 Chronic systolic (congestive) heart failure: Secondary | ICD-10-CM

## 2022-10-13 DIAGNOSIS — E785 Hyperlipidemia, unspecified: Secondary | ICD-10-CM

## 2022-10-13 DIAGNOSIS — I739 Peripheral vascular disease, unspecified: Secondary | ICD-10-CM

## 2022-10-13 DIAGNOSIS — I480 Paroxysmal atrial fibrillation: Secondary | ICD-10-CM

## 2022-10-13 DIAGNOSIS — I10 Essential (primary) hypertension: Secondary | ICD-10-CM

## 2022-10-13 DIAGNOSIS — I340 Nonrheumatic mitral (valve) insufficiency: Secondary | ICD-10-CM

## 2022-10-13 DIAGNOSIS — I6523 Occlusion and stenosis of bilateral carotid arteries: Secondary | ICD-10-CM

## 2022-10-13 DIAGNOSIS — I255 Ischemic cardiomyopathy: Secondary | ICD-10-CM

## 2022-10-23 ENCOUNTER — Ambulatory Visit: Payer: Medicare Other | Admitting: Pulmonary Disease

## 2022-10-23 ENCOUNTER — Encounter: Payer: Self-pay | Admitting: Pulmonary Disease

## 2022-10-23 VITALS — BP 142/60 | HR 68 | Ht 64.0 in | Wt 82.0 lb

## 2022-10-23 DIAGNOSIS — J449 Chronic obstructive pulmonary disease, unspecified: Secondary | ICD-10-CM

## 2022-10-23 DIAGNOSIS — J9611 Chronic respiratory failure with hypoxia: Secondary | ICD-10-CM | POA: Diagnosis not present

## 2022-10-23 MED ORDER — AZITHROMYCIN 250 MG PO TABS
ORAL_TABLET | ORAL | 0 refills | Status: DC
Start: 2022-10-23 — End: 2022-12-23

## 2022-10-23 MED ORDER — TRELEGY ELLIPTA 200-62.5-25 MCG/ACT IN AEPB
1.0000 | INHALATION_SPRAY | Freq: Every day | RESPIRATORY_TRACT | 11 refills | Status: DC
Start: 2022-10-23 — End: 2023-08-26

## 2022-10-23 MED ORDER — PREDNISONE 10 MG PO TABS
ORAL_TABLET | ORAL | 0 refills | Status: AC
Start: 2022-10-23 — End: 2022-11-03

## 2022-10-23 NOTE — Patient Instructions (Addendum)
Use trelegy 1 puff daily - rinse mouth out after each use  Start prednisone taper: 40mg  daily x 3 days 30mg  daily x 3 days 20mg  daily x 3 days 10mg  daily x 3 days  Use albuterol inhaler or nebulizer treatments as needed  Follow up in 6 months
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
## 2022-11-06 ENCOUNTER — Telehealth: Payer: Self-pay | Admitting: Pulmonary Disease

## 2022-11-06 NOTE — Telephone Encounter (Signed)
PT states Dr. Francine Graven told her if we needed to put her back on Pred "short term" to give him a call. Please call in to [facility] in [facility]  Her # is [phone]

## 2022-11-19 NOTE — Telephone Encounter (Signed)
ATC patient- unable to leave vm due to mailbox being full.   

## 2022-11-19 NOTE — Telephone Encounter (Signed)
PT calling again. Would like this filled if possible and also have an antibx added for her. Last time it worked wonders. Please call and/or send into pharm.

## 2022-11-20 MED ORDER — PREDNISONE 10 MG PO TABS: ORAL_TABLET | ORAL | 0 refills | Status: DC

## 2022-11-20 NOTE — Telephone Encounter (Signed)
Yes I can send in prednisone taper

## 2022-11-20 NOTE — Telephone Encounter (Signed)
I called and spoke with the pt  She states has had some chest tightness and wheezing going on 2 wks now  She has minimal cough with clear sputum  Denies fevers, aches  She is using her trelegy and her albuterol inhaler/nebs  She asks for pred taper  No appts available  Please advise, thanks!  Allergies  Allergen Reactions   Aspirin Swelling    nausea and dizziness after taking for one month at a time   Pneumovax [Pneumococcal Polysaccharide Vaccine] Hives and Swelling   Shellfish Allergy Anaphylaxis and Swelling    Medication withdrawal symptoms     Lipitor [Atorvastatin Calcium] Other (See Comments)    Recurrent myalgia with lipitor   Ibuprofen Nausea And Vomiting    dizziness   Tdap [Tetanus-Diphth-Acell Pertussis] Swelling and Rash

## 2022-11-21 NOTE — Telephone Encounter (Signed)
I called and spoke with the pt and notified of response per [facility]  Nothing further needed

## 2022-12-23 ENCOUNTER — Telehealth: Payer: Self-pay | Admitting: Pulmonary Disease

## 2022-12-23 DIAGNOSIS — J441 Chronic obstructive pulmonary disease with (acute) exacerbation: Secondary | ICD-10-CM

## 2022-12-23 MED ORDER — AZITHROMYCIN 250 MG PO TABS
ORAL_TABLET | ORAL | 0 refills | Status: DC
Start: 2022-12-23 — End: 2023-05-22

## 2022-12-23 MED ORDER — PREDNISONE 10 MG PO TABS
ORAL_TABLET | ORAL | 0 refills | Status: AC
Start: 2022-12-23 — End: 2023-01-03

## 2022-12-23 NOTE — Telephone Encounter (Signed)
Steroids and zpak sent to pharmacy.  JD

## 2022-12-23 NOTE — Telephone Encounter (Signed)
Spoke with patient and she reports wheezing and productive cough (green mucus) since early last week. She denies fever/chills. She has been using alb nebs BID with improvement in symptoms.   Please advise, thanks!

## 2022-12-23 NOTE — Telephone Encounter (Signed)
Wheezing every time she breathes, coughing up mucus and its green

## 2023-01-15 ENCOUNTER — Other Ambulatory Visit (HOSPITAL_BASED_OUTPATIENT_CLINIC_OR_DEPARTMENT_OTHER): Payer: Self-pay

## 2023-01-15 DIAGNOSIS — Z23 Encounter for immunization: Secondary | ICD-10-CM | POA: Diagnosis not present

## 2023-01-15 MED ORDER — INFLUENZA VAC A&B SURF ANT ADJ 0.5 ML IM SUSY
0.5000 mL | PREFILLED_SYRINGE | Freq: Once | INTRAMUSCULAR | 0 refills | Status: AC
  Filled 2023-01-15: qty 0.5, 1d supply, fill #0

## 2023-01-15 MED ORDER — RSVPREF3 VAC RECOMB ADJUVANTED 120 MCG/0.5ML IM SUSR
0.5000 mL | Freq: Once | INTRAMUSCULAR | 0 refills | Status: AC
  Filled 2023-01-15: qty 0.5, 1d supply, fill #0

## 2023-01-19 ENCOUNTER — Other Ambulatory Visit: Payer: Self-pay | Admitting: Cardiology

## 2023-01-20 ENCOUNTER — Other Ambulatory Visit: Payer: Self-pay | Admitting: Cardiology

## 2023-02-06 DIAGNOSIS — R0981 Nasal congestion: Secondary | ICD-10-CM | POA: Diagnosis not present

## 2023-02-06 DIAGNOSIS — H6121 Impacted cerumen, right ear: Secondary | ICD-10-CM | POA: Diagnosis not present

## 2023-02-10 ENCOUNTER — Other Ambulatory Visit: Payer: Self-pay | Admitting: Pulmonary Disease

## 2023-02-10 DIAGNOSIS — J441 Chronic obstructive pulmonary disease with (acute) exacerbation: Secondary | ICD-10-CM

## 2023-02-18 ENCOUNTER — Other Ambulatory Visit: Payer: Self-pay | Admitting: Student

## 2023-02-18 DIAGNOSIS — I480 Paroxysmal atrial fibrillation: Secondary | ICD-10-CM

## 2023-02-18 DIAGNOSIS — Z7901 Long term (current) use of anticoagulants: Secondary | ICD-10-CM

## 2023-02-18 NOTE — Telephone Encounter (Signed)
Prescription refill request for Eliquis received. Indication:afib Last office visit:2/24 QMV:HQION labs Age: 78 Weight:37.2  kg  Prescription refilled

## 2023-02-24 ENCOUNTER — Telehealth: Payer: Self-pay | Admitting: Cardiology

## 2023-02-24 NOTE — Telephone Encounter (Signed)
Pt has an appt with Callie, PA 03/03/23, she asked if there are some samples of Eliquis she can have, she will be out tomorrow.

## 2023-02-24 NOTE — Telephone Encounter (Signed)
Called and spoke to patient. Verified name and DOB. Patient is requesting samples for Eliquis. She is almost out of medication. She went to pick up her prescriptions and was only given a 30 day supply because she needed to come in for an appt. The 30 day was $150 but could get a 90 day supply for $75. Please advise if patient can have samples.

## 2023-02-24 NOTE — Progress Notes (Addendum)
Cardiology Office Note:    Date:  03/03/2023   ID:  Tonya Harper, DOB 21-Sep-1945, MRN [id number]  PCP:  Lorenda Ishihara, MD  Cardiologist:  Olga Millers, MD     Referring MD: Lorenda Ishihara,*   Chief Complaint: routine follow-up of CAD, CHF, and atrial fibrillation/ flutter  History of Present Illness:    Tonya Harper is a 78 y.o. female with a history of CAD with CTO of RCA and LCX with collaterals noted on cardiac catheterization in 10/2018 (treated medically given no PCI targets),  ischemic cardiomyopathy/ chronic CHF with mildly reduced EF of 45-50% on Echo in 10/2018 but improved to 50-55% on Echo in 06/2021, paroxysmal atrial fibrillation/flutter noted on monitor in 05/2018 on Eliquis, mild to moderate MR, PAD with severe bilateral multivessel disease (involving iliac arteries, profunda, and SFAs) noted on peripheral angiogram in 03/2018, mild bilateral carotid disease,  AAA (stable at 3.0 cm on last doppler in 09/17/21), hypertension, hyperlipidemia, COPD on home O2, anemia, and chronic dysphagia secondary to candida esophagitis and esophageal stricture s/p multiple EGD with dilations since 2022 who is followed by Dr. Jens Som and presents today for routine follow-up.   Patient was last seen by Dr. Jens Som in 2021/09/17 at which time she reported dyspnea on exertion secondary to COPD but was overall stable from a cardiac standpoint.  She presents today for follow-up. She initially denied any chest pain. However, then at the end of visit, she reported rare episodes of chest pressure about 1-2 times per years. Last episode occurred in 09-18-22 after her granddaughter died. She describes associated shortness of breath and diaphoresis. Symptoms resolved after she took 2 doses of sublingual Nitroglycerin. She has not had any recurrent chest pain since. She states he breathing has "gotten rougher." She has chronic dyspnea on exertion due to her COP2 and is on 2L of O2 all the time.  However, she states her O2 sat will drop to the 70s with certain activities. She states he breathing as been worse since 11/2022. She came down with URI symptoms around that time and states she was treated with 2 rounds of antibiotics and 2 rounds of steroids. She states the steroids usually help a lot. She is worried about her breathing and wants to make sure her heart is not playing a role as well. She has chronic but stable orthopnea. No PND or edema. Her heart rate increases quickly with activity but suspect this is due to her underlying pulmonary issues. No palpitations outside of this. She does describe some lightheadedness when her O2 sat drops but nothing outside these times. No syncope. She also reports feeling very fatigued since 11/2022. She is wondering if this could be due to her anemia. Recent labs last week showed hemoglobin of 10.0 which is around her baseline. She states she has required iron supplements in the past. Recommended following up with PCP for this.  Of note, her O2 sats was mostly around 88-89% on 2L of O2 via nasal cannula during visit today. It would go down to as low as 86% when talking and would improve to 90% after taking deep breaths.  EKGs/Labs/Other Studies Reviewed:    The following studies were reviewed:  Event Monitor 05/14/2018 to 06/12/2018: Normal sinus rhythm with PACs, PVCs, 4 beats of nonsustained ventricular tachycardia, brief atrial fibrillation versus flutter. _______________   Echocardiogram 11/25/2018: Impressions:  1. Left ventricular ejection fraction, by visual estimation, is 45 to  50%. The left ventricle has mildly decreased function. There is  no left  ventricular hypertrophy.   2. Left ventricular diastolic parameters are consistent with Grade I  diastolic dysfunction (impaired relaxation).   3. Global right ventricle has normal systolic function.The right  ventricular size is normal. No increase in right ventricular wall  thickness.   4.  Left atrial size was moderately dilated.   5. Right atrial size was normal.   6. The mitral valve is normal in structure. Mild to moderate mitral valve  regurgitation. No evidence of mitral stenosis.   7. The tricuspid valve is normal in structure. Tricuspid valve  regurgitation is mild.   8. The aortic valve is normal in structure. Aortic valve regurgitation is  mild. No evidence of aortic valve sclerosis or stenosis.   9. There is Mild calcification of the aortic valve.  10. There is Moderate thickening of the aortic valve.  11. The pulmonic valve was normal in structure. Pulmonic valve  regurgitation is not visualized.  12. Mildly elevated pulmonary artery systolic pressure.  13. The inferior vena cava is normal in size with greater than 50%  respiratory variability, suggesting right atrial pressure of 3 mmHg. _______________   Right/Left Cardiac Catheterization 11/26/2018: Mid LAD lesion is 25% stenosed. Mid Cx lesion is 100% stenosed. Left to left collaterals. Mid RCA lesion is 100% stenosed. Left to right collaterals. Dist LM lesion is 25% stenosed. There is mild left ventricular systolic dysfunction. The left ventricular ejection fraction is 45-50% by visual estimate. LV end diastolic pressure is normal. There is no aortic valve stenosis. Ao sat 99%, PA sat 76%, mean PA pressure 24 mm Hg; mean PCWP 7 mm Hg; CO 5.25 L/min; CI 3.97   No target for PCI.  Continue medical therapy.   Normal right heart pressures.    Diagnostic Dominance: Co-dominant  _______________   AAA Duplex 03/24/2019: Visualization of the Left CIA Proximal artery, Right EIA Distal artery,  Right EIA Mid artery, Right EIA Proximal artery, Left CIA Distal artery,  Left EIA Proximal artery, Left EIA Mid artery and Left EIA Distal artery  was limited.    Atherosclerosis in the aorta and iliac arteries.  Known iliac artery disease.   Unable to duplicate prior aorta diameter.  Technically challenging to  excessive bowel gas and heavy calcification.     The largest aortic measurement is 2.7 cm. _______________  ABI/ TBIs 07/19/2021: Summary: Right: Resting right ankle-brachial index indicates moderate right lower  extremity arterial disease. The right toe-brachial index is abnormal.   Left: Resting left ankle-brachial index indicates moderate left lower  extremity arterial disease. The left toe-brachial index is abnormal.  _______________  Echocardiogram 07/27/2021: Impressions:  1. Left ventricular ejection fraction, by estimation, is 50 to 55%. The  left ventricle has low normal function. The left ventricle demonstrates  regional wall motion abnormalities (see scoring diagram/findings for  description). Left ventricular diastolic   parameters are consistent with Grade I diastolic dysfunction (impaired  relaxation). There is mild hypokinesis of the left ventricular, basal  inferior wall.   2. Right ventricular systolic function is normal. The right ventricular  size is normal. There is mildly elevated pulmonary artery systolic  pressure.   3. Left atrial size was moderately dilated.   4. The mitral valve is degenerative. Mild to moderate mitral valve  regurgitation. No evidence of mitral stenosis.   5. Tricuspid valve regurgitation is mild to moderate.   6. The aortic valve is calcified. There is mild calcification of the  aortic valve. There is mild thickening  of the aortic valve. Aortic valve  regurgitation is mild. Mild aortic valve stenosis. Aortic valve mean  gradient measures 7.0 mmHg.   7. The inferior vena cava is normal in size with greater than 50%  respiratory variability, suggesting right atrial pressure of 3 mmHg.  _______________  Aorta Ultrasound 09/03/2021: Impression: Borderline aneurysmal dilation of the proximal abdominal aorta measuring up to 3.0 cm. Diffuse atherosclerotic plaque. Recommend follow-up every 3 years. This recommendation follows ACR  consensus guidelines: White Paper of the ACR Incidental Findings Committee II on Vascular Findings. J Am Coll Radiol 2013; 10:789-794.   EKG:  EKG ordered today.   EKG Interpretation Date/Time:  Monday March 03 2023 14:16:01 EST Ventricular Rate:  71 PR Interval:  128 QRS Duration:  82 QT Interval:  434 QTC Calculation: 471 R Axis:   78  Text Interpretation: Normal sinus rhythm Left ventricular hypertrophy Undelrying artifact  No acute ST/T wave changes Confirmed by Marjie Skiff [id number]) on 03/03/2023 2:18:42 PM    Recent Labs: 02/25/2023: BUN 17; Creatinine, Ser 1.00; Hemoglobin 10.0; Platelets 185; Potassium 5.6; Sodium 141  Recent Lipid Panel    Component Value Date/Time   CHOL 149 02/17/2020 1432   TRIG 71 02/17/2020 1432   HDL 54 02/17/2020 1432   CHOLHDL 2.8 02/17/2020 1432   CHOLHDL 2.5 11/23/2014 0001   VLDL 14 11/23/2014 0001   LDLCALC 81 02/17/2020 1432    Physical Exam:    Vital Signs: BP 132/70 (BP Location: Left Arm, Patient Position: Sitting, Cuff Size: Normal)   Pulse 71   Ht 5\' 4"  (1.626 m)   Wt 83 lb 9.6 oz (37.9 kg)   BMI 14.35 kg/m     Wt Readings from Last 3 Encounters:  03/03/23 83 lb 9.6 oz (37.9 kg)  10/23/22 82 lb (37.2 kg)  06/20/22 80 lb 7.5 oz (36.5 kg)     General: 78 y.o. thin frail female in no acute distress. HEENT: Normocephalic and atraumatic. Sclera clear.  Neck: Supple. No carotid bruits. No JVD. Heart: RRR. No murmurs, gallops, or rubs.  Lungs: No increased work of breathing. Clear to ausculation bilaterally.  Extremities: No lower extremity edema. Skin: Warm and dry. Neuro: No focal deficits. Psych: Normal affect. Responds appropriately.  Assessment:    1. Chest pain of uncertain etiology   2. Coronary artery disease involving native coronary artery of native heart without angina pectoris   3. SOB (shortness of breath)   4. Ischemic cardiomyopathy   5. Chronic HFmrEF   6. Paroxysmal atrial fibrillation (HCC)    7. Aortic valve insufficiency, etiology of cardiac valve disease unspecified   8. Mitral valve insufficiency, unspecified etiology   9. Tricuspid valve insufficiency, unspecified etiology   10. PAD (peripheral artery disease) (HCC)   11. Bilateral carotid artery stenosis   12. Abdominal aortic aneurysm (AAA) without rupture, unspecified part (HCC)   13. Primary hypertension   14. Hyperlipidemia, unspecified hyperlipidemia type   15. Chronic obstructive pulmonary disease, unspecified COPD type (HCC)   16. Chronic hypoxic respiratory failure (HCC)   17. Other fatigue     Plan:    CAD LHC in 10/2018 showed CTO of RCA and LCX with collaterals. Medical therapy was recommended at that time. Nuclear stress test at outside hospital in 07/2019 showed inferior/inferolateral infarct bu no ischemia.  - She reports rare episodes of chest pressure that occur about 1-2 times per year. Last episode occurred in 2022-10-03 after the death of her granddaughter. She had associated worsening shortness  of breath and diaphoresis with this. Symptoms resolved after 2 doses of sublingual Nitroglycerin. No recurrence since.  - No aspirin due to need for Eliquis. - Continue Coreg 25mg  twice daily. - Continue 24 hour Nitro patch daily. She states she has been on this for a while and it works well for her.  - Intolerant to Lipitor in the past. Continue Zetia 10mg  daily and Nexletol 180mg  daily. - OK to continue medical therapy for now given only rare episodes of chest pain. Will repeat Echo. If Echo shows drop in EF or new wall motion abnormalities, may need to consider repeat LHC.    Ischemic Cardiomyopathy Chronic CHF with Mildly Reduced EF EF mildly reduced at 45-50% on Echo in 10/2018. However, last Echo in 06/2021 showed LVEF of 50-55% with mild hypokinesis of the basal inferior wall and grade 1 diastolic dysfunction, norm RV size and function, mild to moderate MR, mild to moderate TR, and mildly elevated PASP.  - She  has chronic dyspnea on exertion largely due to COPD but states this has worsened over the last few months. Euvolemic on exam. - Continue Coreg 25mg  twice daily. - Will check BMET and BNP.  - Will update Echo.  - Suspect her dyspnea is primarily secondary to COPD not CHF.  However, if BNP is markedly elevated, will start Lasix. Depending on what Echo shows and how her breathing does, she may benefit from a repeat R/LHC.    Paroxysmal Atrial Fibrillation/Flutter Monitor in 05/2018 showed PACs/PVCs, 4 beat run of NSVT, and brief atrial fibrillation vs flutter. - Maintaining sinus rhythm on EKG today.  - Continue Coreg 25mg  twice daily. - Continue Eliquis 5mg  twice daily.  Mild Aortic Insufficiency Mild to Moderate Mitral Regurgitation Mild to Moderate Tricuspid Regurgitation Noted on last Echo in 05/2021. - Can reassess on repeat Echo.   PAD Peripheral angiogram in 03/2018 showed severe bilateral multivessel disease involving involving iliac arteries, profunda, and SFAs. ABIs in 06/2021 were moderately reduced bilaterally (0.72 on the right and 0.61 on the left). Previously on Pletal but this was stopped when she was started on DOAC for atrial fibrillation.  - No claudication.  - Continue Eliquis and hyperlipidemia therapy below. - Recommended repeat imaging. However, patient declined.   Carotid Stenosis Carotid ultrasound in 2018 showed mild stenosis (1-39%) bilaterally. - No aspirin due to need for Eliquis. - Continue hyperlipidemia therapy as below.    AAA CTA in 04/2020 showed focal area of aneurysmal dilation involving the proximal abdominal aorta measuring 3.2cm. Doppler in 08/2021 showed borderline aneurysm dilatation of the proximal abdominal aorta measuring up to 3.0cm.  - Plan is for repeat doppler in 08/2024.    Hypertension BP well controlled. - Continue Coreg as above.   Hyperlipidemia Last lipid panel in 05/2022: Total Cholesterol 142, Triglycerides 74, HDL 58, LDL 69.  -  Intolerant to Lipitor in the past. - Continue Zetia 10mg  daily and Nexletol 180mg  daily.   COPD  Chronic Hypoxic Respiratory Failure She is on 2L of O2 most of the time at home.  - She feels like her breathing has gotten worse over the last 3 months. - Continue home inhalers.  - Recommended following up with Pulmonology.  Fatigue Patient reports feeling very fatigued for the last 3 months. - Will check TSH.  - She is wondering if this could be due to her anemia. Recent labs last week showed hemoglobin of 10.0 which is around her baseline. She states she has required iron supplements in  the past. Recommended following up with PCP on this.  Hyperkalemia Potassium 5.6 on labs last week.  - She is not on any medications that cause hyperkalemia.  - Will repeat BMET today.    Disposition: Follow up in 1-2 months after Echo.   Leanne Lovely, PA-C  03/03/2023 5:47 PM    [facility]

## 2023-02-24 NOTE — Telephone Encounter (Signed)
Called and spoke to patient.Below message relayed. Patient stated she will come tomorrow to have labs drawn.    Tylene Fantasia, [facility]   Need updated BMP to decided current dose is appropriate or not  - lab has been ordered

## 2023-02-25 DIAGNOSIS — Z7901 Long term (current) use of anticoagulants: Secondary | ICD-10-CM | POA: Diagnosis not present

## 2023-02-25 DIAGNOSIS — I480 Paroxysmal atrial fibrillation: Secondary | ICD-10-CM | POA: Diagnosis not present

## 2023-02-26 ENCOUNTER — Other Ambulatory Visit: Payer: Self-pay | Admitting: *Deleted

## 2023-02-26 LAB — CBC WITH DIFFERENTIAL/PLATELET
Basophils Absolute: 0 10*3/uL (ref 0.0–0.2)
Basos: 0 %
EOS (ABSOLUTE): 0 10*3/uL (ref 0.0–0.4)
Eos: 0 %
Hematocrit: 31.4 % — ABNORMAL LOW (ref 34.0–46.6)
Hemoglobin: 10 g/dL — ABNORMAL LOW (ref 11.1–15.9)
Immature Grans (Abs): 0 10*3/uL (ref 0.0–0.1)
Immature Granulocytes: 0 %
Lymphocytes Absolute: 1.4 10*3/uL (ref 0.7–3.1)
Lymphs: 28 %
MCH: 29.1 pg (ref 26.6–33.0)
MCHC: 31.8 g/dL (ref 31.5–35.7)
MCV: 91 fL (ref 79–97)
Monocytes Absolute: 0.2 10*3/uL (ref 0.1–0.9)
Monocytes: 3 %
Neutrophils Absolute: 3.3 10*3/uL (ref 1.4–7.0)
Neutrophils: 69 %
Platelets: 185 10*3/uL (ref 150–450)
RBC: 3.44 x10E6/uL — ABNORMAL LOW (ref 3.77–5.28)
RDW: 11.4 % — ABNORMAL LOW (ref 11.7–15.4)
WBC: 4.8 10*3/uL (ref 3.4–10.8)

## 2023-02-26 LAB — BASIC METABOLIC PANEL
BUN/Creatinine Ratio: 17 (ref 12–28)
BUN: 17 mg/dL (ref 8–27)
CO2: 28 mmol/L (ref 20–29)
Calcium: 9.5 mg/dL (ref 8.7–10.3)
Chloride: 99 mmol/L (ref 96–106)
Creatinine, Ser: 1 mg/dL (ref 0.57–1.00)
Glucose: 86 mg/dL (ref 70–99)
Potassium: 5.6 mmol/L — ABNORMAL HIGH (ref 3.5–5.2)
Sodium: 141 mmol/L (ref 134–144)
eGFR: 58 mL/min/{1.73_m2} — ABNORMAL LOW (ref >59)

## 2023-02-26 MED ORDER — APIXABAN 5 MG PO TABS: 5.0000 mg | ORAL_TABLET | Freq: Two times a day (BID) | ORAL | 3 refills | Status: DC

## 2023-03-03 ENCOUNTER — Encounter: Payer: Self-pay | Admitting: Student

## 2023-03-03 ENCOUNTER — Ambulatory Visit: Payer: Medicare Other | Attending: Student | Admitting: Student

## 2023-03-03 VITALS — BP 132/70 | HR 71 | Ht 64.0 in | Wt 83.6 lb

## 2023-03-03 DIAGNOSIS — I10 Essential (primary) hypertension: Secondary | ICD-10-CM | POA: Diagnosis not present

## 2023-03-03 DIAGNOSIS — I071 Rheumatic tricuspid insufficiency: Secondary | ICD-10-CM | POA: Insufficient documentation

## 2023-03-03 DIAGNOSIS — E785 Hyperlipidemia, unspecified: Secondary | ICD-10-CM | POA: Diagnosis not present

## 2023-03-03 DIAGNOSIS — I5022 Chronic systolic (congestive) heart failure: Secondary | ICD-10-CM | POA: Insufficient documentation

## 2023-03-03 DIAGNOSIS — I422 Other hypertrophic cardiomyopathy: Secondary | ICD-10-CM

## 2023-03-03 DIAGNOSIS — I480 Paroxysmal atrial fibrillation: Secondary | ICD-10-CM | POA: Diagnosis not present

## 2023-03-03 DIAGNOSIS — R5383 Other fatigue: Secondary | ICD-10-CM | POA: Insufficient documentation

## 2023-03-03 DIAGNOSIS — J9611 Chronic respiratory failure with hypoxia: Secondary | ICD-10-CM | POA: Insufficient documentation

## 2023-03-03 DIAGNOSIS — E875 Hyperkalemia: Secondary | ICD-10-CM | POA: Insufficient documentation

## 2023-03-03 DIAGNOSIS — R079 Chest pain, unspecified: Secondary | ICD-10-CM | POA: Insufficient documentation

## 2023-03-03 DIAGNOSIS — I2510 Atherosclerotic heart disease of native coronary artery without angina pectoris: Secondary | ICD-10-CM | POA: Insufficient documentation

## 2023-03-03 DIAGNOSIS — I351 Nonrheumatic aortic (valve) insufficiency: Secondary | ICD-10-CM | POA: Insufficient documentation

## 2023-03-03 DIAGNOSIS — I739 Peripheral vascular disease, unspecified: Secondary | ICD-10-CM | POA: Diagnosis not present

## 2023-03-03 DIAGNOSIS — I340 Nonrheumatic mitral (valve) insufficiency: Secondary | ICD-10-CM | POA: Diagnosis not present

## 2023-03-03 DIAGNOSIS — R0602 Shortness of breath: Secondary | ICD-10-CM | POA: Insufficient documentation

## 2023-03-03 DIAGNOSIS — I714 Abdominal aortic aneurysm, without rupture, unspecified: Secondary | ICD-10-CM | POA: Diagnosis not present

## 2023-03-03 DIAGNOSIS — I6523 Occlusion and stenosis of bilateral carotid arteries: Secondary | ICD-10-CM | POA: Insufficient documentation

## 2023-03-03 DIAGNOSIS — J449 Chronic obstructive pulmonary disease, unspecified: Secondary | ICD-10-CM | POA: Diagnosis not present

## 2023-03-03 DIAGNOSIS — I255 Ischemic cardiomyopathy: Secondary | ICD-10-CM | POA: Insufficient documentation

## 2023-03-03 MED ORDER — EZETIMIBE 10 MG PO TABS: 10.0000 mg | ORAL_TABLET | Freq: Every day | ORAL | 3 refills | Status: DC

## 2023-03-03 MED ORDER — NEXLETOL 180 MG PO TABS: 1.0000 | ORAL_TABLET | Freq: Every day | ORAL | 3 refills | Status: AC

## 2023-03-03 MED ORDER — CARVEDILOL 25 MG PO TABS: 25.0000 mg | ORAL_TABLET | Freq: Every day | ORAL | 3 refills | Status: DC

## 2023-03-03 MED ORDER — NITROGLYCERIN 0.2 MG/HR TD PT24: 0.2000 mg | MEDICATED_PATCH | Freq: Every day | TRANSDERMAL | 6 refills | Status: DC

## 2023-03-03 MED ORDER — NITROGLYCERIN 0.4 MG SL SUBL: SUBLINGUAL_TABLET | SUBLINGUAL | 1 refills | Status: AC

## 2023-03-03 NOTE — Patient Instructions (Signed)
Medication Instructions:  The current medical regimen is effective;  continue present plan and medications as directed. Please refer to the Current Medication list given to you today.  *If you need a refill on your cardiac medications before your next appointment, please call your pharmacy*  Lab Work: TSH, BNP, BMET TODAY If you have labs (blood work) drawn today and your tests are completely normal, you will receive your results only by:  MyChart Message (if you have MyChart) OR  A paper copy in the mail If you have any lab test that is abnormal or we need to change your treatment, we will call you to review the results.  Testing/Procedures: Your physician has requested that you have an echocardiogram. Echocardiography is a painless test that uses sound waves to create images of your heart. It provides your doctor with information about the size and shape of your heart and how well your heart's chambers and valves are working. This procedure takes approximately one hour. There are no restrictions for this procedure. Please do NOT wear cologne, perfume, aftershave, or lotions (deodorant is allowed). Please arrive 15 minutes prior to your appointment time.  Please note: We ask at that you not bring children with you during ultrasound (echo/ vascular) testing. Due to room size and safety concerns, children are not allowed in the ultrasound rooms during exams. Our front office staff cannot provide observation of children in our lobby area while testing is being conducted. An adult accompanying a patient to their appointment will only be allowed in the ultrasound room at the discretion of the ultrasound technician under special circumstances. We apologize for any inconvenience.   Follow-Up: At [facility], you and your health needs are our priority.  As part of our continuing mission to provide you with exceptional heart care, we have created designated Provider Care Teams.  These Care Teams  include your primary Cardiologist (physician) and Advanced Practice Providers (APPs -  Physician Assistants and Nurse Practitioners) who all work together to provide you with the care you need, when you need it.  We recommend signing up for the patient portal called "MyChart".  Sign up information is provided on this After Visit Summary.  MyChart is used to connect with patients for Virtual Visits (Telemedicine).  Patients are able to view lab/test results, encounter notes, upcoming appointments, etc.  Non-urgent messages can be sent to your provider as well.   To learn more about what you can do with MyChart, go to [email or web address].    Your next appointment:   AFTER ECHO   Provider:   Olga Millers, MD  or Marjie Skiff, PA-C

## 2023-03-04 LAB — BASIC METABOLIC PANEL
BUN/Creatinine Ratio: 16 (ref 12–28)
BUN: 15 mg/dL (ref 8–27)
CO2: 30 mmol/L — ABNORMAL HIGH (ref 20–29)
Calcium: 9.3 mg/dL (ref 8.7–10.3)
Chloride: 101 mmol/L (ref 96–106)
Creatinine, Ser: 0.96 mg/dL (ref 0.57–1.00)
Glucose: 96 mg/dL (ref 70–99)
Potassium: 5.2 mmol/L (ref 3.5–5.2)
Sodium: 143 mmol/L (ref 134–144)
eGFR: 61 mL/min/{1.73_m2} (ref >59)

## 2023-03-04 LAB — TSH: TSH: 1.25 u[IU]/mL (ref 0.450–4.500)

## 2023-03-04 LAB — BRAIN NATRIURETIC PEPTIDE: BNP: 644.9 pg/mL — ABNORMAL HIGH (ref 0.0–100.0)

## 2023-03-05 ENCOUNTER — Other Ambulatory Visit: Payer: Self-pay

## 2023-03-05 DIAGNOSIS — J9611 Chronic respiratory failure with hypoxia: Secondary | ICD-10-CM

## 2023-03-05 DIAGNOSIS — I480 Paroxysmal atrial fibrillation: Secondary | ICD-10-CM

## 2023-03-05 DIAGNOSIS — I5022 Chronic systolic (congestive) heart failure: Secondary | ICD-10-CM

## 2023-03-05 DIAGNOSIS — R0602 Shortness of breath: Secondary | ICD-10-CM

## 2023-03-05 MED ORDER — FUROSEMIDE 20 MG PO TABS: 20.0000 mg | ORAL_TABLET | Freq: Every day | ORAL | 0 refills | Status: DC

## 2023-03-06 ENCOUNTER — Telehealth: Payer: Self-pay | Admitting: Pulmonary Disease

## 2023-03-06 NOTE — Telephone Encounter (Signed)
 [employer] needs patient to be re-certified for oxygen . Fax was sent earlier this week.

## 2023-03-07 NOTE — Telephone Encounter (Signed)
 Pt will need ov to recertify for o2  I called her and there was no answer and no VM  Will try back later

## 2023-03-12 NOTE — Telephone Encounter (Signed)
I called and spoke with pt. Pt was informed to have an ov to re qualify for 02. Pt also wants a refill on albuterol. Albuterol was discontinued in 2023. Please advise. Forwarding to BW and front desk. Please schedule pt at our earliest availability.

## 2023-03-14 ENCOUNTER — Other Ambulatory Visit: Payer: Self-pay

## 2023-03-14 DIAGNOSIS — R0602 Shortness of breath: Secondary | ICD-10-CM

## 2023-03-14 DIAGNOSIS — J9611 Chronic respiratory failure with hypoxia: Secondary | ICD-10-CM

## 2023-03-14 DIAGNOSIS — I480 Paroxysmal atrial fibrillation: Secondary | ICD-10-CM

## 2023-03-14 DIAGNOSIS — I5022 Chronic systolic (congestive) heart failure: Secondary | ICD-10-CM | POA: Diagnosis not present

## 2023-03-15 LAB — BASIC METABOLIC PANEL
BUN/Creatinine Ratio: 15 (ref 12–28)
BUN: 16 mg/dL (ref 8–27)
CO2: 30 mmol/L — ABNORMAL HIGH (ref 20–29)
Calcium: 9.4 mg/dL (ref 8.7–10.3)
Chloride: 99 mmol/L (ref 96–106)
Creatinine, Ser: 1.07 mg/dL — ABNORMAL HIGH (ref 0.57–1.00)
Glucose: 110 mg/dL — ABNORMAL HIGH (ref 70–99)
Potassium: 4.5 mmol/L (ref 3.5–5.2)
Sodium: 142 mmol/L (ref 134–144)
eGFR: 54 mL/min/{1.73_m2} — ABNORMAL LOW (ref >59)

## 2023-03-24 DIAGNOSIS — I5022 Chronic systolic (congestive) heart failure: Secondary | ICD-10-CM | POA: Diagnosis not present

## 2023-03-24 DIAGNOSIS — I480 Paroxysmal atrial fibrillation: Secondary | ICD-10-CM | POA: Diagnosis not present

## 2023-03-24 DIAGNOSIS — J9611 Chronic respiratory failure with hypoxia: Secondary | ICD-10-CM | POA: Diagnosis not present

## 2023-03-24 DIAGNOSIS — R0602 Shortness of breath: Secondary | ICD-10-CM | POA: Diagnosis not present

## 2023-03-25 ENCOUNTER — Ambulatory Visit (HOSPITAL_BASED_OUTPATIENT_CLINIC_OR_DEPARTMENT_OTHER)
Admission: RE | Admit: 2023-03-25 | Discharge: 2023-03-25 | Disposition: A | Discharge status: Home / Self Care | Payer: Medicare Other | Source: Ambulatory Visit | Attending: Cardiology | Admitting: Cardiology

## 2023-03-25 DIAGNOSIS — R0602 Shortness of breath: Secondary | ICD-10-CM | POA: Insufficient documentation

## 2023-03-25 DIAGNOSIS — I255 Ischemic cardiomyopathy: Secondary | ICD-10-CM | POA: Diagnosis not present

## 2023-03-25 LAB — ECHOCARDIOGRAM COMPLETE
AR max vel: 1.83 cm2
AV Area VTI: 1.69 cm2
AV Area mean vel: 1.73 cm2
AV Mean grad: 9.5 mm[Hg]
AV Peak grad: 16.5 mm[Hg]
AV Vena cont: 0.25 cm
Ao pk vel: 2.03 m/s
Area-P 1/2: 2.76 cm2
Calc EF: 45.3 %
MV M vel: 4.74 m/s
MV Peak grad: 89.9 mm[Hg]
MV Vena cont: 0.2 cm
P 1/2 time: 816 ms
Radius: 0.4 cm
S' Lateral: 2.9 cm
Single Plane A2C EF: 44.3 %
Single Plane A4C EF: 45.1 %

## 2023-03-25 LAB — BASIC METABOLIC PANEL
BUN/Creatinine Ratio: 18 (ref 12–28)
BUN: 26 mg/dL (ref 8–27)
CO2: 29 mmol/L (ref 20–29)
Calcium: 9.8 mg/dL (ref 8.7–10.3)
Chloride: 97 mmol/L (ref 96–106)
Creatinine, Ser: 1.41 mg/dL — ABNORMAL HIGH (ref 0.57–1.00)
Glucose: 76 mg/dL (ref 70–99)
Potassium: 4.5 mmol/L (ref 3.5–5.2)
Sodium: 141 mmol/L (ref 134–144)
eGFR: 39 mL/min/{1.73_m2} — ABNORMAL LOW (ref >59)

## 2023-03-26 ENCOUNTER — Other Ambulatory Visit (HOSPITAL_COMMUNITY): Payer: Medicare Other

## 2023-03-28 NOTE — Progress Notes (Signed)
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 suggesting right atrial pressure of 3 mmHg.   IAS/Shunts: No atrial level shunt detected by color flow Doppler.      Assessment & Plan   1.  Chronic HFmrEF, ischemic cardiomyopathy-euvolemic.  Weight today 84 lbs.  Previously had elevated BNP and received 5-day course of furosemide.  Noted to have AKI with increased diuresis.  Furosemide was changed to as needed.  Echocardiogram showed LVEF of 60 to 65%, G1 DD, aortic sclerosis but no stenosis. Maintain daily weight log Heart healthy low-sodium diet Elevate lower extremities when not active Continue furosemide as needed Continue carvedilol Order BMP  Coronary artery disease-denies anginal type symptoms.  Breathing at baseline.  Cardiac catheterization 10/20 showed chronic total occlusion of RCA and circumflex with collateral flow.  Stress testing 7/21 showed inferior/inferolateral infarct but no  ischemia. Continue carvedilol, sublingual nitroglycerin as needed Heart healthy low-sodium diet  Paroxysmal atrial fibrillation-heart rate today 78.  Denies episodes of accelerated or irregular heartbeat.  Denies bleeding issues. Continue carvedilol, apixaban  Hyperlipidemia-LDL 69 on 5/24.  Statin intolerant. High-fiber diet Continue ezetimibe, Nexletol Fasting lipids and LFTs  Essential hypertension-BP today 134/64. Heart healthy low-sodium diet Increase physical activity as tolerated Maintain blood pressure log Continue carvedilol  Disposition: Follow-up with Dr. Jens Som or me in 4 to 6 months.   Thomasene Ripple. Alayzha An NP-C     03/31/2023, 11:42 AM [facility] [address] Office [phone] Fax [phone]    I spent 14 minutes examining this patient, reviewing medications, and using patient centered shared decision making involving their cardiac care.   I spent  20 minutes reviewing past medical history,  medications, and prior cardiac tests.

## 2023-03-31 ENCOUNTER — Encounter: Payer: Self-pay | Admitting: General Practice

## 2023-03-31 ENCOUNTER — Ambulatory Visit: Payer: Medicare Other | Attending: General Practice | Admitting: General Practice

## 2023-03-31 VITALS — BP 134/64 | HR 89 | Ht 64.0 in | Wt 84.2 lb

## 2023-03-31 DIAGNOSIS — I480 Paroxysmal atrial fibrillation: Secondary | ICD-10-CM | POA: Insufficient documentation

## 2023-03-31 DIAGNOSIS — I5022 Chronic systolic (congestive) heart failure: Secondary | ICD-10-CM | POA: Insufficient documentation

## 2023-03-31 DIAGNOSIS — E785 Hyperlipidemia, unspecified: Secondary | ICD-10-CM | POA: Insufficient documentation

## 2023-03-31 DIAGNOSIS — I255 Ischemic cardiomyopathy: Secondary | ICD-10-CM | POA: Diagnosis not present

## 2023-03-31 DIAGNOSIS — I10 Essential (primary) hypertension: Secondary | ICD-10-CM | POA: Insufficient documentation

## 2023-03-31 NOTE — Patient Instructions (Signed)
 Medication Instructions:  The current medical regimen is effective;  continue present plan and medications as directed. Please refer to the Current Medication list given to you today.  *If you need a refill on your cardiac medications before your next appointment, please call your pharmacy*  Lab Work: BMET LIPID AND LFT TODAY If you have labs (blood work) drawn today and your tests are completely normal, you will receive your results only by:  MyChart Message (if you have MyChart) OR  A paper copy in the mail If you have any lab test that is abnormal or we need to change your treatment, we will call you to review the results.  Testing/Procedures: NONE  Follow-Up: At [facility], you and your health needs are our priority.  As part of our continuing mission to provide you with exceptional heart care, we have created designated Provider Care Teams.  These Care Teams include your primary Cardiologist (physician) and Advanced Practice Providers (APPs -  Physician Assistants and Nurse Practitioners) who all work together to provide you with the care you need, when you need it.  We recommend signing up for the patient portal called "MyChart".  Sign up information is provided on this After Visit Summary.  MyChart is used to connect with patients for Virtual Visits (Telemedicine).  Patients are able to view lab/test results, encounter notes, upcoming appointments, etc.  Non-urgent messages can be sent to your provider as well.   To learn more about what you can do with MyChart, go to [email or web address].    Your next appointment:   4-6 month(s)  Provider:   Olga Millers, MD  or Edd Fabian, FNP or Marjie Skiff, PA-C        Other Instructions CALL PRIMARY MONDAY IF SINUS ISSUES CONTINUE

## 2023-04-01 LAB — HEPATIC FUNCTION PANEL
ALT: 6 IU/L (ref 0–32)
AST: 17 IU/L (ref 0–40)
Albumin: 4 g/dL (ref 3.8–4.8)
Alkaline Phosphatase: 16 IU/L — ABNORMAL LOW (ref 44–121)
Bilirubin Total: 0.5 mg/dL (ref 0.0–1.2)
Bilirubin, Direct: 0.13 mg/dL (ref 0.00–0.40)
Total Protein: 6.5 g/dL (ref 6.0–8.5)

## 2023-04-01 LAB — BASIC METABOLIC PANEL
BUN/Creatinine Ratio: 14 (ref 12–28)
BUN: 15 mg/dL (ref 8–27)
CO2: 28 mmol/L (ref 20–29)
Calcium: 9.4 mg/dL (ref 8.7–10.3)
Chloride: 100 mmol/L (ref 96–106)
Creatinine, Ser: 1.1 mg/dL — ABNORMAL HIGH (ref 0.57–1.00)
Glucose: 68 mg/dL — ABNORMAL LOW (ref 70–99)
Potassium: 5.1 mmol/L (ref 3.5–5.2)
Sodium: 141 mmol/L (ref 134–144)
eGFR: 52 mL/min/{1.73_m2} — ABNORMAL LOW (ref >59)

## 2023-04-01 LAB — LIPID PANEL
Chol/HDL Ratio: 2.6 ratio (ref 0.0–4.4)
Cholesterol, Total: 151 mg/dL (ref 100–199)
HDL: 57 mg/dL (ref >39)
LDL Chol Calc (NIH): 81 mg/dL (ref 0–99)
Triglycerides: 64 mg/dL (ref 0–149)
VLDL Cholesterol Cal: 13 mg/dL (ref 5–40)

## 2023-04-02 ENCOUNTER — Ambulatory Visit: Payer: Self-pay | Admitting: Pulmonary Disease

## 2023-04-02 ENCOUNTER — Other Ambulatory Visit: Payer: Self-pay | Admitting: Student

## 2023-04-02 NOTE — Telephone Encounter (Signed)
Routing to Dr. Dewald as an FYI. 

## 2023-04-02 NOTE — Telephone Encounter (Signed)
 Call from [employer] office, spoke to [name], office reporting pt experiencing feels "like crap," asking for "prednisone and antibx, green" nasal discharge, "cough is in upper part of chest." Speaking to pt.  TRIAGE SUMMARY NOTE: Pt reporting she is having more difficulty breathing than normal, coughing up "green and yellowish" phlegm, difficulty breathing through nose with nasal congestion and green discharge. Pt requesting prednisone and antibx. Pt reporting intermittent wheezing and having titrated her oxygen to 3L on her own, using albuterol inhaler more often, even so pulse ox dropping to 75% at lowest and only recovering to 80s%, max pulse ox this week 91%, also experiencing intermittent 5/10 chest discomfort with blowing nose and 8-9/10 pain in chest with coughing. Advised pt be examined asap, advised ED for more immediate care since pulse ox continuously below 90%. Pt declines, requesting prednisone and antibx, connected to [employer] office front desk, office scheduled appt for tomorrow afternoon. Advised seek care asap especially if worsening. Sending message to pulm office for further recommendations as needed, pt also requesting refill of albuterol inhaler but has 25 puffs left on it.  E2C2 Pulmonary Triage - Initial Assessment Questions "Chief Complaint (e.g., cough, sob, wheezing, fever, chills, sweat or additional symptoms) *Go to specific symptom protocol after initial questions. Coughing up little bit green and yellowish Having more SOB than normal [name] with cardio, kept her on lasix, thought fluid build-up in chest, chest felt heavy no pain, did echocardiogram recently and for past month and a half been getting blood work with Dr. Jens Som, everything looking good with heart Wet cough Nose is dry and crusted, clogged Try to breathe through nose than mouth since on oxygen Have appt with pulm in 2 weeks, hopefully something to help me make  it to then Wheezing comes and goes, if can blow nose and cough and move mucus around it helps then it comes back and wheezing comes back with it No fever, chills, or body aches At times when drops down to 79 or 75, use albuterol inhaler, evens things up and builds up into the 80s, talking to nurse been 86-89% pulse ox No chest pain, feel it in upper chest like not a heavy heavy feeling like when I cough I can feel it right in the upper chest so know it's congestion, hurts in the chest, straight across the collar bone, just when cough, don't feel all the time, between 7-8 out of 10 when cough, over the year feels like someone pulling out every organ in my body, mainly when I cough, sometimes when blow nose have chest discomfort 5/10 Feel like don't have energy to do nothing No more difficulty standing or walking than normal If chest pain worsens take nitroglycerin, has not had to take over past few days  "How long have symptoms been present?" Really gotten bad within the last week  Have you tested for COVID or Flu? Note: If not, ask patient if a home test can be taken. If so, instruct patient to call back for positive results. No but don't have it  MEDICINES:   "Have you used any OTC meds to help with symptoms?" No If yes, ask "What medications?" Told me not to or don't do well, ended up in hospital last time, need to call doc first, no OTC, been taking tylenol though  "Have you used your inhalers/maintenance medication?" Yes If yes, "What medications?" Rescue inhaler  If inhaler, ask "How many puffs and how often?"  Note: Review instructions on medication in the chart. About out of albuterol rescue inhaler As many times as I feel necessary maybe 3-4x some days, especially if I move around, pulse ox drops even lower 79-80%, just sit down and rest for little bit and comes back up to at least 85-87-89%  OXYGEN: "Do you wear supplemental oxygen?" Yes If yes, "How many liters are you supposed  to use?" 2L, especially with portable one pump it up to 3L because moving around, should maintain me to good level  "Do you monitor your oxygen levels?" Yes If yes, "What is your reading (oxygen level) today?" 85% then come up 90-91% for the past couple days maybe a week  "What is your usual oxygen saturation reading?"  (Note: Pulmonary O2 sats should be 90% or greater) 90-94-95%  Additional notes: Really don't want to go to ED, have appt to see y'all in 2 weeks Front desk scheduled for 1:45 pm tomorrow  Reason for Disposition  Oxygen level (e.g., pulse oximetry) 90 percent or lower  Answer Assessment - Initial Assessment Questions 4. SEVERITY: "How bad is your breathing?" (e.g., mild, moderate, severe)    - MILD: No SOB at rest, mild SOB with walking, speaks normally in sentences, can lie down, no retractions, pulse < 100.    - MODERATE: SOB at rest, SOB with minimal exertion and prefers to sit, cannot lie down flat, speaks in phrases, mild retractions, audible wheezing, pulse 100-120.    - SEVERE: Very SOB at rest, speaks in single words, struggling to breathe, sitting hunched forward, retractions, pulse > 120      Feels moderate not severe yet 5. RECURRENT SYMPTOM: "Have you had difficulty breathing before?" If Yes, ask: "When was the last time?" and "What happened that time?"      yes 6. CARDIAC HISTORY: "Do you have any history of heart disease?" (e.g., heart attack, angina, bypass surgery, angioplasty)      Significant hx 7. LUNG HISTORY: "Do you have any history of lung disease?"  (e.g., pulmonary embolus, asthma, emphysema)     Significant hx  Answer Assessment - Initial Assessment Questions 5. SEVERITY: "How bad is this attack?"    - MILD: No SOB at rest, mild SOB with walking, speaks normally in sentences, can lie down, no retractions, pulse < 100. (GREEN Zone: PEFR 80-100%)   - MODERATE: SOB at rest, SOB with minimal exertion and prefers to sit, cannot lie down flat, speaks  in phrases, mild retractions, audible wheezing, pulse 100-120. (YELLOW Zone: PEFR 50-79%)    - SEVERE: Struggling for each breath, speaks in single words, struggling to breathe, sitting hunched forward, retractions, usually loud wheezing, sometimes minimal wheezing because of decreased air movement, pulse > 120. (RED Zone: PEFR < 50%).      Feels moderate not severe yet  Protocols used: Breathing Difficulty-A-AH, Asthma Attack-A-AH

## 2023-04-03 ENCOUNTER — Encounter: Payer: Self-pay | Admitting: Pulmonary Disease

## 2023-04-03 ENCOUNTER — Ambulatory Visit: Admitting: Pulmonary Disease

## 2023-04-03 VITALS — BP 140/60 | HR 69 | Ht 64.0 in | Wt 84.6 lb

## 2023-04-03 DIAGNOSIS — J9610 Chronic respiratory failure, unspecified whether with hypoxia or hypercapnia: Secondary | ICD-10-CM | POA: Diagnosis not present

## 2023-04-03 DIAGNOSIS — J449 Chronic obstructive pulmonary disease, unspecified: Secondary | ICD-10-CM

## 2023-04-03 DIAGNOSIS — J42 Unspecified chronic bronchitis: Secondary | ICD-10-CM

## 2023-04-03 DIAGNOSIS — I5032 Chronic diastolic (congestive) heart failure: Secondary | ICD-10-CM

## 2023-04-03 DIAGNOSIS — Z87891 Personal history of nicotine dependence: Secondary | ICD-10-CM | POA: Diagnosis not present

## 2023-04-03 MED ORDER — AZITHROMYCIN 250 MG PO TABS: 250.0000 mg | ORAL_TABLET | ORAL | 0 refills | Status: DC

## 2023-04-03 MED ORDER — PREDNISONE 20 MG PO TABS: 20.0000 mg | ORAL_TABLET | Freq: Every day | ORAL | 0 refills | Status: DC

## 2023-04-03 MED ORDER — SODIUM CHLORIDE 3 % IN NEBU: INHALATION_SOLUTION | RESPIRATORY_TRACT | 12 refills | Status: AC | PRN

## 2023-04-03 MED ORDER — AZITHROMYCIN 250 MG PO TABS: ORAL_TABLET | ORAL | 0 refills | Status: DC

## 2023-04-03 NOTE — Patient Instructions (Addendum)
 DME DME referral for flutter device/Acapella  Alternative IMT/PEP device  Prescription for azithromycin  Prescription for prednisone  Prescription for hypertonic saline to be used twice a day in your nebulizer  Long-term azithromycin for advanced lung disease  Change follow-up to 4 weeks from here  Call us with significant concerns   What we looked up online which I said you can look up yourself is called -IMT/PEP device -Flutter device-Acapella  -This will help clear secretions
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
## 2023-04-10 ENCOUNTER — Telehealth: Payer: Self-pay | Admitting: Cardiology

## 2023-04-10 DIAGNOSIS — E785 Hyperlipidemia, unspecified: Secondary | ICD-10-CM

## 2023-04-10 MED ORDER — EZETIMIBE 10 MG PO TABS: 10.0000 mg | ORAL_TABLET | Freq: Every day | ORAL | 3 refills | Status: AC

## 2023-04-10 NOTE — Telephone Encounter (Signed)
 Pt's medication was sent to pt's pharmacy as requested. Confirmation received.

## 2023-04-10 NOTE — Telephone Encounter (Signed)
*  STAT* If patient is at the pharmacy, call can be transferred to refill team.   1. Which medications need to be refilled? (please list name of each medication and dose if known) ezetimibe (ZETIA) 10 MG tablet   2. Which pharmacy/location (including street and city if local pharmacy) is medication to be sent to? [id number] - [place], [place] - [address]    3. Do they need a 30 day or 90 day supply? 90

## 2023-04-11 DIAGNOSIS — H0102A Squamous blepharitis right eye, upper and lower eyelids: Secondary | ICD-10-CM | POA: Diagnosis not present

## 2023-04-11 DIAGNOSIS — H35373 Puckering of macula, bilateral: Secondary | ICD-10-CM | POA: Diagnosis not present

## 2023-04-11 DIAGNOSIS — H0102B Squamous blepharitis left eye, upper and lower eyelids: Secondary | ICD-10-CM | POA: Diagnosis not present

## 2023-04-11 DIAGNOSIS — H1045 Other chronic allergic conjunctivitis: Secondary | ICD-10-CM | POA: Diagnosis not present

## 2023-04-18 ENCOUNTER — Ambulatory Visit: Payer: Medicare Other | Admitting: Primary Care

## 2023-04-30 ENCOUNTER — Other Ambulatory Visit: Payer: Self-pay | Admitting: Student

## 2023-05-02 DIAGNOSIS — Z1231 Encounter for screening mammogram for malignant neoplasm of breast: Secondary | ICD-10-CM | POA: Diagnosis not present

## 2023-05-14 ENCOUNTER — Other Ambulatory Visit: Payer: Self-pay | Admitting: Pulmonary Disease

## 2023-05-22 ENCOUNTER — Ambulatory Visit: Admitting: Pulmonary Disease

## 2023-05-22 ENCOUNTER — Encounter: Payer: Self-pay | Admitting: Pulmonary Disease

## 2023-05-22 VITALS — BP 104/58 | HR 58 | Ht 64.0 in | Wt 81.0 lb

## 2023-05-22 DIAGNOSIS — J449 Chronic obstructive pulmonary disease, unspecified: Secondary | ICD-10-CM

## 2023-05-22 DIAGNOSIS — J42 Unspecified chronic bronchitis: Secondary | ICD-10-CM | POA: Diagnosis not present

## 2023-05-22 DIAGNOSIS — Z87891 Personal history of nicotine dependence: Secondary | ICD-10-CM | POA: Diagnosis not present

## 2023-05-22 LAB — CBC WITH DIFFERENTIAL/PLATELET
Basophils Absolute: 0 10*3/uL (ref 0.0–0.1)
Basophils Relative: 0.6 % (ref 0.0–3.0)
Eosinophils Absolute: 0.2 10*3/uL (ref 0.0–0.7)
Eosinophils Relative: 2.9 % (ref 0.0–5.0)
HCT: 29.7 % — ABNORMAL LOW (ref 36.0–46.0)
Hemoglobin: 9.7 g/dL — ABNORMAL LOW (ref 12.0–15.0)
Lymphocytes Relative: 40.5 % (ref 12.0–46.0)
Lymphs Abs: 2.7 10*3/uL (ref 0.7–4.0)
MCHC: 32.5 g/dL (ref 30.0–36.0)
MCV: 90 fl (ref 78.0–100.0)
Monocytes Absolute: 0.6 10*3/uL (ref 0.1–1.0)
Monocytes Relative: 8.8 % (ref 3.0–12.0)
Neutro Abs: 3.2 10*3/uL (ref 1.4–7.7)
Neutrophils Relative %: 47.2 % (ref 43.0–77.0)
Platelets: 182 10*3/uL (ref 150.0–400.0)
RBC: 3.31 Mil/uL — ABNORMAL LOW (ref 3.87–5.11)
RDW: 13.5 % (ref 11.5–15.5)
WBC: 6.7 10*3/uL (ref 4.0–10.5)

## 2023-05-22 MED ORDER — AZITHROMYCIN 500 MG PO TABS
500.0000 mg | ORAL_TABLET | ORAL | 3 refills | Status: DC
Start: 2023-05-23 — End: 2023-08-26

## 2023-05-22 MED ORDER — ALBUTEROL SULFATE HFA 108 (90 BASE) MCG/ACT IN AERS: 1.0000 | INHALATION_SPRAY | Freq: Four times a day (QID) | RESPIRATORY_TRACT | 11 refills | Status: DC | PRN
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
## 2023-05-22 NOTE — Patient Instructions (Addendum)
 Continue trelegy ellipta  1 puff daily - rinse mouth out after each use  Use albuterol  inhaler or nebs as needed  Take azithromycin  500mg  M-W-F  We will consider putting you on daily prednisone  or dupixent injections for on going issues with your breathing.   Standing lab orders have been placed to check labs after May 18th if you are able to stay off prednisone   Follow up in 3 months, call sooner if needed

## 2023-05-23 ENCOUNTER — Encounter: Payer: Self-pay | Admitting: Pulmonary Disease

## 2023-05-23 LAB — IGE: IgE (Immunoglobulin E), Serum: 32 kU/L (ref <=114)

## 2023-06-30 ENCOUNTER — Other Ambulatory Visit: Payer: Self-pay | Admitting: Internal Medicine

## 2023-06-30 ENCOUNTER — Other Ambulatory Visit: Payer: Self-pay | Admitting: Pulmonary Disease
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
## 2023-07-22 ENCOUNTER — Other Ambulatory Visit: Payer: Self-pay | Admitting: Pulmonary Disease

## 2023-07-22 ENCOUNTER — Other Ambulatory Visit: Payer: Self-pay

## 2023-07-22 ENCOUNTER — Other Ambulatory Visit

## 2023-07-22 ENCOUNTER — Other Ambulatory Visit (INDEPENDENT_AMBULATORY_CARE_PROVIDER_SITE_OTHER)

## 2023-07-22 ENCOUNTER — Ambulatory Visit: Attending: Cardiology | Admitting: Cardiology

## 2023-07-22 ENCOUNTER — Encounter: Payer: Self-pay | Admitting: Cardiology

## 2023-07-22 DIAGNOSIS — J302 Other seasonal allergic rhinitis: Secondary | ICD-10-CM

## 2023-07-22 DIAGNOSIS — J441 Chronic obstructive pulmonary disease with (acute) exacerbation: Secondary | ICD-10-CM

## 2023-07-22 DIAGNOSIS — I340 Nonrheumatic mitral (valve) insufficiency: Secondary | ICD-10-CM | POA: Diagnosis not present

## 2023-07-22 DIAGNOSIS — I739 Peripheral vascular disease, unspecified: Secondary | ICD-10-CM | POA: Diagnosis not present

## 2023-07-22 DIAGNOSIS — E785 Hyperlipidemia, unspecified: Secondary | ICD-10-CM | POA: Insufficient documentation

## 2023-07-22 DIAGNOSIS — I2510 Atherosclerotic heart disease of native coronary artery without angina pectoris: Secondary | ICD-10-CM | POA: Diagnosis not present

## 2023-07-22 DIAGNOSIS — I6523 Occlusion and stenosis of bilateral carotid arteries: Secondary | ICD-10-CM | POA: Diagnosis not present

## 2023-07-22 DIAGNOSIS — I10 Essential (primary) hypertension: Secondary | ICD-10-CM | POA: Diagnosis not present

## 2023-07-22 DIAGNOSIS — I255 Ischemic cardiomyopathy: Secondary | ICD-10-CM | POA: Insufficient documentation

## 2023-07-22 DIAGNOSIS — J42 Unspecified chronic bronchitis: Secondary | ICD-10-CM | POA: Diagnosis not present

## 2023-07-22 DIAGNOSIS — I351 Nonrheumatic aortic (valve) insufficiency: Secondary | ICD-10-CM | POA: Diagnosis not present

## 2023-07-22 DIAGNOSIS — I071 Rheumatic tricuspid insufficiency: Secondary | ICD-10-CM | POA: Insufficient documentation

## 2023-07-22 DIAGNOSIS — J9611 Chronic respiratory failure with hypoxia: Secondary | ICD-10-CM

## 2023-07-22 LAB — CBC WITH DIFFERENTIAL/PLATELET
Basophils Absolute: 0 10*3/uL (ref 0.0–0.1)
Basophils Relative: 0.7 % (ref 0.0–3.0)
Eosinophils Absolute: 0.2 10*3/uL (ref 0.0–0.7)
Eosinophils Relative: 3.3 % (ref 0.0–5.0)
HCT: 29.6 % — ABNORMAL LOW (ref 36.0–46.0)
Hemoglobin: 9.4 g/dL — ABNORMAL LOW (ref 12.0–15.0)
Lymphocytes Relative: 34.6 % (ref 12.0–46.0)
Lymphs Abs: 1.8 10*3/uL (ref 0.7–4.0)
MCHC: 31.9 g/dL (ref 30.0–36.0)
MCV: 88.7 fl (ref 78.0–100.0)
Monocytes Absolute: 0.4 10*3/uL (ref 0.1–1.0)
Monocytes Relative: 6.6 % (ref 3.0–12.0)
Neutro Abs: 2.9 10*3/uL (ref 1.4–7.7)
Neutrophils Relative %: 54.8 % (ref 43.0–77.0)
Platelets: 164 10*3/uL (ref 150.0–400.0)
RBC: 3.33 Mil/uL — ABNORMAL LOW (ref 3.87–5.11)
RDW: 13.1 % (ref 11.5–15.5)
WBC: 5.3 10*3/uL (ref 4.0–10.5)

## 2023-07-22 NOTE — Patient Instructions (Signed)

## 2023-07-23 LAB — IGE: IgE (Immunoglobulin E), Serum: 28 kU/L (ref <=114)

## 2023-08-07 DIAGNOSIS — H9201 Otalgia, right ear: Secondary | ICD-10-CM | POA: Diagnosis not present

## 2023-08-07 DIAGNOSIS — H6123 Impacted cerumen, bilateral: Secondary | ICD-10-CM | POA: Diagnosis not present

## 2023-08-11 ENCOUNTER — Other Ambulatory Visit: Payer: Self-pay | Admitting: Student

## 2023-08-11 DIAGNOSIS — I255 Ischemic cardiomyopathy: Secondary | ICD-10-CM

## 2023-08-11 DIAGNOSIS — I10 Essential (primary) hypertension: Secondary | ICD-10-CM

## 2023-08-11 DIAGNOSIS — I2510 Atherosclerotic heart disease of native coronary artery without angina pectoris: Secondary | ICD-10-CM

## 2023-08-11 DIAGNOSIS — I340 Nonrheumatic mitral (valve) insufficiency: Secondary | ICD-10-CM

## 2023-08-11 DIAGNOSIS — I739 Peripheral vascular disease, unspecified: Secondary | ICD-10-CM

## 2023-08-11 DIAGNOSIS — I6523 Occlusion and stenosis of bilateral carotid arteries: Secondary | ICD-10-CM

## 2023-08-11 DIAGNOSIS — I071 Rheumatic tricuspid insufficiency: Secondary | ICD-10-CM

## 2023-08-11 DIAGNOSIS — E785 Hyperlipidemia, unspecified: Secondary | ICD-10-CM

## 2023-08-11 DIAGNOSIS — I351 Nonrheumatic aortic (valve) insufficiency: Secondary | ICD-10-CM

## 2023-08-14 ENCOUNTER — Ambulatory Visit: Payer: Self-pay

## 2023-08-14 DIAGNOSIS — J441 Chronic obstructive pulmonary disease with (acute) exacerbation: Secondary | ICD-10-CM

## 2023-08-14 NOTE — Telephone Encounter (Signed)
 FYI Only or Action Required?: Action required by provider: Rx request.  Patient is followed in Pulmonology for COPD, last seen on 05/22/2023 by Kara Dorn NOVAK, MD.  Called Nurse Triage reporting Shortness of Breath.  Symptoms began a week ago.  Interventions attempted: Maintenance inhaler, Nebulizer treatments, and Home oxygen  use.  Symptoms are: gradually worsening.  Triage Disposition: Go to ED Now (Notify PCP)  Patient/caregiver understands and will follow disposition?: No, wishes to speak with PCP      Copied from CRM [id number]. Topic: Clinical - Red Word Triage >> Aug 14, 2023  2:50 PM Leila C wrote: Red Word that prompted transfer to Nurse Triage: Patient [phone] states breathing is raggedy- not getting enough air, oxygen  is around 80 level, shortness of breath, wheezing even with the oxygen  machine, and both feet swelling, painful especially when trying to walk. Patient denies dizziness trying to stay seated, nor fever. Patient wants Dr. Kara to send Prednisone , which helped in the past. Please advise.   [id number] [place], [place] - [address] [place] [address] Phone: [phone] Fax: [phone] Reason for Disposition  Oxygen  level (e.g., pulse oximetry) 90% or lower  Answer Assessment - Initial Assessment Questions E2C2 Pulmonary Triage - Initial Assessment Questions Chief Complaint (e.g., cough, sob, wheezing, fever, chills, sweat or additional symptoms) *Go to specific symptom protocol after initial questions. SOB, wheeze, bilateral feet swelling to ankles  How long have symptoms been present? X 1 week  Have you tested for COVID or Flu? Note: If not, ask patient if a home test can be taken. If so, instruct patient to call back for positive results. No  MEDICINES:   Have you used any OTC meds to help with symptoms? No If yes, ask What medications? N/a  Have you used your inhalers/maintenance  medication? Yes If yes, What medications? Trellegy - 1 puff daily Albuterol  NEB - 2 hours ago Needs albuterol  refills  If inhaler, ask How many puffs and how often? Note: Review instructions on medication in the chart. See above  OXYGEN : Do you wear supplemental oxygen ? Yes If yes, How many liters are you supposed to use? 2L ATC  Do you monitor your oxygen  levels? Yes If yes, What is your reading (oxygen  level) today? 80-85 this AM on 2L Currently at 87-89% on 2 L  What is your usual oxygen  saturation reading?  (Note: Pulmonary O2 sats should be 90% or greater) 90s    1. RESPIRATORY STATUS: Describe your breathing? (e.g., wheezing, shortness of breath, unable to speak, severe coughing)      See above 2. ONSET: When did this breathing problem begin?      See above 3. PATTERN Does the difficult breathing come and go, or has it been constant since it started?      constant 4. SEVERITY: How bad is your breathing? (e.g., mild, moderate, severe)      Mild- worse with exertion Triager does not appreciate audible SOB/wheezing during call. Pt is speaking in full sentences.  5. RECURRENT SYMPTOM: Have you had difficulty breathing before? If Yes, ask: When was the last time? and What happened that time?      Yes, unknown last time-- reports steroids help 6. CARDIAC HISTORY: Do you have any history of heart disease? (e.g., heart attack, angina, bypass surgery, angioplasty)      CHF - does not have water pills 7. LUNG HISTORY: Do you have any history of lung disease?  (  e.g., pulmonary embolus, asthma, emphysema)     COPD 8. CAUSE: What do you think is causing the breathing problem?      Possible flare 9. OTHER SYMPTOMS: Do you have any other symptoms? (e.g., chest pain, cough, dizziness, fever, runny nose)     Bilateral feet swelling, runny nose Denies other sx 10. O2 SATURATION MONITOR:  Do you use an oxygen  saturation monitor (pulse oximeter) at  home? If Yes, ask: What is your reading (oxygen  level) today? What is your usual oxygen  saturation reading? (e.g., 95%)       See above 11. PREGNANCY: Is there any chance you are pregnant? When was your last menstrual period?       N/a 12. TRAVEL: Have you traveled out of the country in the last month? (e.g., travel history, exposures)       N/a    [facility]ager strongly recommended ED for further evaluation. Pt requesting steroids as it has helped in the past. Triager will forward encounter for Dr. Kara 's office to review and advise. Patient verbalized understanding and is expecting call back from office for next steps. Triager also advised that if pt does not hear back from office, to follow disposition for further evaluation/treatment.  Protocols used: Breathing Difficulty-A-AH

## 2023-08-14 NOTE — Telephone Encounter (Signed)
 Tried calling the patient, unable to go through- invalid number starting beeping.

## 2023-08-14 NOTE — Telephone Encounter (Signed)
 Please send in 40mg  prednisone  daily x 5 days and a Zpak for COPD exacerbation  Thanks, JD

## 2023-08-14 NOTE — Telephone Encounter (Signed)
 Please advise pt is requesting rx for Prednisone  after advising she should go to ER

## 2023-08-15 MED ORDER — AZITHROMYCIN 250 MG PO TABS: ORAL_TABLET | ORAL | 0 refills | Status: DC

## 2023-08-15 MED ORDER — PREDNISONE 10 MG PO TABS: ORAL_TABLET | ORAL | 0 refills | Status: DC

## 2023-08-15 NOTE — Addendum Note (Signed)
 Addended by: Izack Hoogland T on: 08/15/2023 02:26 PM   Modules accepted: Orders

## 2023-08-15 NOTE — Telephone Encounter (Signed)
 I called and spoke to pt. Pt informed of Dr Luann note and verbalized understanding. NFN. RX sent in.   [facility] [place].

## 2023-08-16 ENCOUNTER — Telehealth: Payer: Self-pay | Admitting: Pulmonary Disease

## 2023-08-16 ENCOUNTER — Other Ambulatory Visit: Payer: Self-pay | Admitting: Pulmonary Disease

## 2023-08-16 MED ORDER — AZITHROMYCIN 250 MG PO TABS
ORAL_TABLET | ORAL | 0 refills | Status: DC
Start: 2023-08-16 — End: 2023-08-26

## 2023-08-16 MED ORDER — PREDNISONE 20 MG PO TABS: 40.0000 mg | ORAL_TABLET | Freq: Every day | ORAL | 0 refills | Status: AC

## 2023-08-16 NOTE — Telephone Encounter (Signed)
 Patient called answering service   Not able to pick up medications at pharmacy  Will send in medication to pharmacy-Azithromycin  and prednisone 

## 2023-08-26 ENCOUNTER — Ambulatory Visit (INDEPENDENT_AMBULATORY_CARE_PROVIDER_SITE_OTHER): Admitting: Pulmonary Disease

## 2023-08-26 ENCOUNTER — Telehealth: Payer: Self-pay

## 2023-08-26 ENCOUNTER — Encounter: Payer: Self-pay | Admitting: Pulmonary Disease

## 2023-08-26 VITALS — BP 108/70 | HR 89 | Temp 98.3°F | Ht 64.0 in | Wt 87.0 lb

## 2023-08-26 DIAGNOSIS — J9611 Chronic respiratory failure with hypoxia: Secondary | ICD-10-CM | POA: Diagnosis not present

## 2023-08-26 DIAGNOSIS — Z87891 Personal history of nicotine dependence: Secondary | ICD-10-CM | POA: Diagnosis not present

## 2023-08-26 DIAGNOSIS — J449 Chronic obstructive pulmonary disease, unspecified: Secondary | ICD-10-CM

## 2023-08-26 DIAGNOSIS — J42 Unspecified chronic bronchitis: Secondary | ICD-10-CM

## 2023-08-26 MED ORDER — ALBUTEROL SULFATE (2.5 MG/3ML) 0.083% IN NEBU: 2.5000 mg | INHALATION_SOLUTION | Freq: Four times a day (QID) | RESPIRATORY_TRACT | 11 refills | Status: AC | PRN

## 2023-08-26 MED ORDER — AZITHROMYCIN 500 MG PO TABS: 500.0000 mg | ORAL_TABLET | ORAL | 3 refills | Status: AC

## 2023-08-26 MED ORDER — TRELEGY ELLIPTA 100-62.5-25 MCG/ACT IN AEPB: INHALATION_SPRAY | RESPIRATORY_TRACT | Status: DC

## 2023-08-26 MED ORDER — TRELEGY ELLIPTA 200-62.5-25 MCG/ACT IN AEPB: 1.0000 | INHALATION_SPRAY | Freq: Every day | RESPIRATORY_TRACT | 11 refills | Status: AC

## 2023-08-26 MED ORDER — ALBUTEROL SULFATE HFA 108 (90 BASE) MCG/ACT IN AERS: 1.0000 | INHALATION_SPRAY | Freq: Four times a day (QID) | RESPIRATORY_TRACT | 11 refills | Status: AC | PRN

## 2023-08-26 MED ORDER — MONTELUKAST SODIUM 10 MG PO TABS: 10.0000 mg | ORAL_TABLET | Freq: Every day | ORAL | 11 refills | Status: DC

## 2023-08-26 NOTE — Addendum Note (Signed)
 Addended by: Sabel Hornbeck M on: 08/26/2023 03:09 PM   Modules accepted: Orders
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
## 2023-08-26 NOTE — Telephone Encounter (Signed)
 Dupixent paperwork handed into pharmacy

## 2023-08-26 NOTE — Patient Instructions (Addendum)
 We will plan to start you on dupixent injections for your COPD twice per month  Continue trelegy 200mcg 1 puff daily - rinse mouth out after each use  Use albuterol  inhaler 1-2 puffs every 4-6 hours as needed  Continue montelukast  10mg  daily  Continue azithromycin  1 tab, 3 days per week  Continue supplemental oxygen  to maintain O2 saturations 88% or higher  Follow up in 3 months, call sooner if needed

## 2023-09-10 ENCOUNTER — Telehealth: Payer: Self-pay

## 2023-09-10 NOTE — Telephone Encounter (Addendum)
 Received Dupixent new start paperwork.  Submitted a Prior Authorization request to [facility] for DUPIXENT via CoverMyMeds. Will update once we receive a response.  Key: Tonya Harper, PharmD, MPH, BCPS, CPP Clinical Pharmacist (Rheumatology and Pulmonology)

## 2023-09-15 NOTE — Telephone Encounter (Signed)
 Received a fax regarding Prior Authorization from [facility] for DUPIXENT. Authorization has been DENIED because patients Eos Abs value was 200 per most recent CBC W/ Diff. Plan requires that pt has a minimum Eos Abs of 300 in order to qualify for treatment.  Phone# [phone]

## 2023-09-18 ENCOUNTER — Telehealth: Payer: Self-pay

## 2023-09-18 NOTE — Telephone Encounter (Signed)
 Initially started an authorization for Dupixent but will now be moving forward with Nucala due to Eosinophils being less than 300.  Submitted a Prior Authorization request to [facility] for NUCALA via CoverMyMeds. Will update once we receive a response.  Key: [id number]

## 2023-09-18 NOTE — Telephone Encounter (Addendum)
 D/w Dr. Kara. 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  Dose: 100mg  subcut every 4 weeks  The BOREAS trial excluded patients with COPD who had eosinophil < 300 which is what insurance is basing their criteria off of  Sherry Pennant, PharmD, MPH, BCPS, CPP Clinical Pharmacist (Rheumatology and Pulmonology)

## 2023-09-18 NOTE — Telephone Encounter (Signed)
 Will begin Nucala Biv in separate encounter.

## 2023-09-19 NOTE — Telephone Encounter (Signed)
 Submitted Patient Assistance Application to [facility] to Nucala for [place] along with provider portion, patient portion, PA denial letter, medication list, and insurance card copy. This was at the request of Adina Lesches ([employer] drug rep) in order to get their cases into the system so that they can work with [employer].   Phone #: [phone] Fax #: [phone]

## 2023-09-19 NOTE — Telephone Encounter (Signed)
 Received a fax regarding Prior Authorization from [facility] for NUCALA. Authorization has been DENIED because patient must have severe asthma, EGPA, HE'S, or CRSwNP  Submitted an URGENT appeal to [facility] for NUCALA.  Reference # [id number] Phone: [phone] Fax: [phone]   Sherry Pennant, PharmD, MPH, BCPS, CPP Clinical Pharmacist (Rheumatology and Pulmonology)

## 2023-09-22 ENCOUNTER — Other Ambulatory Visit (HOSPITAL_COMMUNITY): Payer: Self-pay

## 2023-09-22 NOTE — Telephone Encounter (Signed)
 Copied from CRM [id number]. Topic: Clinical - Prescription Issue >> Sep 22, 2023 12:51 PM Corean SAUNDERS wrote: Reason for CRM: [place] from [facility] calling to update clinic that this patients prior authorization review will be completed by end of day.   PA Approved per previous note.

## 2023-09-22 NOTE — Telephone Encounter (Signed)
 Received notification from [facility] regarding a prior authorization for NUCALA. Authorization has been APPROVED from 08/23/23 to 03/24/24. Approval letter sent to scan center.  Per test claim medication is not approved to be dispensed through [facility] and must be filled through in network pharmacy. Faxed approval letter to [employer] for benefits investigation.  Patient must fill through [facility]: [phone]  Authorization # [id number] A Phone # [phone]

## 2023-09-25 NOTE — Telephone Encounter (Signed)
 Patient has Nurse, learning disability. Patient enrolled into [place] copay card: ID: [id number] Group: [id number] Rx Bin: [id number] PCN: [id number]  Patient can be scheduled for Nucala new start using sample  Sherry Pennant, PharmD, MPH, BCPS, CPP Clinical Pharmacist (Rheumatology and Pulmonology)

## 2023-09-25 NOTE — Telephone Encounter (Signed)
 Patient scheduled for Nucala new start on 10/02/2023. Will use sample.  She is concerned about injections and needles regardless if it is syringe or autoinjector device. She is also concerned about her very little body fat. She will plan to bring someone with her to new start appt  Tonya Harper, PharmD, MPH, BCPS, CPP Clinical Pharmacist (Rheumatology and Pulmonology)

## 2023-09-29 ENCOUNTER — Other Ambulatory Visit: Payer: Self-pay | Admitting: Internal Medicine

## 2023-10-02 ENCOUNTER — Telehealth: Payer: Self-pay | Admitting: Pharmacist

## 2023-10-02 ENCOUNTER — Other Ambulatory Visit

## 2023-10-02 NOTE — Telephone Encounter (Signed)
 Please call and reschedule appt. This does not need to be routed to pharmacy team  Sherry Pennant, PharmD, MPH, BCPS, CPP Clinical Pharmacist (Rheumatology and Pulmonology)

## 2023-10-02 NOTE — Telephone Encounter (Signed)
 Copied from CRM [id number]. Topic: Appointments - Scheduling Inquiry for Clinic >> Oct 02, 2023 11:17 AM Celestine FALCON wrote: Reason for CRM: Pt has a pharmacy visit with [facility] Aleck LITTIE Puls today at 1pm, but the pt will not be able to make it. Pt would like a call back to reschedule the appt.   Pt's phone number is [phone] ok to leave a vm.

## 2023-10-03 NOTE — Telephone Encounter (Signed)
 Patient scheduled.

## 2023-10-06 ENCOUNTER — Ambulatory Visit

## 2023-10-06 DIAGNOSIS — Z7189 Other specified counseling: Secondary | ICD-10-CM

## 2023-10-06 DIAGNOSIS — J449 Chronic obstructive pulmonary disease, unspecified: Secondary | ICD-10-CM

## 2023-10-06 MED ORDER — NUCALA 100 MG/ML ~~LOC~~ SOAJ: 100.0000 mg | SUBCUTANEOUS | 3 refills | Status: DC

## 2023-10-06 NOTE — Patient Instructions (Signed)
 Your next Nucala  dose is due on 11/03/23, 12/01/23, and every 28 days thereafter.   Keep Nucala  in the refrigerator. If necessary, Nucala  can be left out of the refrigerator at room temperature for up to 7 days. Do not use Nucala  if it has been left out of the refrigerator more than 7 days.   If you miss a dose, take it as soon as you remember it.   CONTINUE Trelegy 200-62.5-25 mcg/act 1 puff once daily, azithromycin  500mg  every Mo/We/Fri, montelukast  10mg  daily at bedtime  Your [facility] Their phone number is [phone].   Please call to schedule shipment and confirm address. They will mail your medication to your home.  Your copay should be affordable. If you call the pharmacy and it is not affordable, please double-check that they are billing through your copay card as secondary coverage. That copay card information is: ID: [id number] BIN: [id number] PCN: [id number] Group Number: [id number]  You will need to be seen by your provider in 3 to 4 months to assess how Nucala  is working for you. Keep your  follow-up appointment scheduled on 11/20/23 with Dr. Kara.   How to manage an injection site reaction: Remember the 5 C's: COUNTER - leave on the counter at least 30 minutes but up to overnight to bring medication to room temperature. This may help prevent stinging COLD - place something cold (like an ice gel pack or cold water bottle) on the injection site just before cleansing with alcohol. This may help reduce pain CLARITIN - use Claritin (generic name is loratadine) for the first two weeks of treatment or the day of, the day before, and the day after injecting. This will help to minimize injection site reactions CORTISONE CREAM - apply if injection site is irritated and itching CALL ME - if injection site reaction is bigger than the size of your fist, looks infected, blisters, or if you develop hives

## 2023-10-06 NOTE — Progress Notes (Signed)
 HPI Patient presents today, accompanied by son, to [facility] to see pharmacy team for Nucala  new start.  Past medical history includes chronic hypoxemic respiratory failure, COPD, chronic diastolic heart failure, HTN, CAD and esophageal candidiasis.   Last OV with Dr. Kara was on 08/26/23. At that time, plan was to start biologic given EOS 200 with frequent COPD flares requiring steroids.  Per available prescription dispense history, patient has had 6 courses of prednisone  in the last year.  Today, patient is concerned about anticipated pain of injection and overall low body fat. Her son is with her who will inject the medication.  Respiratory Medications Current regimen: Trelegy 200-62.5-25 mcg/act 1 puff once daily, azithromycin  500mg  every Mo/We/Fri, montelukast  10mg  daily at bedtime  Patient reports no known adherence challenges  OBJECTIVE Allergies  Allergen Reactions   Aspirin  Swelling    nausea and dizziness after taking for one month at a time   Pneumovax [Pneumococcal Polysaccharide Vaccine] Hives and Swelling   Shellfish Allergy Anaphylaxis and Swelling    Medication withdrawal symptoms     Lipitor [Atorvastatin  Calcium ] Other (See Comments)    Recurrent myalgia with lipitor   Ibuprofen Nausea And Vomiting    dizziness   Tdap [Tetanus-Diphth-Acell Pertussis] Swelling and Rash    Outpatient Encounter Medications as of 10/06/2023  Medication Sig   acetaminophen  (TYLENOL ) 500 MG tablet Take 500-1,000 mg by mouth every 6 (six) hours as needed for moderate pain or headache.   albuterol  (PROVENTIL ) (2.5 MG/3ML) 0.083% nebulizer solution Take 3 mLs (2.5 mg total) by nebulization every 6 (six) hours as needed for wheezing or shortness of breath.   albuterol  (VENTOLIN  HFA) 108 (90 Base) MCG/ACT inhaler Inhale 1-2 puffs into the lungs every 6 (six) hours as needed for wheezing or shortness of breath.   apixaban  (ELIQUIS ) 5 MG TABS tablet Take 1 tablet (5 mg total) by  mouth 2 (two) times daily. Needs labs for Eliquis  Refills, please come to office   azithromycin  (ZITHROMAX ) 500 MG tablet Take 1 tablet (500 mg total) by mouth every Monday, Wednesday, and Friday.   Bempedoic Acid  (NEXLETOL ) 180 MG TABS Take 1 tablet (180 mg total) by mouth daily.   CALCIUM  PO Take 1,000 mg by mouth daily.   carvedilol  (COREG ) 25 MG tablet TAKE 1 TABLET (25 MG TOTAL) BY MOUTH DAILY.   Cholecalciferol  (VITAMIN D3 SUPER STRENGTH) 50 MCG (2000 UT) TABS Take 2,000 Units by mouth daily.   ezetimibe  (ZETIA ) 10 MG tablet Take 1 tablet (10 mg total) by mouth daily.   Fluticasone -Umeclidin-Vilant (TRELEGY ELLIPTA ) 100-62.5-25 MCG/ACT AEPB Samole 2   Fluticasone -Umeclidin-Vilant (TRELEGY ELLIPTA ) 200-62.5-25 MCG/ACT AEPB Inhale 1 puff into the lungs daily.   furosemide  (LASIX ) 20 MG tablet TAKE 1 TABLET BY MOUTH EVERY DAY   guaiFENesin  (MUCINEX ) 600 MG 12 hr tablet Take 1 tablet (600 mg total) by mouth 2 (two) times daily as needed for to loosen phlegm.   levothyroxine  (SYNTHROID , LEVOTHROID) 25 MCG tablet Take 25 mcg by mouth daily before breakfast.   montelukast  (SINGULAIR ) 10 MG tablet Take 1 tablet (10 mg total) by mouth at bedtime.   nitroGLYCERIN  (NITRODUR - DOSED IN MG/24 HR) 0.2 mg/hr patch Place 1 patch (0.2 mg total) onto the skin daily.   nitroGLYCERIN  (NITROSTAT ) 0.4 MG SL tablet For chest pain, tightness, or pressure. While sitting, place 1 tablet under tongue. May be used every 5 minutes as needed, for up to 15 minutes. Do not use more than 3 tablets.   OXYGEN   Inhale 2 L into the lungs continuous.   pantoprazole  (PROTONIX ) 40 MG tablet TAKE 1 TABLET (40 MG TOTAL) BY MOUTH TWICE A DAY BEFORE MEALS   sodium chloride  HYPERTONIC 3 % nebulizer solution Take by nebulization as needed for other.   No facility-administered encounter medications on file as of 10/06/2023.     Immunization History  Administered Date(s) Administered   Fluad  Quad(high Dose 65+) 12/07/2020, 11/26/2021    Fluad  Trivalent(High Dose 65+) 01/15/2023   INFLUENZA, HIGH DOSE SEASONAL PF 11/10/2017, 11/05/2018   Influenza Split 12/08/2010, 11/13/2011, 11/06/2016, 11/28/2017, 12/17/2019   Influenza,inj,Quad PF,6+ Mos 10/01/2012, 10/20/2013, 11/03/2014, 12/06/2015   Influenza-Unspecified 09/29/2014   PFIZER(Purple Top)SARS-COV-2 Vaccination 02/19/2019, 03/12/2019, 12/17/2019   Pfizer Covid-19 Vaccine Bivalent Booster 15yrs & up 12/07/2020   Pfizer(Comirnaty )Fall Seasonal Vaccine 12 years and older 11/26/2021   Pneumococcal Polysaccharide-23 06/11/2011   Respiratory Syncytial Virus Vaccine ,Recomb Aduvanted(Arexvy ) 02/11/2022, 01/15/2023   Tdap 06/11/2011   Zoster, Live 06/11/2011     PFTs     No data to display           Eosinophils Most recent blood eosinophil count was 200 cells/microL taken on 07/23/23.   IgE: 28 on 07/22/23  Assessment   Biologics training for mepolizumab  (Nucala )  Goals of therapy: Mechanism of Action: Not fully understood. It does act an interleukin-5 (IL-5) antagonist monoclonal antibody that reduces the production and survival of eosinophils by blocking the binding of IL-5 to the alpha chain of the receptor complex on the eosinophil cell surface. Reviewed that Nucala  is add-on medication and patient must continue maintenance inhaler regimen. Response to therapy: may take 3 months to 6 months to determine efficacy. Discussed that patients generally feel improvement sooner than 3 months.  Side effects: headache (19%), injection site reaction (7-15%), antibody development (6%), backache (5%), fatigue (5%)  Dose: 100 mg subcutaneously every 4 weeks  Administration/Storage:  Reviewed administration sites of thigh or abdomen (at least 2-3 inches away from abdomen). Reviewed the upper arm is only appropriate if caregiver is administering injection  Do not shake the reconstituted solution as this could lead to product foaming or precipitation. Solution should be clear  to opalescent and colorless to pale yellow or pale brown, essentially particle free. Small air bubbles, however, are expected and acceptable. If particulate matter remains in the solution or if the solution appears cloudy or milky, discard the solution.  Reviewed storage of medication in refrigerator. Reviewed that Nucala  can be stored at room temperature in unopened carton for up to 7 days.  Access: Approval of Nucala  through: insurance Patient enrolled into copay card program to help with copay assistance. This information was provided to the patient.  ID: [id number] Group: [id number] Rx Bin: [id number] PCN: [id number]  Patient's son administered Nucala  100mg /mL in right lower abdomen using sample  Nucala  100mg /mL autoinjector pen NDC: [phone] Lot: [id number] Expiration: 2027-JUN  Patient monitored for 30 minutes for adverse reaction.  Patient tolerated well.  Injection site noted. No swelling or redness noted.  Medication Reconciliation  A drug regimen assessment was performed, including review of allergies, interactions, disease-state management, dosing and immunization history. Medications were reviewed with the patient, including name, instructions, indication, goals of therapy, potential side effects, importance of adherence, and safe use.  Drug interaction(s): none noted  PLAN Continue Nucala  100mg  every 4 weeks. Next dose is due 11/03/23 and every 4 weeks thereafter. Rx sent to: [facility]: [phone]. Patient provided with pharmacy phone number and advised to call later this week to schedule  shipment to home. Patient provided with copay card information to provide to pharmacy if quoted copay exceeds $5 per month. Continue maintenance asthma regimen of: Trelegy 200-62.5-25 mcg/act 1 puff once daily, azithromycin  500mg  every Mo/We/Fri, montelukast  10mg  daily at bedtime Follow-up with Dr. Kara on 11/20/23 as scheduled.  All questions encouraged and answered.  Instructed  patient to reach out with any further questions or concerns.  Thank you for allowing pharmacy to participate in this patient's care.  This appointment required 45 minutes of patient care (this includes precharting, chart review, review of results, face-to-face care, etc.).

## 2023-10-16 ENCOUNTER — Other Ambulatory Visit: Payer: Self-pay | Admitting: Pulmonary Disease

## 2023-10-16 DIAGNOSIS — J449 Chronic obstructive pulmonary disease, unspecified: Secondary | ICD-10-CM

## 2023-11-03 ENCOUNTER — Telehealth: Payer: Self-pay

## 2023-11-03 NOTE — Telephone Encounter (Signed)
 Copied from CRM [id number]. Topic: Clinical - Prescription Issue >> Nov 03, 2023 12:30 PM [name] wrote: Reason for CRM: Pt called stated she is suppose to start her Dupixent today but she has not rec'd the medicine as of today.  From previous encounter, looks like pt was switched from Dupixent to Nucala .  Please advise.

## 2023-11-04 MED ORDER — NUCALA 100 MG/ML ~~LOC~~ SOAJ: 100.0000 mg | SUBCUTANEOUS | 3 refills | Status: DC

## 2023-11-04 NOTE — Telephone Encounter (Signed)
 Rx triaged to [facility] with address: [address]   Called patient to discuss - she missed dose yesterday. Instructed patient to take her next dose as soon as it arrives in the mail. Missed dose instructions - take missed dose as soon as you are able. If the next dose is due, skip the missed dose and continue with usual schedule.  Called [facility] - they had not received e-Rx. Provided verbal order. Nucala  100mg  [place] autoinjector pen (100mg /mL).  SIG: Inject 1mL (100mg  total) into the skin every 28 (twenty-eight) days.  Qty: 1 mL, RF: 3 Prescriber: Dorn Chill  Per pharmacist at [facility], okay to e-prescribe to any of the [facility] pharmacy locations listed. Unclear why original Rx at time of new start Nucala  visit was not received.   [facility] will reach out to patient to confirm insurance per pharmacist.   Aleck Puls, PharmD, BCPS, CPP Clinical Pharmacist  [facility]

## 2023-11-04 NOTE — Addendum Note (Signed)
 Addended by: See Beharry L on: 11/04/2023 08:39 AM   Modules accepted: Orders

## 2023-11-04 NOTE — Telephone Encounter (Addendum)
 Called [facility] and they stated they never received the rx. They provided me to correct address to e-scribe [address] Passed along to [facility] to resend rx

## 2023-11-04 NOTE — Telephone Encounter (Signed)
 Refill sent for NUCALA  to [facility]: [phone]  Dose: 100mg  [place] every 28 days   New start Nucala  in clinic 10/06/23 and Rx was sent to [facility]. Received notification from pharmacy that they did not receive Rx and patient was due for dose yesterday.   Re-sent Rx to [facility].   Last OV: 08/26/23 Provider: Kara Next OV: 11/20/23  Aleck Puls, PharmD, BCPS Clinical Pharmacist  [facility]

## 2023-11-06 ENCOUNTER — Other Ambulatory Visit (HOSPITAL_COMMUNITY): Payer: Self-pay

## 2023-11-07 ENCOUNTER — Telehealth: Payer: Self-pay

## 2023-11-07 ENCOUNTER — Other Ambulatory Visit (HOSPITAL_COMMUNITY): Payer: Self-pay

## 2023-11-07 NOTE — Telephone Encounter (Signed)
 Received a call back from the pt who stated her insurance should be active again as of today. Eligibility check still did not return results but it may take some time to get the system updated. I advised her I will contact [facility] on Monday and see if they're able to process the claim.

## 2023-11-07 NOTE — Telephone Encounter (Signed)
 Received fax from [facility] stating Nucala  requires pa. Ran test claim and pt's insurance is currently terminated. Reached out to the pt who stated she is aware and needed to submit a form to her insurance to get it reactivated. She gave them the form and is waiting to hear a response. She says she will call them today and check the status. I advised her to let me know what they tell her and if it will not be resolved today we will give her another sample on Monday.

## 2023-11-10 ENCOUNTER — Other Ambulatory Visit (HOSPITAL_COMMUNITY): Payer: Self-pay

## 2023-11-10 NOTE — Telephone Encounter (Signed)
 Reached out to [facility] and they were able to reprocess the claim. The pt has a $40 copay. Called the pt to notify her, I advised her to sign up for a copay card. Gave her the phone number for Nucala  [phone] since she is not tech savvy. Advised her to reach out to me if she has any issues.

## 2023-11-11 NOTE — Telephone Encounter (Signed)
 Received a call back from the patient, she stated she was able to get enrolled in the copay card and [facility] on Friday. I confirmed with her if she takes it on Friday she will take her next dose 28 days after on 11/14. She wanted to confirm she will always take it on a Friday now instead of Monday. I told her yes unless she decides to take it on Monday. She verbalized her understanding and did not have any additional questions.

## 2023-11-20 ENCOUNTER — Encounter: Payer: Self-pay | Admitting: Pulmonary Disease

## 2023-11-20 ENCOUNTER — Ambulatory Visit: Admitting: Pulmonary Disease

## 2023-11-20 VITALS — BP 97/59 | HR 64 | Ht 64.0 in | Wt 85.0 lb

## 2023-11-20 DIAGNOSIS — E890 Postprocedural hypothyroidism: Secondary | ICD-10-CM | POA: Diagnosis not present

## 2023-11-20 DIAGNOSIS — J9611 Chronic respiratory failure with hypoxia: Secondary | ICD-10-CM | POA: Diagnosis not present

## 2023-11-20 DIAGNOSIS — M810 Age-related osteoporosis without current pathological fracture: Secondary | ICD-10-CM | POA: Diagnosis not present

## 2023-11-20 DIAGNOSIS — Z0000 Encounter for general adult medical examination without abnormal findings: Secondary | ICD-10-CM | POA: Diagnosis not present

## 2023-11-20 DIAGNOSIS — I5022 Chronic systolic (congestive) heart failure: Secondary | ICD-10-CM | POA: Diagnosis not present

## 2023-11-20 DIAGNOSIS — J449 Chronic obstructive pulmonary disease, unspecified: Secondary | ICD-10-CM

## 2023-11-20 DIAGNOSIS — I10 Essential (primary) hypertension: Secondary | ICD-10-CM | POA: Diagnosis not present

## 2023-11-20 DIAGNOSIS — I255 Ischemic cardiomyopathy: Secondary | ICD-10-CM | POA: Diagnosis not present

## 2023-11-20 DIAGNOSIS — Z1331 Encounter for screening for depression: Secondary | ICD-10-CM | POA: Diagnosis not present

## 2023-11-20 DIAGNOSIS — R636 Underweight: Secondary | ICD-10-CM | POA: Diagnosis not present

## 2023-11-20 DIAGNOSIS — E46 Unspecified protein-calorie malnutrition: Secondary | ICD-10-CM | POA: Diagnosis not present

## 2023-11-20 NOTE — Patient Instructions (Signed)
 Continue Nucala  injections  Continue trelegy 200mcg 1 puff daily - rinse mouth out after each use  Use albuterol  inhaler 1-2 puffs every 4-6 hours as needed  Continue montelukast  10mg  daily  Continue azithromycin  1 tab, 3 days per week  Continue supplemental oxygen  to maintain O2 saturations 88% or higher  Follow up in 4 months, call sooner if needed
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
## 2023-11-21 ENCOUNTER — Encounter: Payer: Self-pay | Admitting: Pulmonary Disease

## 2023-11-29 DIAGNOSIS — I5020 Unspecified systolic (congestive) heart failure: Secondary | ICD-10-CM | POA: Diagnosis not present

## 2023-11-29 DIAGNOSIS — R41841 Cognitive communication deficit: Secondary | ICD-10-CM | POA: Diagnosis not present

## 2023-11-29 DIAGNOSIS — J441 Chronic obstructive pulmonary disease with (acute) exacerbation: Secondary | ICD-10-CM | POA: Diagnosis present

## 2023-11-29 DIAGNOSIS — I110 Hypertensive heart disease with heart failure: Secondary | ICD-10-CM | POA: Diagnosis not present

## 2023-11-29 DIAGNOSIS — Z743 Need for continuous supervision: Secondary | ICD-10-CM | POA: Diagnosis not present

## 2023-11-29 DIAGNOSIS — I5189 Other ill-defined heart diseases: Secondary | ICD-10-CM | POA: Diagnosis not present

## 2023-11-29 DIAGNOSIS — Z7901 Long term (current) use of anticoagulants: Secondary | ICD-10-CM | POA: Diagnosis not present

## 2023-11-29 DIAGNOSIS — D509 Iron deficiency anemia, unspecified: Secondary | ICD-10-CM | POA: Diagnosis not present

## 2023-11-29 DIAGNOSIS — J690 Pneumonitis due to inhalation of food and vomit: Secondary | ICD-10-CM | POA: Diagnosis present

## 2023-11-29 DIAGNOSIS — I491 Atrial premature depolarization: Secondary | ICD-10-CM | POA: Diagnosis not present

## 2023-11-29 DIAGNOSIS — R4182 Altered mental status, unspecified: Secondary | ICD-10-CM | POA: Diagnosis not present

## 2023-11-29 DIAGNOSIS — R339 Retention of urine, unspecified: Secondary | ICD-10-CM | POA: Diagnosis not present

## 2023-11-29 DIAGNOSIS — J9610 Chronic respiratory failure, unspecified whether with hypoxia or hypercapnia: Secondary | ICD-10-CM | POA: Diagnosis not present

## 2023-11-29 DIAGNOSIS — J9601 Acute respiratory failure with hypoxia: Secondary | ICD-10-CM | POA: Diagnosis not present

## 2023-11-29 DIAGNOSIS — I714 Abdominal aortic aneurysm, without rupture, unspecified: Secondary | ICD-10-CM | POA: Diagnosis not present

## 2023-11-29 DIAGNOSIS — D649 Anemia, unspecified: Secondary | ICD-10-CM | POA: Diagnosis not present

## 2023-11-29 DIAGNOSIS — I739 Peripheral vascular disease, unspecified: Secondary | ICD-10-CM | POA: Diagnosis present

## 2023-11-29 DIAGNOSIS — K222 Esophageal obstruction: Secondary | ICD-10-CM | POA: Diagnosis present

## 2023-11-29 DIAGNOSIS — I429 Cardiomyopathy, unspecified: Secondary | ICD-10-CM | POA: Diagnosis not present

## 2023-11-29 DIAGNOSIS — I083 Combined rheumatic disorders of mitral, aortic and tricuspid valves: Secondary | ICD-10-CM | POA: Diagnosis not present

## 2023-11-29 DIAGNOSIS — E785 Hyperlipidemia, unspecified: Secondary | ICD-10-CM | POA: Diagnosis not present

## 2023-11-29 DIAGNOSIS — E8729 Other acidosis: Secondary | ICD-10-CM | POA: Diagnosis present

## 2023-11-29 DIAGNOSIS — E039 Hypothyroidism, unspecified: Secondary | ICD-10-CM | POA: Diagnosis present

## 2023-11-29 DIAGNOSIS — I130 Hypertensive heart and chronic kidney disease with heart failure and stage 1 through stage 4 chronic kidney disease, or unspecified chronic kidney disease: Secondary | ICD-10-CM | POA: Diagnosis present

## 2023-11-29 DIAGNOSIS — L89152 Pressure ulcer of sacral region, stage 2: Secondary | ICD-10-CM | POA: Diagnosis not present

## 2023-11-29 DIAGNOSIS — Z8719 Personal history of other diseases of the digestive system: Secondary | ICD-10-CM | POA: Diagnosis not present

## 2023-11-29 DIAGNOSIS — I445 Left posterior fascicular block: Secondary | ICD-10-CM | POA: Diagnosis not present

## 2023-11-29 DIAGNOSIS — R2681 Unsteadiness on feet: Secondary | ICD-10-CM | POA: Diagnosis not present

## 2023-11-29 DIAGNOSIS — R1312 Dysphagia, oropharyngeal phase: Secondary | ICD-10-CM | POA: Diagnosis not present

## 2023-11-29 DIAGNOSIS — M6281 Muscle weakness (generalized): Secondary | ICD-10-CM | POA: Diagnosis not present

## 2023-11-29 DIAGNOSIS — I272 Pulmonary hypertension, unspecified: Secondary | ICD-10-CM | POA: Diagnosis present

## 2023-11-29 DIAGNOSIS — R0602 Shortness of breath: Secondary | ICD-10-CM | POA: Diagnosis not present

## 2023-11-29 DIAGNOSIS — I498 Other specified cardiac arrhythmias: Secondary | ICD-10-CM | POA: Diagnosis not present

## 2023-11-29 DIAGNOSIS — J9602 Acute respiratory failure with hypercapnia: Secondary | ICD-10-CM | POA: Diagnosis not present

## 2023-11-29 DIAGNOSIS — E871 Hypo-osmolality and hyponatremia: Secondary | ICD-10-CM | POA: Diagnosis not present

## 2023-11-29 DIAGNOSIS — I2510 Atherosclerotic heart disease of native coronary artery without angina pectoris: Secondary | ICD-10-CM | POA: Diagnosis not present

## 2023-11-29 DIAGNOSIS — N319 Neuromuscular dysfunction of bladder, unspecified: Secondary | ICD-10-CM | POA: Diagnosis not present

## 2023-11-29 DIAGNOSIS — N189 Chronic kidney disease, unspecified: Secondary | ICD-10-CM | POA: Diagnosis not present

## 2023-11-29 DIAGNOSIS — I5023 Acute on chronic systolic (congestive) heart failure: Secondary | ICD-10-CM | POA: Diagnosis present

## 2023-11-29 DIAGNOSIS — Z7409 Other reduced mobility: Secondary | ICD-10-CM | POA: Diagnosis not present

## 2023-11-29 DIAGNOSIS — Z20822 Contact with and (suspected) exposure to covid-19: Secondary | ICD-10-CM | POA: Diagnosis not present

## 2023-11-29 DIAGNOSIS — I480 Paroxysmal atrial fibrillation: Secondary | ICD-10-CM | POA: Diagnosis present

## 2023-11-29 DIAGNOSIS — D631 Anemia in chronic kidney disease: Secondary | ICD-10-CM | POA: Diagnosis present

## 2023-11-29 DIAGNOSIS — I350 Nonrheumatic aortic (valve) stenosis: Secondary | ICD-10-CM | POA: Diagnosis present

## 2023-11-29 DIAGNOSIS — J449 Chronic obstructive pulmonary disease, unspecified: Secondary | ICD-10-CM | POA: Diagnosis not present

## 2023-11-29 DIAGNOSIS — I5082 Biventricular heart failure: Secondary | ICD-10-CM | POA: Diagnosis present

## 2023-11-29 DIAGNOSIS — N183 Chronic kidney disease, stage 3 unspecified: Secondary | ICD-10-CM | POA: Diagnosis present

## 2023-11-29 DIAGNOSIS — Z7189 Other specified counseling: Secondary | ICD-10-CM | POA: Diagnosis not present

## 2023-11-29 DIAGNOSIS — R1310 Dysphagia, unspecified: Secondary | ICD-10-CM | POA: Diagnosis not present

## 2023-11-29 DIAGNOSIS — Z681 Body mass index (BMI) 19 or less, adult: Secondary | ICD-10-CM | POA: Diagnosis not present

## 2023-11-29 DIAGNOSIS — E43 Unspecified severe protein-calorie malnutrition: Secondary | ICD-10-CM | POA: Diagnosis present

## 2023-11-29 DIAGNOSIS — R918 Other nonspecific abnormal finding of lung field: Secondary | ICD-10-CM | POA: Diagnosis not present

## 2023-11-29 DIAGNOSIS — R1319 Other dysphagia: Secondary | ICD-10-CM | POA: Diagnosis not present

## 2023-11-29 DIAGNOSIS — J432 Centrilobular emphysema: Secondary | ICD-10-CM | POA: Diagnosis not present

## 2023-11-29 DIAGNOSIS — R000 Tachycardia, unspecified: Secondary | ICD-10-CM | POA: Diagnosis not present

## 2023-11-29 DIAGNOSIS — R079 Chest pain, unspecified: Secondary | ICD-10-CM | POA: Diagnosis not present

## 2023-11-29 DIAGNOSIS — J439 Emphysema, unspecified: Secondary | ICD-10-CM | POA: Diagnosis present

## 2023-11-29 DIAGNOSIS — J9622 Acute and chronic respiratory failure with hypercapnia: Secondary | ICD-10-CM | POA: Diagnosis present

## 2023-11-29 DIAGNOSIS — K219 Gastro-esophageal reflux disease without esophagitis: Secondary | ICD-10-CM | POA: Diagnosis not present

## 2023-11-29 DIAGNOSIS — I509 Heart failure, unspecified: Secondary | ICD-10-CM | POA: Diagnosis not present

## 2023-11-29 DIAGNOSIS — I878 Other specified disorders of veins: Secondary | ICD-10-CM | POA: Diagnosis not present

## 2023-11-29 DIAGNOSIS — R64 Cachexia: Secondary | ICD-10-CM | POA: Diagnosis present

## 2023-11-29 DIAGNOSIS — I517 Cardiomegaly: Secondary | ICD-10-CM | POA: Diagnosis not present

## 2023-11-29 DIAGNOSIS — J479 Bronchiectasis, uncomplicated: Secondary | ICD-10-CM | POA: Diagnosis present

## 2023-11-29 DIAGNOSIS — J9621 Acute and chronic respiratory failure with hypoxia: Secondary | ICD-10-CM | POA: Diagnosis present

## 2023-11-29 DIAGNOSIS — I501 Left ventricular failure: Secondary | ICD-10-CM | POA: Diagnosis not present

## 2023-11-29 DIAGNOSIS — R059 Cough, unspecified: Secondary | ICD-10-CM | POA: Diagnosis not present

## 2023-11-30 DIAGNOSIS — R918 Other nonspecific abnormal finding of lung field: Secondary | ICD-10-CM | POA: Diagnosis not present

## 2023-11-30 DIAGNOSIS — J432 Centrilobular emphysema: Secondary | ICD-10-CM | POA: Diagnosis not present

## 2023-11-30 DIAGNOSIS — J9601 Acute respiratory failure with hypoxia: Secondary | ICD-10-CM | POA: Diagnosis not present

## 2023-11-30 DIAGNOSIS — J449 Chronic obstructive pulmonary disease, unspecified: Secondary | ICD-10-CM | POA: Diagnosis not present

## 2023-11-30 DIAGNOSIS — I501 Left ventricular failure: Secondary | ICD-10-CM | POA: Diagnosis not present

## 2023-12-01 DIAGNOSIS — J9601 Acute respiratory failure with hypoxia: Secondary | ICD-10-CM | POA: Diagnosis not present

## 2023-12-01 DIAGNOSIS — Z8719 Personal history of other diseases of the digestive system: Secondary | ICD-10-CM | POA: Diagnosis not present

## 2023-12-01 DIAGNOSIS — I083 Combined rheumatic disorders of mitral, aortic and tricuspid valves: Secondary | ICD-10-CM | POA: Diagnosis not present

## 2023-12-01 DIAGNOSIS — Z7901 Long term (current) use of anticoagulants: Secondary | ICD-10-CM | POA: Diagnosis not present

## 2023-12-01 DIAGNOSIS — I501 Left ventricular failure: Secondary | ICD-10-CM | POA: Diagnosis not present

## 2023-12-01 DIAGNOSIS — I5189 Other ill-defined heart diseases: Secondary | ICD-10-CM | POA: Diagnosis not present

## 2023-12-01 DIAGNOSIS — I491 Atrial premature depolarization: Secondary | ICD-10-CM | POA: Diagnosis not present

## 2023-12-01 DIAGNOSIS — J449 Chronic obstructive pulmonary disease, unspecified: Secondary | ICD-10-CM | POA: Diagnosis not present

## 2023-12-01 DIAGNOSIS — R1319 Other dysphagia: Secondary | ICD-10-CM | POA: Diagnosis not present

## 2023-12-01 DIAGNOSIS — I878 Other specified disorders of veins: Secondary | ICD-10-CM | POA: Diagnosis not present

## 2023-12-01 DIAGNOSIS — I272 Pulmonary hypertension, unspecified: Secondary | ICD-10-CM | POA: Diagnosis not present

## 2023-12-01 DIAGNOSIS — I517 Cardiomegaly: Secondary | ICD-10-CM | POA: Diagnosis not present

## 2023-12-01 DIAGNOSIS — I509 Heart failure, unspecified: Secondary | ICD-10-CM | POA: Diagnosis not present

## 2023-12-02 DIAGNOSIS — Z7409 Other reduced mobility: Secondary | ICD-10-CM | POA: Diagnosis not present

## 2023-12-02 DIAGNOSIS — I130 Hypertensive heart and chronic kidney disease with heart failure and stage 1 through stage 4 chronic kidney disease, or unspecified chronic kidney disease: Secondary | ICD-10-CM | POA: Diagnosis not present

## 2023-12-02 DIAGNOSIS — I739 Peripheral vascular disease, unspecified: Secondary | ICD-10-CM | POA: Diagnosis not present

## 2023-12-02 DIAGNOSIS — I350 Nonrheumatic aortic (valve) stenosis: Secondary | ICD-10-CM | POA: Diagnosis not present

## 2023-12-02 DIAGNOSIS — Z7189 Other specified counseling: Secondary | ICD-10-CM | POA: Diagnosis not present

## 2023-12-02 DIAGNOSIS — J9621 Acute and chronic respiratory failure with hypoxia: Secondary | ICD-10-CM | POA: Diagnosis not present

## 2023-12-02 DIAGNOSIS — E785 Hyperlipidemia, unspecified: Secondary | ICD-10-CM | POA: Diagnosis not present

## 2023-12-02 DIAGNOSIS — J441 Chronic obstructive pulmonary disease with (acute) exacerbation: Secondary | ICD-10-CM | POA: Diagnosis not present

## 2023-12-02 DIAGNOSIS — I480 Paroxysmal atrial fibrillation: Secondary | ICD-10-CM | POA: Diagnosis not present

## 2023-12-02 DIAGNOSIS — R1312 Dysphagia, oropharyngeal phase: Secondary | ICD-10-CM | POA: Diagnosis not present

## 2023-12-02 DIAGNOSIS — I2510 Atherosclerotic heart disease of native coronary artery without angina pectoris: Secondary | ICD-10-CM | POA: Diagnosis not present

## 2023-12-02 DIAGNOSIS — I272 Pulmonary hypertension, unspecified: Secondary | ICD-10-CM | POA: Diagnosis not present

## 2023-12-02 DIAGNOSIS — N189 Chronic kidney disease, unspecified: Secondary | ICD-10-CM | POA: Diagnosis not present

## 2023-12-02 DIAGNOSIS — D509 Iron deficiency anemia, unspecified: Secondary | ICD-10-CM | POA: Diagnosis not present

## 2023-12-02 DIAGNOSIS — I5023 Acute on chronic systolic (congestive) heart failure: Secondary | ICD-10-CM | POA: Diagnosis not present

## 2023-12-02 DIAGNOSIS — J9601 Acute respiratory failure with hypoxia: Secondary | ICD-10-CM | POA: Diagnosis not present

## 2023-12-03 DIAGNOSIS — I739 Peripheral vascular disease, unspecified: Secondary | ICD-10-CM | POA: Diagnosis not present

## 2023-12-03 DIAGNOSIS — Z7189 Other specified counseling: Secondary | ICD-10-CM | POA: Diagnosis not present

## 2023-12-03 DIAGNOSIS — I130 Hypertensive heart and chronic kidney disease with heart failure and stage 1 through stage 4 chronic kidney disease, or unspecified chronic kidney disease: Secondary | ICD-10-CM | POA: Diagnosis not present

## 2023-12-03 DIAGNOSIS — I480 Paroxysmal atrial fibrillation: Secondary | ICD-10-CM | POA: Diagnosis not present

## 2023-12-03 DIAGNOSIS — Z7409 Other reduced mobility: Secondary | ICD-10-CM | POA: Diagnosis not present

## 2023-12-03 DIAGNOSIS — I350 Nonrheumatic aortic (valve) stenosis: Secondary | ICD-10-CM | POA: Diagnosis not present

## 2023-12-03 DIAGNOSIS — J441 Chronic obstructive pulmonary disease with (acute) exacerbation: Secondary | ICD-10-CM | POA: Diagnosis not present

## 2023-12-03 DIAGNOSIS — J9601 Acute respiratory failure with hypoxia: Secondary | ICD-10-CM | POA: Diagnosis not present

## 2023-12-03 DIAGNOSIS — J9621 Acute and chronic respiratory failure with hypoxia: Secondary | ICD-10-CM | POA: Diagnosis not present

## 2023-12-03 DIAGNOSIS — I272 Pulmonary hypertension, unspecified: Secondary | ICD-10-CM | POA: Diagnosis not present

## 2023-12-03 DIAGNOSIS — I5023 Acute on chronic systolic (congestive) heart failure: Secondary | ICD-10-CM | POA: Diagnosis not present

## 2023-12-03 DIAGNOSIS — N189 Chronic kidney disease, unspecified: Secondary | ICD-10-CM | POA: Diagnosis not present

## 2023-12-03 DIAGNOSIS — R1312 Dysphagia, oropharyngeal phase: Secondary | ICD-10-CM | POA: Diagnosis not present

## 2023-12-03 DIAGNOSIS — E785 Hyperlipidemia, unspecified: Secondary | ICD-10-CM | POA: Diagnosis not present

## 2023-12-03 DIAGNOSIS — I2510 Atherosclerotic heart disease of native coronary artery without angina pectoris: Secondary | ICD-10-CM | POA: Diagnosis not present

## 2023-12-03 DIAGNOSIS — D509 Iron deficiency anemia, unspecified: Secondary | ICD-10-CM | POA: Diagnosis not present

## 2023-12-04 DIAGNOSIS — J9601 Acute respiratory failure with hypoxia: Secondary | ICD-10-CM | POA: Diagnosis not present

## 2023-12-04 DIAGNOSIS — R1312 Dysphagia, oropharyngeal phase: Secondary | ICD-10-CM | POA: Diagnosis not present

## 2023-12-04 DIAGNOSIS — Z7409 Other reduced mobility: Secondary | ICD-10-CM | POA: Diagnosis not present

## 2023-12-04 DIAGNOSIS — Z7189 Other specified counseling: Secondary | ICD-10-CM | POA: Diagnosis not present

## 2023-12-04 DIAGNOSIS — J441 Chronic obstructive pulmonary disease with (acute) exacerbation: Secondary | ICD-10-CM | POA: Diagnosis not present

## 2023-12-05 DIAGNOSIS — J441 Chronic obstructive pulmonary disease with (acute) exacerbation: Secondary | ICD-10-CM | POA: Diagnosis not present

## 2023-12-05 DIAGNOSIS — Z7409 Other reduced mobility: Secondary | ICD-10-CM | POA: Diagnosis not present

## 2023-12-05 DIAGNOSIS — I445 Left posterior fascicular block: Secondary | ICD-10-CM | POA: Diagnosis not present

## 2023-12-05 DIAGNOSIS — Z7189 Other specified counseling: Secondary | ICD-10-CM | POA: Diagnosis not present

## 2023-12-05 DIAGNOSIS — J9601 Acute respiratory failure with hypoxia: Secondary | ICD-10-CM | POA: Diagnosis not present

## 2023-12-05 DIAGNOSIS — R1312 Dysphagia, oropharyngeal phase: Secondary | ICD-10-CM | POA: Diagnosis not present

## 2023-12-06 DIAGNOSIS — J9601 Acute respiratory failure with hypoxia: Secondary | ICD-10-CM | POA: Diagnosis not present

## 2023-12-06 DIAGNOSIS — Z7189 Other specified counseling: Secondary | ICD-10-CM | POA: Diagnosis not present

## 2023-12-06 DIAGNOSIS — J441 Chronic obstructive pulmonary disease with (acute) exacerbation: Secondary | ICD-10-CM | POA: Diagnosis not present

## 2023-12-06 DIAGNOSIS — Z7409 Other reduced mobility: Secondary | ICD-10-CM | POA: Diagnosis not present

## 2023-12-06 DIAGNOSIS — R1312 Dysphagia, oropharyngeal phase: Secondary | ICD-10-CM | POA: Diagnosis not present

## 2023-12-07 DIAGNOSIS — Z7409 Other reduced mobility: Secondary | ICD-10-CM | POA: Diagnosis not present

## 2023-12-07 DIAGNOSIS — Z7189 Other specified counseling: Secondary | ICD-10-CM | POA: Diagnosis not present

## 2023-12-07 DIAGNOSIS — R1312 Dysphagia, oropharyngeal phase: Secondary | ICD-10-CM | POA: Diagnosis not present

## 2023-12-07 DIAGNOSIS — J441 Chronic obstructive pulmonary disease with (acute) exacerbation: Secondary | ICD-10-CM | POA: Diagnosis not present

## 2023-12-07 DIAGNOSIS — J9601 Acute respiratory failure with hypoxia: Secondary | ICD-10-CM | POA: Diagnosis not present

## 2023-12-08 DIAGNOSIS — J9601 Acute respiratory failure with hypoxia: Secondary | ICD-10-CM | POA: Diagnosis not present

## 2023-12-08 DIAGNOSIS — Z7409 Other reduced mobility: Secondary | ICD-10-CM | POA: Diagnosis not present

## 2023-12-08 DIAGNOSIS — R1312 Dysphagia, oropharyngeal phase: Secondary | ICD-10-CM | POA: Diagnosis not present

## 2023-12-08 DIAGNOSIS — J441 Chronic obstructive pulmonary disease with (acute) exacerbation: Secondary | ICD-10-CM | POA: Diagnosis not present

## 2023-12-08 DIAGNOSIS — Z7189 Other specified counseling: Secondary | ICD-10-CM | POA: Diagnosis not present

## 2023-12-09 DIAGNOSIS — J9601 Acute respiratory failure with hypoxia: Secondary | ICD-10-CM | POA: Diagnosis not present

## 2023-12-10 DIAGNOSIS — J9601 Acute respiratory failure with hypoxia: Secondary | ICD-10-CM | POA: Diagnosis not present

## 2023-12-10 DIAGNOSIS — R000 Tachycardia, unspecified: Secondary | ICD-10-CM | POA: Diagnosis not present

## 2023-12-10 DIAGNOSIS — J9602 Acute respiratory failure with hypercapnia: Secondary | ICD-10-CM | POA: Diagnosis not present

## 2023-12-11 DIAGNOSIS — M6281 Muscle weakness (generalized): Secondary | ICD-10-CM | POA: Diagnosis not present

## 2023-12-11 DIAGNOSIS — E039 Hypothyroidism, unspecified: Secondary | ICD-10-CM | POA: Diagnosis not present

## 2023-12-11 DIAGNOSIS — J441 Chronic obstructive pulmonary disease with (acute) exacerbation: Secondary | ICD-10-CM | POA: Diagnosis not present

## 2023-12-11 DIAGNOSIS — E871 Hypo-osmolality and hyponatremia: Secondary | ICD-10-CM | POA: Diagnosis not present

## 2023-12-11 DIAGNOSIS — J449 Chronic obstructive pulmonary disease, unspecified: Secondary | ICD-10-CM | POA: Diagnosis not present

## 2023-12-11 DIAGNOSIS — K222 Esophageal obstruction: Secondary | ICD-10-CM | POA: Diagnosis not present

## 2023-12-11 DIAGNOSIS — E46 Unspecified protein-calorie malnutrition: Secondary | ICD-10-CM | POA: Diagnosis not present

## 2023-12-11 DIAGNOSIS — Z789 Other specified health status: Secondary | ICD-10-CM | POA: Diagnosis not present

## 2023-12-11 DIAGNOSIS — I714 Abdominal aortic aneurysm, without rupture, unspecified: Secondary | ICD-10-CM | POA: Diagnosis not present

## 2023-12-11 DIAGNOSIS — I739 Peripheral vascular disease, unspecified: Secondary | ICD-10-CM | POA: Diagnosis not present

## 2023-12-11 DIAGNOSIS — Z743 Need for continuous supervision: Secondary | ICD-10-CM | POA: Diagnosis not present

## 2023-12-11 DIAGNOSIS — R627 Adult failure to thrive: Secondary | ICD-10-CM | POA: Diagnosis not present

## 2023-12-11 DIAGNOSIS — I5022 Chronic systolic (congestive) heart failure: Secondary | ICD-10-CM | POA: Diagnosis not present

## 2023-12-11 DIAGNOSIS — Z681 Body mass index (BMI) 19 or less, adult: Secondary | ICD-10-CM | POA: Diagnosis not present

## 2023-12-11 DIAGNOSIS — R41841 Cognitive communication deficit: Secondary | ICD-10-CM | POA: Diagnosis not present

## 2023-12-11 DIAGNOSIS — D509 Iron deficiency anemia, unspecified: Secondary | ICD-10-CM | POA: Diagnosis not present

## 2023-12-11 DIAGNOSIS — J9601 Acute respiratory failure with hypoxia: Secondary | ICD-10-CM | POA: Diagnosis not present

## 2023-12-11 DIAGNOSIS — L89152 Pressure ulcer of sacral region, stage 2: Secondary | ICD-10-CM | POA: Diagnosis not present

## 2023-12-11 DIAGNOSIS — R2241 Localized swelling, mass and lump, right lower limb: Secondary | ICD-10-CM | POA: Diagnosis not present

## 2023-12-11 DIAGNOSIS — K219 Gastro-esophageal reflux disease without esophagitis: Secondary | ICD-10-CM | POA: Diagnosis not present

## 2023-12-11 DIAGNOSIS — Z7409 Other reduced mobility: Secondary | ICD-10-CM | POA: Diagnosis not present

## 2023-12-11 DIAGNOSIS — I5020 Unspecified systolic (congestive) heart failure: Secondary | ICD-10-CM | POA: Diagnosis not present

## 2023-12-11 DIAGNOSIS — I480 Paroxysmal atrial fibrillation: Secondary | ICD-10-CM | POA: Diagnosis not present

## 2023-12-11 DIAGNOSIS — I2510 Atherosclerotic heart disease of native coronary artery without angina pectoris: Secondary | ICD-10-CM | POA: Diagnosis not present

## 2023-12-11 DIAGNOSIS — D649 Anemia, unspecified: Secondary | ICD-10-CM | POA: Diagnosis not present

## 2023-12-11 DIAGNOSIS — R1310 Dysphagia, unspecified: Secondary | ICD-10-CM | POA: Diagnosis not present

## 2023-12-11 DIAGNOSIS — J9622 Acute and chronic respiratory failure with hypercapnia: Secondary | ICD-10-CM | POA: Diagnosis not present

## 2023-12-11 DIAGNOSIS — Z7901 Long term (current) use of anticoagulants: Secondary | ICD-10-CM | POA: Diagnosis not present

## 2023-12-11 DIAGNOSIS — R4182 Altered mental status, unspecified: Secondary | ICD-10-CM | POA: Diagnosis not present

## 2023-12-11 DIAGNOSIS — R2242 Localized swelling, mass and lump, left lower limb: Secondary | ICD-10-CM | POA: Diagnosis not present

## 2023-12-11 DIAGNOSIS — I429 Cardiomyopathy, unspecified: Secondary | ICD-10-CM | POA: Diagnosis not present

## 2023-12-11 DIAGNOSIS — J9610 Chronic respiratory failure, unspecified whether with hypoxia or hypercapnia: Secondary | ICD-10-CM | POA: Diagnosis not present

## 2023-12-11 DIAGNOSIS — I130 Hypertensive heart and chronic kidney disease with heart failure and stage 1 through stage 4 chronic kidney disease, or unspecified chronic kidney disease: Secondary | ICD-10-CM | POA: Diagnosis not present

## 2023-12-11 DIAGNOSIS — J9611 Chronic respiratory failure with hypoxia: Secondary | ICD-10-CM | POA: Diagnosis not present

## 2023-12-11 DIAGNOSIS — J9621 Acute and chronic respiratory failure with hypoxia: Secondary | ICD-10-CM | POA: Diagnosis not present

## 2023-12-11 DIAGNOSIS — N319 Neuromuscular dysfunction of bladder, unspecified: Secondary | ICD-10-CM | POA: Diagnosis not present

## 2023-12-11 DIAGNOSIS — R64 Cachexia: Secondary | ICD-10-CM | POA: Diagnosis not present

## 2023-12-11 DIAGNOSIS — R1312 Dysphagia, oropharyngeal phase: Secondary | ICD-10-CM | POA: Diagnosis not present

## 2023-12-11 DIAGNOSIS — R339 Retention of urine, unspecified: Secondary | ICD-10-CM | POA: Diagnosis not present

## 2023-12-11 DIAGNOSIS — R2681 Unsteadiness on feet: Secondary | ICD-10-CM | POA: Diagnosis not present

## 2023-12-11 DIAGNOSIS — N189 Chronic kidney disease, unspecified: Secondary | ICD-10-CM | POA: Diagnosis not present

## 2023-12-15 DIAGNOSIS — I5020 Unspecified systolic (congestive) heart failure: Secondary | ICD-10-CM | POA: Diagnosis not present

## 2023-12-15 DIAGNOSIS — E871 Hypo-osmolality and hyponatremia: Secondary | ICD-10-CM | POA: Diagnosis not present

## 2023-12-15 DIAGNOSIS — E46 Unspecified protein-calorie malnutrition: Secondary | ICD-10-CM | POA: Diagnosis not present

## 2023-12-15 DIAGNOSIS — R2241 Localized swelling, mass and lump, right lower limb: Secondary | ICD-10-CM | POA: Diagnosis not present

## 2023-12-15 DIAGNOSIS — J9610 Chronic respiratory failure, unspecified whether with hypoxia or hypercapnia: Secondary | ICD-10-CM | POA: Diagnosis not present

## 2023-12-15 DIAGNOSIS — R627 Adult failure to thrive: Secondary | ICD-10-CM | POA: Diagnosis not present

## 2023-12-15 DIAGNOSIS — R2242 Localized swelling, mass and lump, left lower limb: Secondary | ICD-10-CM | POA: Diagnosis not present

## 2023-12-15 DIAGNOSIS — Z681 Body mass index (BMI) 19 or less, adult: Secondary | ICD-10-CM | POA: Diagnosis not present

## 2023-12-16 ENCOUNTER — Other Ambulatory Visit (HOSPITAL_COMMUNITY): Payer: Self-pay

## 2023-12-16 ENCOUNTER — Telehealth: Payer: Self-pay

## 2023-12-16 ENCOUNTER — Other Ambulatory Visit: Payer: Self-pay | Admitting: Student

## 2023-12-16 DIAGNOSIS — I5020 Unspecified systolic (congestive) heart failure: Secondary | ICD-10-CM | POA: Diagnosis not present

## 2023-12-16 DIAGNOSIS — I255 Ischemic cardiomyopathy: Secondary | ICD-10-CM

## 2023-12-16 DIAGNOSIS — I340 Nonrheumatic mitral (valve) insufficiency: Secondary | ICD-10-CM

## 2023-12-16 DIAGNOSIS — I6523 Occlusion and stenosis of bilateral carotid arteries: Secondary | ICD-10-CM

## 2023-12-16 DIAGNOSIS — I445 Left posterior fascicular block: Secondary | ICD-10-CM | POA: Diagnosis not present

## 2023-12-16 DIAGNOSIS — R079 Chest pain, unspecified: Secondary | ICD-10-CM | POA: Diagnosis not present

## 2023-12-16 DIAGNOSIS — I739 Peripheral vascular disease, unspecified: Secondary | ICD-10-CM

## 2023-12-16 DIAGNOSIS — E871 Hypo-osmolality and hyponatremia: Secondary | ICD-10-CM | POA: Diagnosis not present

## 2023-12-16 DIAGNOSIS — I071 Rheumatic tricuspid insufficiency: Secondary | ICD-10-CM

## 2023-12-16 DIAGNOSIS — I2510 Atherosclerotic heart disease of native coronary artery without angina pectoris: Secondary | ICD-10-CM

## 2023-12-16 DIAGNOSIS — E785 Hyperlipidemia, unspecified: Secondary | ICD-10-CM

## 2023-12-16 DIAGNOSIS — I10 Essential (primary) hypertension: Secondary | ICD-10-CM

## 2023-12-16 DIAGNOSIS — I351 Nonrheumatic aortic (valve) insufficiency: Secondary | ICD-10-CM

## 2023-12-16 DIAGNOSIS — Z7409 Other reduced mobility: Secondary | ICD-10-CM | POA: Diagnosis not present

## 2023-12-16 NOTE — Telephone Encounter (Signed)
 Received voicemail from pt that she is having trouble refilling her Nucala . Called pt back and she stated the pharmacy is attempting to run her [employer] Medicare plan instead of her commercial plan through [employer]. Per eligibility only Medicare comes up and commercial is terminated.  Reached out to [facility] to confirm issue. They are also getting coverage terminated for her commercial. We ran into this issue last month and they patient had to get her insurance reactivated. Per my test claim when ran through [employer] copay is $200. [facility]ided [facility] with [facility]'s Medicare processing info to see if they're able to at least run that but they were having trouble inputting the info. Was transferred to benefits team to get info put in correctly. That agent was having trouble as well and stated he would call me back when it was resolved.  Called the pt to advise her her [employer] was terminated once again and she needs to contact them to see what is going on. Provided her the phone number from her insurance card and asked if she can have them at least cover her until the end of the year. Not sure why her insurance has terminated the last couple of months. Pt is currently in the hospital but stated she will reach out to them. Advised her to call me when she has an update.

## 2023-12-17 DIAGNOSIS — J449 Chronic obstructive pulmonary disease, unspecified: Secondary | ICD-10-CM | POA: Diagnosis not present

## 2023-12-17 DIAGNOSIS — E871 Hypo-osmolality and hyponatremia: Secondary | ICD-10-CM | POA: Diagnosis not present

## 2023-12-17 DIAGNOSIS — I10 Essential (primary) hypertension: Secondary | ICD-10-CM | POA: Diagnosis not present

## 2023-12-17 DIAGNOSIS — I2510 Atherosclerotic heart disease of native coronary artery without angina pectoris: Secondary | ICD-10-CM | POA: Diagnosis not present

## 2023-12-17 MED ORDER — NITROGLYCERIN 0.2 MG/HR TD PT24: 0.2000 mg | MEDICATED_PATCH | Freq: Every day | TRANSDERMAL | 7 refills | Status: AC

## 2023-12-17 NOTE — Telephone Encounter (Signed)
 Appointment canceled

## 2023-12-17 NOTE — Telephone Encounter (Signed)
 Received call back from pt. She did not contact [employer] but believes she will be forced to switch to Medicare going forward. Copay is currently unaffordable to her and will need to pursue PAP. Will connect with pt to complete paperwork. She is still hospitalized and will not be able to make her appointment tomorrow. Will notify admin team.

## 2023-12-18 ENCOUNTER — Ambulatory Visit: Admitting: Pulmonary Disease

## 2023-12-18 DIAGNOSIS — I10 Essential (primary) hypertension: Secondary | ICD-10-CM | POA: Diagnosis not present

## 2023-12-18 DIAGNOSIS — I2510 Atherosclerotic heart disease of native coronary artery without angina pectoris: Secondary | ICD-10-CM | POA: Diagnosis not present

## 2023-12-18 DIAGNOSIS — J449 Chronic obstructive pulmonary disease, unspecified: Secondary | ICD-10-CM | POA: Diagnosis not present

## 2023-12-18 DIAGNOSIS — E222 Syndrome of inappropriate secretion of antidiuretic hormone: Secondary | ICD-10-CM | POA: Diagnosis not present

## 2023-12-18 DIAGNOSIS — E871 Hypo-osmolality and hyponatremia: Secondary | ICD-10-CM | POA: Diagnosis not present

## 2023-12-19 DIAGNOSIS — E871 Hypo-osmolality and hyponatremia: Secondary | ICD-10-CM | POA: Diagnosis not present

## 2023-12-19 DIAGNOSIS — I2510 Atherosclerotic heart disease of native coronary artery without angina pectoris: Secondary | ICD-10-CM | POA: Diagnosis not present

## 2023-12-19 DIAGNOSIS — I959 Hypotension, unspecified: Secondary | ICD-10-CM | POA: Diagnosis not present

## 2023-12-19 DIAGNOSIS — J449 Chronic obstructive pulmonary disease, unspecified: Secondary | ICD-10-CM | POA: Diagnosis not present

## 2023-12-20 DIAGNOSIS — E871 Hypo-osmolality and hyponatremia: Secondary | ICD-10-CM | POA: Diagnosis not present

## 2023-12-20 DIAGNOSIS — I959 Hypotension, unspecified: Secondary | ICD-10-CM | POA: Diagnosis not present

## 2023-12-21 DIAGNOSIS — E871 Hypo-osmolality and hyponatremia: Secondary | ICD-10-CM | POA: Diagnosis not present

## 2023-12-21 DIAGNOSIS — I959 Hypotension, unspecified: Secondary | ICD-10-CM | POA: Diagnosis not present

## 2023-12-22 ENCOUNTER — Telehealth: Payer: Self-pay

## 2023-12-22 ENCOUNTER — Other Ambulatory Visit (HOSPITAL_COMMUNITY): Payer: Self-pay

## 2023-12-22 DIAGNOSIS — E875 Hyperkalemia: Secondary | ICD-10-CM | POA: Diagnosis not present

## 2023-12-22 DIAGNOSIS — I5020 Unspecified systolic (congestive) heart failure: Secondary | ICD-10-CM | POA: Diagnosis not present

## 2023-12-22 DIAGNOSIS — I272 Pulmonary hypertension, unspecified: Secondary | ICD-10-CM | POA: Diagnosis not present

## 2023-12-22 DIAGNOSIS — R339 Retention of urine, unspecified: Secondary | ICD-10-CM | POA: Diagnosis not present

## 2023-12-22 DIAGNOSIS — E871 Hypo-osmolality and hyponatremia: Secondary | ICD-10-CM | POA: Diagnosis not present

## 2023-12-22 NOTE — Telephone Encounter (Signed)
 Pt enrolled in Asthma grant through [employer]:  Amount: $2000 Award Period: 06/25/2023 - 12/21/2024 BIN: [id number] PCN: [id number] Group: [id number] ID: [id number]  For pharmacy inquiries, contact PDMI at [phone]. For patient inquiries, contact [employer] at [phone].

## 2023-12-22 NOTE — Telephone Encounter (Signed)
 Received fax from [employer] stating pt was provided courtesy fill of Nucala  but will require prior authorization. Attempted to submit through [facility] but it was cancelled stating request is not able to be processed through [employer]. Will call [facility] and submit over the phone.

## 2023-12-22 NOTE — Telephone Encounter (Signed)
 Called [facility] and provided them with grant info. They are unable to confirm the pt's copay but stated they don't think she should have anything to worry about.  Called the pt and advised her she was enrolled in a grant and shouldn't have any copay for her Nucala  for now. Advised her when it expires or when the funds are exhausted we will have to look into reapplying if the grant is still open. Advised her [facility] can call [facility] to schedule the shipment of her medication when she is ready. She is still currently hospitalized. Advised her when she takes her dose she will not take her next one until 4 weeks after the day she took it. She stated she will [facility] and request [facility] refill. Advised her to reach out to me if she has any questions or issues.

## 2023-12-23 ENCOUNTER — Other Ambulatory Visit (HOSPITAL_COMMUNITY): Payer: Self-pay

## 2023-12-23 DIAGNOSIS — J449 Chronic obstructive pulmonary disease, unspecified: Secondary | ICD-10-CM | POA: Diagnosis not present

## 2023-12-23 DIAGNOSIS — E875 Hyperkalemia: Secondary | ICD-10-CM | POA: Diagnosis not present

## 2023-12-23 DIAGNOSIS — I517 Cardiomegaly: Secondary | ICD-10-CM | POA: Diagnosis not present

## 2023-12-23 DIAGNOSIS — R339 Retention of urine, unspecified: Secondary | ICD-10-CM | POA: Diagnosis not present

## 2023-12-23 DIAGNOSIS — E871 Hypo-osmolality and hyponatremia: Secondary | ICD-10-CM | POA: Diagnosis not present

## 2023-12-23 NOTE — Telephone Encounter (Signed)
 Called [facility] to submit pa but had to be transferred to [facility] Medicare line to be connected to right department. Per rep pt's transition fill extends through March 2026 so she has coverage of Nucala  until April.  Rep will fax pa form, can submit now and indicate on form that it is for 2026. Will complete form once received.  Phone #: [phone]

## 2023-12-24 DIAGNOSIS — E871 Hypo-osmolality and hyponatremia: Secondary | ICD-10-CM | POA: Diagnosis not present

## 2023-12-24 DIAGNOSIS — E875 Hyperkalemia: Secondary | ICD-10-CM | POA: Diagnosis not present

## 2023-12-24 DIAGNOSIS — R339 Retention of urine, unspecified: Secondary | ICD-10-CM | POA: Diagnosis not present

## 2023-12-24 DIAGNOSIS — J449 Chronic obstructive pulmonary disease, unspecified: Secondary | ICD-10-CM | POA: Diagnosis not present

## 2023-12-25 DIAGNOSIS — E222 Syndrome of inappropriate secretion of antidiuretic hormone: Secondary | ICD-10-CM | POA: Diagnosis not present

## 2023-12-25 DIAGNOSIS — E871 Hypo-osmolality and hyponatremia: Secondary | ICD-10-CM | POA: Diagnosis not present

## 2023-12-25 DIAGNOSIS — E875 Hyperkalemia: Secondary | ICD-10-CM | POA: Diagnosis not present

## 2023-12-26 DIAGNOSIS — E43 Unspecified severe protein-calorie malnutrition: Secondary | ICD-10-CM | POA: Diagnosis not present

## 2023-12-26 DIAGNOSIS — E871 Hypo-osmolality and hyponatremia: Secondary | ICD-10-CM | POA: Diagnosis not present

## 2023-12-26 DIAGNOSIS — E875 Hyperkalemia: Secondary | ICD-10-CM | POA: Diagnosis not present

## 2023-12-26 DIAGNOSIS — I5022 Chronic systolic (congestive) heart failure: Secondary | ICD-10-CM | POA: Diagnosis not present

## 2023-12-26 DIAGNOSIS — E222 Syndrome of inappropriate secretion of antidiuretic hormone: Secondary | ICD-10-CM | POA: Diagnosis not present

## 2023-12-26 DIAGNOSIS — R0981 Nasal congestion: Secondary | ICD-10-CM | POA: Diagnosis not present

## 2023-12-27 DIAGNOSIS — I5022 Chronic systolic (congestive) heart failure: Secondary | ICD-10-CM | POA: Diagnosis not present

## 2023-12-27 DIAGNOSIS — E875 Hyperkalemia: Secondary | ICD-10-CM | POA: Diagnosis not present

## 2023-12-27 DIAGNOSIS — E222 Syndrome of inappropriate secretion of antidiuretic hormone: Secondary | ICD-10-CM | POA: Diagnosis not present

## 2023-12-27 DIAGNOSIS — E43 Unspecified severe protein-calorie malnutrition: Secondary | ICD-10-CM | POA: Diagnosis not present

## 2023-12-27 DIAGNOSIS — E871 Hypo-osmolality and hyponatremia: Secondary | ICD-10-CM | POA: Diagnosis not present

## 2023-12-28 ENCOUNTER — Other Ambulatory Visit: Payer: Self-pay | Admitting: Internal Medicine

## 2023-12-28 DIAGNOSIS — I5022 Chronic systolic (congestive) heart failure: Secondary | ICD-10-CM | POA: Diagnosis not present

## 2023-12-28 DIAGNOSIS — E875 Hyperkalemia: Secondary | ICD-10-CM | POA: Diagnosis not present

## 2023-12-28 DIAGNOSIS — J449 Chronic obstructive pulmonary disease, unspecified: Secondary | ICD-10-CM | POA: Diagnosis not present

## 2023-12-28 DIAGNOSIS — E43 Unspecified severe protein-calorie malnutrition: Secondary | ICD-10-CM | POA: Diagnosis not present

## 2023-12-28 DIAGNOSIS — R0602 Shortness of breath: Secondary | ICD-10-CM | POA: Diagnosis not present

## 2023-12-28 DIAGNOSIS — E871 Hypo-osmolality and hyponatremia: Secondary | ICD-10-CM | POA: Diagnosis not present

## 2023-12-29 DIAGNOSIS — J449 Chronic obstructive pulmonary disease, unspecified: Secondary | ICD-10-CM | POA: Diagnosis not present

## 2023-12-29 DIAGNOSIS — I272 Pulmonary hypertension, unspecified: Secondary | ICD-10-CM | POA: Diagnosis not present

## 2023-12-29 DIAGNOSIS — J9611 Chronic respiratory failure with hypoxia: Secondary | ICD-10-CM | POA: Diagnosis not present

## 2023-12-29 DIAGNOSIS — I5020 Unspecified systolic (congestive) heart failure: Secondary | ICD-10-CM | POA: Diagnosis not present

## 2023-12-29 DIAGNOSIS — I5022 Chronic systolic (congestive) heart failure: Secondary | ICD-10-CM | POA: Diagnosis not present

## 2023-12-29 DIAGNOSIS — E43 Unspecified severe protein-calorie malnutrition: Secondary | ICD-10-CM | POA: Diagnosis not present

## 2023-12-29 DIAGNOSIS — E875 Hyperkalemia: Secondary | ICD-10-CM | POA: Diagnosis not present

## 2023-12-29 DIAGNOSIS — E871 Hypo-osmolality and hyponatremia: Secondary | ICD-10-CM | POA: Diagnosis not present

## 2023-12-29 DIAGNOSIS — Z7409 Other reduced mobility: Secondary | ICD-10-CM | POA: Diagnosis not present

## 2023-12-29 DIAGNOSIS — J9602 Acute respiratory failure with hypercapnia: Secondary | ICD-10-CM | POA: Diagnosis not present

## 2023-12-29 DIAGNOSIS — Z515 Encounter for palliative care: Secondary | ICD-10-CM | POA: Diagnosis not present

## 2023-12-29 DIAGNOSIS — R0602 Shortness of breath: Secondary | ICD-10-CM | POA: Diagnosis not present

## 2023-12-29 DIAGNOSIS — Z789 Other specified health status: Secondary | ICD-10-CM | POA: Diagnosis not present

## 2023-12-30 DIAGNOSIS — E871 Hypo-osmolality and hyponatremia: Secondary | ICD-10-CM | POA: Diagnosis not present

## 2023-12-30 DIAGNOSIS — E875 Hyperkalemia: Secondary | ICD-10-CM | POA: Diagnosis not present

## 2023-12-30 DIAGNOSIS — J449 Chronic obstructive pulmonary disease, unspecified: Secondary | ICD-10-CM | POA: Diagnosis not present

## 2023-12-30 DIAGNOSIS — R0602 Shortness of breath: Secondary | ICD-10-CM | POA: Diagnosis not present

## 2023-12-31 DIAGNOSIS — E871 Hypo-osmolality and hyponatremia: Secondary | ICD-10-CM | POA: Diagnosis not present

## 2023-12-31 DIAGNOSIS — J449 Chronic obstructive pulmonary disease, unspecified: Secondary | ICD-10-CM | POA: Diagnosis not present

## 2023-12-31 DIAGNOSIS — E875 Hyperkalemia: Secondary | ICD-10-CM | POA: Diagnosis not present

## 2024-01-01 DIAGNOSIS — E871 Hypo-osmolality and hyponatremia: Secondary | ICD-10-CM | POA: Diagnosis not present

## 2024-01-02 DIAGNOSIS — E871 Hypo-osmolality and hyponatremia: Secondary | ICD-10-CM | POA: Diagnosis not present

## 2024-01-03 DIAGNOSIS — E871 Hypo-osmolality and hyponatremia: Secondary | ICD-10-CM | POA: Diagnosis not present

## 2024-01-04 DIAGNOSIS — E871 Hypo-osmolality and hyponatremia: Secondary | ICD-10-CM | POA: Diagnosis not present

## 2024-01-06 DIAGNOSIS — I272 Pulmonary hypertension, unspecified: Secondary | ICD-10-CM | POA: Diagnosis not present

## 2024-01-06 DIAGNOSIS — Z9981 Dependence on supplemental oxygen: Secondary | ICD-10-CM | POA: Diagnosis not present

## 2024-01-06 DIAGNOSIS — R000 Tachycardia, unspecified: Secondary | ICD-10-CM | POA: Diagnosis not present

## 2024-01-06 DIAGNOSIS — I9589 Other hypotension: Secondary | ICD-10-CM | POA: Diagnosis not present

## 2024-01-06 DIAGNOSIS — J9622 Acute and chronic respiratory failure with hypercapnia: Secondary | ICD-10-CM | POA: Diagnosis not present

## 2024-01-06 DIAGNOSIS — D509 Iron deficiency anemia, unspecified: Secondary | ICD-10-CM | POA: Diagnosis not present

## 2024-01-06 DIAGNOSIS — R339 Retention of urine, unspecified: Secondary | ICD-10-CM | POA: Diagnosis not present

## 2024-01-06 DIAGNOSIS — I5020 Unspecified systolic (congestive) heart failure: Secondary | ICD-10-CM | POA: Diagnosis not present

## 2024-01-06 DIAGNOSIS — J449 Chronic obstructive pulmonary disease, unspecified: Secondary | ICD-10-CM | POA: Diagnosis not present

## 2024-01-06 DIAGNOSIS — J9621 Acute and chronic respiratory failure with hypoxia: Secondary | ICD-10-CM | POA: Diagnosis not present

## 2024-01-06 DIAGNOSIS — E871 Hypo-osmolality and hyponatremia: Secondary | ICD-10-CM | POA: Diagnosis not present

## 2024-01-07 DIAGNOSIS — I480 Paroxysmal atrial fibrillation: Secondary | ICD-10-CM | POA: Diagnosis not present

## 2024-01-07 DIAGNOSIS — A411 Sepsis due to other specified staphylococcus: Secondary | ICD-10-CM | POA: Diagnosis not present

## 2024-01-07 DIAGNOSIS — J9611 Chronic respiratory failure with hypoxia: Secondary | ICD-10-CM | POA: Diagnosis not present

## 2024-01-07 DIAGNOSIS — B952 Enterococcus as the cause of diseases classified elsewhere: Secondary | ICD-10-CM | POA: Diagnosis not present

## 2024-01-07 DIAGNOSIS — I5022 Chronic systolic (congestive) heart failure: Secondary | ICD-10-CM | POA: Diagnosis not present

## 2024-01-07 DIAGNOSIS — R7881 Bacteremia: Secondary | ICD-10-CM | POA: Diagnosis not present

## 2024-01-07 DIAGNOSIS — I517 Cardiomegaly: Secondary | ICD-10-CM | POA: Diagnosis not present

## 2024-01-07 DIAGNOSIS — N3000 Acute cystitis without hematuria: Secondary | ICD-10-CM | POA: Diagnosis not present

## 2024-01-07 DIAGNOSIS — I083 Combined rheumatic disorders of mitral, aortic and tricuspid valves: Secondary | ICD-10-CM | POA: Diagnosis not present

## 2024-01-07 DIAGNOSIS — I5189 Other ill-defined heart diseases: Secondary | ICD-10-CM | POA: Diagnosis not present

## 2024-01-07 DIAGNOSIS — E871 Hypo-osmolality and hyponatremia: Secondary | ICD-10-CM | POA: Diagnosis not present

## 2024-01-07 DIAGNOSIS — E43 Unspecified severe protein-calorie malnutrition: Secondary | ICD-10-CM | POA: Diagnosis not present

## 2024-01-07 DIAGNOSIS — I878 Other specified disorders of veins: Secondary | ICD-10-CM | POA: Diagnosis not present

## 2024-01-07 DIAGNOSIS — J9621 Acute and chronic respiratory failure with hypoxia: Secondary | ICD-10-CM | POA: Diagnosis not present

## 2024-01-07 DIAGNOSIS — B957 Other staphylococcus as the cause of diseases classified elsewhere: Secondary | ICD-10-CM | POA: Diagnosis not present

## 2024-01-08 DIAGNOSIS — I5022 Chronic systolic (congestive) heart failure: Secondary | ICD-10-CM | POA: Diagnosis not present

## 2024-01-08 DIAGNOSIS — Z792 Long term (current) use of antibiotics: Secondary | ICD-10-CM | POA: Diagnosis not present

## 2024-01-08 DIAGNOSIS — J9621 Acute and chronic respiratory failure with hypoxia: Secondary | ICD-10-CM | POA: Diagnosis not present

## 2024-01-08 DIAGNOSIS — N390 Urinary tract infection, site not specified: Secondary | ICD-10-CM | POA: Diagnosis not present

## 2024-01-08 DIAGNOSIS — E871 Hypo-osmolality and hyponatremia: Secondary | ICD-10-CM | POA: Diagnosis not present

## 2024-01-08 DIAGNOSIS — B957 Other staphylococcus as the cause of diseases classified elsewhere: Secondary | ICD-10-CM | POA: Diagnosis not present

## 2024-01-08 DIAGNOSIS — B952 Enterococcus as the cause of diseases classified elsewhere: Secondary | ICD-10-CM | POA: Diagnosis not present

## 2024-01-08 DIAGNOSIS — R7881 Bacteremia: Secondary | ICD-10-CM | POA: Diagnosis not present

## 2024-01-08 DIAGNOSIS — E43 Unspecified severe protein-calorie malnutrition: Secondary | ICD-10-CM | POA: Diagnosis not present

## 2024-01-08 DIAGNOSIS — S31000A Unspecified open wound of lower back and pelvis without penetration into retroperitoneum, initial encounter: Secondary | ICD-10-CM | POA: Diagnosis not present

## 2024-01-09 DIAGNOSIS — I5022 Chronic systolic (congestive) heart failure: Secondary | ICD-10-CM | POA: Diagnosis not present

## 2024-01-09 DIAGNOSIS — R7881 Bacteremia: Secondary | ICD-10-CM | POA: Diagnosis not present

## 2024-01-09 DIAGNOSIS — Z792 Long term (current) use of antibiotics: Secondary | ICD-10-CM | POA: Diagnosis not present

## 2024-01-09 DIAGNOSIS — A411 Sepsis due to other specified staphylococcus: Secondary | ICD-10-CM | POA: Diagnosis not present

## 2024-01-09 DIAGNOSIS — B952 Enterococcus as the cause of diseases classified elsewhere: Secondary | ICD-10-CM | POA: Diagnosis not present

## 2024-01-09 DIAGNOSIS — E43 Unspecified severe protein-calorie malnutrition: Secondary | ICD-10-CM | POA: Diagnosis not present

## 2024-01-09 DIAGNOSIS — J9621 Acute and chronic respiratory failure with hypoxia: Secondary | ICD-10-CM | POA: Diagnosis not present

## 2024-01-09 DIAGNOSIS — J9611 Chronic respiratory failure with hypoxia: Secondary | ICD-10-CM | POA: Diagnosis not present

## 2024-01-09 DIAGNOSIS — S31000A Unspecified open wound of lower back and pelvis without penetration into retroperitoneum, initial encounter: Secondary | ICD-10-CM | POA: Diagnosis not present

## 2024-01-09 DIAGNOSIS — B957 Other staphylococcus as the cause of diseases classified elsewhere: Secondary | ICD-10-CM | POA: Diagnosis not present

## 2024-01-09 DIAGNOSIS — I480 Paroxysmal atrial fibrillation: Secondary | ICD-10-CM | POA: Diagnosis not present

## 2024-01-09 DIAGNOSIS — N390 Urinary tract infection, site not specified: Secondary | ICD-10-CM | POA: Diagnosis not present

## 2024-01-09 DIAGNOSIS — E871 Hypo-osmolality and hyponatremia: Secondary | ICD-10-CM | POA: Diagnosis not present

## 2024-01-10 DIAGNOSIS — E43 Unspecified severe protein-calorie malnutrition: Secondary | ICD-10-CM | POA: Diagnosis not present

## 2024-01-10 DIAGNOSIS — I5022 Chronic systolic (congestive) heart failure: Secondary | ICD-10-CM | POA: Diagnosis not present

## 2024-01-10 DIAGNOSIS — A411 Sepsis due to other specified staphylococcus: Secondary | ICD-10-CM | POA: Diagnosis not present

## 2024-01-10 DIAGNOSIS — E871 Hypo-osmolality and hyponatremia: Secondary | ICD-10-CM | POA: Diagnosis not present

## 2024-01-10 DIAGNOSIS — I480 Paroxysmal atrial fibrillation: Secondary | ICD-10-CM | POA: Diagnosis not present

## 2024-01-10 DIAGNOSIS — J9611 Chronic respiratory failure with hypoxia: Secondary | ICD-10-CM | POA: Diagnosis not present

## 2024-01-11 DIAGNOSIS — I5022 Chronic systolic (congestive) heart failure: Secondary | ICD-10-CM | POA: Diagnosis not present

## 2024-01-11 DIAGNOSIS — I480 Paroxysmal atrial fibrillation: Secondary | ICD-10-CM | POA: Diagnosis not present

## 2024-01-11 DIAGNOSIS — J9611 Chronic respiratory failure with hypoxia: Secondary | ICD-10-CM | POA: Diagnosis not present

## 2024-01-11 DIAGNOSIS — E43 Unspecified severe protein-calorie malnutrition: Secondary | ICD-10-CM | POA: Diagnosis not present

## 2024-01-11 DIAGNOSIS — A411 Sepsis due to other specified staphylococcus: Secondary | ICD-10-CM | POA: Diagnosis not present

## 2024-01-11 DIAGNOSIS — E871 Hypo-osmolality and hyponatremia: Secondary | ICD-10-CM | POA: Diagnosis not present

## 2024-01-12 DIAGNOSIS — R7881 Bacteremia: Secondary | ICD-10-CM | POA: Diagnosis not present

## 2024-01-12 DIAGNOSIS — N390 Urinary tract infection, site not specified: Secondary | ICD-10-CM | POA: Diagnosis not present

## 2024-01-12 DIAGNOSIS — B957 Other staphylococcus as the cause of diseases classified elsewhere: Secondary | ICD-10-CM | POA: Diagnosis not present

## 2024-01-12 DIAGNOSIS — J9621 Acute and chronic respiratory failure with hypoxia: Secondary | ICD-10-CM | POA: Diagnosis not present

## 2024-01-12 DIAGNOSIS — B952 Enterococcus as the cause of diseases classified elsewhere: Secondary | ICD-10-CM | POA: Diagnosis not present

## 2024-01-20 ENCOUNTER — Inpatient Hospital Stay: Admitting: Adult Health

## 2024-02-03 ENCOUNTER — Inpatient Hospital Stay: Admitting: Adult Health

## 2024-02-03 ENCOUNTER — Ambulatory Visit: Admitting: Adult Health

## 2024-02-03 ENCOUNTER — Encounter: Payer: Self-pay | Admitting: Adult Health

## 2024-02-03 VITALS — BP 132/80 | HR 85 | Temp 97.9°F | Ht 64.0 in | Wt 85.5 lb

## 2024-02-03 DIAGNOSIS — J439 Emphysema, unspecified: Secondary | ICD-10-CM

## 2024-02-03 DIAGNOSIS — I4891 Unspecified atrial fibrillation: Secondary | ICD-10-CM

## 2024-02-03 DIAGNOSIS — J449 Chronic obstructive pulmonary disease, unspecified: Secondary | ICD-10-CM

## 2024-02-03 DIAGNOSIS — Z87891 Personal history of nicotine dependence: Secondary | ICD-10-CM | POA: Diagnosis not present

## 2024-02-03 DIAGNOSIS — I509 Heart failure, unspecified: Secondary | ICD-10-CM

## 2024-02-03 DIAGNOSIS — R0689 Other abnormalities of breathing: Secondary | ICD-10-CM

## 2024-02-03 DIAGNOSIS — R040 Epistaxis: Secondary | ICD-10-CM

## 2024-02-03 DIAGNOSIS — R7881 Bacteremia: Secondary | ICD-10-CM

## 2024-02-03 DIAGNOSIS — J9611 Chronic respiratory failure with hypoxia: Secondary | ICD-10-CM | POA: Diagnosis not present

## 2024-02-03 DIAGNOSIS — K117 Disturbances of salivary secretion: Secondary | ICD-10-CM

## 2024-02-03 DIAGNOSIS — E43 Unspecified severe protein-calorie malnutrition: Secondary | ICD-10-CM | POA: Diagnosis not present

## 2024-02-03 DIAGNOSIS — E871 Hypo-osmolality and hyponatremia: Secondary | ICD-10-CM

## 2024-02-03 DIAGNOSIS — J9612 Chronic respiratory failure with hypercapnia: Secondary | ICD-10-CM

## 2024-02-03 DIAGNOSIS — I5022 Chronic systolic (congestive) heart failure: Secondary | ICD-10-CM

## 2024-02-03 DIAGNOSIS — I480 Paroxysmal atrial fibrillation: Secondary | ICD-10-CM
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
## 2024-02-03 NOTE — Patient Instructions (Signed)
 Prednisone  20mg  daily for 5 days  BIPAP download requested  Continue on BIPAP with Oxygen  at 5l/m At bedtime  and naps. Continue Nucala  injections  Continue trelegy 200mcg 1 puff daily - rinse mouth out after each use  Use albuterol  inhaler 1-2 puffs or Duoneb every 4-6 hours as needed  Continue montelukast  10mg  daily  Continue azithromycin  1 tab, 3 days per week  Continue supplemental oxygen  to maintain O2 saturations 88% or higher  Continue on follow up with Cardiology.   Follow up in 4 weeks with Dr. Kara , call sooner if needed
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
## 2024-02-12 ENCOUNTER — Telehealth: Payer: Self-pay

## 2024-02-12 NOTE — Telephone Encounter (Signed)
 Received call from Asberry, pharmacist with [facility], asking about appropriate delivery date for Nucala .   They received a re-order form from RN, Glenys Pouch, yesterday on 02/11/24 for Nucala . The next administration date was listed as 02/21/24.   Asberry asks if the patient administers in office or at home. Asberry with [facility] called [facility] because she thought it was administered at [facility] based on the re-order form submitted by RN, Glenys Pouch, yesterday.  I do not see chart documentation reflecting that a re-order form was sent from our office. I do not know of a Glenys Pouch, RN, that works at our office.   Asberry with [facility] plans to call patient to coordinate delivery date of Nucala .   Called patient to clarify - Glenys is a nurse in her rehab facility who is coordinating delivery.   NFN.

## 2024-02-14 ENCOUNTER — Other Ambulatory Visit: Payer: Self-pay | Admitting: Cardiology

## 2024-02-16 ENCOUNTER — Telehealth: Payer: Self-pay

## 2024-02-16 NOTE — Telephone Encounter (Signed)
 Copied from CRM [id number]. Topic: Clinical - Request for Lab/Test Order >> Feb 16, 2024 10:37 AM Rilla NOVAK wrote: Reason for CRM: Bari from [facility] calling stating patient is preparing to go home but she needs a BiPap. States they were told she needed a sleep study. Please call Christy @ [phone].   Returned to [employer] - gave me another number to reach out to / called that number no answer [phone] tonya wilkins )

## 2024-02-17 ENCOUNTER — Telehealth: Payer: Self-pay

## 2024-02-17 NOTE — Telephone Encounter (Signed)
 Patient has visit with Tammy on 2/10. She needs to keep this appointment to be assessed for need of sleep study or bipap for chronic resp failure.  Thanks, JD

## 2024-02-17 NOTE — Telephone Encounter (Signed)
 Unfortunately it was a rental through the living / rehab facility  and they won't let her take it. But I will relay the message or if you would like me to try and get her in sooner ?

## 2024-02-17 NOTE — Telephone Encounter (Signed)
 Copied from CRM [id number]. Topic: General - Other >> Feb 16, 2024 10:24 AM Dedra NOVAK wrote: Reason for CRM: Barnie Sharps, social worker at [facility], is working on patient's discharge date. She needs to know if patient has ever had a sleep study. Please call Barnie at [phone].   I dont see where we have ever seen pt for sleep nor was a sleep study ever ordered. [facility] Deborah x1. LMTCB

## 2024-02-17 NOTE — Telephone Encounter (Signed)
 She was on BIPAP at OV this month, she should stay on this until next ov. We requested a BIPAP download to make sure setting were okay. She was started on this after recent prolonged hospitalization for A/C Hypercarbic/Hypoxic RF with recurrent admissions. Should be able to continue this at home with this Dx.   Let me know if we need an order sent to DME to continue before she is discharged. Typically SW/CM sets this up at SNF.   VBG 11/2023 showed pH7.2, PCO 81

## 2024-02-17 NOTE — Telephone Encounter (Signed)
 [facility]ached out to [facility] waiting on return call about patient Bipap    Per Dr. Kara -The indication for Bipap is for chronic hypercapnic respiratory failure due to COPD that is controlled. She was qualified for Bipap therapy at recent hospitalization. VBG 11/2023 showed pH7.2, PCO 81. We can update ABG if needed to show ongoing qualification but a sleep study is not needed to evaluate for sleep disordered breathing.   37 mins JD Date: 01/12/2024 Time: 2:05 PM   Patient Type: Inpatient  I spoke with Duwaine Avers RRT. These are the settings for patient's BIPAP IPAP = 15cm H2O EPAP = 5cm H2O O2 lpm or FiO2 = 4lpm/36%

## 2024-02-18 ENCOUNTER — Telehealth: Payer: Self-pay | Admitting: Pulmonary Disease

## 2024-02-18 ENCOUNTER — Other Ambulatory Visit: Payer: Self-pay

## 2024-02-18 DIAGNOSIS — J9611 Chronic respiratory failure with hypoxia: Secondary | ICD-10-CM

## 2024-02-18 NOTE — Telephone Encounter (Signed)
 This is already being addressed / will give social worker call to loop her in   -NFN

## 2024-02-18 NOTE — Telephone Encounter (Signed)
 Copied from CRM [id number]. Topic: General - Other >> Feb 17, 2024  1:15 PM Russell PARAS wrote: Reason for CRM:   Tonya, with [facility], is contacting clinic to speak with someone about additional forms that need to be completed prior to pt being discharged.   CB#  [phone], ask for Tonya

## 2024-02-19 NOTE — Telephone Encounter (Signed)
 Requirments for Bipap was emailed to [employer] per Mercy with [employer].   Copied from CRM [id number]. Topic: General - Other >> Feb 17, 2024 12:56 PM Russell PARAS wrote: Reason for CRM:   Angie, with [facility], is returning call from [place]. Contacted CAL, and was advised to send message, due to Lee being at lunch.  Angie requested call back   CB#  [phone](personal cell, is not currently in office at regular number)

## 2024-02-19 NOTE — Telephone Encounter (Signed)
 Order placed for bipap

## 2024-02-19 NOTE — Telephone Encounter (Signed)
 Tried to reach out to Tonya [phone]), if they need something filled out from us  fax to [phone] att [place]

## 2024-02-20 ENCOUNTER — Other Ambulatory Visit: Payer: Self-pay

## 2024-02-20 DIAGNOSIS — J449 Chronic obstructive pulmonary disease, unspecified: Secondary | ICD-10-CM

## 2024-02-20 DIAGNOSIS — J9611 Chronic respiratory failure with hypoxia: Secondary | ICD-10-CM

## 2024-02-20 NOTE — Telephone Encounter (Signed)
 Spoke with Mitch of [employer] - order ABG    Will reach out to angie again

## 2024-02-20 NOTE — Telephone Encounter (Signed)
 Tried to reach out back to angie - VMB full    Will try again

## 2024-02-24 ENCOUNTER — Ambulatory Visit: Admitting: Cardiology

## 2024-02-24 ENCOUNTER — Telehealth: Payer: Self-pay

## 2024-02-24 NOTE — Telephone Encounter (Signed)
 Copied from CRM [id number]. Topic: Clinical - Request for Lab/Test Order >> Feb 24, 2024 11:35 AM Corean SAUNDERS wrote: Reason for CRM: Tonya with [facility] states this patient will be discharged next week but will need an ABG first as soon as possible.  [facility] is requesting a call back so that this ABG can be placed at [phone]   [facility] w/ pt care team faxed the order that was placed

## 2024-03-03 ENCOUNTER — Other Ambulatory Visit: Payer: Self-pay | Admitting: Pulmonary Disease

## 2024-03-03 DIAGNOSIS — J449 Chronic obstructive pulmonary disease, unspecified: Secondary | ICD-10-CM

## 2024-03-09 ENCOUNTER — Ambulatory Visit: Admitting: Adult Health

## 2024-03-22 ENCOUNTER — Ambulatory Visit: Admitting: Pulmonary Disease

## 2024-05-27 ENCOUNTER — Ambulatory Visit: Admitting: Cardiology
